# Patient Record
Sex: Male | Born: 1960 | Race: White | Hispanic: No | Marital: Married | Smoking: Former smoker
Health system: Southern US, Community
[De-identification: ages and names within clinical notes are randomized; demographics above are authoritative.]

## PROBLEM LIST (undated history)

## (undated) DIAGNOSIS — K219 Gastro-esophageal reflux disease without esophagitis: Secondary | ICD-10-CM

## (undated) DIAGNOSIS — R112 Nausea with vomiting, unspecified: Secondary | ICD-10-CM

## (undated) DIAGNOSIS — Z9889 Other specified postprocedural states: Secondary | ICD-10-CM

## (undated) DIAGNOSIS — G4730 Sleep apnea, unspecified: Secondary | ICD-10-CM

## (undated) DIAGNOSIS — E079 Disorder of thyroid, unspecified: Secondary | ICD-10-CM

## (undated) DIAGNOSIS — Z86010 Personal history of colonic polyps: Secondary | ICD-10-CM

## (undated) DIAGNOSIS — H04123 Dry eye syndrome of bilateral lacrimal glands: Secondary | ICD-10-CM

## (undated) DIAGNOSIS — I10 Essential (primary) hypertension: Secondary | ICD-10-CM

## (undated) DIAGNOSIS — H409 Unspecified glaucoma: Secondary | ICD-10-CM

## (undated) DIAGNOSIS — T7840XA Allergy, unspecified, initial encounter: Secondary | ICD-10-CM

## (undated) HISTORY — DX: Other specified postprocedural states: Z98.890

## (undated) HISTORY — PX: THYROID SURGERY: SHX805

## (undated) HISTORY — DX: Disorder of thyroid, unspecified: E07.9

## (undated) HISTORY — PX: POLYPECTOMY: SHX149

## (undated) HISTORY — PX: COLONOSCOPY: SHX174

## (undated) HISTORY — PX: SHOULDER SURGERY: SHX246

## (undated) HISTORY — DX: Essential (primary) hypertension: I10

## (undated) HISTORY — PX: EYE SURGERY: SHX253

## (undated) HISTORY — DX: Personal history of colonic polyps: Z86.010

## (undated) HISTORY — DX: Sleep apnea, unspecified: G47.30

## (undated) HISTORY — PX: CHOLECYSTECTOMY: SHX55

## (undated) HISTORY — DX: Nausea with vomiting, unspecified: R11.2

## (undated) HISTORY — PX: INNER EAR SURGERY: SHX679

## (undated) HISTORY — DX: Allergy, unspecified, initial encounter: T78.40XA

---

## 2003-07-26 ENCOUNTER — Encounter: Payer: Self-pay | Admitting: Family Medicine

## 2003-07-26 ENCOUNTER — Ambulatory Visit (HOSPITAL_COMMUNITY): Admission: RE | Admit: 2003-07-26 | Discharge: 2003-07-26 | Payer: Self-pay | Admitting: Family Medicine

## 2003-07-31 ENCOUNTER — Encounter: Payer: Self-pay | Admitting: Endocrinology

## 2003-07-31 ENCOUNTER — Ambulatory Visit (HOSPITAL_COMMUNITY): Admission: RE | Admit: 2003-07-31 | Discharge: 2003-07-31 | Payer: Self-pay | Admitting: Endocrinology

## 2003-09-06 ENCOUNTER — Observation Stay (HOSPITAL_COMMUNITY): Admission: RE | Admit: 2003-09-06 | Discharge: 2003-09-07 | Payer: Self-pay | Admitting: General Surgery

## 2006-07-01 ENCOUNTER — Ambulatory Visit: Payer: Self-pay | Admitting: Internal Medicine

## 2006-07-08 ENCOUNTER — Ambulatory Visit: Payer: Self-pay | Admitting: Internal Medicine

## 2006-07-08 DIAGNOSIS — Z86010 Personal history of colonic polyps: Secondary | ICD-10-CM

## 2006-07-08 DIAGNOSIS — Z860101 Personal history of adenomatous and serrated colon polyps: Secondary | ICD-10-CM | POA: Insufficient documentation

## 2006-07-08 HISTORY — DX: Personal history of adenomatous and serrated colon polyps: Z86.0101

## 2006-07-08 HISTORY — DX: Personal history of colonic polyps: Z86.010

## 2011-06-26 ENCOUNTER — Encounter: Payer: Self-pay | Admitting: Internal Medicine

## 2012-09-05 ENCOUNTER — Encounter: Payer: Self-pay | Admitting: Internal Medicine

## 2014-09-07 ENCOUNTER — Ambulatory Visit (INDEPENDENT_AMBULATORY_CARE_PROVIDER_SITE_OTHER): Payer: BC Managed Care – PPO | Admitting: Surgery

## 2014-10-11 ENCOUNTER — Ambulatory Visit (INDEPENDENT_AMBULATORY_CARE_PROVIDER_SITE_OTHER): Payer: BC Managed Care – PPO | Admitting: Surgery

## 2014-10-11 ENCOUNTER — Other Ambulatory Visit (INDEPENDENT_AMBULATORY_CARE_PROVIDER_SITE_OTHER): Payer: Self-pay | Admitting: Surgery

## 2014-10-24 ENCOUNTER — Encounter: Payer: BC Managed Care – PPO | Attending: Surgery | Admitting: Dietician

## 2014-10-24 VITALS — Wt 291.2 lb

## 2014-10-24 DIAGNOSIS — Z713 Dietary counseling and surveillance: Secondary | ICD-10-CM | POA: Insufficient documentation

## 2014-10-24 DIAGNOSIS — E669 Obesity, unspecified: Secondary | ICD-10-CM | POA: Insufficient documentation

## 2014-10-24 NOTE — Progress Notes (Signed)
  Supervised weight loss:  Appt start time: 210 end time:  225  SWL visit 1:  Primary concerns today:  Hayden Mcbride is here today for his 1st SWL visit out of 6 in preparation for bariatric surgery. The pre op goals and criteria for protein shakes was reviewed during the appointment today. The patient had several questions regarding preparing for surgery. He agrees to the following recommendations:  Plan: -Get into the habit of eating breakfast -Try some protein shakes -Review pre op goals  Weight: 291 lbs   MEDICATIONS: see list  Estimated energy needs: 1800-2000 calories  Progress Towards Goal(s):  In progress.   Nutritional Diagnosis:  -3.3 Overweight/obesity related to past poor dietary habits and physical inactivity as evidenced by patient in SWL for pending bariatric surgery following dietary guidelines for continued weight loss.     Intervention:  Nutrition counseling provided.  Handouts given during visit include:  Pre op goals  Protein shakes  Monitoring/Evaluation:  Dietary intake, exercise, and body weight in 4 week(s).

## 2014-10-24 NOTE — Patient Instructions (Signed)
-  Get into the habit of eating breakfast -Try some protein shakes -Review pre op goals

## 2014-11-17 ENCOUNTER — Encounter: Payer: Self-pay | Admitting: Dietician

## 2014-11-17 ENCOUNTER — Encounter: Payer: BC Managed Care – PPO | Attending: Surgery | Admitting: Dietician

## 2014-11-17 DIAGNOSIS — Z713 Dietary counseling and surveillance: Secondary | ICD-10-CM | POA: Diagnosis not present

## 2014-11-17 DIAGNOSIS — E669 Obesity, unspecified: Secondary | ICD-10-CM | POA: Insufficient documentation

## 2014-11-17 NOTE — Progress Notes (Signed)
  Pre-Op Assessment Visit:  Pre-Operative Sleeve or RYGB Surgery  Medical Nutrition Therapy:  Appt start time: 5643   End time:  1400.  Patient was seen on 11/17/2014 for Pre-Operative Nutrition Assessment. Assessment and letter of approval faxed to [facility] Bariatric Surgery Program coordinator on 11/17/2014.   Preferred Learning Style:   No preference indicated   Learning Readiness:   Contemplating  Handouts given during visit include:  Pre-Op Goals Bariatric Surgery Protein Shakes   During the appointment today the following Pre-Op Goals were reviewed with the patient: Maintain or lose weight as instructed by your surgeon Make healthy food choices Begin to limit portion sizes Limited concentrated sugars and fried foods Keep fat/sugar in the single digits per serving on   food labels Practice CHEWING your food  (aim for 30 chews per bite or until applesauce consistency) Practice not drinking 15 minutes before, during, and 30 minutes after each meal/snack Avoid all carbonated beverages  Avoid/limit caffeinated beverages  Avoid all sugar-sweetened beverages Consume 3 meals per day; eat every 3-5 hours Make a list of non-food related activities Aim for 64-100 ounces of FLUID daily  Aim for at least 60-80 grams of PROTEIN daily Look for a liquid protein source that contain ?15 g protein and ?5 g carbohydrate  (ex: shakes, drinks, shots)  Patient-Centered Goals: Kia would like to buy shirts that he likes in his size.  Zygmunt would like to be able to fit more comfortably in an airplane seat and be able to put the tray table down.   Mohmmad would like to be able to walk without feeling winded.   Branton feels that the Pre-Op goals are of a 3-4 level of importance today. He believes that he will find them more important as he gets closer to surgery. Carlis feels that about a level of 8 in confidence to be able to meet the Pre-Op goals.  Demonstrated degree of  understanding via:  Teach Back  Teaching Method Utilized:  Visual Auditory Hands on  Barriers to learning/adherence to lifestyle change: struggling to feel motivated to make changes to diet/lifestyle  Patient to [facility] the [facility] to enroll in Pre-Op and Post-Op Nutrition Education when surgery date is scheduled.

## 2014-11-17 NOTE — Patient Instructions (Signed)
Follow Pre-Op Goals Try Protein Shakes Attend 5 more months of supervised weight loss appointments.  Things to remember:  Please always be honest with [place]. We want to support you!  If you have any questions or concerns in between appointments, please call or email Ferol Luz, or Margarita Grizzle.  The diet after surgery will be high protein and low in carbohydrate.  Vitamins and calcium need to be taken for the rest of your life.  Feel free to include support people in any classes or appointments.

## 2014-11-21 ENCOUNTER — Encounter: Payer: BC Managed Care – PPO | Admitting: Dietician

## 2014-11-21 VITALS — Ht 72.0 in | Wt 292.0 lb

## 2014-11-21 DIAGNOSIS — E669 Obesity, unspecified: Secondary | ICD-10-CM

## 2014-11-21 NOTE — Patient Instructions (Addendum)
Work on not drinking while eating

## 2014-11-21 NOTE — Progress Notes (Signed)
  Supervised weight loss:  Appt start time: 210 end time:  225  SWL visit 2:  Primary concerns today:  Mr. Kimberling is here today for his 2nd SWL visit out of 6 in preparation for bariatric surgery. He has gained 1 pound since last visit. He reports that he has been limiting beer and being mindful about bringing "trigger" foods into the house. Also working on not eating in the evenings and trying to move more overall although he does not do any organized physical activity. He states that his weight varies drastically. He has been trying to eat "lighter." Still drinking diet soda. Plans to limit dessert at Christmastime.   Plan: -Work on not drinking while eating  Weight: 292 lbs BMI: 39.7   MEDICATIONS: see list  Estimated energy needs: 1800-2000 calories  Progress Towards Goal(s):  In progress.   Nutritional Diagnosis:  [place]-3.3 Overweight/obesity related to past poor dietary habits and physical inactivity as evidenced by patient in SWL for pending bariatric surgery following dietary guidelines for continued weight loss.     Intervention:  Nutrition counseling provided.  Handouts given during visit include: none at this visit  Monitoring/Evaluation:  Dietary intake, exercise, and body weight in 4 week(s).

## 2014-12-20 ENCOUNTER — Ambulatory Visit (HOSPITAL_COMMUNITY)
Admission: RE | Admit: 2014-12-20 | Discharge: 2014-12-20 | Disposition: A | Discharge status: Home / Self Care | Payer: BLUE CROSS/BLUE SHIELD | Source: Ambulatory Visit | Attending: Surgery | Admitting: Surgery

## 2014-12-20 ENCOUNTER — Encounter: Payer: BLUE CROSS/BLUE SHIELD | Attending: Surgery | Admitting: Dietician

## 2014-12-20 VITALS — Ht 72.0 in | Wt 293.2 lb

## 2014-12-20 DIAGNOSIS — Z9049 Acquired absence of other specified parts of digestive tract: Secondary | ICD-10-CM | POA: Diagnosis not present

## 2014-12-20 DIAGNOSIS — Z713 Dietary counseling and surveillance: Secondary | ICD-10-CM | POA: Diagnosis not present

## 2014-12-20 DIAGNOSIS — I4510 Unspecified right bundle-branch block: Secondary | ICD-10-CM | POA: Diagnosis not present

## 2014-12-20 DIAGNOSIS — Z01818 Encounter for other preprocedural examination: Secondary | ICD-10-CM | POA: Insufficient documentation

## 2014-12-20 DIAGNOSIS — K449 Diaphragmatic hernia without obstruction or gangrene: Secondary | ICD-10-CM | POA: Diagnosis not present

## 2014-12-20 DIAGNOSIS — E669 Obesity, unspecified: Secondary | ICD-10-CM

## 2014-12-20 IMAGING — RF DG UGI W/ KUB
13 series · 13 of 13 positions shown · non-contrast
Comparison: None.

CLINICAL DATA: Bariatric screening, prior cholecystectomy

EXAM:
UPPER GI SERIES WITH KUB
TECHNIQUE: After obtaining a scout radiograph a routine upper GI series was
performed using thin barium
FLUOROSCOPY TIME:  1 min 12 seconds

[Series 1: run · 1 of 1 slices shown (1 of 11)]
[im 1/1]
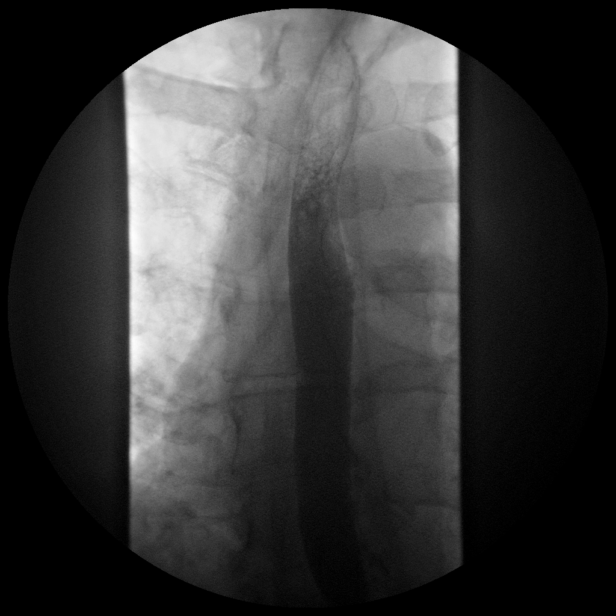

[Series 2: run · 1 of 1 slices shown (2 of 11)]
[im 1/1]
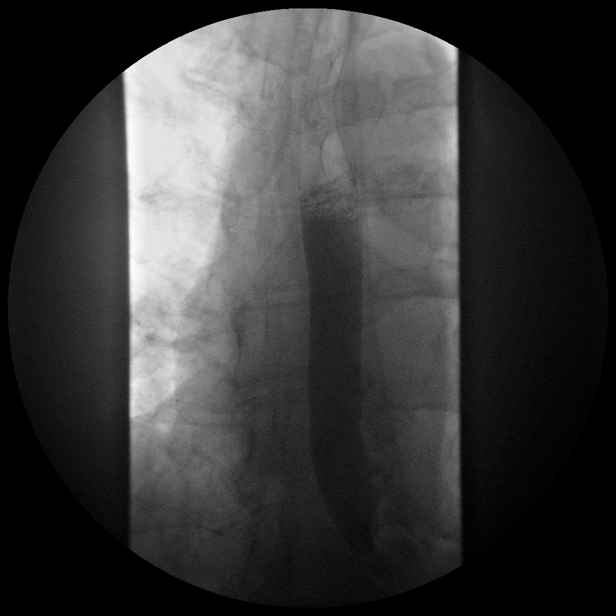

[Series 3: run · 1 of 1 slices shown (3 of 11)]
[im 1/1]
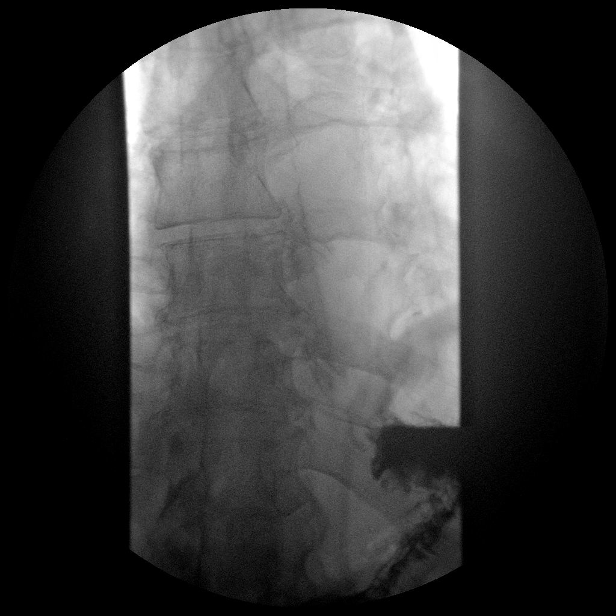

[Series 4: run · 1 of 1 slices shown (4 of 11)]
[im 1/1]
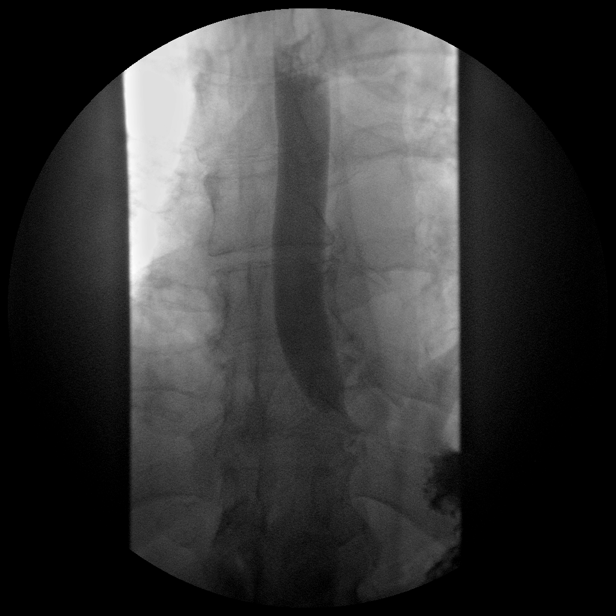

[Series 5: run · 1 of 1 slices shown (5 of 11)]
[im 1/1]
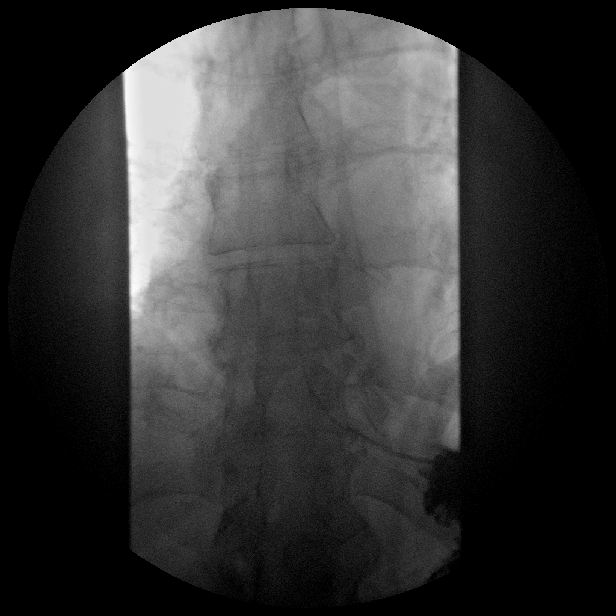

[Series 6: run · 1 of 1 slices shown (6 of 11)]
[im 1/1]
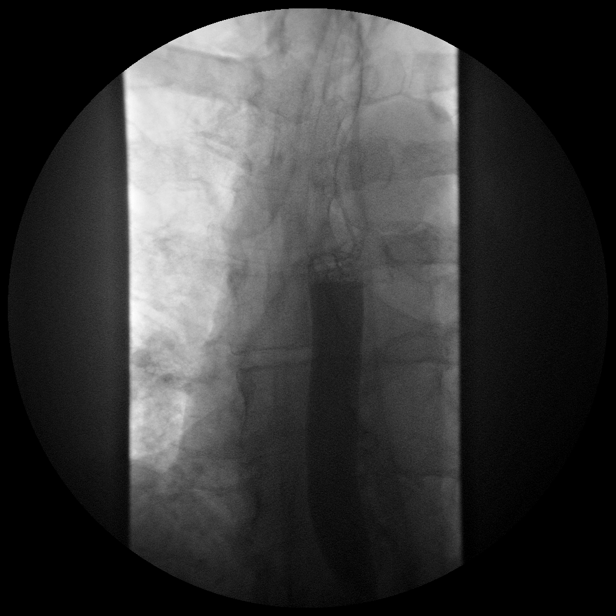

[Series 7: run · 1 of 1 slices shown (7 of 11)]
[im 1/1]
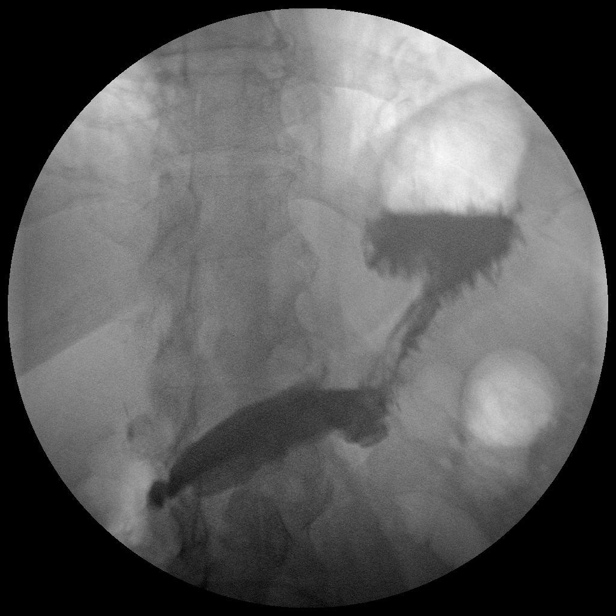

[Series 8: run · 1 of 1 slices shown (8 of 11)]
[im 1/1]
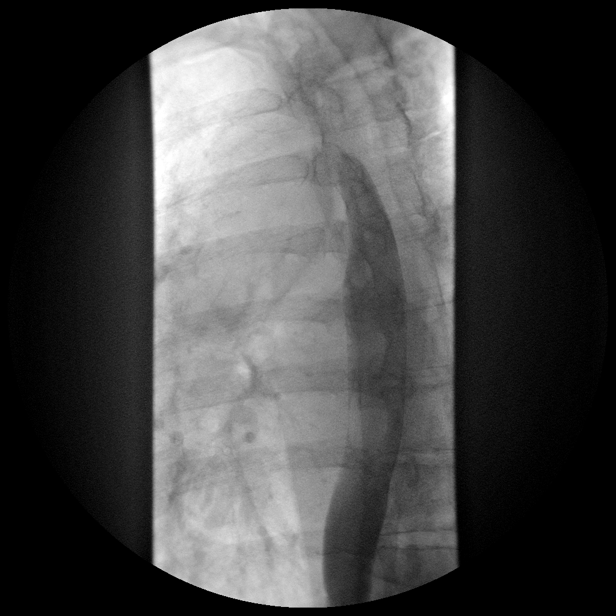

[Series 9: run · 1 of 1 slices shown (9 of 11)]
[im 1/1]
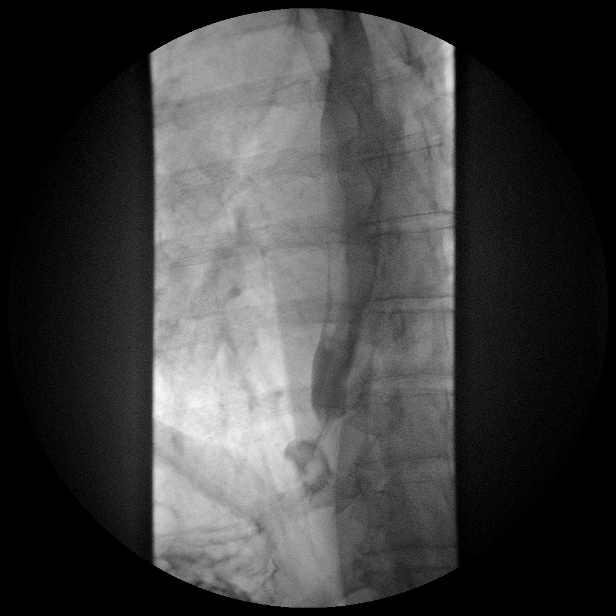

[Series 10: run · 1 of 1 slices shown (10 of 11)]
[im 1/1]
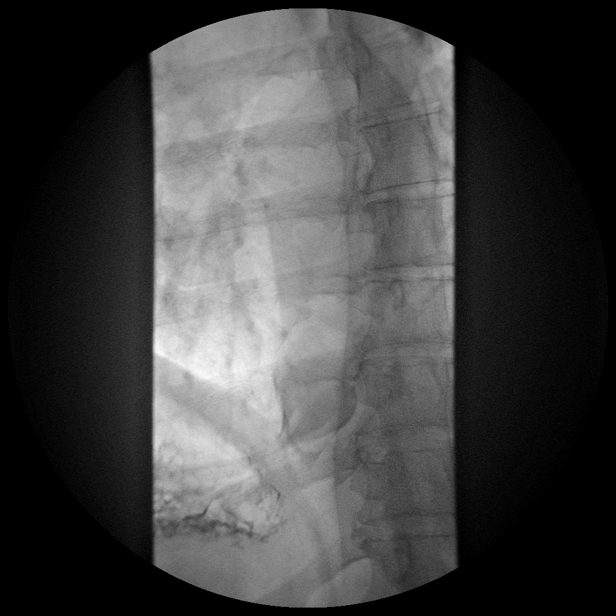

[Series 11: run · 1 of 1 slices shown (11 of 11)]
[im 1/1]
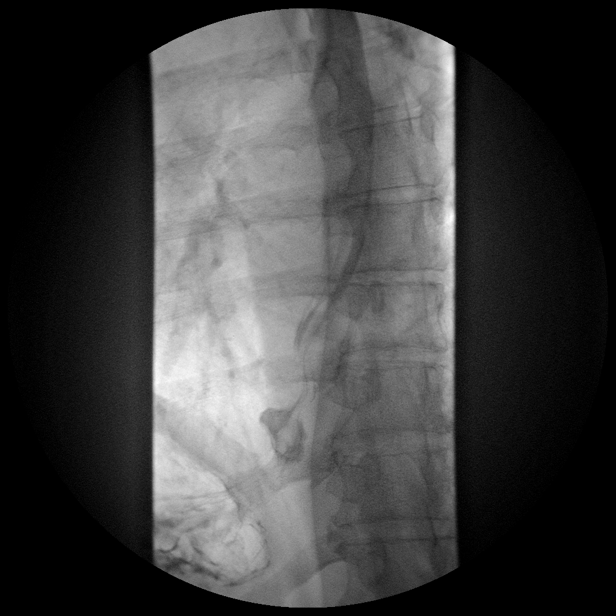

[Series 1001: view not recorded · 0.20mm/px · 1 of 1 slices shown (1 of 2)]
[im 1/1]
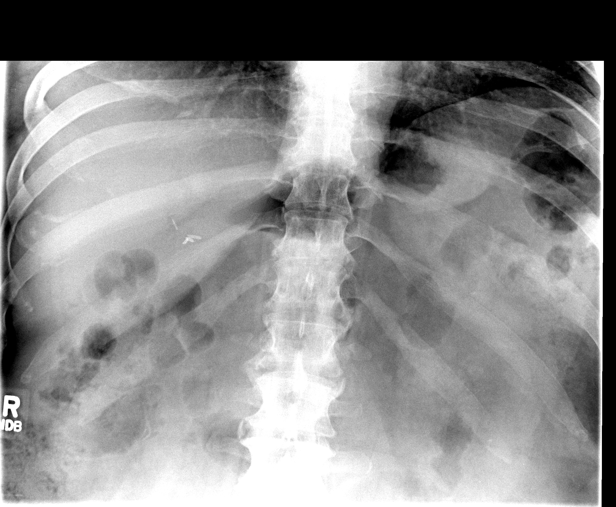

[Series 1002: view not recorded · 0.20mm/px · 1 of 1 slices shown (2 of 2)]
[im 1/1]
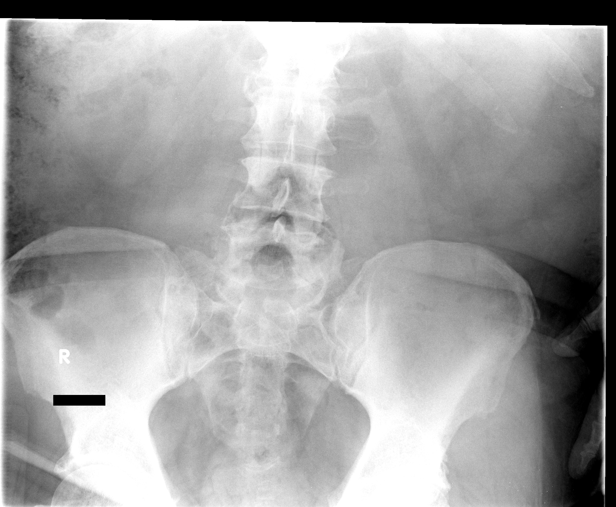

[13 of 13 positions shown; findings below may reference images not displayed]

FINDINGS: Scout radiograph demonstrates cholecystectomy clips.

Normal esophageal peristalsis. No fixed esophageal narrowing or
stricture.

Tiny hiatal hernia in the prone position. No gastroesophageal
reflux.

Normal gastric folds.
IMPRESSION: Tiny hiatal hernia.

Otherwise negative upper GI.

## 2014-12-20 NOTE — Progress Notes (Signed)
  Supervised weight loss:  Appt start time: 1120 end time:  1135  SWL visit 3:  Primary concerns today:  Hayden Mcbride is here today for his 3rd SWL visit out of 6 in preparation for bariatric surgery having gained 1 pound since last visit. He reports that he limited desserts at Christmastime and tried to get the sweets out of the house. He has been trying not to drink while eating and struggling to remember to avoid drinking with a meal. Tried EAS AdvantEdge protein shake and did not like it.   Plan: -Work on not drinking while eating -Try another approved protein shake  Weight: 293.2 lbs BMI: 39.8   MEDICATIONS: see list  Estimated energy needs: 1800-2000 calories  Progress Towards Goal(s):  In progress.   Nutritional Diagnosis:  [place]-3.3 Overweight/obesity related to past poor dietary habits and physical inactivity as evidenced by patient in SWL for pending bariatric surgery following dietary guidelines for continued weight loss.     Intervention:  Nutrition counseling provided.  Handouts given during visit include: none at this visit  Monitoring/Evaluation:  Dietary intake, exercise, and body weight in 4 week(s).

## 2014-12-24 ENCOUNTER — Ambulatory Visit (HOSPITAL_COMMUNITY)
Admission: RE | Admit: 2014-12-24 | Discharge: 2014-12-24 | Disposition: A | Discharge status: Home / Self Care | Payer: BLUE CROSS/BLUE SHIELD | Source: Ambulatory Visit | Attending: Surgery | Admitting: Surgery

## 2014-12-24 ENCOUNTER — Encounter (HOSPITAL_COMMUNITY)
Admission: RE | Disposition: A | Discharge status: Home / Self Care | Payer: BLUE CROSS/BLUE SHIELD | Source: Ambulatory Visit | Attending: Surgery

## 2014-12-24 HISTORY — PX: BREATH TEK H PYLORI: SHX5422

## 2014-12-24 SURGERY — BREATH TEST, FOR HELICOBACTER PYLORI

## 2014-12-24 NOTE — Progress Notes (Signed)
   12/24/14 Hayden Mcbride  Referring MD Dr Lucia Gaskins  Time of Last PO Intake 2200  Baseline Breath At: 0726  Pranactin Given At: 0729  Post-Dose Breath At: 0744  Sample 1 1.5%  Sample 2 2.4%  Test Negative

## 2014-12-25 ENCOUNTER — Encounter (HOSPITAL_COMMUNITY): Payer: Self-pay | Admitting: Surgery

## 2015-01-17 ENCOUNTER — Encounter: Payer: BLUE CROSS/BLUE SHIELD | Attending: Surgery | Admitting: Dietician

## 2015-01-17 VITALS — Ht 72.0 in | Wt 295.4 lb

## 2015-01-17 DIAGNOSIS — Z713 Dietary counseling and surveillance: Secondary | ICD-10-CM | POA: Insufficient documentation

## 2015-01-17 DIAGNOSIS — E669 Obesity, unspecified: Secondary | ICD-10-CM

## 2015-01-17 NOTE — Progress Notes (Signed)
  Supervised weight loss:  Appt start time: 510 end time:  525  SWL visit 4:  Primary concerns today:  Hayden Mcbride is here today for his 4th SWL visit out of 6 in preparation for bariatric surgery having gained a few pounds since last visit. He has not tried another protein shake yet. Has been working on not drinking while eating but finding it difficult. Overall he gets enough fluid in. Doesn't really like yogurt but will sometimes eat yogurt instead of dessert.   Plan: -Work on not drinking while eating -Try another approved protein shake  Weight: 295.4 lbs BMI: 40.1   MEDICATIONS: see list  Estimated energy needs: 1800-2000 calories  Progress Towards Goal(s):  In progress.   Nutritional Diagnosis:  Hayden Mcbride-3.3 Overweight/obesity related to past poor dietary habits and physical inactivity as evidenced by patient in SWL for pending bariatric surgery following dietary guidelines for continued weight loss.     Intervention:  Nutrition counseling provided.  Handouts given during visit include: none at this visit  Monitoring/Evaluation:  Dietary intake, exercise, and body weight in 4 week(s).

## 2015-02-14 ENCOUNTER — Encounter: Payer: BLUE CROSS/BLUE SHIELD | Attending: Surgery | Admitting: Dietician

## 2015-02-14 DIAGNOSIS — E669 Obesity, unspecified: Secondary | ICD-10-CM | POA: Insufficient documentation

## 2015-02-14 DIAGNOSIS — Z713 Dietary counseling and surveillance: Secondary | ICD-10-CM | POA: Insufficient documentation

## 2015-02-14 NOTE — Progress Notes (Signed)
  Supervised weight loss:  Appt start time: 505 end time:  520  SWL visit 5:  Primary concerns today:  Hayden Mcbride is here today for his 5th SWL visit out of 6 in preparation for bariatric surgery having gained 1 pound since last visit. He tried vanilla EAS AdvantEdge protein shake and liked it better than chocolate flavor. Still having a hard time not drinking while eating but he realizes that this is slowing him down.  Plan: -Keep practicing not drinking while eating  Weight: 296.8 lbs BMI: 40.3   MEDICATIONS: see list  Estimated energy needs: 1800-2000 calories  Progress Towards Goal(s):  In progress.   Nutritional Diagnosis:  [place]-3.3 Overweight/obesity related to past poor dietary habits and physical inactivity as evidenced by patient in SWL for pending bariatric surgery following dietary guidelines for continued weight loss.     Intervention:  Nutrition counseling provided.  Handouts given during visit include: none at this visit  Monitoring/Evaluation:  Dietary intake, exercise, and body weight in 4 week(s).

## 2015-02-27 ENCOUNTER — Ambulatory Visit (INDEPENDENT_AMBULATORY_CARE_PROVIDER_SITE_OTHER): Payer: BLUE CROSS/BLUE SHIELD | Admitting: Internal Medicine

## 2015-02-27 ENCOUNTER — Encounter: Payer: Self-pay | Admitting: Internal Medicine

## 2015-02-27 VITALS — BP 142/72 | HR 67 | Ht 71.0 in | Wt 295.1 lb

## 2015-02-27 DIAGNOSIS — I4510 Unspecified right bundle-branch block: Secondary | ICD-10-CM

## 2015-02-27 DIAGNOSIS — R079 Chest pain, unspecified: Secondary | ICD-10-CM

## 2015-02-27 DIAGNOSIS — R0600 Dyspnea, unspecified: Secondary | ICD-10-CM

## 2015-02-27 DIAGNOSIS — Z01810 Encounter for preprocedural cardiovascular examination: Secondary | ICD-10-CM

## 2015-02-27 NOTE — Patient Instructions (Signed)
Your physician has requested that you have an echocardiogram. Echocardiography is a painless test that uses sound waves to create images of your heart. It provides your doctor with information about the size and shape of your heart and how well your heart's chambers and valves are working. This procedure takes approximately one hour. There are no restrictions for this procedure.  Your physician has requested that you have an exercise stress myoview. For further information please visit [email or web address]. Please follow instruction sheet, as given.  Your physician recommends that you schedule a follow-up appointment after your tests.

## 2015-03-01 ENCOUNTER — Encounter: Payer: Self-pay | Admitting: Internal Medicine

## 2015-03-01 DIAGNOSIS — Z4659 Encounter for fitting and adjustment of other gastrointestinal appliance and device: Secondary | ICD-10-CM | POA: Insufficient documentation

## 2015-03-01 DIAGNOSIS — R079 Chest pain, unspecified: Secondary | ICD-10-CM | POA: Insufficient documentation

## 2015-03-01 DIAGNOSIS — I4510 Unspecified right bundle-branch block: Secondary | ICD-10-CM | POA: Insufficient documentation

## 2015-03-01 DIAGNOSIS — R0600 Dyspnea, unspecified: Secondary | ICD-10-CM | POA: Insufficient documentation

## 2015-03-01 DIAGNOSIS — Z01810 Encounter for preprocedural cardiovascular examination: Secondary | ICD-10-CM | POA: Insufficient documentation

## 2015-03-01 NOTE — Progress Notes (Signed)
OFFICE NOTE  Chief Complaint:  Chest pain, abnormal EKG, preoperative cardiac risk assessment  Primary Care Physician: Robyne Peers., MD  HPI:  Hayden Mcbride is a pleasant 54 year old morbidly obese male who presents for evaluation of an abnormal EKG and preoperative clearance. He is contemplating undergoing bariatric surgery. In fact is getting go through the surgery with his wife is also overweight. 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 He occasionally has some left shoulder pain and get some tingling in his chest, but no heaviness or substernal chest pain which we concerning for angina. He also has some lower extremity edema. He has a history of reflux as well as some knee and back pain for which he takes meloxicam. No significant family history of heart disease. He does have obstructive sleep apnea but reports a sleepiness score of 5. He uses CPAP and is compliant. He may have had obstructive sleep apnea for several years before being diagnosed.  PMHx:  Past Medical History  Diagnosis Date  . Hypertension   . Sleep apnea     Past Surgical History  Procedure Laterality Date  . Cholecystectomy    . Shoulder surgery    . Thyroid surgery    . Breath tek h pylori N/A 12/24/2014    Procedure: BREATH TEK H PYLORI;  Surgeon: Alphonsa Overall, MD;  Location: [facility];  Service: General;  Laterality: N/A;    FAMHx:  Family History  Problem Relation Age of Onset  . Cancer Maternal Grandmother     SOCHx:   reports that he quit smoking about 30 years ago. He has never used smokeless tobacco. He reports that he drinks about 3.6 oz of alcohol per week. He reports that he does not use illicit drugs.  ALLERGIES:  No Known Allergies  ROS: A comprehensive review of systems was negative except for: Musculoskeletal: positive for  arthralgias  HOME MEDS: Current Outpatient Prescriptions  Medication Sig Dispense Refill  . fluticasone (FLONASE) 50 MCG/ACT nasal spray as needed.    . meloxicam (MOBIC) 15 MG tablet Take 1 tablet by mouth daily.  0  . omeprazole (PRILOSEC) 20 MG capsule Take 20 mg by mouth daily.     No current facility-administered medications for this visit.    LABS/IMAGING: No results found for this or any previous visit (from the past 48 hour(s)). No results found.  VITALS: BP 142/72 mmHg  Pulse 67  Ht 5\' 11"  (1.803 m)  Wt 295 lb 1.6 oz (133.856 kg)  BMI 41.18 kg/m2  EXAM: General appearance: alert, no distress and morbidly obese Neck: no carotid bruit, no JVD and thyroid not enlarged, symmetric, no tenderness/mass/nodules Lungs: clear to auscultation bilaterally Heart: regular rate and rhythm, S1, S2 normal, no murmur, click, rub or gallop Abdomen: soft, non-tender; bowel sounds normal; no masses,  no organomegaly Extremities: extremities normal, atraumatic, no cyanosis or edema Pulses: 2+ and symmetric Skin: Skin color, texture, turgor normal. No rashes or lesions Neurologic: Grossly normal Psych: pleasant  EKG: Normal sinus rhythm, right bundle branch block, left anterior fascicular block at 67  ASSESSMENT: 1. Indeterminate preoperative risk 2. Abnormal EKG 3. Atypical chest pain 4. Morbid obesity 5. Dyspnea and exertion  PLAN: 1.   Mr. Negron has an abnormal EKG and is describing what sounds like atypical chest pain. He reports some shortness of breath with exertion. This may be related  to his weight, however it would be reasonable to consider ruling out structural abnormalities to the heart. I like for him to undergo exercise stress testing with Myoview perfusion, due to his abnormal right bundle branch block and left anterior fascicular block which would make interpreting an EKG quite difficult for ischemia. It will be helpful to gauge his exercise capacity, though. In  addition, recommend an echocardiogram given his abnormality which may be due to right heart enlargement or sequelae of upper airway resistance and long-standing obstructive sleep apnea. Plan to see him back to discuss results of the studies and I can better assess his risk for bariatric surgery.  Thanks for the kind referral.  Pixie Casino, MD, [facility] Attending Cardiologist [facility]  Mescal Flinchbaugh C 03/01/2015, 8:52 AM

## 2015-03-14 ENCOUNTER — Telehealth (HOSPITAL_COMMUNITY): Payer: Self-pay

## 2015-03-14 NOTE — Telephone Encounter (Signed)
Encounter complete. 

## 2015-03-18 ENCOUNTER — Encounter: Payer: BLUE CROSS/BLUE SHIELD | Attending: Surgery | Admitting: Dietician

## 2015-03-18 DIAGNOSIS — E669 Obesity, unspecified: Secondary | ICD-10-CM | POA: Diagnosis not present

## 2015-03-18 DIAGNOSIS — Z713 Dietary counseling and surveillance: Secondary | ICD-10-CM | POA: Insufficient documentation

## 2015-03-18 NOTE — Progress Notes (Signed)
  Supervised weight loss:  Appt start time: 510 end time:  525  SWL visit 6:  Primary concerns today:  [name] is here today for his 5th SWL visit out of 6 in preparation for bariatric surgery having lost 3 pounds since last visit. Has been working on not drinking while eating. Recently visited a cardiologist for diagnosis of RBBB and has since been focusing more on heart health.   Plan: -Keep practicing not drinking while eating  Weight: 293.2 lbs BMI: 39.8   MEDICATIONS: see list  Estimated energy needs: 1800-2000 calories  Progress Towards Goal(s):  In progress.   Nutritional Diagnosis:  [place]-3.3 Overweight/obesity related to past poor dietary habits and physical inactivity as evidenced by patient in SWL for pending bariatric surgery following dietary guidelines for continued weight loss.     Intervention:  Nutrition counseling provided.  Handouts given during visit include: none at this visit  Monitoring/Evaluation:  Dietary intake, exercise, and body weight in 4 week(s).

## 2015-03-19 ENCOUNTER — Ambulatory Visit (HOSPITAL_COMMUNITY)
Admission: RE | Admit: 2015-03-19 | Discharge: 2015-03-19 | Disposition: A | Discharge status: Home / Self Care | Payer: BLUE CROSS/BLUE SHIELD | Source: Ambulatory Visit | Attending: Cardiology | Admitting: Cardiology

## 2015-03-19 ENCOUNTER — Ambulatory Visit: Payer: BLUE CROSS/BLUE SHIELD | Admitting: Cardiology

## 2015-03-19 ENCOUNTER — Ambulatory Visit (HOSPITAL_BASED_OUTPATIENT_CLINIC_OR_DEPARTMENT_OTHER)
Admission: RE | Admit: 2015-03-19 | Discharge: 2015-03-19 | Disposition: A | Discharge status: Home / Self Care | Payer: BLUE CROSS/BLUE SHIELD | Source: Ambulatory Visit | Attending: Internal Medicine | Admitting: Internal Medicine

## 2015-03-19 DIAGNOSIS — E669 Obesity, unspecified: Secondary | ICD-10-CM | POA: Insufficient documentation

## 2015-03-19 DIAGNOSIS — R0609 Other forms of dyspnea: Secondary | ICD-10-CM | POA: Diagnosis not present

## 2015-03-19 DIAGNOSIS — R079 Chest pain, unspecified: Secondary | ICD-10-CM | POA: Insufficient documentation

## 2015-03-19 DIAGNOSIS — Z6841 Body Mass Index (BMI) 40.0 and over, adult: Secondary | ICD-10-CM | POA: Diagnosis not present

## 2015-03-19 DIAGNOSIS — I10 Essential (primary) hypertension: Secondary | ICD-10-CM | POA: Diagnosis not present

## 2015-03-19 DIAGNOSIS — Z01810 Encounter for preprocedural cardiovascular examination: Secondary | ICD-10-CM

## 2015-03-19 DIAGNOSIS — I4510 Unspecified right bundle-branch block: Secondary | ICD-10-CM | POA: Insufficient documentation

## 2015-03-19 DIAGNOSIS — R0600 Dyspnea, unspecified: Secondary | ICD-10-CM

## 2015-03-19 MED ORDER — TECHNETIUM TC 99M SESTAMIBI GENERIC - CARDIOLITE
30.8000 | Freq: Once | INTRAVENOUS | Status: AC | PRN
  Administered 2015-03-19: 30.8 via INTRAVENOUS

## 2015-03-19 NOTE — Progress Notes (Signed)
2D Echo Performed 03/19/2015    Marygrace Drought, RCS

## 2015-03-19 NOTE — Procedures (Addendum)
[name] [address] [place] [address] [phone]  Cardiology Nuclear Med Study  Hayden Mcbride is a 54 y.o. male     MRN : [id number]     DOB: 18-May-1961  Procedure Date: 03/19/2015  Nuclear Med Background Indication for Stress Test:  Surgical Clearance History:  Abnormal EKG;RBBB and LAFB Cardiac Risk Factors: Hypertension, Obesity and RBBB  Symptoms:  Chest Pain and DOE   Nuclear Pre-Procedure Caffeine/Decaff Intake:  9:00pm NPO After: 5:00am   IV Site: R Antecubital  IV 0.9% NS with Angio Cath:  22g  Chest Size (in):  XXL IV Started by: Otho Perl, CNMT  Height: 5\' 11"  (1.803 m)  Cup Size: n/a  BMI:  Body mass index is 41.16 kg/(m^2). Weight:  295 lb (133.811 kg)   Tech Comments:  n/a    Nuclear Med Study 1 or 2 day study: 2 day  Stress Test Type:  Stress  Order Authorizing Provider:  Lyman Bishop, MD   Resting Radionuclide: Technetium 33m Sestamibi  Resting Radionuclide Dose: 31.8 mCi   Stress Radionuclide:  Technetium 32m Sestamibi  Stress Radionuclide Dose: 30.8 mCi           Stress Protocol Rest HR: 56 Stress HR: 144  Rest BP: 164/95 Stress BP: 194/92  Exercise Time (min): 5:46 METS: 7.00   Predicted Max HR: 166 bpm % Max HR: 86.75 bpm Rate Pressure Product: [phone]  Dose of Adenosine (mg):  n/a Dose of Lexiscan: n/a mg  Dose of Atropine (mg): n/a Dose of Dobutamine: n/a mcg/kg/min (at max HR)  Stress Test Technologist: Mellody Memos, CCT Nuclear Technologist: ElizabethYoung,CNMT   Rest Procedure:  Myocardial perfusion imaging was performed at rest 45 minutes following the intravenous administration of Technetium 2m Sestamibi. Stress Procedure:  The patient performed treadmill exercise using a Bruce  Protocol for 5 minutes 46 seconds. The patient stopped due to extreme shortness of breath with exertion and fatigue. Patient denied any chest pain.  There were significant ST-T wave changes.   Technetium 33m Sestamibi was injected IV at peak exercise and myocardial perfusion imaging was performed after a brief delay.  Transient Ischemic Dilatation (Normal <1.22):  0.96  QGS EDV:  157 ml QGS ESV:  76 ml LV Ejection Fraction: 52%      Rest ECG:  Standing: NSR with T wave inversion inferolaterally; with sitting pt deveolped RBBB  Stress ECG: RBBB with repolarizatin throughout the protocol without ischemic changes  QPS Raw Data Images:  Normal; no motion artifact; normal heart/lung ratio. Stress Images:  Normal homogeneous uptake in all areas of the myocardium. Rest Images:  Normal homogeneous uptake in all areas of the myocardium. Subtraction (SDS):  Normal  Impression Exercise Capacity:  Fair exercise capacity. BP Response:  Normal blood pressure response. Clinical Symptoms:  Mild shortness of breath ECG Impression:  No significant ST segment change suggestive of ischemia. Comparison with Prior Nuclear Study: No images to compare  Overall Impression:  Low risk stress nuclear study with patient developing RBBB at the start of the protocol with normal scitigraphic images.  LV Wall Motion:  NL LV Function, EF 52%; NL Wall Motion   Gurvir Schrom A, MD  03/20/2015 12:47 PM

## 2015-03-20 ENCOUNTER — Ambulatory Visit (HOSPITAL_COMMUNITY)
Admission: RE | Admit: 2015-03-20 | Discharge: 2015-03-20 | Disposition: A | Discharge status: Home / Self Care | Payer: BLUE CROSS/BLUE SHIELD | Source: Ambulatory Visit | Attending: Cardiovascular Disease | Admitting: Cardiovascular Disease

## 2015-03-20 DIAGNOSIS — R079 Chest pain, unspecified: Secondary | ICD-10-CM | POA: Diagnosis not present

## 2015-03-20 DIAGNOSIS — I452 Bifascicular block: Secondary | ICD-10-CM | POA: Diagnosis not present

## 2015-03-20 DIAGNOSIS — Z6841 Body Mass Index (BMI) 40.0 and over, adult: Secondary | ICD-10-CM | POA: Diagnosis not present

## 2015-03-20 DIAGNOSIS — I10 Essential (primary) hypertension: Secondary | ICD-10-CM | POA: Insufficient documentation

## 2015-03-20 DIAGNOSIS — E669 Obesity, unspecified: Secondary | ICD-10-CM | POA: Diagnosis not present

## 2015-03-20 DIAGNOSIS — Z01810 Encounter for preprocedural cardiovascular examination: Secondary | ICD-10-CM | POA: Diagnosis not present

## 2015-03-20 DIAGNOSIS — R0600 Dyspnea, unspecified: Secondary | ICD-10-CM | POA: Insufficient documentation

## 2015-03-20 MED ORDER — TECHNETIUM TC 99M SESTAMIBI GENERIC - CARDIOLITE
31.8000 | Freq: Once | INTRAVENOUS | Status: AC | PRN
  Administered 2015-03-20: 31.8 via INTRAVENOUS

## 2015-04-08 ENCOUNTER — Ambulatory Visit (INDEPENDENT_AMBULATORY_CARE_PROVIDER_SITE_OTHER): Payer: BLUE CROSS/BLUE SHIELD | Admitting: Internal Medicine

## 2015-04-08 ENCOUNTER — Encounter: Payer: Self-pay | Admitting: Internal Medicine

## 2015-04-08 VITALS — BP 151/91 | HR 58 | Ht 71.0 in | Wt 294.0 lb

## 2015-04-08 DIAGNOSIS — I4510 Unspecified right bundle-branch block: Secondary | ICD-10-CM

## 2015-04-08 DIAGNOSIS — R0600 Dyspnea, unspecified: Secondary | ICD-10-CM

## 2015-04-08 DIAGNOSIS — Z01810 Encounter for preprocedural cardiovascular examination: Secondary | ICD-10-CM | POA: Diagnosis not present

## 2015-04-08 NOTE — Progress Notes (Signed)
OFFICE NOTE  Chief Complaint:  Follow-up tests  Primary Care Physician: Robyne Peers., MD  HPI:  Hayden Mcbride is a pleasant 54 year old morbidly obese male who presents for evaluation of an abnormal EKG and preoperative clearance. He is contemplating undergoing bariatric surgery. In fact is getting go through the surgery with his wife is also overweight. 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 He occasionally has some left shoulder pain and get some tingling in his chest, but no heaviness or substernal chest pain which we concerning for angina. He also has some lower extremity edema. He has a history of reflux as well as some knee and back pain for which he takes meloxicam. No significant family history of heart disease. He does have obstructive sleep apnea but reports a sleepiness score of 5. He uses CPAP and is compliant. He may have had obstructive sleep apnea for several years before being diagnosed.  Hayden Mcbride returns today for follow-up. He underwent an echocardiogram which shows normal ejection fraction and no significant valvular disease. There was mild left atrial enlargement. He also underwent a nuclear stress test which is a 2 day study. This is negative for ischemia with a preserved EF of 52%. A right bundle branch block was noted.  PMHx:  Past Medical History  Diagnosis Date  . Hypertension   . Sleep apnea     Past Surgical History  Procedure Laterality Date  . Cholecystectomy    . Shoulder surgery    . Thyroid surgery    . Breath tek h pylori N/A 12/24/2014    Procedure: BREATH TEK H PYLORI;  Surgeon: Alphonsa Overall, MD;  Location: [facility];  Service: General;  Laterality: N/A;    FAMHx:  Family History  Problem Relation Age of Onset  . Cancer Maternal Grandmother     SOCHx:   reports that he quit smoking about 30  years ago. He has never used smokeless tobacco. He reports that he drinks about 3.6 oz of alcohol per week. He reports that he does not use illicit drugs.  ALLERGIES:  No Known Allergies  ROS: A comprehensive review of systems was negative except for: Musculoskeletal: positive for arthralgias  HOME MEDS: Current Outpatient Prescriptions  Medication Sig Dispense Refill  . fluticasone (FLONASE) 50 MCG/ACT nasal spray as needed.    . meloxicam (MOBIC) 15 MG tablet Take 1 tablet by mouth daily.  0  . omeprazole (PRILOSEC) 20 MG capsule Take 20 mg by mouth daily.     No current facility-administered medications for this visit.    LABS/IMAGING: No results found for this or any previous visit (from the past 48 hour(s)). No results found.  VITALS: BP 151/91 mmHg  Pulse 58  Ht 5\' 11"  (1.803 m)  Wt 294 lb (133.358 kg)  BMI 41.02 kg/m2  EXAM: Deferred  EKG: Deferred  ASSESSMENT: 1. Low preoperative risk - negative nuclear stress test for ischemia 2. Abnormal EKG 3. Atypical chest pain 4. Morbid obesity 5. Dyspnea and exertion 6. RBBB  PLAN: 1.   Hayden Mcbride has an abnormal EKG demonstrating right bundle branch block. This was a little more pronounced during exercise. It may be rate related. His stress test was negative for ischemia and EF looks normal on echo. I think he is at low risk for upcoming bariatric surgery. Follow-up with me can be on an as-needed basis.  Thanks for the kind referral.  Pixie Casino, MD, [facility] Attending Cardiologist [facility]  Lan Entsminger C 04/08/2015, 4:45 PM

## 2015-04-08 NOTE — Patient Instructions (Signed)
Your physician recommends that you schedule a follow-up appointment as needed  

## 2015-04-16 ENCOUNTER — Ambulatory Visit (AMBULATORY_SURGERY_CENTER): Payer: Self-pay

## 2015-04-16 ENCOUNTER — Ambulatory Visit: Payer: BLUE CROSS/BLUE SHIELD | Admitting: Dietician

## 2015-04-16 VITALS — Ht 71.0 in | Wt 284.8 lb

## 2015-04-16 DIAGNOSIS — Z86010 Personal history of colonic polyps: Secondary | ICD-10-CM

## 2015-04-16 DIAGNOSIS — Z800 Family history of malignant neoplasm of digestive organs: Secondary | ICD-10-CM

## 2015-04-16 NOTE — Progress Notes (Signed)
Per pt, no allergies to soy or egg products.Pt not taking any weight loss meds or using  O2 at home. 

## 2015-04-17 ENCOUNTER — Ambulatory Visit: Payer: BLUE CROSS/BLUE SHIELD | Admitting: Dietician

## 2015-04-19 ENCOUNTER — Ambulatory Visit (AMBULATORY_SURGERY_CENTER): Payer: BLUE CROSS/BLUE SHIELD | Admitting: Internal Medicine

## 2015-04-19 ENCOUNTER — Encounter: Payer: Self-pay | Admitting: Internal Medicine

## 2015-04-19 ENCOUNTER — Other Ambulatory Visit: Payer: Self-pay | Admitting: Internal Medicine

## 2015-04-19 VITALS — BP 123/85 | HR 54 | Temp 97.9°F | Resp 29 | Ht 71.0 in | Wt 294.0 lb

## 2015-04-19 DIAGNOSIS — Z86010 Personal history of colonic polyps: Secondary | ICD-10-CM

## 2015-04-19 DIAGNOSIS — D123 Benign neoplasm of transverse colon: Secondary | ICD-10-CM | POA: Diagnosis not present

## 2015-04-19 DIAGNOSIS — D128 Benign neoplasm of rectum: Secondary | ICD-10-CM

## 2015-04-19 MED ORDER — SODIUM CHLORIDE 0.9 % IV SOLN: 500.0000 mL | INTRAVENOUS | Status: DC

## 2015-04-19 NOTE — Progress Notes (Signed)
Report to PACU, RN, vss, BBS= Clear.  

## 2015-04-19 NOTE — Op Note (Signed)
[facility]  [employer] [address] [place], [address]   COLONOSCOPY PROCEDURE REPORT  PATIENT: Hayden Mcbride, [place]  MR#: [id number] BIRTHDATE: 1961/11/11 , 89  yrs. old GENDER: male ENDOSCOPIST: Gatha Mayer, MD, [facility] PROCEDURE DATE:  04/19/2015 PROCEDURE:   Colonoscopy, surveillance and Colonoscopy with snare polypectomy First Screening Colonoscopy - Avg.  risk and is 50 yrs.  old or older - No.  Prior Negative Screening - Now for repeat screening. N/A  History of Adenoma - Now for follow-up colonoscopy & has been > or = to 3 yrs.  Yes hx of adenoma.  Has been 3 or more years since last colonoscopy.  Polyps Removed Today ASA CLASS:   Class II INDICATIONS:Surveillance due to prior colonic neoplasia, PH Colon Adenoma, and FH Colon or Rectal Adenocarcinoma. MEDICATIONS: Propofol 250 mg IV, Monitored anesthesia care, and Lidocaine 200 mg IV  DESCRIPTION OF PROCEDURE:   After the risks benefits and alternatives of the procedure were thoroughly explained, informed consent was obtained.  The digital rectal exam revealed no abnormalities of the rectum, revealed the prostate was not enlarged, and revealed no prostatic nodules.   The LB ER-[id number]  endoscope was introduced through the anus and advanced to the cecum, which was identified by both the appendix and ileocecal valve. No adverse events experienced.   The quality of the prep was excellent.  (MiraLax was used)  The instrument was then slowly withdrawn as the colon was fully examined.   COLON FINDINGS: Two sessile polyps ranging from 3 to 41mm in size were found in the rectum and transverse colon.  Polypectomies were performed with a cold snare.  The resection was complete, the polyp tissue was partially retrieved and sent to histology.   There was moderate diverticulosis noted in the left colon.   The examination was otherwise normal.  Retroflexed views revealed no abnormalities. The time to cecum = 3.1 Withdrawal  time = 16.3   The scope was withdrawn and the procedure completed. COMPLICATIONS: There were no immediate complications.  ENDOSCOPIC IMPRESSION: 1.   Two sessile polyps ranging from 3 to 38mm in size were found in the rectum and transverse colon; polypectomies were performed with a cold snare Rectal polyp not recovered. 2.   Moderate diverticulosis was noted in the left colon 3.   The examination was otherwise normal - excellent prep - hx adenoma 2007, FHx Colon cancer  RECOMMENDATIONS: Timing of repeat colonoscopy will be determined by pathology findings.  eSigned:  Gatha Mayer, MD, [facility] 04/19/2015 12:09 PM   cc: Rolan Lipa, MD  The Patient

## 2015-04-19 NOTE — Patient Instructions (Addendum)
I found and removed 2 small polyps that look benign. One was recovered one not.  You still have a condition called diverticulosis - common and not usually a problem. Please read the handout provided.  I will let you know pathology results and when to have another routine colonoscopy by mail. Probably about 5 years though 8-9 years this last time was ok.  HAPPY BIRTHDAY!  I appreciate the opportunity to care for you. Gatha Mayer, MD, FACG   YOU HAD AN ENDOSCOPIC PROCEDURE TODAY AT [facility]:   Refer to the procedure report that was given to you for any specific questions about what was found during the examination.  If the procedure report does not answer your questions, please call your gastroenterologist to clarify.  If you requested that your care partner not be given the details of your procedure findings, then the procedure report has been included in a sealed envelope for you to review at your convenience later.  YOU SHOULD EXPECT: Some feelings of bloating in the abdomen. Passage of more gas than usual.  Walking can help get rid of the air that was put into your GI tract during the procedure and reduce the bloating. If you had a lower endoscopy (such as a colonoscopy or flexible sigmoidoscopy) you may notice spotting of blood in your stool or on the toilet paper. If you underwent a bowel prep for your procedure, you may not have a normal bowel movement for a few days.  Please Note:  You might notice some irritation and congestion in your nose or some drainage.  This is from the oxygen used during your procedure.  There is no need for concern and it should clear up in a day or so.  SYMPTOMS TO REPORT IMMEDIATELY:   Following lower endoscopy (colonoscopy or flexible sigmoidoscopy):  Excessive amounts of blood in the stool  Significant tenderness or worsening of abdominal pains  Swelling of the abdomen that is new, acute  Fever of 100F or higher  For  urgent or emergent issues, a gastroenterologist can be reached at any hour by calling [phone].  DIET: Your first meal following the procedure should be a small meal and then it is ok to progress to your normal diet. Heavy or fried foods are harder to digest and may make you feel nauseous or bloated.  Likewise, meals heavy in dairy and vegetables can increase bloating.  Drink plenty of fluids but you should avoid alcoholic beverages for 24 hours.  ACTIVITY:  You should plan to take it easy for the rest of today and you should NOT DRIVE or use heavy machinery until tomorrow (because of the sedation medicines used during the test).    FOLLOW UP: Our staff will call the number listed on your records the next business day following your procedure to check on you and address any questions or concerns that you may have regarding the information given to you following your procedure. If we do not reach you, we will leave a message.  However, if you are feeling well and you are not experiencing any problems, there is no need to return our call.  We will assume that you have returned to your regular daily activities without incident.  If any biopsies were taken you will be contacted by phone or by letter within the next 1-3 weeks.  Please call us at [phone] if you have not heard about the biopsies in 3 weeks.   SIGNATURES/CONFIDENTIALITY:  You and/or your care partner have signed paperwork which will be entered into your electronic medical record.  These signatures attest to the fact that that the information above on your After Visit Summary has been reviewed and is understood.  Full responsibility of the confidentiality of this discharge information lies with you and/or your care-partner.  Please continue your normal medications  Please read over handouts about polyps and diverticulosis

## 2015-04-19 NOTE — Progress Notes (Signed)
Called to room to assist during endoscopic procedure.  Patient ID and intended procedure confirmed with present staff. Received instructions for my participation in the procedure from the performing physician.  

## 2015-04-22 ENCOUNTER — Telehealth: Payer: Self-pay | Admitting: *Deleted

## 2015-04-22 NOTE — Telephone Encounter (Signed)
  Follow up Call-  Call back number 04/19/2015  Post procedure Call Back phone  # [phone]  Permission to leave phone message Yes     Patient questions:  Message left to call us if necessary.

## 2015-04-24 ENCOUNTER — Encounter: Payer: Self-pay | Admitting: Internal Medicine

## 2015-04-24 DIAGNOSIS — Z800 Family history of malignant neoplasm of digestive organs: Secondary | ICD-10-CM | POA: Insufficient documentation

## 2015-04-24 DIAGNOSIS — Z860101 Personal history of adenomatous and serrated colon polyps: Secondary | ICD-10-CM

## 2015-04-24 DIAGNOSIS — Z86010 Personal history of colonic polyps: Secondary | ICD-10-CM

## 2015-04-24 NOTE — Progress Notes (Signed)
Quick Note:  Adenoma (diminutive) and 1 lost - repeat colonoscopy 5 yrs (also FHx) Repeat colonoscopy 2021 ______

## 2015-05-17 ENCOUNTER — Encounter: Payer: BLUE CROSS/BLUE SHIELD | Admitting: Internal Medicine

## 2019-08-27 ENCOUNTER — Encounter (HOSPITAL_BASED_OUTPATIENT_CLINIC_OR_DEPARTMENT_OTHER): Payer: Self-pay

## 2019-08-27 ENCOUNTER — Other Ambulatory Visit: Payer: Self-pay

## 2019-08-27 ENCOUNTER — Emergency Department (HOSPITAL_BASED_OUTPATIENT_CLINIC_OR_DEPARTMENT_OTHER)
Admission: EM | Admit: 2019-08-27 | Discharge: 2019-08-27 | Disposition: A | Discharge status: Home / Self Care | Payer: 59 | Attending: Emergency Medicine | Admitting: Emergency Medicine

## 2019-08-27 DIAGNOSIS — Z79899 Other long term (current) drug therapy: Secondary | ICD-10-CM | POA: Insufficient documentation

## 2019-08-27 DIAGNOSIS — Z87891 Personal history of nicotine dependence: Secondary | ICD-10-CM | POA: Diagnosis not present

## 2019-08-27 DIAGNOSIS — L03115 Cellulitis of right lower limb: Secondary | ICD-10-CM | POA: Insufficient documentation

## 2019-08-27 DIAGNOSIS — I10 Essential (primary) hypertension: Secondary | ICD-10-CM | POA: Insufficient documentation

## 2019-08-27 HISTORY — DX: Dry eye syndrome of bilateral lacrimal glands: H04.123

## 2019-08-27 HISTORY — DX: Gastro-esophageal reflux disease without esophagitis: K21.9

## 2019-08-27 HISTORY — DX: Unspecified glaucoma: H40.9

## 2019-08-27 MED ORDER — CLINDAMYCIN PHOSPHATE 600 MG/50ML IV SOLN
600.0000 mg | Freq: Once | INTRAVENOUS | Status: AC
  Administered 2019-08-27: 01:00:00 600 mg via INTRAVENOUS
  Filled 2019-08-27: qty 50

## 2019-08-27 MED ORDER — LIDOCAINE HCL (PF) 1 % IJ SOLN
5.0000 mL | Freq: Once | INTRAMUSCULAR | Status: DC
  Filled 2019-08-27: qty 5

## 2019-08-27 NOTE — Discharge Instructions (Addendum)
Continue clindamycin as previously prescribed.  Return to the emergency department for increased redness, high fever, streaks up and down the leg, or other new and concerning symptoms.

## 2019-08-27 NOTE — ED Provider Notes (Signed)
[facility] EMERGENCY DEPARTMENT Provider Note   CSN: [id number] Arrival date & time: 08/27/19  0029     History   Chief Complaint Chief Complaint  Patient presents with  . Cellulitis    HPI TAVYN FLUITT is a 58 y.o. male.     Patient is a 58 year old male with history of hypertension, GERD.  He presents today for evaluation of pain, redness, and swelling to the right lateral lower leg.  This is been present for the past 2 days.  He was initially seen at urgent care on Friday and prescribed clindamycin.  He has had a total of 6 doses of this, but the redness appears to be extending beyond the line that was drawn on his skin to demarcate the erythema versus normal tissue by the providers at urgent care.  He denies any fevers or chills.  He denies any definite insect bites or stings.  The history is provided by the patient.    Past Medical History:  Diagnosis Date  . Dry eyes   . GERD (gastroesophageal reflux disease)   . Glaucoma   . Hx of adenomatous colonic polyps 07/08/2006   06/2006 - diminutive adenoma 04/19/2015 - diminutive adenoma and one polyp lost - repeat colonoscopy 2021  . Hypertension   . Post-operative nausea and vomiting   . Sleep apnea    wears CPAP    Patient Active Problem List   Diagnosis Date Noted  . Family history of colon cancer 04/24/2015  . Dyspnea 03/01/2015  . RBBB 03/01/2015  . Preoperative cardiovascular examination 03/01/2015  . Pain in the chest 03/01/2015  . Morbid obesity ([place]) 03/01/2015  . Hx of adenomatous colonic polyps 07/08/2006    Past Surgical History:  Procedure Laterality Date  . BREATH TEK H PYLORI N/A 12/24/2014   Procedure: BREATH TEK H PYLORI;  Surgeon: Alphonsa Overall, MD;  Location: [facility];  Service: General;  Laterality: N/A;  . CHOLECYSTECTOMY    . 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 SHOULDER SURGERY     left shoulder  . THYROID SURGERY          Home Medications    Prior to  Admission medications   Medication Sig Start Date End Date Taking? Authorizing Provider  losartan (COZAAR) 100 MG tablet Take 50 mg by mouth daily.    Yes [provider]  fluticasone (FLONASE) 50 MCG/ACT nasal spray as needed. 02/26/15   [provider]  meloxicam (MOBIC) 15 MG tablet Take 1 tablet by mouth daily. 02/22/15   [provider]  omeprazole (PRILOSEC) 20 MG capsule Take 20 mg by mouth daily.    [provider]    Family History Family History  Problem Relation Age of Onset  . Diverticulitis Mother   . Colon cancer Father   . Cancer Maternal Grandmother   . Esophageal cancer Neg Hx   . Stomach cancer Neg Hx   . Rectal cancer Neg Hx     Social History Social History   Tobacco Use  . Smoking status: Former Smoker    Years: 10.00    Types: Cigarettes    Quit date: 02/26/1985    Years since quitting: 34.5  . Smokeless tobacco: Never Used  Substance Use Topics  . Alcohol use: Yes    Alcohol/week: 2.0 standard drinks    Types: 2 Cans of beer per week  . Drug use: No     Allergies   Demerol [  meperidine]   Review of Systems Review of Systems  All other systems reviewed and are negative.    Physical Exam Updated Vital Signs BP (!) 166/95 (BP Location: Right Arm)   Pulse (!) 51   Temp 97.7 F (36.5 C) (Oral)   Resp 20   Ht 5\' 11"  (1.803 m)   Wt 108.9 kg   SpO2 100%   BMI 33.47 kg/m   Physical Exam Vitals signs and nursing note reviewed.  Constitutional:      General: He is not in acute distress.    Appearance: He is well-developed. He is not diaphoretic.  HENT:     Head: Normocephalic and atraumatic.  Neck:     Musculoskeletal: Normal range of motion and neck supple.  Cardiovascular:     Rate and Rhythm: Normal rate and regular rhythm.     Heart sounds: No murmur. No friction rub.  Pulmonary:     Effort: Pulmonary effort is normal. No respiratory distress.     Breath sounds: Normal breath sounds. No wheezing  or rales.  Abdominal:     General: Bowel sounds are normal. There is no distension.     Palpations: Abdomen is soft.     Tenderness: There is no abdominal tenderness.  Musculoskeletal: Normal range of motion.  Skin:    General: Skin is warm and dry.     Comments: There is redness and swelling to the right lateral lower leg just distal to the knee.  There is induration of the skin with slight fluctuance.  The area measures approximately 5 cm x 5 cm.  Neurological:     Mental Status: He is alert and oriented to person, place, and time.     Coordination: Coordination normal.      ED Treatments / Results  Labs (all labs ordered are listed, but only abnormal results are displayed) Labs Reviewed - No data to display  EKG None  Radiology No results found.  Procedures Procedures (including critical care time)  Medications Ordered in ED Medications  lidocaine (PF) (XYLOCAINE) 1 % injection 5 mL (has no administration in time range)  clindamycin (CLEOCIN) IVPB 600 mg (600 mg Intravenous New Bag/Given 08/27/19 0124)     Initial Impression / Assessment and Plan / ED Course  I have reviewed the triage vital signs and the nursing notes.  Pertinent labs & imaging results that were available during my care of the patient were reviewed by me and considered in my medical decision making (see chart for details).  Patient presenting here with worsening cellulitis despite oral clindamycin x6 doses.  He was given an IV dose of clindamycin and will discharge to home.  Will have close follow up with pcp/return if worsens.   INCISION AND DRAINAGE Performed by: Veryl Speak Consent: Verbal consent obtained. Risks and benefits: risks, benefits and alternatives were discussed Type: abscess  Body area: Right lower leg  Anesthesia: local infiltration  Incision was made with a scalpel.  Local anesthetic: lidocaine 1 % without epinephrine  Anesthetic total: 2 ml  Complexity: complex  Blunt dissection to break up loculations  Drainage: purulent  Drainage amount: Slight  Packing material: No packing placed  Patient tolerance: Patient tolerated the procedure well with no immediate complications.     Final Clinical Impressions(s) / ED Diagnoses   Final diagnoses:  None    ED Discharge Orders    None       Veryl Speak, MD 08/27/19 [phone]

## 2019-08-27 NOTE — ED Triage Notes (Signed)
Pt was seen at [facility] and dx with cellulitis yesterday evening. Pt was started on clindamycin. Area of erythema continues to spread outside of marked area.

## 2020-05-02 ENCOUNTER — Encounter: Payer: Self-pay | Admitting: Internal Medicine

## 2020-06-07 ENCOUNTER — Other Ambulatory Visit: Payer: Self-pay

## 2020-06-07 ENCOUNTER — Encounter: Payer: Self-pay | Admitting: Internal Medicine

## 2020-06-07 ENCOUNTER — Ambulatory Visit (AMBULATORY_SURGERY_CENTER): Payer: Self-pay | Admitting: *Deleted

## 2020-06-07 VITALS — Ht 71.0 in | Wt 246.0 lb

## 2020-06-07 DIAGNOSIS — Z86010 Personal history of colonic polyps: Secondary | ICD-10-CM

## 2020-06-07 DIAGNOSIS — Z800 Family history of malignant neoplasm of digestive organs: Secondary | ICD-10-CM

## 2020-06-07 NOTE — Progress Notes (Signed)
No egg or soy allergy known to patient  issues with past sedation with any surgeries  or procedures PONV , no intubation problems  No diet pills per patient No home 02 use per patient  No blood thinners per patient  Pt denies issues with constipation  No A fib or A flutter  EMMI video sent to pt's e mail  COVID 19 guidelines implemented in [place] today   03-20-2020 covid vacc x 2   Due to the COVID-19 pandemic we are asking patients to follow these guidelines. Please only bring one care partner. Please be aware that your care partner may wait in the car in the parking lot or if they feel like they will be too hot to wait in the car, they may wait in the lobby on the 4th floor. All care partners are required to wear a mask the entire time (we do not have any that we can provide them), they need to practice social distancing, and we will do a Covid check for all patient's and care partners when you arrive. Also we will check their temperature and your temperature. If the care partner waits in their car they need to stay in the parking lot the entire time and we will call them on their cell phone when the patient is ready for discharge so they can bring the car to the front of the building. Also all patient's will need to wear a mask into building.

## 2020-06-21 ENCOUNTER — Other Ambulatory Visit: Payer: Self-pay

## 2020-06-21 ENCOUNTER — Ambulatory Visit (AMBULATORY_SURGERY_CENTER): Payer: Commercial Managed Care - PPO | Admitting: Internal Medicine

## 2020-06-21 ENCOUNTER — Encounter: Payer: Self-pay | Admitting: Internal Medicine

## 2020-06-21 VITALS — BP 126/72 | HR 53 | Temp 97.7°F | Resp 16 | Ht 71.0 in | Wt 246.0 lb

## 2020-06-21 DIAGNOSIS — Z86010 Personal history of colonic polyps: Secondary | ICD-10-CM

## 2020-06-21 DIAGNOSIS — D124 Benign neoplasm of descending colon: Secondary | ICD-10-CM

## 2020-06-21 DIAGNOSIS — Z800 Family history of malignant neoplasm of digestive organs: Secondary | ICD-10-CM

## 2020-06-21 DIAGNOSIS — D120 Benign neoplasm of cecum: Secondary | ICD-10-CM

## 2020-06-21 MED ORDER — SODIUM CHLORIDE 0.9 % IV SOLN: 500.0000 mL | Freq: Once | INTRAVENOUS | Status: DC

## 2020-06-21 NOTE — Op Note (Signed)
[facility] Patient Name: Hayden Mcbride Procedure Date: 06/21/2020 9:15 AM MRN: [id number] Endoscopist: Gatha Mayer , MD Age: 59 Referring MD:  Date of Birth: 02/08/1961 Gender: Male Account #: [id number] Procedure:                Colonoscopy Indications:              Surveillance: Personal history of adenomatous                            polyps on last colonoscopy 5 years ago Medicines:                Propofol per Anesthesia, Monitored Anesthesia Care Procedure:                Pre-Anesthesia Assessment:                           - Prior to the procedure, a History and Physical                            was performed, and patient medications and                            allergies were reviewed. The patient's tolerance of                            previous anesthesia was also reviewed. The risks                            and benefits of the procedure and the sedation                            options and risks were discussed with the patient.                            All questions were answered, and informed consent                            was obtained. Prior Anticoagulants: The patient has                            taken no previous anticoagulant or antiplatelet                            agents. ASA Grade Assessment: III - A patient with                            severe systemic disease. After reviewing the risks                            and benefits, the patient was deemed in                            satisfactory condition to undergo the procedure.  After obtaining informed consent, the colonoscope                            was passed under direct vision. Throughout the                            procedure, the patient's blood pressure, pulse, and                            oxygen saturations were monitored continuously. The                            Colonoscope was introduced through the anus and                             advanced to the the cecum, identified by                            appendiceal orifice and ileocecal valve. The                            colonoscopy was performed without difficulty. The                            patient tolerated the procedure well. The quality                            of the bowel preparation was good. The bowel                            preparation used was Miralax via split dose                            instruction. The ileocecal valve, appendiceal                            orifice, and rectum were photographed. Scope In: 9:31:08 AM Scope Out: 9:54:23 AM Scope Withdrawal Time: 0 hours 19 minutes 46 seconds  Total Procedure Duration: 0 hours 23 minutes 15 seconds  Findings:                 The perianal and digital rectal examinations were                            normal. Pertinent negatives include normal prostate                            (size, shape, and consistency).                           Three sessile polyps were found in the descending                            colon, transverse colon and cecum. The polyps were  diminutive in size. These polyps were removed with                            a cold snare. Resection was complete, but the polyp                            tissue was only partially retrieved. Verification                            of patient identification for the specimen was                            done. Estimated blood loss was minimal.                           Multiple diverticula were found in the sigmoid                            colon.                           The exam was otherwise without abnormality on                            direct and retroflexion views. Complications:            No immediate complications. Estimated Blood Loss:     Estimated blood loss was minimal. Impression:               - Three diminutive polyps in the descending colon,                            in the transverse  colon and in the cecum, removed                            with a cold snare. Complete resection. Partial                            retrieval.                           - Diverticulosis in the sigmoid colon.                           - The examination was otherwise normal on direct                            and retroflexion views.                           - Personal history of colonic polyps. Adenoma 2007,                            adenoma and 1 polyp not recovered 2016, als fHx  CRCA father in 72's Recommendation:           - Patient has a contact number available for                            emergencies. The signs and symptoms of potential                            delayed complications were discussed with the                            patient. Return to normal activities tomorrow.                            Written discharge instructions were provided to the                            patient.                           - Resume previous diet.                           - Continue present medications.                           - Repeat colonoscopy is recommended for                            surveillance. The colonoscopy date will be                            determined after pathology results from today's                            exam become available for review. Gatha Mayer, MD 06/21/2020 10:03:31 AM This report has been signed electronically.

## 2020-06-21 NOTE — Patient Instructions (Addendum)
I found and removed 3 tiny polyps. 2 of 3 were picked up for analysis.  I will let you know pathology results and when to have another routine colonoscopy by mail and/or My Chart.  Anticipate 5 years for next one.  I appreciate the opportunity to care for you. Gatha Mayer, MD, FACG  YOU HAD AN ENDOSCOPIC PROCEDURE TODAY AT [facility]:   Refer to the procedure report that was given to you for any specific questions about what was found during the examination.  If the procedure report does not answer your questions, please call your gastroenterologist to clarify.  If you requested that your care partner not be given the details of your procedure findings, then the procedure report has been included in a sealed envelope for you to review at your convenience later.  YOU SHOULD EXPECT: Some feelings of bloating in the abdomen. Passage of more gas than usual.  Walking can help get rid of the air that was put into your GI tract during the procedure and reduce the bloating. If you had a lower endoscopy (such as a colonoscopy or flexible sigmoidoscopy) you may notice spotting of blood in your stool or on the toilet paper. If you underwent a bowel prep for your procedure, you may not have a normal bowel movement for a few days.  Please Note:  You might notice some irritation and congestion in your nose or some drainage.  This is from the oxygen used during your procedure.  There is no need for concern and it should clear up in a day or so.  SYMPTOMS TO REPORT IMMEDIATELY:   Following lower endoscopy (colonoscopy or flexible sigmoidoscopy):  Excessive amounts of blood in the stool  Significant tenderness or worsening of abdominal pains  Swelling of the abdomen that is new, acute  Fever of 100F or higher  For urgent or emergent issues, a gastroenterologist can be reached at any hour by calling [phone]. Do not use MyChart messaging for urgent concerns.    DIET:  We do  recommend a small meal at first, but then you may proceed to your regular diet.  Drink plenty of fluids but you should avoid alcoholic beverages for 24 hours.  ACTIVITY:  You should plan to take it easy for the rest of today and you should NOT DRIVE or use heavy machinery until tomorrow (because of the sedation medicines used during the test).    FOLLOW UP: Our staff will call the number listed on your records 48-72 hours following your procedure to check on you and address any questions or concerns that you may have regarding the information given to you following your procedure. If we do not reach you, we will leave a message.  We will attempt to reach you two times.  During this call, we will ask if you have developed any symptoms of COVID 19. If you develop any symptoms (ie: fever, flu-like symptoms, shortness of breath, cough etc.) before then, please call [phone].  If you test positive for Covid 19 in the 2 weeks post procedure, please call and report this information to [place].    If any biopsies were taken you will be contacted by phone or by letter within the next 1-3 weeks.  Please call us at [phone] if you have not heard about the biopsies in 3 weeks.    SIGNATURES/CONFIDENTIALITY: You and/or your care partner have signed paperwork which will be entered into your electronic medical record.  These signatures attest to the fact that that the information above on your After Visit Summary has been reviewed and is understood.  Full responsibility of the confidentiality of this discharge information lies with you and/or your care-partner.

## 2020-06-21 NOTE — Progress Notes (Signed)
pt tolerated well. VSS. awake and to recovery. Report given to RN.  

## 2020-06-21 NOTE — Progress Notes (Signed)
VS-CW  Pt's states no medical or surgical changes since previsit or office visit.  

## 2020-06-21 NOTE — Progress Notes (Signed)
Called to room to assist during endoscopic procedure.  Patient ID and intended procedure confirmed with present staff. Received instructions for my participation in the procedure from the performing physician.  

## 2020-06-25 ENCOUNTER — Telehealth: Payer: Self-pay

## 2020-06-25 ENCOUNTER — Encounter: Payer: Self-pay | Admitting: Internal Medicine

## 2020-06-25 NOTE — Telephone Encounter (Signed)
Left message on follow up call. 

## 2020-06-25 NOTE — Telephone Encounter (Signed)
  Follow up Call-  Call back number 06/21/2020  Post procedure Call Back phone  # [phone]  Permission to leave phone message Yes  Some recent data might be hidden     Patient questions:  Do you have a fever, pain , or abdominal swelling? No. Pain Score  0 *  Have you tolerated food without any problems? Yes.    Have you been able to return to your normal activities? Yes.    Do you have any questions about your discharge instructions: Diet   No. Medications  No. Follow up visit  No.  Do you have questions or concerns about your Care? No.  Actions: * If pain score is 4 or above: No action needed, pain <4.  1. Have you developed a fever since your procedure? no  2.   Have you had an respiratory symptoms (SOB or cough) since your procedure? no  3.   Have you tested positive for COVID 19 since your procedure no  4.   Have you had any family members/close contacts diagnosed with the COVID 19 since your procedure?  no   If yes to any of these questions please route to Joylene John, RN and Erenest Rasher, RN

## 2021-04-29 ENCOUNTER — Emergency Department (HOSPITAL_COMMUNITY): Payer: Commercial Managed Care - PPO

## 2021-04-29 ENCOUNTER — Inpatient Hospital Stay (HOSPITAL_COMMUNITY)
Admission: EM | Admit: 2021-04-29 | Discharge: 2021-05-19 | DRG: 870 | Disposition: A | Discharge status: Rehabilitation Facility | Payer: Commercial Managed Care - PPO | Attending: Internal Medicine | Admitting: Internal Medicine

## 2021-04-29 DIAGNOSIS — H9191 Unspecified hearing loss, right ear: Secondary | ICD-10-CM | POA: Diagnosis present

## 2021-04-29 DIAGNOSIS — E669 Obesity, unspecified: Secondary | ICD-10-CM | POA: Diagnosis present

## 2021-04-29 DIAGNOSIS — Z6835 Body mass index (BMI) 35.0-35.9, adult: Secondary | ICD-10-CM

## 2021-04-29 DIAGNOSIS — I10 Essential (primary) hypertension: Secondary | ICD-10-CM | POA: Diagnosis present

## 2021-04-29 DIAGNOSIS — E8770 Fluid overload, unspecified: Secondary | ICD-10-CM | POA: Diagnosis not present

## 2021-04-29 DIAGNOSIS — Z452 Encounter for adjustment and management of vascular access device: Secondary | ICD-10-CM

## 2021-04-29 DIAGNOSIS — G039 Meningitis, unspecified: Secondary | ICD-10-CM

## 2021-04-29 DIAGNOSIS — T17908A Unspecified foreign body in respiratory tract, part unspecified causing other injury, initial encounter: Secondary | ICD-10-CM

## 2021-04-29 DIAGNOSIS — G001 Pneumococcal meningitis: Secondary | ICD-10-CM

## 2021-04-29 DIAGNOSIS — J15212 Pneumonia due to Methicillin resistant Staphylococcus aureus: Secondary | ICD-10-CM | POA: Diagnosis not present

## 2021-04-29 DIAGNOSIS — J9600 Acute respiratory failure, unspecified whether with hypoxia or hypercapnia: Secondary | ICD-10-CM

## 2021-04-29 DIAGNOSIS — Y848 Other medical procedures as the cause of abnormal reaction of the patient, or of later complication, without mention of misadventure at the time of the procedure: Secondary | ICD-10-CM | POA: Diagnosis not present

## 2021-04-29 DIAGNOSIS — W19XXXA Unspecified fall, initial encounter: Secondary | ICD-10-CM | POA: Diagnosis present

## 2021-04-29 DIAGNOSIS — E279 Disorder of adrenal gland, unspecified: Secondary | ICD-10-CM | POA: Diagnosis present

## 2021-04-29 DIAGNOSIS — D751 Secondary polycythemia: Secondary | ICD-10-CM | POA: Diagnosis not present

## 2021-04-29 DIAGNOSIS — Z95828 Presence of other vascular implants and grafts: Secondary | ICD-10-CM

## 2021-04-29 DIAGNOSIS — Z9049 Acquired absence of other specified parts of digestive tract: Secondary | ICD-10-CM

## 2021-04-29 DIAGNOSIS — I878 Other specified disorders of veins: Secondary | ICD-10-CM | POA: Diagnosis present

## 2021-04-29 DIAGNOSIS — I480 Paroxysmal atrial fibrillation: Secondary | ICD-10-CM | POA: Diagnosis not present

## 2021-04-29 DIAGNOSIS — E872 Acidosis: Secondary | ICD-10-CM | POA: Diagnosis not present

## 2021-04-29 DIAGNOSIS — H409 Unspecified glaucoma: Secondary | ICD-10-CM | POA: Diagnosis present

## 2021-04-29 DIAGNOSIS — R471 Dysarthria and anarthria: Secondary | ICD-10-CM | POA: Diagnosis present

## 2021-04-29 DIAGNOSIS — I4892 Unspecified atrial flutter: Secondary | ICD-10-CM | POA: Diagnosis not present

## 2021-04-29 DIAGNOSIS — R0902 Hypoxemia: Secondary | ICD-10-CM

## 2021-04-29 DIAGNOSIS — Z79899 Other long term (current) drug therapy: Secondary | ICD-10-CM

## 2021-04-29 DIAGNOSIS — D6959 Other secondary thrombocytopenia: Secondary | ICD-10-CM | POA: Diagnosis not present

## 2021-04-29 DIAGNOSIS — R6521 Severe sepsis with septic shock: Secondary | ICD-10-CM | POA: Diagnosis not present

## 2021-04-29 DIAGNOSIS — J9811 Atelectasis: Secondary | ICD-10-CM

## 2021-04-29 DIAGNOSIS — K219 Gastro-esophageal reflux disease without esophagitis: Secondary | ICD-10-CM | POA: Diagnosis present

## 2021-04-29 DIAGNOSIS — G929 Unspecified toxic encephalopathy: Secondary | ICD-10-CM | POA: Diagnosis not present

## 2021-04-29 DIAGNOSIS — G0490 Encephalitis and encephalomyelitis, unspecified: Secondary | ICD-10-CM | POA: Diagnosis present

## 2021-04-29 DIAGNOSIS — J9601 Acute respiratory failure with hypoxia: Secondary | ICD-10-CM

## 2021-04-29 DIAGNOSIS — R7989 Other specified abnormal findings of blood chemistry: Secondary | ICD-10-CM | POA: Diagnosis not present

## 2021-04-29 DIAGNOSIS — R509 Fever, unspecified: Secondary | ICD-10-CM

## 2021-04-29 DIAGNOSIS — B370 Candidal stomatitis: Secondary | ICD-10-CM | POA: Diagnosis present

## 2021-04-29 DIAGNOSIS — N179 Acute kidney failure, unspecified: Secondary | ICD-10-CM | POA: Diagnosis not present

## 2021-04-29 DIAGNOSIS — M7989 Other specified soft tissue disorders: Secondary | ICD-10-CM | POA: Diagnosis not present

## 2021-04-29 DIAGNOSIS — R4182 Altered mental status, unspecified: Secondary | ICD-10-CM | POA: Diagnosis present

## 2021-04-29 DIAGNOSIS — E876 Hypokalemia: Secondary | ICD-10-CM | POA: Diagnosis not present

## 2021-04-29 DIAGNOSIS — Z885 Allergy status to narcotic agent status: Secondary | ICD-10-CM

## 2021-04-29 DIAGNOSIS — Z20822 Contact with and (suspected) exposure to covid-19: Secondary | ICD-10-CM | POA: Diagnosis present

## 2021-04-29 DIAGNOSIS — R739 Hyperglycemia, unspecified: Secondary | ICD-10-CM | POA: Diagnosis not present

## 2021-04-29 DIAGNOSIS — J15211 Pneumonia due to Methicillin susceptible Staphylococcus aureus: Secondary | ICD-10-CM

## 2021-04-29 DIAGNOSIS — I493 Ventricular premature depolarization: Secondary | ICD-10-CM | POA: Diagnosis not present

## 2021-04-29 DIAGNOSIS — R339 Retention of urine, unspecified: Secondary | ICD-10-CM | POA: Diagnosis not present

## 2021-04-29 DIAGNOSIS — Z86010 Personal history of colonic polyps: Secondary | ICD-10-CM

## 2021-04-29 DIAGNOSIS — E870 Hyperosmolality and hypernatremia: Secondary | ICD-10-CM | POA: Diagnosis not present

## 2021-04-29 DIAGNOSIS — Z4659 Encounter for fitting and adjustment of other gastrointestinal appliance and device: Secondary | ICD-10-CM

## 2021-04-29 DIAGNOSIS — A403 Sepsis due to Streptococcus pneumoniae: Principal | ICD-10-CM | POA: Diagnosis present

## 2021-04-29 DIAGNOSIS — G4733 Obstructive sleep apnea (adult) (pediatric): Secondary | ICD-10-CM | POA: Diagnosis present

## 2021-04-29 DIAGNOSIS — J95851 Ventilator associated pneumonia: Secondary | ICD-10-CM | POA: Diagnosis not present

## 2021-04-29 DIAGNOSIS — Z87891 Personal history of nicotine dependence: Secondary | ICD-10-CM

## 2021-04-29 DIAGNOSIS — Z781 Physical restraint status: Secondary | ICD-10-CM

## 2021-04-29 LAB — COMPREHENSIVE METABOLIC PANEL
ALT: 27 U/L (ref 0–44)
AST: 32 U/L (ref 15–41)
Albumin: 4 g/dL (ref 3.5–5.0)
Alkaline Phosphatase: 50 U/L (ref 38–126)
Anion gap: 13 (ref 5–15)
BUN: 13 mg/dL (ref 8–23)
CO2: 21 mmol/L — ABNORMAL LOW (ref 22–32)
Calcium: 9 mg/dL (ref 8.9–10.3)
Chloride: 105 mmol/L (ref 98–111)
Creatinine, Ser: 1.05 mg/dL (ref 0.61–1.24)
GFR, Estimated: >60 mL/min (ref >60)
Glucose, Bld: 129 mg/dL — ABNORMAL HIGH (ref 70–99)
Potassium: 3.2 mmol/L — ABNORMAL LOW (ref 3.5–5.1)
Sodium: 139 mmol/L (ref 135–145)
Total Bilirubin: 2.6 mg/dL — ABNORMAL HIGH (ref 0.3–1.2)
Total Protein: 7 g/dL (ref 6.5–8.1)

## 2021-04-29 LAB — DIFFERENTIAL
Abs Immature Granulocytes: 0.12 10*3/uL — ABNORMAL HIGH (ref 0.00–0.07)
Basophils Absolute: 0 10*3/uL (ref 0.0–0.1)
Basophils Relative: 0 %
Eosinophils Absolute: 0.1 10*3/uL (ref 0.0–0.5)
Eosinophils Relative: 1 %
Immature Granulocytes: 1 %
Lymphocytes Relative: 4 %
Lymphs Abs: 0.7 10*3/uL (ref 0.7–4.0)
Monocytes Absolute: 0.5 10*3/uL (ref 0.1–1.0)
Monocytes Relative: 3 %
Neutro Abs: 16.9 10*3/uL — ABNORMAL HIGH (ref 1.7–7.7)
Neutrophils Relative %: 91 %

## 2021-04-29 LAB — CBC
HCT: 55.4 % — ABNORMAL HIGH (ref 39.0–52.0)
Hemoglobin: 18.7 g/dL — ABNORMAL HIGH (ref 13.0–17.0)
MCH: 31.9 pg (ref 26.0–34.0)
MCHC: 33.8 g/dL (ref 30.0–36.0)
MCV: 94.4 fL (ref 80.0–100.0)
Platelets: 142 10*3/uL — ABNORMAL LOW (ref 150–400)
RBC: 5.87 MIL/uL — ABNORMAL HIGH (ref 4.22–5.81)
RDW: 13 % (ref 11.5–15.5)
WBC: 18.3 10*3/uL — ABNORMAL HIGH (ref 4.0–10.5)
nRBC: 0 % (ref 0.0–0.2)

## 2021-04-29 LAB — I-STAT CHEM 8, ED
BUN: 17 mg/dL (ref 8–23)
Calcium, Ion: 1.08 mmol/L — ABNORMAL LOW (ref 1.15–1.40)
Chloride: 104 mmol/L (ref 98–111)
Creatinine, Ser: 0.8 mg/dL (ref 0.61–1.24)
Glucose, Bld: 125 mg/dL — ABNORMAL HIGH (ref 70–99)
HCT: 55 % — ABNORMAL HIGH (ref 39.0–52.0)
Hemoglobin: 18.7 g/dL — ABNORMAL HIGH (ref 13.0–17.0)
Potassium: 3.2 mmol/L — ABNORMAL LOW (ref 3.5–5.1)
Sodium: 142 mmol/L (ref 135–145)
TCO2: 23 mmol/L (ref 22–32)

## 2021-04-29 LAB — CBG MONITORING, ED: Glucose-Capillary: 152 mg/dL — ABNORMAL HIGH (ref 70–99)

## 2021-04-29 IMAGING — CT CT HEAD CODE STROKE
4 of 5 series · 16 of 47 positions shown, 18 images · non-contrast
Comparison: None.

CLINICAL DATA: Code stroke.  Stroke, follow up

EXAM:
CT HEAD WITHOUT CONTRAST
TECHNIQUE: Contiguous axial images were obtained from the base of the skull
through the vertex without intravenous contrast.

[Series 3: head wo · axial · 0.42mm/px · z∈[+231,+276]mm · 2 of 37 slices shown]
[im 10/37  brain]
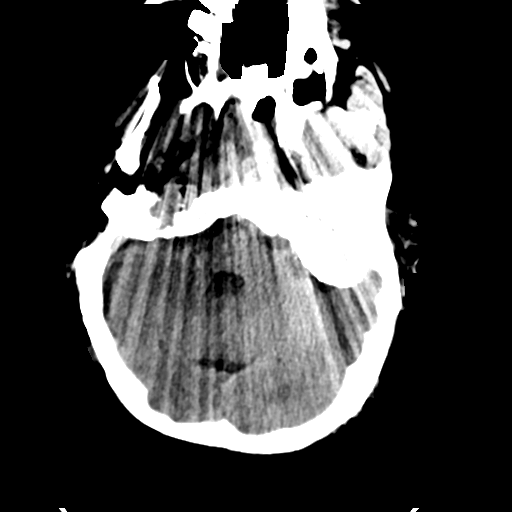
[im 19/37  brain]
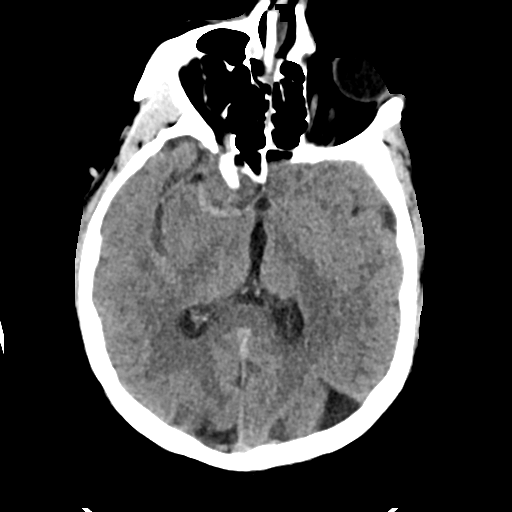

[Series 5: cor soft · coronal · 0.37mm/px · 3 of 75 slices shown]
[im 28/75  brain]
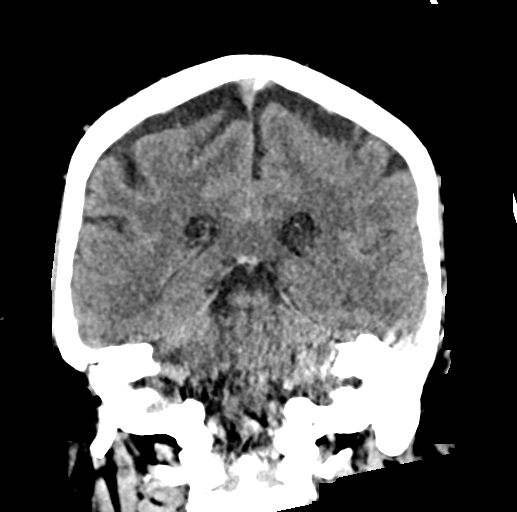
[im 34/75  brain]
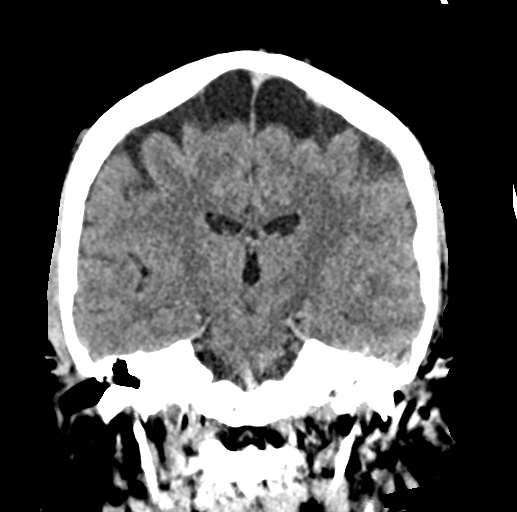
[im 41/75  brain]
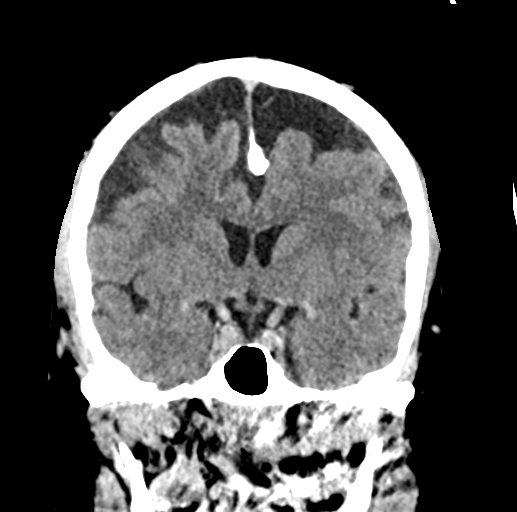

[Series 6: sag soft · sagittal · 0.34mm/px · 3 of 64 slices shown]
[im 26/64  brain]
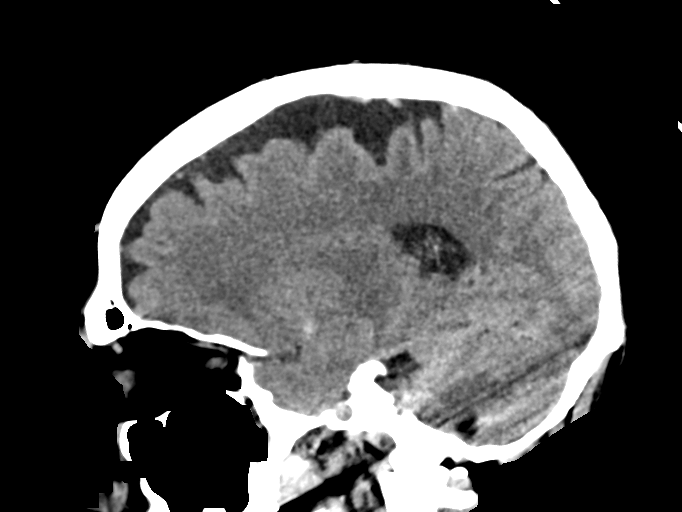
[im 32/64  brain]
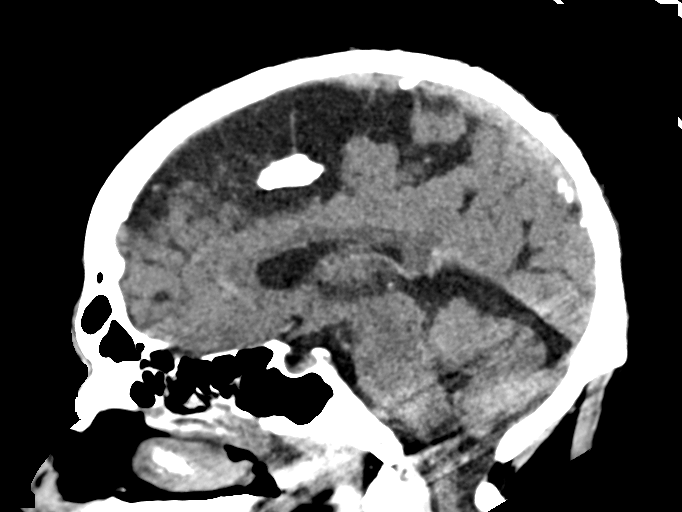
[im 38/64  brain]
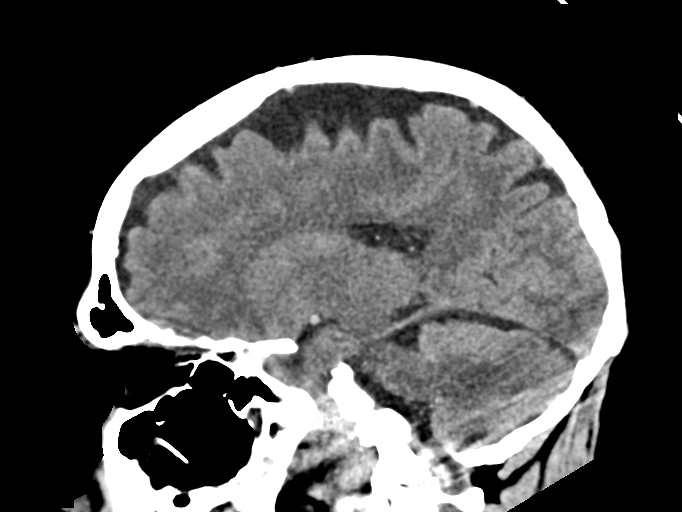

[Series 7: true axial · axial · 0.42mm/px · z∈[+204,+345]mm · 8 of 61 slices shown, 10 images]
[im 7/61  brain]
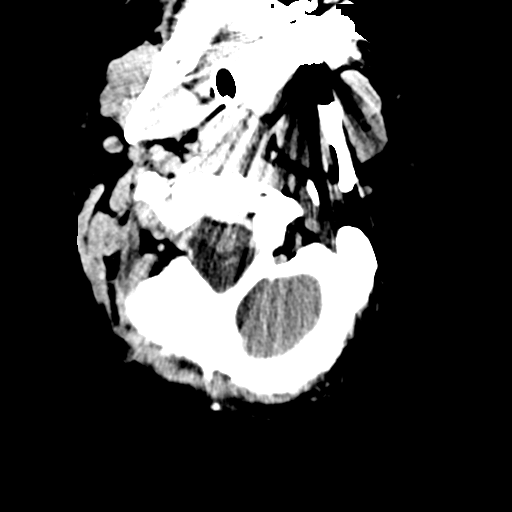
[im 7/61  bone]
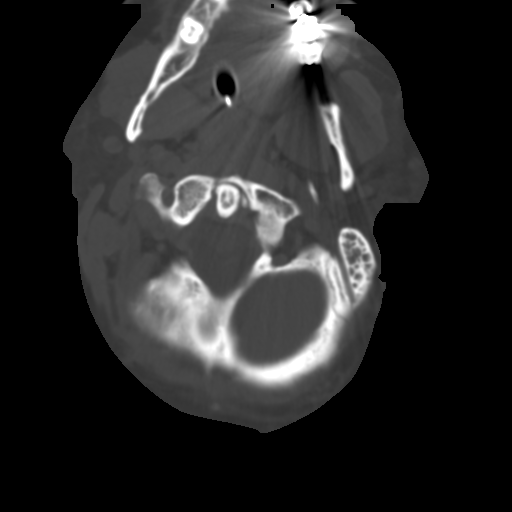
[im 14/61  brain]
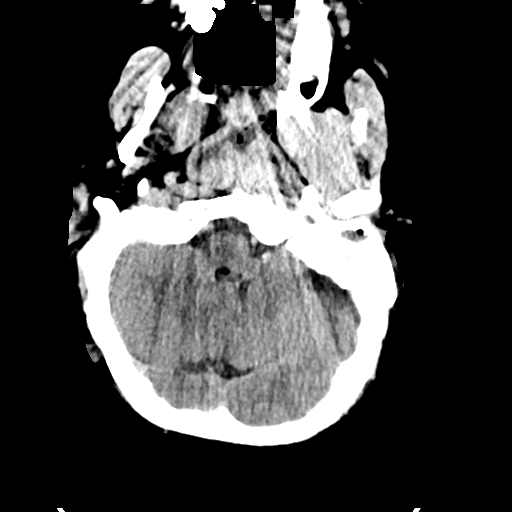
[im 21/61  brain]
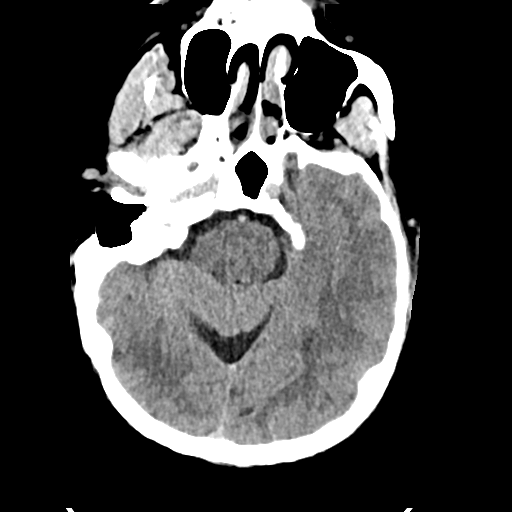
[im 27/61  brain]
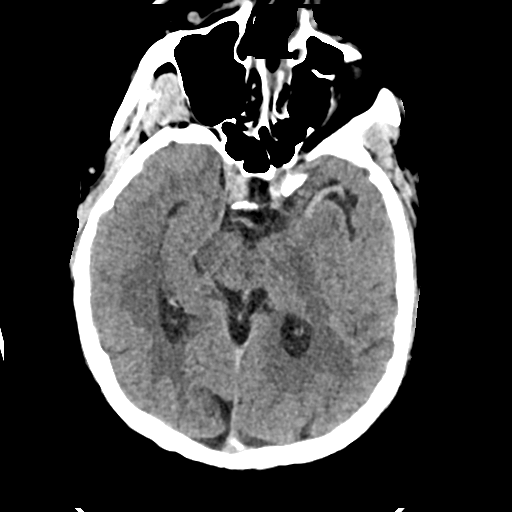
[im 34/61  brain]
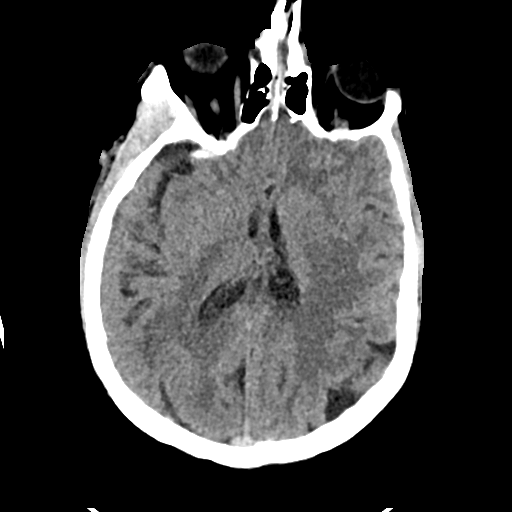
[im 34/61  bone]
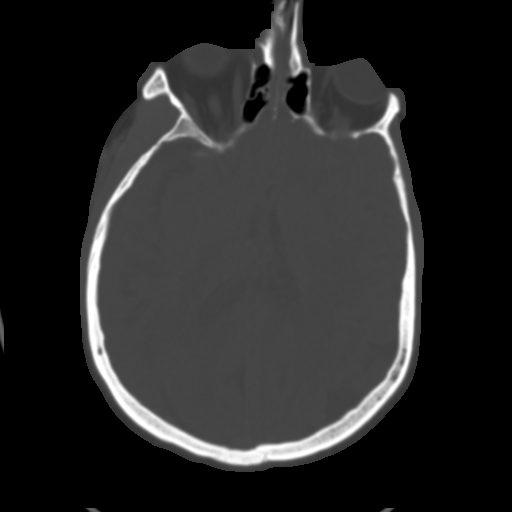
[im 41/61  brain]
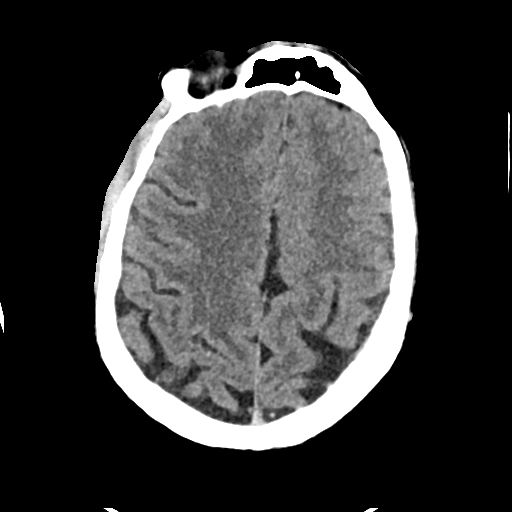
[im 47/61  brain]
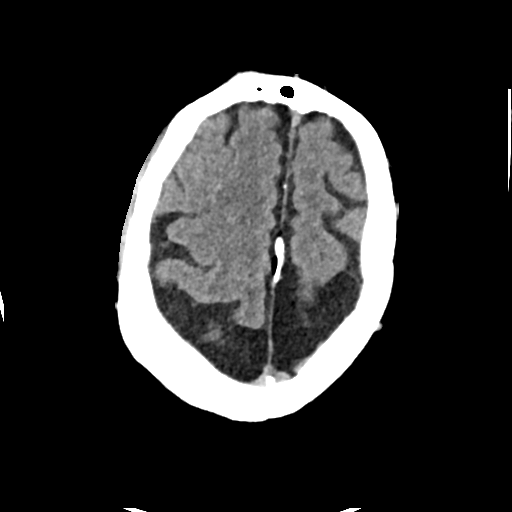
[im 54/61  brain]
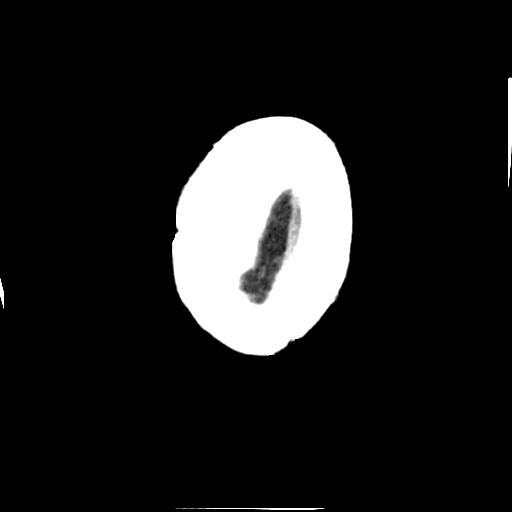

[16 of 47 positions shown; findings below may reference images not displayed]

FINDINGS: Brain: There is no mass, hemorrhage or extra-axial collection. The
size and configuration of the ventricles and extra-axial CSF spaces
are normal. The brain parenchyma is normal, without evidence of
acute or chronic infarction.

Vascular: No abnormal hyperdensity of the major intracranial
arteries or dural venous sinuses. No intracranial atherosclerosis.

Skull: The visualized skull base, calvarium and extracranial soft
tissues are normal.

Sinuses/Orbits: No fluid levels or advanced mucosal thickening of
the visualized paranasal sinuses. Left mastoid effusion. The orbits
are normal.

ASPECTS ([place] Stroke Program Early CT Score)

- Ganglionic level infarction (caudate, lentiform nuclei, internal
capsule, insula, M1-M3 cortex): 7

- Supraganglionic infarction (M4-M6 cortex): 3

Total score (0-10 with 10 being normal): 10
IMPRESSION: 1. No acute intracranial abnormality.
2. ASPECTS is 10.

These results were communicated to Dr. HARN at [DATE] on
[DATE] by text page via the [employer] messaging system.

## 2021-04-29 IMAGING — CT CT ANGIO HEAD-NECK (W OR W/O PERF)
2 of 8 series · 9 of 33 positions shown · IV contrast (APPLIED)
Comparison: None.

CLINICAL DATA: Acute neurologic deficit

EXAM:
CT ANGIOGRAPHY HEAD AND NECK
TECHNIQUE: Multidetector CT imaging of the head and neck was performed using
the standard protocol during bolus administration of intravenous
contrast. Multiplanar CT image reconstructions and MIPs were
obtained to evaluate the vascular anatomy. Carotid stenosis
measurements (when applicable) are obtained utilizing NASCET
criteria, using the distal internal carotid diameter as the
denominator.
CONTRAST:  100mL OMNIPAQUE IOHEXOL 350 MG/ML SOLN

[Series 8: vpct (id) 5.0 hr36 · axial · 0.63mm/px · z∈[+225,+300]mm · 6 of 946 slices shown]
[im 136/946  soft-tissue]
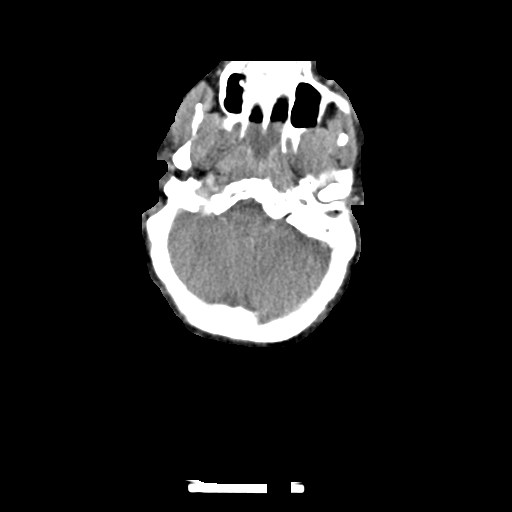
[im 271/946  soft-tissue]
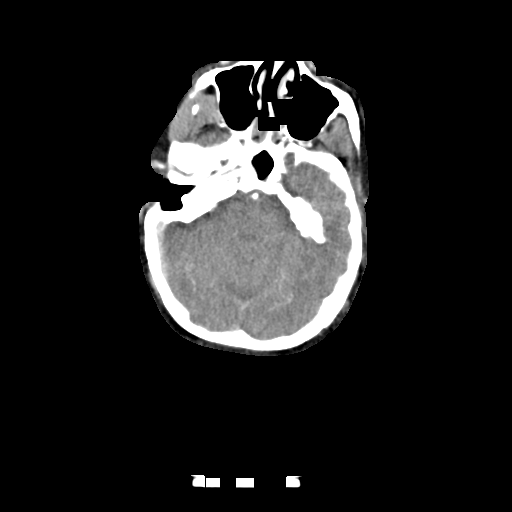
[im 406/946  soft-tissue]
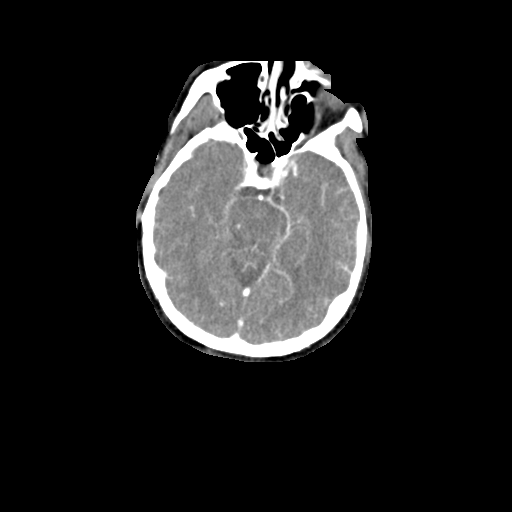
[im 541/946  soft-tissue]
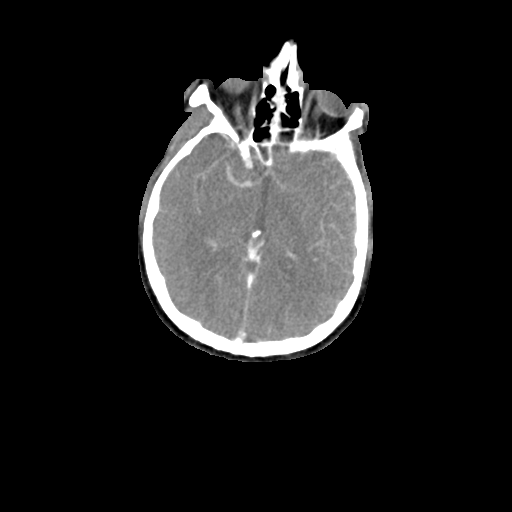
[im 676/946  soft-tissue]
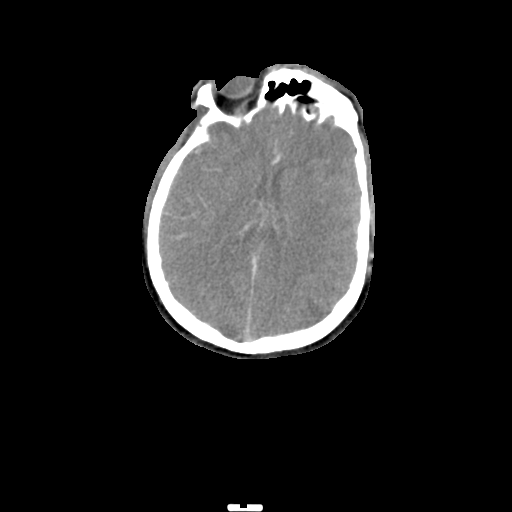
[im 811/946  soft-tissue]
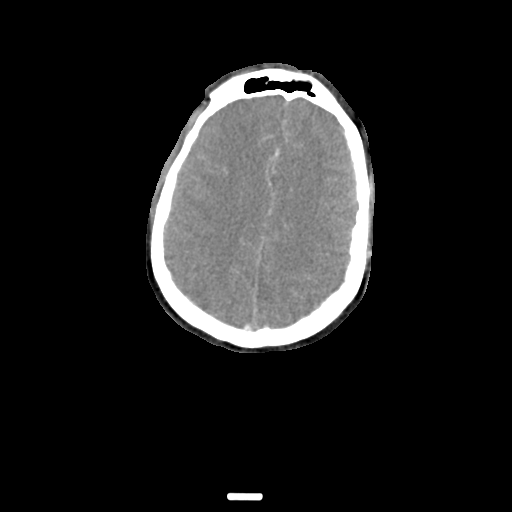

[Series 9: ax thins · axial · 0.46mm/px · z∈[+2,+365]mm · 3 of 365 slices shown]
[im 1/365  soft-tissue]
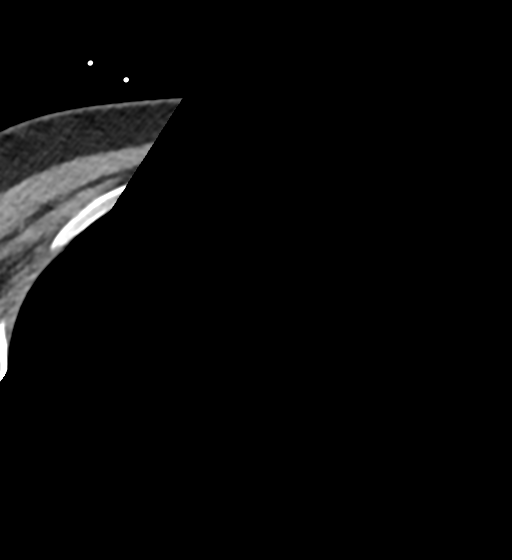
[im 183/365  bone]
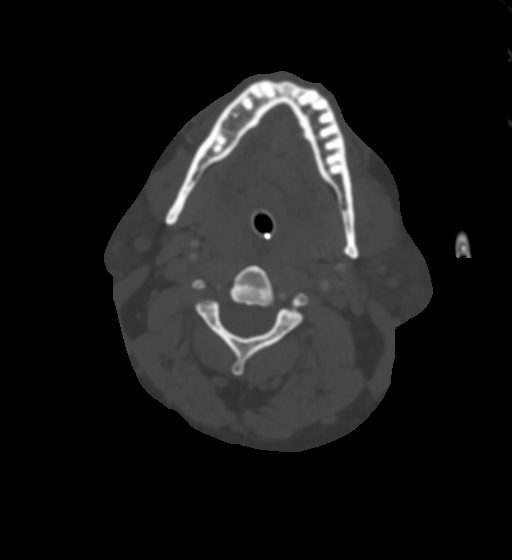
[im 365/365  soft-tissue]
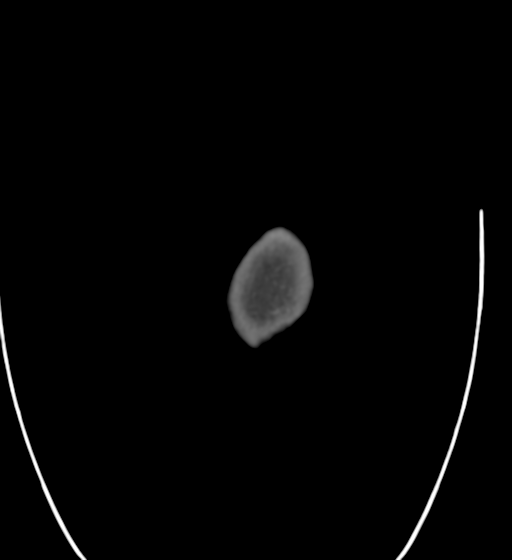

[9 of 33 positions shown; findings below may reference images not displayed]

FINDINGS: CTA NECK FINDINGS

SKELETON: There is no bony spinal canal stenosis. No lytic or
blastic lesion.

OTHER NECK: Normal pharynx, larynx and major salivary glands. No
cervical lymphadenopathy. 4.7 cm right thyroid nodule.

UPPER CHEST: No pneumothorax or pleural effusion. No nodules or
masses.

AORTIC ARCH:

There is no calcific atherosclerosis of the aortic arch. There is no
aneurysm, dissection or hemodynamically significant stenosis of the
visualized portion of the aorta. Conventional 3 vessel aortic
branching pattern. The visualized proximal subclavian arteries are
widely patent.

RIGHT CAROTID SYSTEM: Normal without aneurysm, dissection or
stenosis.

LEFT CAROTID SYSTEM: Normal without aneurysm, dissection or
stenosis.

VERTEBRAL ARTERIES: Left dominant configuration. Both origins are
clearly patent. There is no dissection, occlusion or flow-limiting
stenosis to the skull base (V1-V3 segments).

CTA HEAD FINDINGS

POSTERIOR CIRCULATION:

--Vertebral arteries: Normal V4 segments.

--Inferior cerebellar arteries: Normal.

--Basilar artery: Normal.

--Superior cerebellar arteries: Normal.

--Posterior cerebral arteries (PCA): Normal.

ANTERIOR CIRCULATION:

--Intracranial internal carotid arteries: Normal.

--Anterior cerebral arteries (ACA): Normal. Both A1 segments are
present. Patent anterior communicating artery (a-comm).

--Middle cerebral arteries (MCA): Normal.

VENOUS SINUSES: As permitted by contrast timing, patent.

ANATOMIC VARIANTS: Fetal origin of the left posterior cerebral
artery.

Review of the MIP images confirms the above findings.
IMPRESSION: 1. No emergent large vessel occlusion or high-grade stenosis of the
intracranial arteries.
2. 4.7 cm right thyroid nodule. Recommend thyroid US (ref: [HOSPITAL]. [DATE]): 143-50).

## 2021-04-29 IMAGING — DX DG CHEST 1V PORT
1 series · 1 of 1 positions shown · non-contrast
Comparison: None.

CLINICAL DATA: Status post intubation

EXAM:
PORTABLE CHEST 1 VIEW

[chest ap]
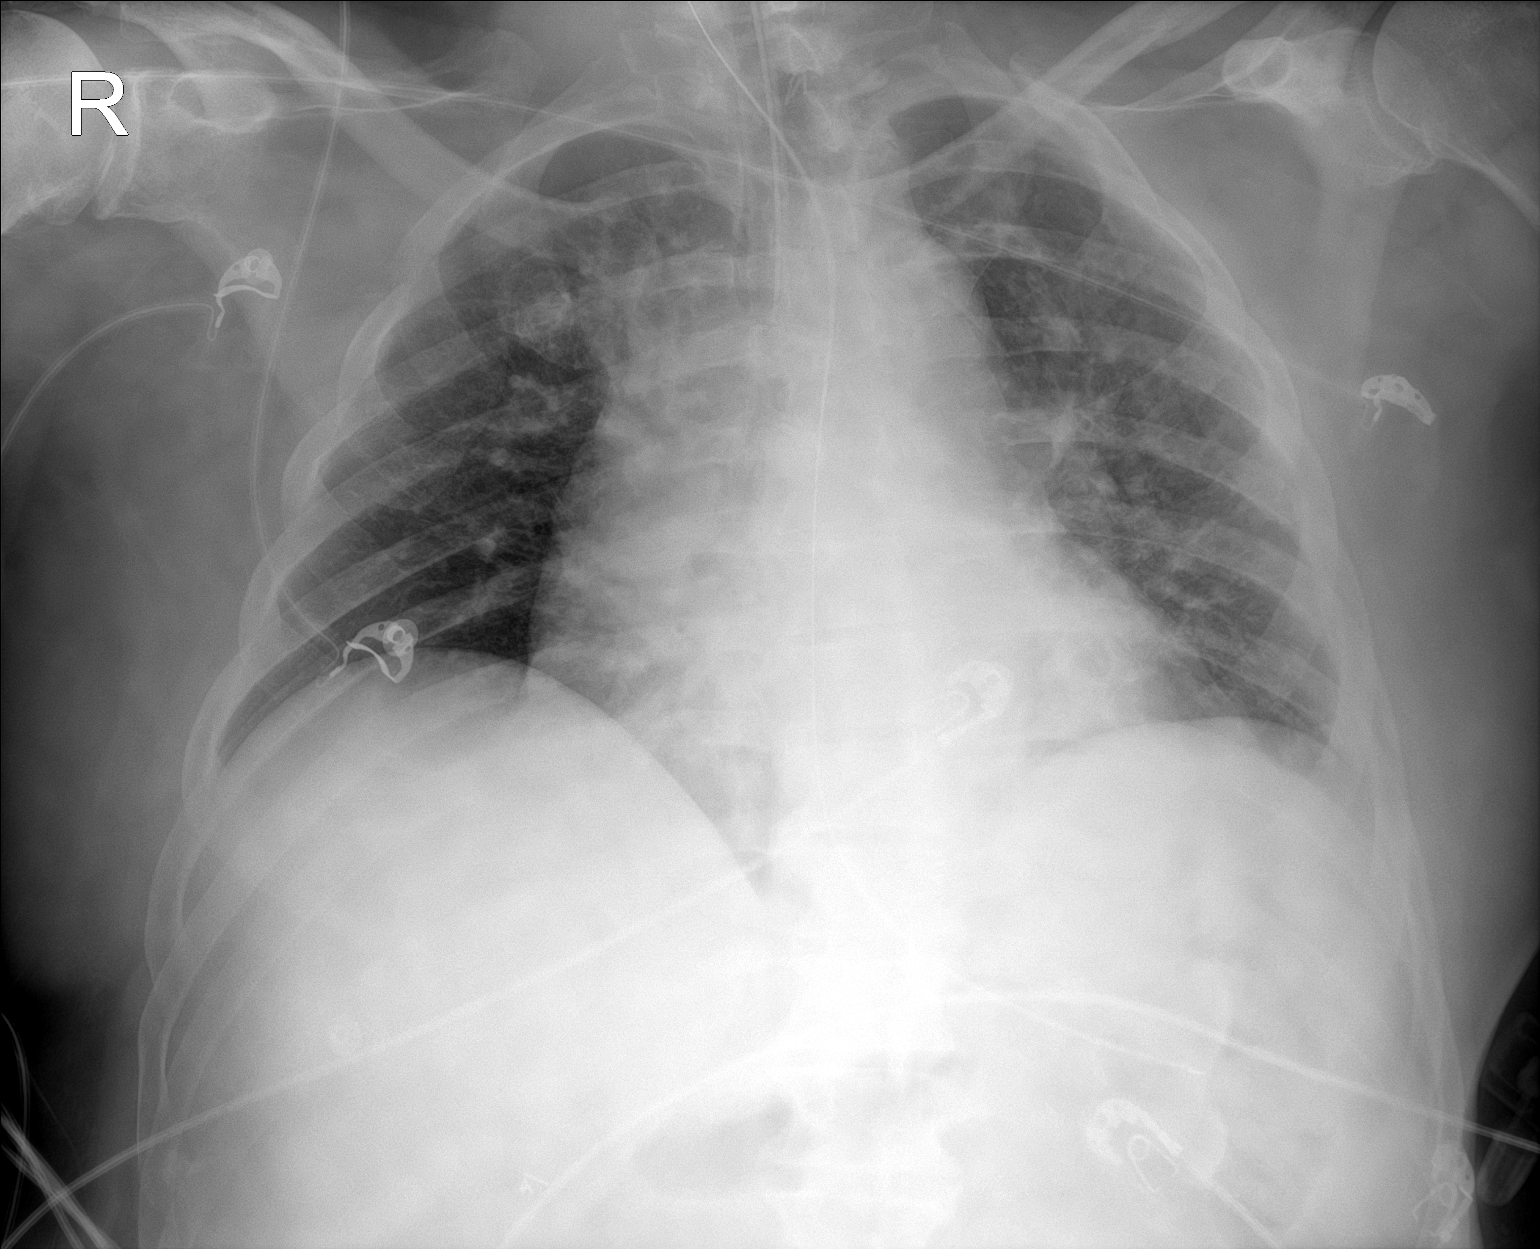

[1 of 1 positions shown; findings below may reference images not displayed]

FINDINGS: Endotracheal tube is noted approximately 3.5 cm above the carina.
Gastric catheter extends into the stomach although the proximal side
port lies at the gastroesophageal junction. This could be advanced
further into the stomach. Cardiac shadow is enlarged accentuated by
the portable technique. Mild vascular congestion is noted.
IMPRESSION: Tubes and lines as described above. Gastric catheter should be
advanced deeper into the stomach.

Mild vascular congestion.

## 2021-04-29 MED ORDER — SODIUM CHLORIDE 0.9% FLUSH
3.0000 mL | Freq: Once | INTRAVENOUS | Status: AC
Start: 2021-04-29 — End: 2021-04-30
  Administered 2021-04-30: 3 mL via INTRAVENOUS

## 2021-04-29 MED ORDER — SODIUM CHLORIDE 0.9 % IV SOLN
2.0000 g | Freq: Two times a day (BID) | INTRAVENOUS | Status: DC
  Administered 2021-04-30 – 2021-05-03 (×8): 2 g via INTRAVENOUS
  Filled 2021-04-29: qty 2
  Filled 2021-04-29 (×5): qty 20
  Filled 2021-04-29 (×2): qty 2
  Filled 2021-04-29: qty 20

## 2021-04-29 MED ORDER — MIDAZOLAM HCL 2 MG/2ML IJ SOLN
INTRAMUSCULAR | Status: AC
  Administered 2021-04-29: 4 mg via INTRAVENOUS
  Filled 2021-04-29: qty 4

## 2021-04-29 MED ORDER — ROCURONIUM BROMIDE 50 MG/5ML IV SOLN
INTRAVENOUS | Status: AC | PRN
  Administered 2021-04-29: 100 mg via INTRAVENOUS

## 2021-04-29 MED ORDER — FENTANYL BOLUS VIA INFUSION
50.0000 ug | INTRAVENOUS | Status: DC | PRN
  Administered 2021-04-29 – 2021-04-30 (×4): 50 ug via INTRAVENOUS
  Administered 2021-05-01 (×2): 25 ug via INTRAVENOUS
  Administered 2021-05-02: 50 ug via INTRAVENOUS
  Administered 2021-05-02: 75 ug via INTRAVENOUS
  Administered 2021-05-02 (×3): 50 ug via INTRAVENOUS
  Administered 2021-05-02: 100 ug via INTRAVENOUS
  Administered 2021-05-02: 75 ug via INTRAVENOUS
  Administered 2021-05-02: 50 ug via INTRAVENOUS
  Administered 2021-05-03 (×7): 75 ug via INTRAVENOUS
  Administered 2021-05-03 – 2021-05-04 (×3): 100 ug via INTRAVENOUS
  Administered 2021-05-04: 75 ug via INTRAVENOUS
  Administered 2021-05-04 – 2021-05-05 (×2): 100 ug via INTRAVENOUS
  Administered 2021-05-05 (×2): 50 ug via INTRAVENOUS
  Administered 2021-05-06 (×2): 100 ug via INTRAVENOUS
  Administered 2021-05-06 (×2): 50 ug via INTRAVENOUS
  Administered 2021-05-06: 100 ug via INTRAVENOUS
  Administered 2021-05-06 – 2021-05-07 (×2): 50 ug via INTRAVENOUS
  Administered 2021-05-08: 100 ug via INTRAVENOUS
  Filled 2021-04-29: qty 100

## 2021-04-29 MED ORDER — IOHEXOL 350 MG/ML SOLN
100.0000 mL | Freq: Once | INTRAVENOUS | Status: AC | PRN
  Administered 2021-04-29: 100 mL via INTRAVENOUS

## 2021-04-29 MED ORDER — MIDAZOLAM HCL 2 MG/2ML IJ SOLN: 4.0000 mg | Freq: Once | INTRAMUSCULAR | Status: AC

## 2021-04-29 MED ORDER — FENTANYL 2500MCG IN NS 250ML (10MCG/ML) PREMIX INFUSION
50.0000 ug/h | INTRAVENOUS | Status: DC
  Administered 2021-04-29 – 2021-04-30 (×2): 50 ug/h via INTRAVENOUS
  Administered 2021-05-01: 100 ug/h via INTRAVENOUS
  Administered 2021-05-02: 50 ug/h via INTRAVENOUS
  Administered 2021-05-03: 100 ug/h via INTRAVENOUS
  Administered 2021-05-03: 75 ug/h via INTRAVENOUS
  Administered 2021-05-04 – 2021-05-06 (×3): 100 ug/h via INTRAVENOUS
  Administered 2021-05-07: 125 ug/h via INTRAVENOUS
  Filled 2021-04-29 (×10): qty 250

## 2021-04-29 MED ORDER — MIDAZOLAM HCL 2 MG/2ML IJ SOLN
INTRAMUSCULAR | Status: AC
  Filled 2021-04-29: qty 2

## 2021-04-29 MED ORDER — VANCOMYCIN HCL 2000 MG/400ML IV SOLN
2000.0000 mg | Freq: Once | INTRAVENOUS | Status: AC
  Administered 2021-04-30: 2000 mg via INTRAVENOUS
  Filled 2021-04-29: qty 400

## 2021-04-29 MED ORDER — ETOMIDATE 2 MG/ML IV SOLN
INTRAVENOUS | Status: AC | PRN
  Administered 2021-04-29: 30 mg via INTRAVENOUS

## 2021-04-29 MED ORDER — FENTANYL CITRATE (PF) 100 MCG/2ML IJ SOLN
50.0000 ug | Freq: Once | INTRAMUSCULAR | Status: DC
  Filled 2021-04-29: qty 2

## 2021-04-29 MED ORDER — SODIUM CHLORIDE 0.9 % IV SOLN
2.0000 g | INTRAVENOUS | Status: DC
  Administered 2021-04-30 – 2021-05-01 (×9): 2 g via INTRAVENOUS
  Filled 2021-04-29: qty 2000
  Filled 2021-04-29 (×2): qty 2
  Filled 2021-04-29: qty 2000
  Filled 2021-04-29 (×2): qty 2
  Filled 2021-04-29: qty 2000
  Filled 2021-04-29 (×2): qty 2
  Filled 2021-04-29 (×4): qty 2000
  Filled 2021-04-29: qty 2

## 2021-04-29 MED ORDER — MIDAZOLAM HCL 2 MG/2ML IJ SOLN
INTRAMUSCULAR | Status: AC | PRN
  Administered 2021-04-29: 2 mg via INTRAVENOUS

## 2021-04-29 MED ORDER — VANCOMYCIN HCL 1000 MG/200ML IV SOLN
1000.0000 mg | Freq: Two times a day (BID) | INTRAVENOUS | Status: DC
  Administered 2021-04-30 – 2021-05-01 (×3): 1000 mg via INTRAVENOUS
  Filled 2021-04-29 (×3): qty 200

## 2021-04-29 NOTE — ED Provider Notes (Signed)
[facility] EMERGENCY DEPARTMENT Provider Note   CSN: [id number] Arrival date & time: 04/29/21  2230     History No chief complaint on file.   Hayden Mcbride is a 60 y.o. male.  HPI   60 year old male presents the emergency department as a code stroke.  Patient evaluated at EMS triage with neurology team at bedside.  Reportedly patient was complaining of a headache earlier in the day and then prior to arrival started acting out of bed and aggressive.  There was a reported unwitnessed fall potential head injury.  No other history available at this time.  Patient is very agitated, repeating certain words and fighting staff.  Past Medical History:  Diagnosis Date  . Allergy   . Dry eyes   . GERD (gastroesophageal reflux disease)   . Glaucoma   . Hx of adenomatous colonic polyps 07/08/2006   06/2006 - diminutive adenoma 04/19/2015 - diminutive adenoma and one polyp lost - repeat colonoscopy 2021  . Hypertension   . Post-operative nausea and vomiting   . Sleep apnea    wears CPAP  . Thyroid disease    long ago- half thyroid removed ~20 yrs ago     Patient Active Problem List   Diagnosis Date Noted  . Family history of colon cancer 04/24/2015  . Dyspnea 03/01/2015  . RBBB 03/01/2015  . Preoperative cardiovascular examination 03/01/2015  . Pain in the chest 03/01/2015  . Morbid obesity ([place]) 03/01/2015  . Hx of adenomatous colonic polyps 07/08/2006    Past Surgical History:  Procedure Laterality Date  . BREATH TEK H PYLORI N/A 12/24/2014   Procedure: BREATH TEK H PYLORI;  Surgeon: Alphonsa Overall, MD;  Location: [facility];  Service: General;  Laterality: N/A;  . CHOLECYSTECTOMY    . COLONOSCOPY    . EYE SURGERY     age 56   . 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 POLYPECTOMY    . SHOULDER SURGERY     left shoulder  . THYROID SURGERY         Family History  Problem Relation Age of Onset  . Diverticulitis Mother   . Colon cancer Father         in his 19's dx'd   . Cancer Maternal Grandmother   . Esophageal cancer Neg Hx   . Stomach cancer Neg Hx   . Rectal cancer Neg Hx   . Colon polyps Neg Hx     Social History   Tobacco Use  . Smoking status: Former Smoker    Years: 10.00    Types: Cigarettes    Quit date: 02/26/1985    Years since quitting: 36.1  . Smokeless tobacco: Never Used  Vaping Use  . Vaping Use: Never used  Substance Use Topics  . Alcohol use: Yes    Alcohol/week: 2.0 standard drinks    Types: 2 Cans of beer per week  . Drug use: No    Home Medications Prior to Admission medications   Medication Sig Start Date End Date Taking? Authorizing Provider  ALPRAZolam Duanne Moron) 0.25 MG tablet SMARTSIG:1 Tablet(s) By Mouth Patient not taking: Reported on 06/07/2020 02/09/20   [provider]  ciprofloxacin (CILOXAN) 0.3 % ophthalmic solution Using in ears 03/31/20   [provider]  cycloSPORINE (RESTASIS) 0.05 % ophthalmic emulsion PLACE 1 GTT BID IN OU 04/26/19   [provider]  Dorzolamide HCl-Timolol Mal PF 2-0.5 % SOLN Apply 1 drop to  eye 2 (two) times daily. 03/31/20   [provider]  losartan (COZAAR) 100 MG tablet Take 50 mg by mouth daily.     [provider]  omeprazole (PRILOSEC) 20 MG capsule Take 20 mg by mouth daily.     [provider]  prednisoLONE acetate (PRED FORTE) 1 % ophthalmic suspension Using in ears 01/06/20   [provider]    Allergies    Demerol [meperidine]  Review of Systems   Review of Systems  Unable to perform ROS: Acuity of condition    Physical Exam Updated Vital Signs BP (!) 145/97   Pulse 74   Resp (!) 41   SpO2 96%   Physical Exam Vitals and nursing note reviewed.  Constitutional:      General: He is in acute distress.  HENT:     Head: Normocephalic.     Ears:     Comments: Left ear drainage Eyes:     Pupils: Pupils are equal, round, and reactive to light.  Cardiovascular:     Rate and  Rhythm: Normal rate.  Pulmonary:     Effort: Pulmonary effort is normal. No respiratory distress.  Abdominal:     Palpations: Abdomen is soft.  Skin:    General: Skin is warm.  Neurological:     Mental Status: He is alert. He is disoriented.     Comments: Disoriented, combative     ED Results / Procedures / Treatments   Labs (all labs ordered are listed, but only abnormal results are displayed) Labs Reviewed  CBC - Abnormal; Notable for the following components:      Result Value   WBC 18.3 (*)    RBC 5.87 (*)    Hemoglobin 18.7 (*)    HCT 55.4 (*)    Platelets 142 (*)    All other components within normal limits  DIFFERENTIAL - Abnormal; Notable for the following components:   Neutro Abs 16.9 (*)    Abs Immature Granulocytes 0.12 (*)    All other components within normal limits  COMPREHENSIVE METABOLIC PANEL - Abnormal; Notable for the following components:   Potassium 3.2 (*)    CO2 21 (*)    Glucose, Bld 129 (*)    Total Bilirubin 2.6 (*)    All other components within normal limits  I-STAT CHEM 8, ED - Abnormal; Notable for the following components:   Potassium 3.2 (*)    Glucose, Bld 125 (*)    Calcium, Ion 1.08 (*)    Hemoglobin 18.7 (*)    HCT 55.0 (*)    All other components within normal limits  CBG MONITORING, ED - Abnormal; Notable for the following components:   Glucose-Capillary 152 (*)    All other components within normal limits  SARS CORONAVIRUS 2 (TAT 6-24 HRS)  PROTIME-INR  APTT  URINALYSIS, ROUTINE W REFLEX MICROSCOPIC  RAPID URINE DRUG SCREEN, HOSP PERFORMED    EKG None  Radiology CT HEAD CODE STROKE WO CONTRAST  Result Date: 04/29/2021 CLINICAL DATA:  Code stroke.  Stroke, follow up EXAM: CT HEAD WITHOUT CONTRAST TECHNIQUE: Contiguous axial images were obtained from the base of the skull through the vertex without intravenous contrast. COMPARISON:  None. FINDINGS: Brain: There is no mass, hemorrhage or extra-axial collection. The size and  configuration of the ventricles and extra-axial CSF spaces are normal. The brain parenchyma is normal, without evidence of acute or chronic infarction. Vascular: No abnormal hyperdensity of the major intracranial arteries or dural venous sinuses. No  intracranial atherosclerosis. Skull: The visualized skull base, calvarium and extracranial soft tissues are normal. Sinuses/Orbits: No fluid levels or advanced mucosal thickening of the visualized paranasal sinuses. Left mastoid effusion. The orbits are normal. ASPECTS [facility] Stroke Program Early CT Score) - Ganglionic level infarction (caudate, lentiform nuclei, internal capsule, insula, M1-M3 cortex): 7 - Supraganglionic infarction (M4-M6 cortex): 3 Total score (0-10 with 10 being normal): 10 IMPRESSION: 1. No acute intracranial abnormality. 2. ASPECTS is 10. These results were communicated to Dr. Lesleigh Noe at 11:09 pm on 04/29/2021 by text page via the [facility] messaging system. Electronically Signed   By: Ulyses Jarred M.D.   On: 04/29/2021 23:09    Procedures Procedure Name: Intubation Date/Time: 04/29/2021 11:14 PM Performed by: Lorelle Gibbs, DO Pre-anesthesia Checklist: Patient identified, Patient being monitored, Emergency Drugs available, Timeout performed and Suction available Oxygen Delivery Method: Non-rebreather mask Preoxygenation: Pre-oxygenation with 100% oxygen Induction Type: Rapid sequence Ventilation: Mask ventilation without difficulty Laryngoscope Size: Glidescope and 4 Tube size: 7.5 mm Number of attempts: 1 Placement Confirmation: ETT inserted through vocal cords under direct vision,  CO2 detector and Breath sounds checked- equal and bilateral Tube secured with: ETT holder    .Critical Care Performed by: Lorelle Gibbs, DO Authorized by: Lorelle Gibbs, DO   Critical care provider statement:    Critical care time (minutes):  45   Critical care was necessary to treat or prevent imminent or life-threatening  deterioration of the following conditions:  CNS failure or compromise   Critical care was time spent personally by me on the following activities:  Discussions with consultants, evaluation of patient's response to treatment, examination of patient, ordering and performing treatments and interventions, ordering and review of laboratory studies, ordering and review of radiographic studies, pulse oximetry, re-evaluation of patient's condition, obtaining history from patient or surrogate and review of old charts   I assumed direction of critical care for this patient from another provider in my specialty: no       Medications Ordered in ED Medications  sodium chloride flush (NS) 0.9 % injection 3 mL (has no administration in time range)  fentaNYL (SUBLIMAZE) injection 50 mcg (has no administration in time range)  fentaNYL 2527mcg in NS 289mL (88mcg/ml) infusion-PREMIX (50 mcg/hr Intravenous New Bag/Given 04/29/21 2256)  fentaNYL (SUBLIMAZE) bolus via infusion 50-100 mcg (has no administration in time range)  midazolam (VERSED) injection (  Canceled Entry 04/29/21 2300)  etomidate (AMIDATE) injection (30 mg Intravenous Given 04/29/21 2248)  rocuronium (ZEMURON) injection (100 mg Intravenous Given 04/29/21 2248)    ED Course  I have reviewed the triage vital signs and the nursing notes.  Pertinent labs & imaging results that were available during my care of the patient were reviewed by me and considered in my medical decision making (see chart for details).    MDM Rules/Calculators/A&P                          60 year old male presents the emergency department with altered mental status.  Reported headache previously.  Patient on arrival is very altered, combative and fighting staff.  Concern for acute intracranial pathology.  Decision was made to intubate the patient for safety and work-up.  Neurology is at bedside, patient intubated with glide scope on first pass with success.  Plan for CT  imaging and metabolic work-up.  Final Clinical Impression(s) / ED Diagnoses Final diagnoses:  None    Rx / DC Orders ED Discharge  Orders    None       Lorelle Gibbs, DO 04/29/21 2315

## 2021-04-29 NOTE — Consult Note (Addendum)
Neurology Consultation Reason for Consult: Code stroke for AMS, c/f inability to look right Requesting Physician: Lavenia Atlas  CC: Headache   History is obtained from: Wife and EMS  HPI: Hayden Mcbride is a 60 y.o. male with a past medical history significant for hypertension, sleep apnea on CPAP, obesity (35.91 BMI) thyroid disease (resolved), glaucoma.  Per wife he was in his usual state of health this morning and then at 1 or 2 PM began to complain of a severe headache as well as right ear pain.  He laid down which is atypical for him.  She reports they both work from home but she was not checking on him often as he was just laying in bed.  At one point she noted his CPAP was not on his face although it was on and running. She also found that he had fallen into their jaccuzzi tub at one point and then gotten back into bed still fully undressed. When she went to check on him later his speech was nonsensical (mostly grunting) and he was very lethargic so EMS was called.  He additionally had at least 1 episode of emesis at home.  On initial EMS arrival he was described as "catatonic" however he became progressively more agitated with movement, and by the time of arrival to the ED he required several people to hold him down due to his combativeness.  There was some concern for possible left gaze preference for which code stroke was activated.  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  ROS: Unable to obtain due to altered mental status.   Past Medical History:  Diagnosis Date  . Allergy   . Dry eyes   . GERD (gastroesophageal reflux disease)    . Glaucoma   . Hx of adenomatous colonic polyps 07/08/2006   06/2006 - diminutive adenoma 04/19/2015 - diminutive adenoma and one polyp lost - repeat colonoscopy 2021  . Hypertension   . Post-operative nausea and vomiting   . Sleep apnea    wears CPAP  . Thyroid disease    long ago- half thyroid removed ~20 yrs ago    Past Surgical History:  Procedure Laterality Date  . BREATH TEK H PYLORI N/A 12/24/2014   Procedure: BREATH TEK H PYLORI;  Surgeon: Alphonsa Overall, MD;  Location: [facility];  Service: General;  Laterality: N/A;  . CHOLECYSTECTOMY    . COLONOSCOPY    . EYE SURGERY     age 56   . 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 POLYPECTOMY    . SHOULDER SURGERY     left shoulder  . THYROID SURGERY     Current Outpatient Medications  Medication Instructions  . amLODipine (NORVASC) 5 mg, Oral, Daily  . ibuprofen (ADVIL) 200-400 mg, Oral, Every 6 hours PRN  . losartan (COZAAR) 100 mg, Oral, Daily  . Multiple Vitamins-Minerals (ONE-A-DAY MENS 50+) TABS 1 tablet, Oral, Daily  . omeprazole (PRILOSEC) 20 mg, Oral, Daily    Family History  Problem Relation Age of Onset  . Diverticulitis Mother   .  Colon cancer Father        in his 44's dx'd   . Cancer Maternal Grandmother   . Esophageal cancer Neg Hx   . Stomach cancer Neg Hx   . Rectal cancer Neg Hx   . Colon polyps Neg Hx    Social History:  reports that he quit smoking about 36 years ago. His smoking use included cigarettes. He quit after 10.00 years of use. He has never used smokeless tobacco. He reports current alcohol use of about 2.0 standard drinks of alcohol per week. He reports that he does not use drugs. Could not be confirmed personally with patient due to his altered mental status.  However wife reports no concern for substance use, and that he drinks a beer in the evening a few times a week.  She also reports that he still occasionally smokes cigars but she does not think he smokes cigarettes  anymore  Exam: Current vital signs: BP (!) 145/97   Pulse 74   Resp (!) 41   SpO2 96%  Vital signs in last 24 hours: Pulse Rate:  [74-86] 74 (05/17 2248) Resp:  [24-41] 41 (05/17 2248) BP: (145)/(97) 145/97 (05/17 2246) SpO2:  [96 %-100 %] 96 % (05/17 2252)   Physical Exam  Constitutional: Appears well-developed and well-nourished.  Psych: Wild and combative, sometimes calms down to voice Eyes: No scleral injection HENT: No oropharyngeal obstruction.  MSK: no joint deformities.  Cardiovascular: Tachycardic but regular Respiratory: Tachypneic but no grossly audible wheezing GI: Soft.  No distension.  Skin: Warm dry and intact visible skin  Neuro: Mental Status: Patient is awake, alert, with incoherent speech, somewhat word salad in nature but also with some automatic speech such as "come on now".  He does not follow most commands but at one point did stick out his tongue to command. Cranial Nerves: II: Visual Fields are difficult to assess given mental status and frequent blinking.  Pupils are equal, round, and reactive to light 3 to 2 mm III,IV, VI: EOMI without ptosis or diploplia.  No clear gaze preference, appears orient towards wherever there are more people/activity V: Facial sensation is symmetric to eyelash brush VII: Facial movement is symmetric.  VIII: hearing is intact to voice X/XI Unable to assess secondary to patient's mental status  XII: tongue is midline without atrophy or fasciculations.  Motor: Tone is normal. Bulk is normal.  He will not participate in formal strength testing but does not appear to have any focal weakness based on combative agitation. Sensory: Equally reactive to touch throughout all 4 extremities Deep Tendon Reflexes: 3+ and symmetric in the biceps and patellae.  Plantars: Toes are downgoing bilaterally.  Cerebellar: Unable to assess secondary to patient's mental status   NIHSS total 10 Score breakdown: 2 points for not answering  questions, one-point for following 1 of 2 commands, one-point for drift in all limbs (mostly due to agitation, unlikely to be weakness), 2 points for severe aphasia, one-point for dysarthria  I have reviewed labs in epic and the results pertinent to this consultation are: CMP notable for mild hypokalemia at 3.2, elevated total bilirubin at 2.6, mild hyperglycemia CBC notable for brisk leukocytosis to 18.3, elevated hemoglobin at 18.7, mildly low platelets at 142, UA notable for small hemoglobin pigment, negative for infection U tox positive for benzodiazepines (Versed given to patient prior to collection) Lactate elevated at 3.1  No results found for: TSH   I have reviewed the images obtained: Head CT personally reviewed,  no acute intracranial process CTA personally reviewed, no LVO CT perfusion personally reviewed, no tissue at risk  Significant incidental finding of 4.7 centimeter right thyroid nodule  Impression: 60 year old male with past records factors of hypertension and obstructive sleep apnea, obesity, prior smoking, presenting with acute mental status change today.  Given his language deficits and possible difficulty looking to the right, advanced imaging was pursued (CTA/CT perfusion) to confirm no branch left MCA occlusion or left PCA LVO.  Given overall history as well as leukocytosis, meningitis should be considered, versus toxic/metabolic process although labs including UDS are overall unrevealing other than elevated T bili.  Seizure is another possibility although low concern for ongoing seizure activity based on his examination at the time of arrival  Recommendations: -Appreciate CCM management of ventilator and other comorbidities, patient intubated to facilitate advanced imaging -Pharmacy consult for empiric meningitis coverage, acyclovir, vancomycin, ceftriaxone and ampicillin to be narrowed pending CSF results -Lumbar puncture for protein, glucose, bacterial culture, cell  counts and tubes 1 and 4, HSV 1/2; awaiting INR given evidence of liver dysfunction on CMP with elevated T bili -TSH pending -Routine EEG in the morning, ideally after extubation if patient seems appropriate for extubation   Lesleigh Noe MD-PhD [facility] [phone] Available 7 PM to 7 AM, outside of these hours please call Neurologist on call as listed on Amion.  Total critical care time: 105 minutes   Critical care time was exclusive of separately billable procedures and treating other patients.   Critical care was necessary to treat or prevent imminent or life-threatening deterioration.   Critical care was time spent personally by me on the following activities: development of treatment plan with patient and/or surrogate as well as nursing, discussions with consultants/primary team, evaluation of patient's response to treatment, examination of patient, obtaining history from patient or surrogate, ordering and performing treatments and interventions, ordering and review of laboratory studies, ordering and review of radiographic studies, and re-evaluation of patient's condition as needed, as documented above.

## 2021-04-29 NOTE — ED Notes (Signed)
Patient transported to CT 

## 2021-04-29 NOTE — ED Notes (Signed)
Delay in CT tx due to pt combative. In room on stretcher for emergent itubation

## 2021-04-29 NOTE — Progress Notes (Signed)
Pharmacy Antibiotic Note  BENINO KORINEK is a 60 y.o. male admitted on 04/29/2021 presenting as code stroke, concern for meningitis.  Pharmacy has been consulted for vancomycin, ampicillin dosing.    Plan: Vancomycin 2000 mg IV x 1, then 1000 mg IV q 12h (target vancomycin trough 15-20) Ampicillin 2g IV every 4 hours Monitor renal function, CSF results and meningitis workup Vancomycin levels as needed  Height: 5\' 11"  (180.3 cm) Weight: 116.8 kg (257 lb 8 oz) IBW/kg (Calculated) : 75.3  Temp (24hrs), Avg:98.2 F (36.8 C), Min:98.2 F (36.8 C), Max:98.2 F (36.8 C)  Recent Labs  Lab 04/29/21 2234 04/29/21 2240  WBC 18.3*  --   CREATININE 1.05 0.80    Estimated Creatinine Clearance: 126 mL/min (by C-G formula based on SCr of 0.8 mg/dL).    Allergies  Allergen Reactions  . Demerol [Meperidine]     Large doses cause nausea and vomiting    Bertis Ruddy, PharmD Clinical Pharmacist ED Pharmacist Phone # [phone] 04/29/2021 11:33 PM

## 2021-04-29 NOTE — ED Notes (Signed)
CBG 152. New pt override was done to complete the cbg.

## 2021-04-29 NOTE — Code Documentation (Signed)
Paged for Code Stroke  Upon patient arrival patient altered and combative with word salad and slurred speech.  LKW 0900 and patient did have a fall complaining of ear pain. Patient has extensive hypertension history with LVH. Unable to obtain NIH and patient unable to sit still for scan so MD opted to intubate patient for safety.  Assisted with [facility]SI med admin and accompanied RT, RN and tech to CT with patient.  Patient head CT negative.  Patient was getting a CTA/CT perfusion when this RN signed off.

## 2021-04-30 ENCOUNTER — Inpatient Hospital Stay (HOSPITAL_COMMUNITY): Payer: Commercial Managed Care - PPO

## 2021-04-30 ENCOUNTER — Other Ambulatory Visit: Payer: Self-pay

## 2021-04-30 ENCOUNTER — Encounter (HOSPITAL_COMMUNITY): Payer: Self-pay | Admitting: Pulmonary Disease

## 2021-04-30 DIAGNOSIS — I4892 Unspecified atrial flutter: Secondary | ICD-10-CM | POA: Diagnosis not present

## 2021-04-30 DIAGNOSIS — D72829 Elevated white blood cell count, unspecified: Secondary | ICD-10-CM | POA: Insufficient documentation

## 2021-04-30 DIAGNOSIS — D6959 Other secondary thrombocytopenia: Secondary | ICD-10-CM | POA: Diagnosis not present

## 2021-04-30 DIAGNOSIS — H409 Unspecified glaucoma: Secondary | ICD-10-CM | POA: Diagnosis present

## 2021-04-30 DIAGNOSIS — G001 Pneumococcal meningitis: Secondary | ICD-10-CM | POA: Diagnosis present

## 2021-04-30 DIAGNOSIS — J9601 Acute respiratory failure with hypoxia: Secondary | ICD-10-CM | POA: Diagnosis present

## 2021-04-30 DIAGNOSIS — W19XXXA Unspecified fall, initial encounter: Secondary | ICD-10-CM | POA: Diagnosis present

## 2021-04-30 DIAGNOSIS — G4733 Obstructive sleep apnea (adult) (pediatric): Secondary | ICD-10-CM | POA: Diagnosis present

## 2021-04-30 DIAGNOSIS — E870 Hyperosmolality and hypernatremia: Secondary | ICD-10-CM | POA: Diagnosis not present

## 2021-04-30 DIAGNOSIS — Z6835 Body mass index (BMI) 35.0-35.9, adult: Secondary | ICD-10-CM | POA: Diagnosis not present

## 2021-04-30 DIAGNOSIS — I10 Essential (primary) hypertension: Secondary | ICD-10-CM | POA: Diagnosis present

## 2021-04-30 DIAGNOSIS — H9191 Unspecified hearing loss, right ear: Secondary | ICD-10-CM | POA: Diagnosis present

## 2021-04-30 DIAGNOSIS — G0490 Encephalitis and encephalomyelitis, unspecified: Secondary | ICD-10-CM | POA: Diagnosis present

## 2021-04-30 DIAGNOSIS — Z79899 Other long term (current) drug therapy: Secondary | ICD-10-CM | POA: Diagnosis not present

## 2021-04-30 DIAGNOSIS — R509 Fever, unspecified: Secondary | ICD-10-CM | POA: Diagnosis not present

## 2021-04-30 DIAGNOSIS — Z4659 Encounter for fitting and adjustment of other gastrointestinal appliance and device: Secondary | ICD-10-CM | POA: Diagnosis not present

## 2021-04-30 DIAGNOSIS — Z20822 Contact with and (suspected) exposure to covid-19: Secondary | ICD-10-CM | POA: Diagnosis present

## 2021-04-30 DIAGNOSIS — J9811 Atelectasis: Secondary | ICD-10-CM | POA: Diagnosis not present

## 2021-04-30 DIAGNOSIS — R5381 Other malaise: Secondary | ICD-10-CM | POA: Diagnosis not present

## 2021-04-30 DIAGNOSIS — R4182 Altered mental status, unspecified: Secondary | ICD-10-CM | POA: Diagnosis present

## 2021-04-30 DIAGNOSIS — G5622 Lesion of ulnar nerve, left upper limb: Secondary | ICD-10-CM | POA: Diagnosis not present

## 2021-04-30 DIAGNOSIS — K219 Gastro-esophageal reflux disease without esophagitis: Secondary | ICD-10-CM | POA: Diagnosis present

## 2021-04-30 DIAGNOSIS — J15211 Pneumonia due to Methicillin susceptible Staphylococcus aureus: Secondary | ICD-10-CM | POA: Diagnosis not present

## 2021-04-30 DIAGNOSIS — G009 Bacterial meningitis, unspecified: Secondary | ICD-10-CM | POA: Diagnosis not present

## 2021-04-30 DIAGNOSIS — J9600 Acute respiratory failure, unspecified whether with hypoxia or hypercapnia: Secondary | ICD-10-CM | POA: Diagnosis not present

## 2021-04-30 DIAGNOSIS — Z9049 Acquired absence of other specified parts of digestive tract: Secondary | ICD-10-CM | POA: Diagnosis not present

## 2021-04-30 DIAGNOSIS — E669 Obesity, unspecified: Secondary | ICD-10-CM | POA: Diagnosis present

## 2021-04-30 DIAGNOSIS — A403 Sepsis due to Streptococcus pneumoniae: Secondary | ICD-10-CM | POA: Diagnosis present

## 2021-04-30 DIAGNOSIS — B370 Candidal stomatitis: Secondary | ICD-10-CM | POA: Diagnosis present

## 2021-04-30 DIAGNOSIS — R1312 Dysphagia, oropharyngeal phase: Secondary | ICD-10-CM | POA: Diagnosis not present

## 2021-04-30 DIAGNOSIS — G039 Meningitis, unspecified: Secondary | ICD-10-CM | POA: Diagnosis not present

## 2021-04-30 DIAGNOSIS — E872 Acidosis: Secondary | ICD-10-CM | POA: Diagnosis not present

## 2021-04-30 DIAGNOSIS — I4891 Unspecified atrial fibrillation: Secondary | ICD-10-CM | POA: Diagnosis not present

## 2021-04-30 DIAGNOSIS — Z86010 Personal history of colonic polyps: Secondary | ICD-10-CM | POA: Diagnosis not present

## 2021-04-30 DIAGNOSIS — T17908D Unspecified foreign body in respiratory tract, part unspecified causing other injury, subsequent encounter: Secondary | ICD-10-CM | POA: Diagnosis not present

## 2021-04-30 DIAGNOSIS — J15212 Pneumonia due to Methicillin resistant Staphylococcus aureus: Secondary | ICD-10-CM | POA: Diagnosis not present

## 2021-04-30 DIAGNOSIS — G929 Unspecified toxic encephalopathy: Secondary | ICD-10-CM | POA: Diagnosis not present

## 2021-04-30 DIAGNOSIS — R410 Disorientation, unspecified: Secondary | ICD-10-CM | POA: Diagnosis not present

## 2021-04-30 DIAGNOSIS — J95851 Ventilator associated pneumonia: Secondary | ICD-10-CM | POA: Diagnosis not present

## 2021-04-30 DIAGNOSIS — Z95828 Presence of other vascular implants and grafts: Secondary | ICD-10-CM | POA: Diagnosis not present

## 2021-04-30 DIAGNOSIS — N179 Acute kidney failure, unspecified: Secondary | ICD-10-CM | POA: Diagnosis not present

## 2021-04-30 DIAGNOSIS — Y848 Other medical procedures as the cause of abnormal reaction of the patient, or of later complication, without mention of misadventure at the time of the procedure: Secondary | ICD-10-CM | POA: Diagnosis not present

## 2021-04-30 LAB — CSF CELL COUNT WITH DIFFERENTIAL
Eosinophils, CSF: 0 % (ref 0–1)
Eosinophils, CSF: 1 % (ref 0–1)
Lymphs, CSF: 0 % — ABNORMAL LOW (ref 40–80)
Lymphs, CSF: 1 % — ABNORMAL LOW (ref 40–80)
Monocyte-Macrophage-Spinal Fluid: 0 % — ABNORMAL LOW (ref 15–45)
Monocyte-Macrophage-Spinal Fluid: 0 % — ABNORMAL LOW (ref 15–45)
RBC Count, CSF: 110 /mm3 — ABNORMAL HIGH
RBC Count, CSF: 91 /mm3 — ABNORMAL HIGH
Segmented Neutrophils-CSF: 99 % — ABNORMAL HIGH (ref 0–6)
Segmented Neutrophils-CSF: 99 % — ABNORMAL HIGH (ref 0–6)
Tube #: 1
Tube #: 3
WBC, CSF: 510 /mm3 (ref 0–5)
WBC, CSF: 585 /mm3 (ref 0–5)

## 2021-04-30 LAB — LACTIC ACID, PLASMA
Lactic Acid, Venous: 2.8 mmol/L (ref 0.5–1.9)
Lactic Acid, Venous: 3.1 mmol/L (ref 0.5–1.9)

## 2021-04-30 LAB — GLUCOSE, CAPILLARY
Glucose-Capillary: 133 mg/dL — ABNORMAL HIGH (ref 70–99)
Glucose-Capillary: 134 mg/dL — ABNORMAL HIGH (ref 70–99)
Glucose-Capillary: 137 mg/dL — ABNORMAL HIGH (ref 70–99)
Glucose-Capillary: 144 mg/dL — ABNORMAL HIGH (ref 70–99)
Glucose-Capillary: 153 mg/dL — ABNORMAL HIGH (ref 70–99)
Glucose-Capillary: 176 mg/dL — ABNORMAL HIGH (ref 70–99)

## 2021-04-30 LAB — I-STAT ARTERIAL BLOOD GAS, ED
Acid-base deficit: 2 mmol/L (ref 0.0–2.0)
Bicarbonate: 23.8 mmol/L (ref 20.0–28.0)
Calcium, Ion: 1.22 mmol/L (ref 1.15–1.40)
HCT: 54 % — ABNORMAL HIGH (ref 39.0–52.0)
Hemoglobin: 18.4 g/dL — ABNORMAL HIGH (ref 13.0–17.0)
O2 Saturation: 100 %
Patient temperature: 98.2
Potassium: 2.8 mmol/L — ABNORMAL LOW (ref 3.5–5.1)
Sodium: 141 mmol/L (ref 135–145)
TCO2: 25 mmol/L (ref 22–32)
pCO2 arterial: 43.4 mmHg (ref 32.0–48.0)
pH, Arterial: 7.345 — ABNORMAL LOW (ref 7.350–7.450)
pO2, Arterial: 342 mmHg — ABNORMAL HIGH (ref 83.0–108.0)

## 2021-04-30 LAB — RAPID URINE DRUG SCREEN, HOSP PERFORMED
Amphetamines: NOT DETECTED
Barbiturates: NOT DETECTED
Benzodiazepines: POSITIVE — AB
Cocaine: NOT DETECTED
Opiates: NOT DETECTED
Tetrahydrocannabinol: NOT DETECTED

## 2021-04-30 LAB — CBC
HCT: 51.5 % (ref 39.0–52.0)
Hemoglobin: 17.4 g/dL — ABNORMAL HIGH (ref 13.0–17.0)
MCH: 31.6 pg (ref 26.0–34.0)
MCHC: 33.8 g/dL (ref 30.0–36.0)
MCV: 93.5 fL (ref 80.0–100.0)
Platelets: 145 10*3/uL — ABNORMAL LOW (ref 150–400)
RBC: 5.51 MIL/uL (ref 4.22–5.81)
RDW: 13 % (ref 11.5–15.5)
WBC: 18.9 10*3/uL — ABNORMAL HIGH (ref 4.0–10.5)
nRBC: 0 % (ref 0.0–0.2)

## 2021-04-30 LAB — URINALYSIS, ROUTINE W REFLEX MICROSCOPIC
Bacteria, UA: NONE SEEN
Bilirubin Urine: NEGATIVE
Glucose, UA: 50 mg/dL — AB
Ketones, ur: 5 mg/dL — AB
Leukocytes,Ua: NEGATIVE
Nitrite: NEGATIVE
Protein, ur: NEGATIVE mg/dL
Specific Gravity, Urine: 1.033 — ABNORMAL HIGH (ref 1.005–1.030)
pH: 6 (ref 5.0–8.0)

## 2021-04-30 LAB — BASIC METABOLIC PANEL
Anion gap: 9 (ref 5–15)
BUN: 18 mg/dL (ref 8–23)
CO2: 23 mmol/L (ref 22–32)
Calcium: 8.5 mg/dL — ABNORMAL LOW (ref 8.9–10.3)
Chloride: 106 mmol/L (ref 98–111)
Creatinine, Ser: 1.13 mg/dL (ref 0.61–1.24)
GFR, Estimated: >60 mL/min (ref >60)
Glucose, Bld: 127 mg/dL — ABNORMAL HIGH (ref 70–99)
Potassium: 3.9 mmol/L (ref 3.5–5.1)
Sodium: 138 mmol/L (ref 135–145)

## 2021-04-30 LAB — MAGNESIUM: Magnesium: 1.6 mg/dL — ABNORMAL LOW (ref 1.7–2.4)

## 2021-04-30 LAB — MRSA PCR SCREENING: MRSA by PCR: POSITIVE — AB

## 2021-04-30 LAB — PROTIME-INR
INR: 1.4 — ABNORMAL HIGH (ref 0.8–1.2)
Prothrombin Time: 17.2 seconds — ABNORMAL HIGH (ref 11.4–15.2)

## 2021-04-30 LAB — SARS CORONAVIRUS 2 (TAT 6-24 HRS): SARS Coronavirus 2: NEGATIVE

## 2021-04-30 LAB — APTT: aPTT: 29 seconds (ref 24–36)

## 2021-04-30 LAB — PROTEIN AND GLUCOSE, CSF
Glucose, CSF: <20 mg/dL — CL (ref 40–70)
Total  Protein, CSF: >600 mg/dL — ABNORMAL HIGH (ref 15–45)

## 2021-04-30 LAB — PATHOLOGIST SMEAR REVIEW

## 2021-04-30 LAB — TSH: TSH: 4.843 u[IU]/mL — ABNORMAL HIGH (ref 0.350–4.500)

## 2021-04-30 LAB — HEMOGLOBIN A1C
Hgb A1c MFr Bld: 5.4 % (ref 4.8–5.6)
Mean Plasma Glucose: 108.28 mg/dL

## 2021-04-30 LAB — HIV ANTIBODY (ROUTINE TESTING W REFLEX): HIV Screen 4th Generation wRfx: NONREACTIVE

## 2021-04-30 LAB — CRYPTOCOCCAL ANTIGEN, CSF: Crypto Ag: NEGATIVE

## 2021-04-30 LAB — PHOSPHORUS: Phosphorus: 3.3 mg/dL (ref 2.5–4.6)

## 2021-04-30 IMAGING — DX DG ABD PORTABLE 1V
1 series · 1 of 1 positions shown · non-contrast
Comparison: None

CLINICAL DATA: Check gastric catheter placement

EXAM:
PORTABLE ABDOMEN - 1 VIEW

[abdomen]
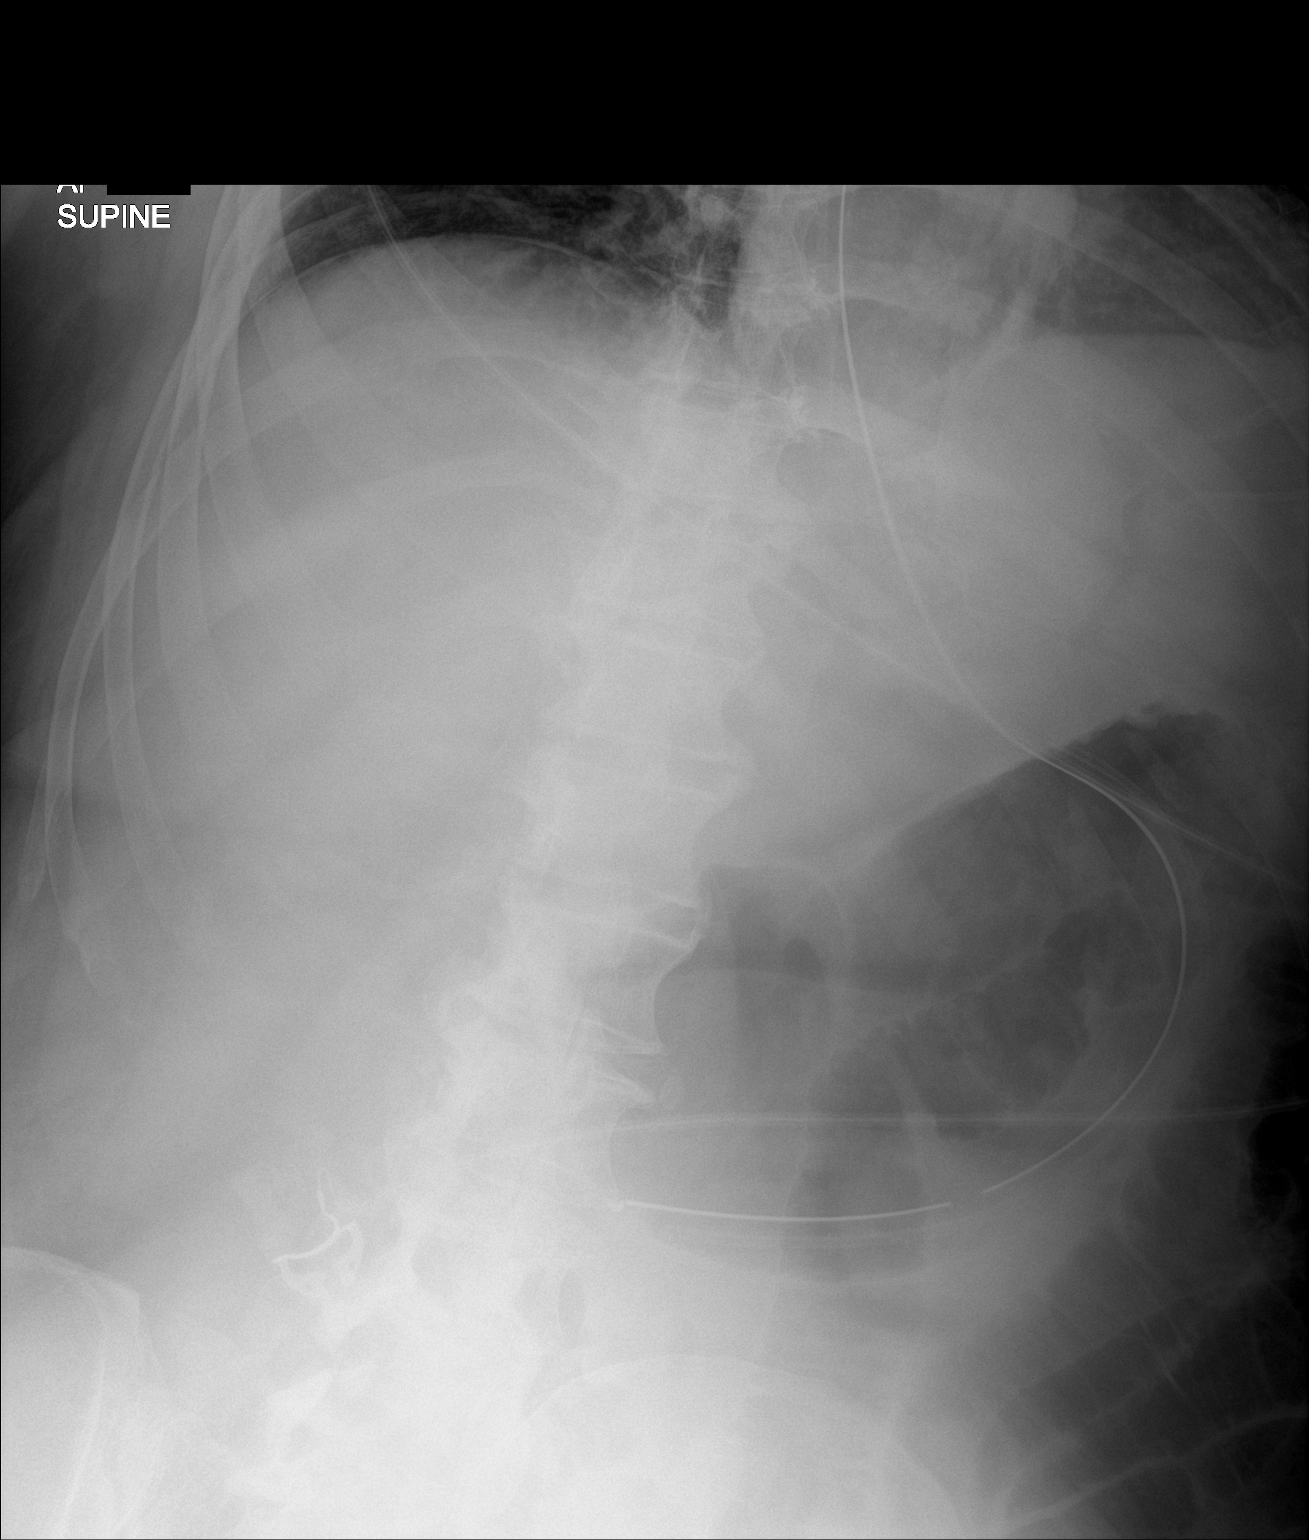

[1 of 1 positions shown; findings below may reference images not displayed]

FINDINGS: Gastric catheter is noted in the distal stomach in satisfactory
position. Scattered mildly dilated loops of small bowel are noted.
IMPRESSION: Gastric catheter as described.

Findings consistent with mild small bowel dilatation.

## 2021-04-30 IMAGING — MR MR HEAD WO/W CM
16 of 18 series · 42 of 48 positions shown · IV contrast (gadavist)
Comparison: CT head and CTA head and neck yesterday.

CLINICAL DATA: 60-year-old male code stroke presentation. Altered
mental status and headache, agitation. Encephalitis suspected
clinically, with elevated CSF opening pressure and xanthochromia on
lumbar puncture.

EXAM:
MRI HEAD WITHOUT AND WITH CONTRAST
TECHNIQUE: Multiplanar, multiecho pulse sequences of the brain and surrounding
structures were obtained without and with intravenous contrast.
CONTRAST:  10mL GADAVIST GADOBUTROL 1 MMOL/ML IV SOLN

[Series 5: DWI · axial · 3.0mm · 0.88mm/px · z∈[-51,+92]mm · 5 of 104 slices shown (1 of 6)]
[im 1/104]
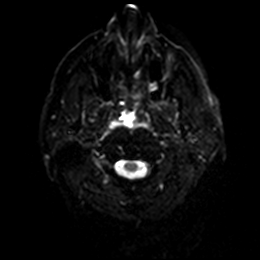
[im 26/104]
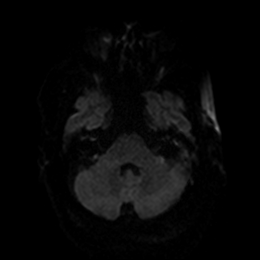
[im 52/104]
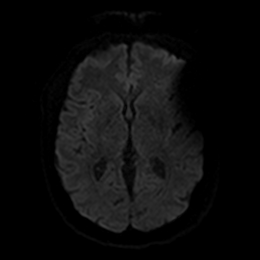
[im 78/104]
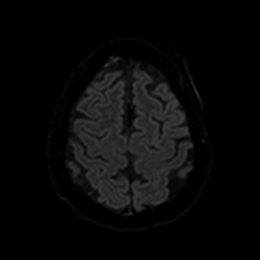
[im 104/104]
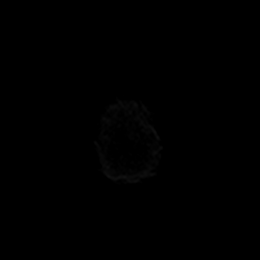

[Series 6: DWI · axial · 3.0mm · 0.88mm/px · z∈[-51,+92]mm · 3 of 51 slices shown (2 of 6)]
[im 1/51]
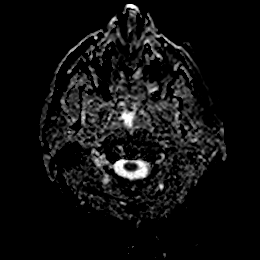
[im 26/51]
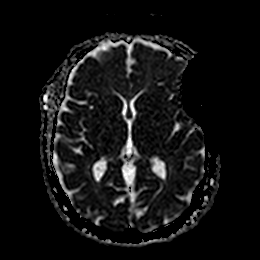
[im 51/51]
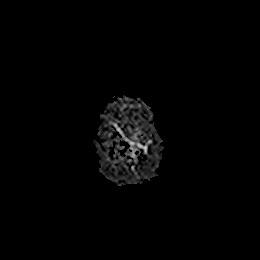

[Series 7: DWI · axial · 3.0mm · 0.88mm/px · z∈[-51,+92]mm · 6 of 102 slices shown (3 of 6)]
[im 1/102]
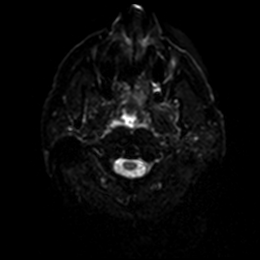
[im 21/102]
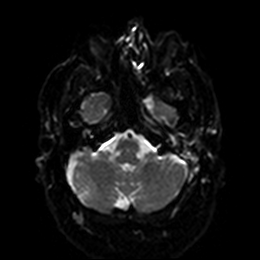
[im 41/102]
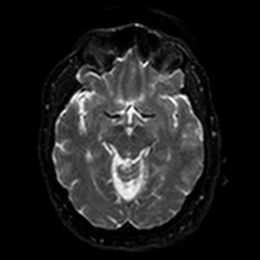
[im 61/102]
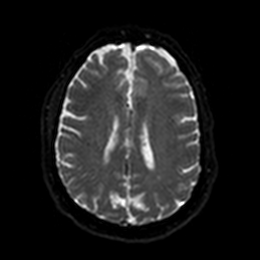
[im 81/102]
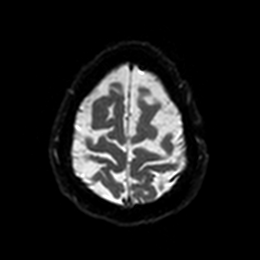
[im 102/102]
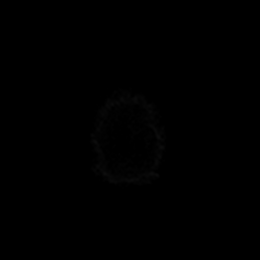

[Series 8: DWI · axial · 3.0mm · 0.88mm/px · z∈[-51,+92]mm · 3 of 52 slices shown (4 of 6)]
[im 1/52]
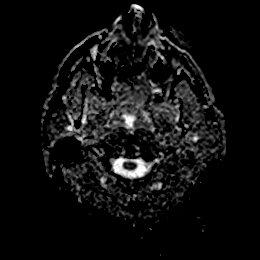
[im 26/52]
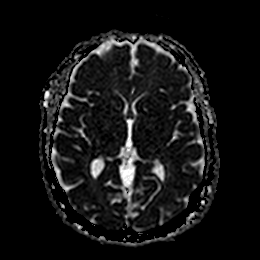
[im 52/52]
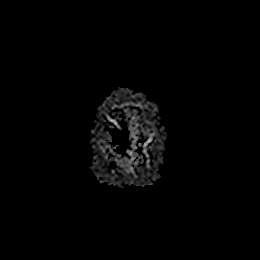

[Series 9: DWI · coronal · 4.0mm · 0.88mm/px · 4 of 72 slices shown (5 of 6)]
[im 1/72]
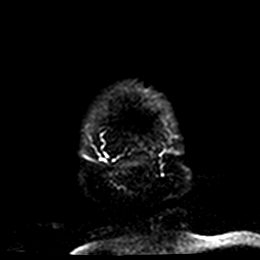
[im 24/72]
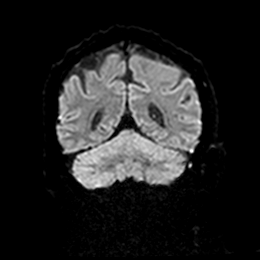
[im 48/72]
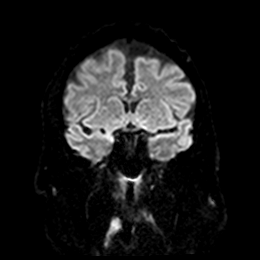
[im 72/72]
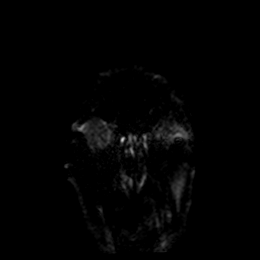

[Series 10: DWI · coronal · 4.0mm · 0.88mm/px · 2 of 36 slices shown (6 of 6)]
[im 1/36]
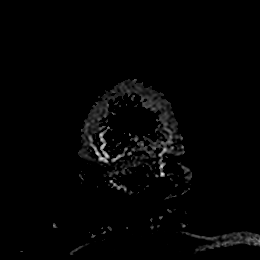
[im 36/36]
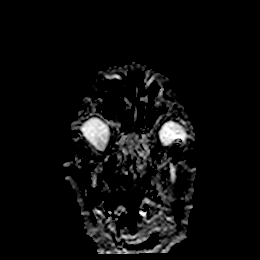

[Series 11: T1 · sagittal · 5.0mm · 0.75mm/px · 1 of 25 slices shown]
[im 1/25]
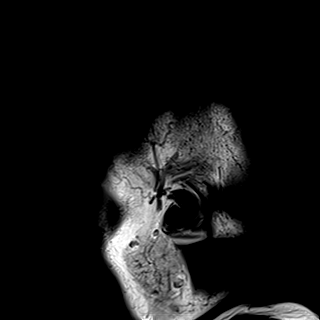

[Series 12: T2 · axial · 5.0mm · 0.72mm/px · 1 of 27 slices shown]
[im 1/27]
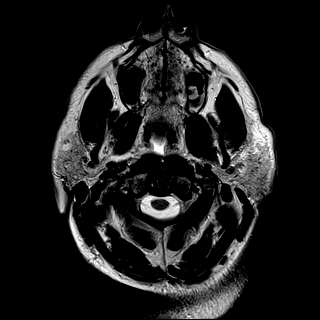

[Series 13: FLAIR · axial · 5.0mm · 0.45mm/px · 1 of 27 slices shown]
[im 1/27]
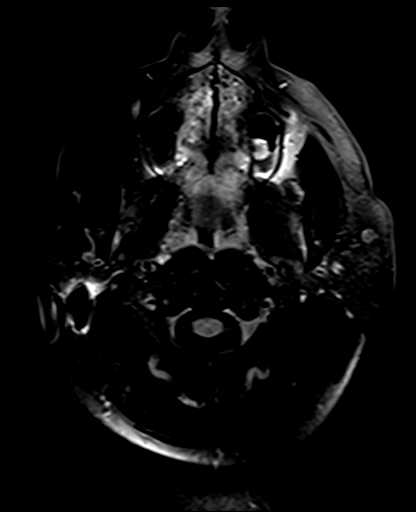

[Series 14: mag_images · axial · 3.0mm · 0.90mm/px · z∈[-53,+90]mm · 3 of 52 slices shown]
[im 1/52]
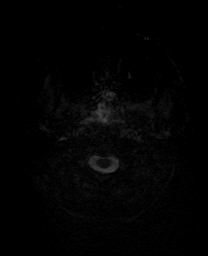
[im 26/52]
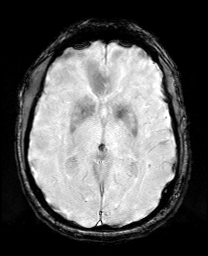
[im 52/52]
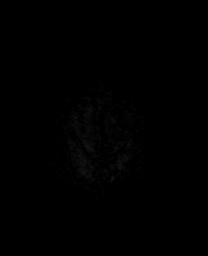

[Series 15: pha_images · axial · 3.0mm · 0.90mm/px · z∈[-53,+90]mm · 3 of 52 slices shown]
[im 1/52]
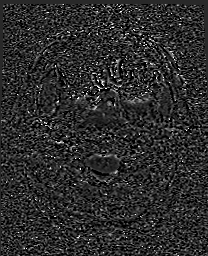
[im 26/52]
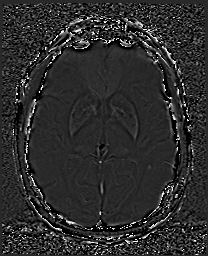
[im 52/52]
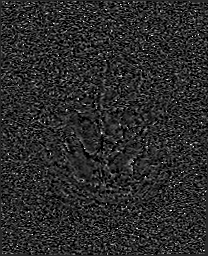

[Series 16: swi_images · axial · 3.0mm · 0.90mm/px · z∈[-53,+90]mm · 3 of 52 slices shown]
[im 1/52]
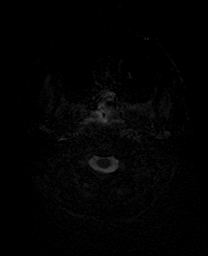
[im 26/52]
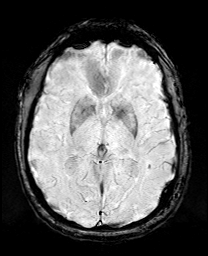
[im 52/52]
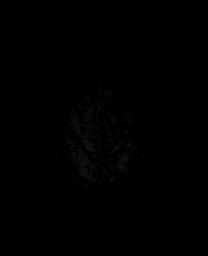

[Series 17: mip_images(sw) · axial · 24.0mm · 0.90mm/px · z∈[-44,+80]mm · 2 of 45 slices shown]
[im 1/45]
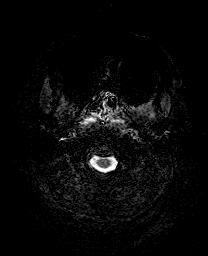
[im 45/45]
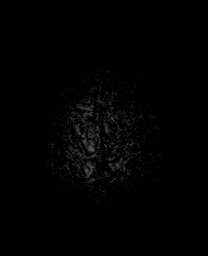

[Series 19: T2 post-contrast · coronal · 5.0mm · 0.72mm/px · 2 of 30 slices shown]
[im 1/30]
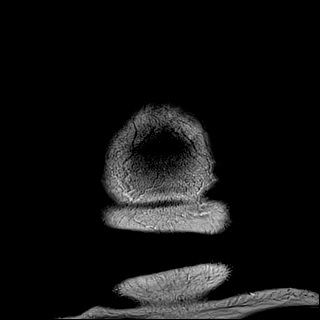
[im 30/30]
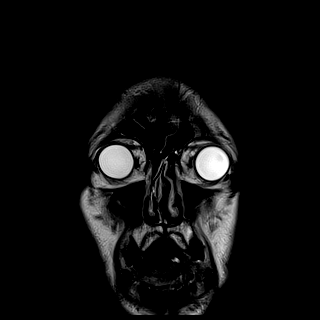

[Series 21: T1 post-contrast · coronal · 5.0mm · 0.34mm/px · 2 of 30 slices shown (1 of 2)]
[im 1/30]
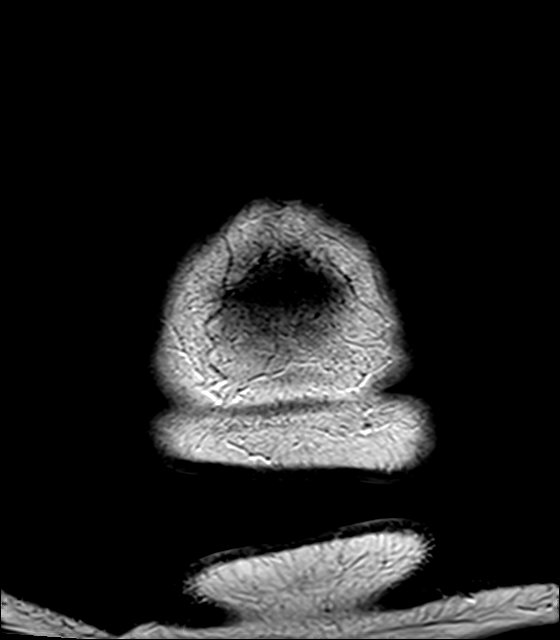
[im 30/30]
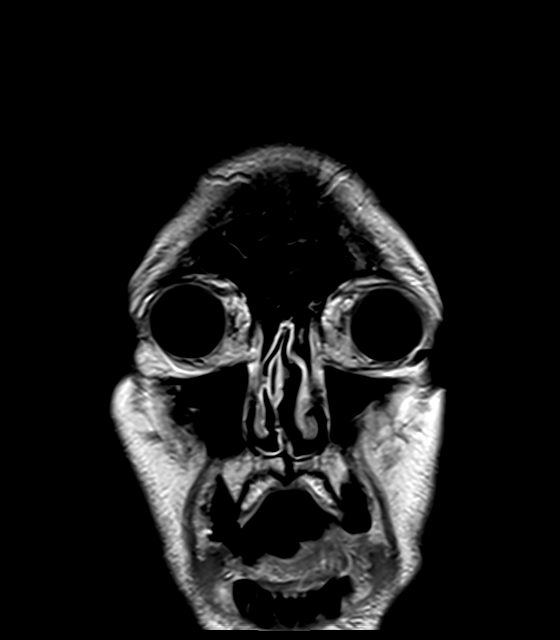

[Series 22: T1 post-contrast · sagittal · 5.0mm · 0.72mm/px · 1 of 25 slices shown (2 of 2)]
[im 1/25]
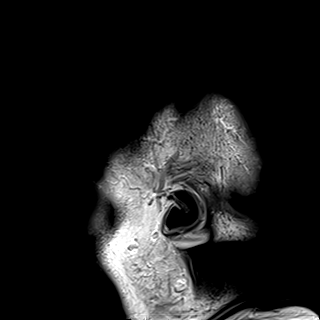

[42 of 48 positions shown; findings below may reference images not displayed]

FINDINGS: Brain: Susceptibility artifact along the left lateral face appears
to only affect axial DWI and is of unclear etiology.

There is diffusely abnormal FLAIR hyperintensity throughout the
cerebral sulci (series 13, image 20) associated with diffuse smooth
leptomeningeal enhancement following contrast (series 21, image 11).
No pachymeningeal thickening identified. No discrete cerebral edema.
No ventriculomegaly or intraventricular debris identified. No
ependymal enhancement.

There is a cluster of punctate abnormal subarachnoid space diffusion
at the posterior midline near the parieto-occipital sulcus on series
5, image 83. No discrete abnormality here on the other sequences.

No superimposed restricted diffusion suggestive of acute infarction.
No midline shift, mass effect, evidence of mass lesion,
ventriculomegaly, extra-axial collection or acute intracranial
hemorrhage. Cervicomedullary junction and pituitary are within
normal limits.

Vascular: Major intracranial vascular flow voids are preserved. The
major dural venous sinuses are enhancing and appear to be patent.

Skull and upper cervical spine: Negative visible cervical spine and
spinal cord. Visualized bone marrow signal is within normal limits.

Sinuses/Orbits: Disconjugate gaze. Otherwise negative orbits. Trace
paranasal sinus mucosal thickening.

Other: Intubated with fluid in the pharynx and nasal cavity. Mild
left mastoid effusion.
IMPRESSION: Diffuse leptomeningeal enhancement and abnormal FLAIR signal
throughout the subarachnoid spaces compatible with an Acute
Meningitis.
Punctate DWI foci along the midline parieto-occipital sulcus might
indicate trace purulence. But there is no drainable empyema and no
focal parenchymal encephalitis.
Also no evidence of acute infarct.

## 2021-04-30 MED ORDER — MIDAZOLAM HCL 2 MG/2ML IJ SOLN
4.0000 mg | Freq: Once | INTRAMUSCULAR | Status: AC
  Administered 2021-04-30: 4 mg via INTRAVENOUS
  Filled 2021-04-30: qty 4

## 2021-04-30 MED ORDER — CHLORHEXIDINE GLUCONATE 0.12% ORAL RINSE (MEDLINE KIT)
15.0000 mL | Freq: Two times a day (BID) | OROMUCOSAL | Status: DC
  Administered 2021-04-30 – 2021-05-10 (×21): 15 mL via OROMUCOSAL

## 2021-04-30 MED ORDER — POTASSIUM CHLORIDE 10 MEQ/100ML IV SOLN: 10.0000 meq | INTRAVENOUS | Status: DC

## 2021-04-30 MED ORDER — LACTATED RINGERS IV SOLN: INTRAVENOUS | Status: DC

## 2021-04-30 MED ORDER — CHLORHEXIDINE GLUCONATE CLOTH 2 % EX PADS
6.0000 | MEDICATED_PAD | Freq: Every day | CUTANEOUS | Status: DC
  Administered 2021-04-30 – 2021-05-10 (×11): 6 via TOPICAL

## 2021-04-30 MED ORDER — GADOBUTROL 1 MMOL/ML IV SOLN
10.0000 mL | Freq: Once | INTRAVENOUS | Status: AC | PRN
  Administered 2021-04-30: 10 mL via INTRAVENOUS

## 2021-04-30 MED ORDER — SODIUM CHLORIDE 0.9 % IV SOLN
INTRAVENOUS | Status: DC | PRN
  Administered 2021-04-30 – 2021-05-04 (×2): 250 mL via INTRAVENOUS

## 2021-04-30 MED ORDER — ORAL CARE MOUTH RINSE
15.0000 mL | OROMUCOSAL | Status: DC
  Administered 2021-04-30 – 2021-05-10 (×104): 15 mL via OROMUCOSAL

## 2021-04-30 MED ORDER — POLYETHYLENE GLYCOL 3350 17 G PO PACK: 17.0000 g | PACK | Freq: Every day | ORAL | Status: DC | PRN

## 2021-04-30 MED ORDER — DOCUSATE SODIUM 100 MG PO CAPS: 100.0000 mg | ORAL_CAPSULE | Freq: Two times a day (BID) | ORAL | Status: DC | PRN

## 2021-04-30 MED ORDER — INSULIN ASPART 100 UNIT/ML IJ SOLN
0.0000 [IU] | INTRAMUSCULAR | Status: DC
  Administered 2021-04-30 (×3): 2 [IU] via SUBCUTANEOUS
  Administered 2021-04-30 – 2021-05-01 (×2): 3 [IU] via SUBCUTANEOUS
  Administered 2021-05-01: 2 [IU] via SUBCUTANEOUS
  Administered 2021-05-01 (×2): 3 [IU] via SUBCUTANEOUS
  Administered 2021-05-01 (×2): 2 [IU] via SUBCUTANEOUS
  Administered 2021-05-01: 3 [IU] via SUBCUTANEOUS
  Administered 2021-05-02: 2 [IU] via SUBCUTANEOUS
  Administered 2021-05-02: 3 [IU] via SUBCUTANEOUS
  Administered 2021-05-02 (×4): 2 [IU] via SUBCUTANEOUS
  Administered 2021-05-03 – 2021-05-04 (×8): 3 [IU] via SUBCUTANEOUS
  Administered 2021-05-04 (×2): 5 [IU] via SUBCUTANEOUS
  Administered 2021-05-04 (×2): 3 [IU] via SUBCUTANEOUS
  Administered 2021-05-05 (×2): 5 [IU] via SUBCUTANEOUS
  Administered 2021-05-05 – 2021-05-06 (×9): 3 [IU] via SUBCUTANEOUS
  Administered 2021-05-06: 2 [IU] via SUBCUTANEOUS
  Administered 2021-05-07: 3 [IU] via SUBCUTANEOUS
  Administered 2021-05-07 (×2): 5 [IU] via SUBCUTANEOUS
  Administered 2021-05-07 – 2021-05-08 (×4): 3 [IU] via SUBCUTANEOUS
  Administered 2021-05-08: 5 [IU] via SUBCUTANEOUS

## 2021-04-30 MED ORDER — MUPIROCIN 2 % EX OINT
1.0000 "application " | TOPICAL_OINTMENT | Freq: Two times a day (BID) | CUTANEOUS | Status: AC
  Administered 2021-04-30 – 2021-05-04 (×10): 1 via NASAL
  Filled 2021-04-30 (×2): qty 22

## 2021-04-30 MED ORDER — VITAL AF 1.2 CAL PO LIQD
1000.0000 mL | ORAL | Status: DC
  Administered 2021-04-30 – 2021-05-09 (×9): 1000 mL
  Filled 2021-04-30 (×7): qty 1000

## 2021-04-30 MED ORDER — POLYETHYLENE GLYCOL 3350 17 G PO PACK
17.0000 g | PACK | Freq: Every day | ORAL | Status: DC | PRN
  Administered 2021-05-05 – 2021-05-06 (×2): 17 g
  Filled 2021-04-30 (×2): qty 1

## 2021-04-30 MED ORDER — PANTOPRAZOLE SODIUM 40 MG IV SOLR
40.0000 mg | Freq: Every day | INTRAVENOUS | Status: DC
  Administered 2021-04-30: 40 mg via INTRAVENOUS
  Filled 2021-04-30: qty 40

## 2021-04-30 MED ORDER — NOREPINEPHRINE 4 MG/250ML-% IV SOLN
2.0000 ug/min | INTRAVENOUS | Status: DC
  Administered 2021-04-30: 2 ug/min via INTRAVENOUS

## 2021-04-30 MED ORDER — DEXAMETHASONE SODIUM PHOSPHATE 10 MG/ML IJ SOLN
6.0000 mg | INTRAMUSCULAR | Status: AC
  Administered 2021-04-30: 6 mg via INTRAVENOUS
  Filled 2021-04-30: qty 1

## 2021-04-30 MED ORDER — VECURONIUM BROMIDE 10 MG IV SOLR
5.0000 mg | Freq: Once | INTRAVENOUS | Status: AC
  Administered 2021-04-30: 5 mg via INTRAVENOUS
  Filled 2021-04-30: qty 10

## 2021-04-30 MED ORDER — LORAZEPAM 2 MG/ML IJ SOLN
2.0000 mg | Freq: Four times a day (QID) | INTRAMUSCULAR | Status: DC | PRN
  Administered 2021-04-30 – 2021-05-09 (×8): 2 mg via INTRAVENOUS
  Filled 2021-04-30 (×9): qty 1

## 2021-04-30 MED ORDER — PANTOPRAZOLE SODIUM 40 MG PO PACK
40.0000 mg | PACK | Freq: Every day | ORAL | Status: DC
  Administered 2021-04-30 – 2021-05-09 (×10): 40 mg
  Filled 2021-04-30 (×10): qty 20

## 2021-04-30 MED ORDER — POTASSIUM CHLORIDE 10 MEQ/100ML IV SOLN
10.0000 meq | INTRAVENOUS | Status: AC
  Administered 2021-04-30 (×3): 10 meq via INTRAVENOUS
  Filled 2021-04-30 (×3): qty 100

## 2021-04-30 MED ORDER — SODIUM CHLORIDE 0.9 % IV SOLN: INTRAVENOUS | Status: DC

## 2021-04-30 MED ORDER — MAGNESIUM SULFATE 4 GM/100ML IV SOLN
4.0000 g | Freq: Once | INTRAVENOUS | Status: AC
  Administered 2021-04-30: 4 g via INTRAVENOUS
  Filled 2021-04-30: qty 100

## 2021-04-30 MED ORDER — DEXAMETHASONE SODIUM PHOSPHATE 10 MG/ML IJ SOLN
10.0000 mg | Freq: Four times a day (QID) | INTRAMUSCULAR | Status: AC
  Administered 2021-04-30 – 2021-05-04 (×16): 10 mg via INTRAVENOUS
  Filled 2021-04-30 (×17): qty 1

## 2021-04-30 MED ORDER — PROSOURCE TF PO LIQD
45.0000 mL | Freq: Three times a day (TID) | ORAL | Status: DC
  Administered 2021-04-30 – 2021-05-12 (×33): 45 mL
  Filled 2021-04-30 (×32): qty 45

## 2021-04-30 MED ORDER — LACTATED RINGERS IV BOLUS
1000.0000 mL | Freq: Once | INTRAVENOUS | Status: AC
  Administered 2021-04-30: 1000 mL via INTRAVENOUS

## 2021-04-30 MED ORDER — DEXAMETHASONE SODIUM PHOSPHATE 4 MG/ML IJ SOLN
4.0000 mg | Freq: Four times a day (QID) | INTRAMUSCULAR | Status: DC
  Administered 2021-04-30: 4 mg via INTRAVENOUS
  Filled 2021-04-30: qty 1

## 2021-04-30 MED ORDER — DEXTROSE 5 % IV SOLN
1000.0000 mg | Freq: Three times a day (TID) | INTRAVENOUS | Status: DC
  Administered 2021-04-30: 1000 mg via INTRAVENOUS
  Filled 2021-04-30 (×7): qty 20

## 2021-04-30 MED ORDER — DEXMEDETOMIDINE HCL IN NACL 400 MCG/100ML IV SOLN
0.4000 ug/kg/h | INTRAVENOUS | Status: DC
  Administered 2021-04-30: 0.4 ug/kg/h via INTRAVENOUS
  Filled 2021-04-30: qty 100

## 2021-04-30 MED ORDER — NOREPINEPHRINE 4 MG/250ML-% IV SOLN
INTRAVENOUS | Status: AC
  Filled 2021-04-30: qty 250

## 2021-04-30 MED ORDER — DOCUSATE SODIUM 50 MG/5ML PO LIQD
100.0000 mg | Freq: Two times a day (BID) | ORAL | Status: DC | PRN
  Administered 2021-05-02 – 2021-05-06 (×4): 100 mg
  Filled 2021-04-30 (×4): qty 10

## 2021-04-30 NOTE — Progress Notes (Signed)
Spoke to Dr. Tacy Learn regarding SBPs in 70s, 1L LR bolus ordered.

## 2021-04-30 NOTE — Procedures (Signed)
Patient Name: Hayden Mcbride  MRN: [id number]  Epilepsy Attending: Lora Havens  Referring Physician/Provider: Dr Amie [place] Date: 04/30/2021 Duration: 24.13 mins  Patient history: 60 year old with sudden onset of altered mental status with a preceding headache. EEG to evaluate for seizure.  Level of alertness: comatose  AEDs during EEG study: None  Technical aspects: This EEG study was done with scalp electrodes positioned according to the 10-20 International system of electrode placement. Electrical activity was acquired at a sampling rate of 500Hz  and reviewed with a high frequency filter of 70Hz  and a low frequency filter of 1Hz . EEG data were recorded continuously and digitally stored.   Description: EEG showed continuous generalized and lateralized right hemisphere 3 to 6 Hz theta-delta slowing. Hyperventilation and photic stimulation were not performed.     ABNORMALITY - Continuous slow, generalized and lateralized right hemisphere  IMPRESSION: This study is suggestive of cortical dysfunction arising from right hemisphere region likely secondary to underlying structural abnormalitywithin normal limits. Additionally, there is moderate diffuse encephalopathy, non specific etiology. No seizures or epileptiform discharges were seen throughout the recording.  Hayden Mcbride [name]

## 2021-04-30 NOTE — Progress Notes (Signed)
Patient transported from ED15 to MRI.

## 2021-04-30 NOTE — Progress Notes (Signed)
Pharmacy Electrolyte Replacement  Recent Labs:  Recent Labs    04/30/21 0735  K 3.9  MG 1.6*  PHOS 3.3  CREATININE 1.13    Low Critical Values (K </= 2.5, Phos </= 1, Mg </= 1) Present: None  MD Contacted: n/a - no critical values noted  Plan: Mag sulfate 4g IV x 1 Recheck with AM labs per protocol   Arturo Morton, PharmD, BCPS Please check AMION for all [place] contact numbers Clinical Pharmacist 04/30/2021 9:58 AM

## 2021-04-30 NOTE — Progress Notes (Signed)
NAME:  Hayden Mcbride, MRN:  [id number], DOB:  1961-03-22, LOS: 0 ADMISSION DATE:  04/29/2021, CONSULTATION DATE:  04/30/21 REFERRING MD:  EDP, CHIEF COMPLAINT:  AMS   History of Present Illness:  Hayden Mcbride is a 60 year old male with past medical history of hypertension, GERD, colonic polyps, thyroid disease and OSA who presented to the emergency department speaking.  Last known well was around 900 on 5/17.  He was noted to have altered mental status and was combative with staff with slurred speech on arrival.  He was intubated for airway protection and to obtain CT head.  History taken from notes as patient is intubated, per report he had had a headache for 1 day prior to arrival and had an unwitnessed fall.  Head CT and CTA was negative for acute findings.  Labs notable for white blood cell count of 18 K, potassium 3.2, glucose 129, bilirubin 2.6, UDS negative except for benzos but was given Versed, lactic acid 3.1.  Blood cultures were drawn and he was started on broad-spectrum antibiotics.  LP pending for evaluation of possible meningitis.  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  He is outside here quite a bit, but no noted tick bites or mosquito bites. No recent rashes or fevers.  Pt was in his usual state of health other than has had some R ear pain (cholesteotoma removed some time ago).  On 5/17 he c/o headache and seemed fatigued then became altered.  Pertinent  Medical History   has a past medical history of Allergy, Dry eyes, GERD (gastroesophageal reflux disease), Glaucoma, adenomatous colonic polyps (07/08/2006), Hypertension, Post-operative nausea and vomiting, Sleep apnea, and Thyroid disease.   Significant Hospital Events: Including procedures, antibiotic start and stop dates in addition to other pertinent events   . 5/17 presented to ED, intubated  for agitation and airway protection . 5/18 PCCM consulted for admission, lumbar puncture completed   Tubes/lines ETT 5/17-  Antibiotics: Ampicillin 5/17- Ceftriaxone 5/17- Vancomycin 5/17-   Micro: 5/18 CSF culture and gram stain>> 5/18 CSF fungal cx>>     Interim History / Subjective:  Pt hemodynamically stable, intubated and sedated  Objective   Blood pressure 94/69, pulse 60, temperature 97.7 F (36.5 C), temperature source Axillary, resp. rate 16, height 5\' 11"  (1.803 m), weight 116.8 kg, SpO2 98 %.    Vent Mode: PRVC FiO2 (%):  [40 %-100 %] 40 % Set Rate:  [16 bmp] 16 bmp Vt Set:  [600 mL] 600 mL PEEP:  [5 cmH20] 5 cmH20 Plateau Pressure:  [16 cmH20] 16 cmH20   Intake/Output Summary (Last 24 hours) at 04/30/2021 0951 Last data filed at 04/30/2021 0800 Gross per 24 hour  Intake 1601.51 ml  Output 1050 ml  Net 551.51 ml   Filed Weights   04/29/21 2317  Weight: 116.8 kg    General:  Overweight M, intubated and sedated HEENT: MM pink/moist, sclera slightly injected with pupils equal and reactive Neuro: intubated and on Fentanyl gtt, PERRLA,  CV: s1s2 rrr, no m/r/g PULM:  Mechanical breath sounds bilaterally without wheezing or rhonchi, on full vent support GI: soft, bsx4 active  Extremities: warm/dry, no edema  Skin: no rashes or lesions   Labs/imaging that I havepersonally reviewed  (right click and "Reselect all SmartList Selections" daily)  WBC 18.9 ABG 7.34/43.4/342/23.8 Lactic acid 3.1 CXR- ET tube in the trachea  MRI rules out stroke Spinal fluid shows low glucose, high total protein, high RBC, WBC, cloudy, yellow and xanthochromic fluid.   Resolved Hospital Problem list     Assessment & Plan:  AMS, history consistent with possible encephalitis -on ampicillin, ceftriaxone, vancomycin and acyclovir -WBC 18.9 -infectious disease consult  Bradycardia - stop precedex  Acute hypoxic respiratory failure -lung-protective ventilation -VAP  bundle -SAT/SBT as tolerated   Lactic acidosis - lactate 3.1 - on normal saline - trend   Electrolyte imbalance -hypocalcemia (corrected calcium 8.5) -hypomagnesemia (Mg 1.6) -trend CMP  Erythrocytosis, thrombocytopenia - Hb 17.4 - Platelet still low but improved 142> 145 - trend CBC  History of hypertension -Hold antihypertensive  Best practice (right click and "Reselect all SmartList Selections" daily)  Diet:  NPO Pain/Anxiety/Delirium protocol (if indicated): Yes (RASS goal +1) VAP protocol (if indicated): Yes DVT prophylaxis: Contraindicated LP GI prophylaxis: PPI Glucose control:  SSI Yes Central venous access:  N/A Arterial line:  N/A Foley:  Yes, and it is still needed Mobility:  bed rest  PT consulted: N/A Last date of multidisciplinary goals of care discussion [pending] Code Status:  full code Disposition: ICU  Labs   CBC: Recent Labs  Lab 04/29/21 2234 04/29/21 2240 04/29/21 2358 04/30/21 0735  WBC 18.3*  --   --  18.9*  NEUTROABS 16.9*  --   --   --   HGB 18.7* 18.7* 18.4* 17.4*  HCT 55.4* 55.0* 54.0* 51.5  MCV 94.4  --   --  93.5  PLT 142*  --   --  145*    Basic Metabolic Panel: Recent Labs  Lab 04/29/21 2234 04/29/21 2240 04/29/21 2358 04/30/21 0735  NA 139 142 141 138  K 3.2* 3.2* 2.8* 3.9  CL 105 104  --  106  CO2 21*  --   --  23  GLUCOSE 129* 125*  --  127*  BUN 13 17  --  18  CREATININE 1.05 0.80  --  1.13  CALCIUM 9.0  --   --  8.5*  MG  --   --   --  1.6*  PHOS  --   --   --  3.3   GFR: Estimated Creatinine Clearance: 89.2 mL/min (by C-G formula based on SCr of 1.13 mg/dL). Recent Labs  Lab 04/29/21 2234 04/29/21 2354 04/30/21 0735  WBC 18.3*  --  18.9*  LATICACIDVEN  --  3.1*  --     Liver Function Tests: Recent Labs  Lab 04/29/21 2234  AST 32  ALT 27  ALKPHOS 50  BILITOT 2.6*  PROT 7.0  ALBUMIN 4.0   No results for input(s): LIPASE, AMYLASE in the last 168 hours. No results for input(s): AMMONIA in  the last 168 hours.  ABG    Component Value Date/Time   PHART 7.345 (L) 04/29/2021 2358   PCO2ART 43.4 04/29/2021 2358   PO2ART 342 (H) 04/29/2021 2358   HCO3 23.8 04/29/2021 2358   TCO2 25 04/29/2021 2358   ACIDBASEDEF 2.0 04/29/2021 2358   O2SAT 100.0 04/29/2021 2358     Coagulation Profile: Recent Labs  Lab 04/30/21 0027  INR 1.4*    Cardiac Enzymes: No results for input(s): CKTOTAL, CKMB, CKMBINDEX, TROPONINI in the last 168 hours.  HbA1C: Hgb A1c MFr Bld  Date/Time Value Ref Range Status  04/30/2021 07:39 AM 5.4 4.8 - 5.6 % Final    Comment:    (NOTE) Pre diabetes:          5.7%-6.4%  Diabetes:              >6.4%  Glycemic control for   <7.0% adults with diabetes     CBG: Recent Labs  Lab 04/29/21 2233 04/30/21 0501 04/30/21 0749  GLUCAP 152* 153* 137*    Review of Systems:   Unable to obtain secondary to mental status  Past Medical History:  He,  has a past medical history of Allergy, Dry eyes, GERD (gastroesophageal reflux disease), Glaucoma, adenomatous colonic polyps (07/08/2006), Hypertension, Post-operative nausea and vomiting, Sleep apnea, and Thyroid disease.   Surgical History:   Past Surgical History:  Procedure Laterality Date  . BREATH TEK H PYLORI N/A 12/24/2014   Procedure: BREATH TEK H PYLORI;  Surgeon: Alphonsa Overall, MD;  Location: [facility];  Service: General;  Laterality: N/A;  . CHOLECYSTECTOMY    . COLONOSCOPY    . EYE SURGERY     age 5   . 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 POLYPECTOMY    . SHOULDER SURGERY     left shoulder  . THYROID SURGERY       Social History:   reports that he quit smoking about 36 years ago. His smoking use included cigarettes. He quit after 10.00 years of use. He has never used smokeless tobacco. He reports current alcohol use of about 2.0 standard drinks of alcohol per week. He reports that he does not use drugs.   Family History:  His family history includes Cancer in his  maternal grandmother; Colon cancer in his father; Diverticulitis in his mother. There is no history of Esophageal cancer, Stomach cancer, Rectal cancer, or Colon polyps.   Allergies Allergies  Allergen Reactions  . Demerol [Meperidine]     Large doses cause nausea and vomiting     Home Medications  Prior to Admission medications   Medication Sig Start Date End Date Taking? Authorizing Provider  amLODipine (NORVASC) 5 MG tablet Take 5 mg by mouth daily. 04/18/21  Yes [provider]  ibuprofen (ADVIL) 200 MG tablet Take 200-400 mg by mouth every 6 (six) hours as needed for headache or moderate pain.   Yes [provider]  losartan (COZAAR) 100 MG tablet Take 100 mg by mouth daily.   Yes [provider]  Multiple Vitamins-Minerals (ONE-A-DAY MENS 50+) TABS Take 1 tablet by mouth daily.   Yes [provider]  omeprazole (PRILOSEC) 20 MG capsule Take 20 mg by mouth daily.    Yes [provider]

## 2021-04-30 NOTE — Progress Notes (Signed)
[place] Progress Note Patient Name: Hayden Mcbride DOB: 04-Nov-1961 MRN: [id number]   Date of Service  04/30/2021  HPI/Events of Note  Per bedside RN lab called to report that patient has gram + cocci in the blood, he is broadly covered, including Vancomycin.  eICU Interventions  Maintain current antibiotic coverage pending final results.        Kerry Kass Libbi Towner 04/30/2021, 4:17 AM

## 2021-04-30 NOTE — Progress Notes (Signed)
Patient transported from MRI to 4N22 without incidence.

## 2021-04-30 NOTE — Progress Notes (Signed)
Date and time results received: 04/30/2021 @ 0342  Test: CSF   Critical Value: Gram positive cocci   Name of Provider Notified: Lynnae Prude RN   Orders Received? Or Actions Taken?: No current new orders.

## 2021-04-30 NOTE — Progress Notes (Signed)
Initial Nutrition Assessment  DOCUMENTATION CODES:   Not applicable  INTERVENTION:   Once OG advanced and KUB confirms placement   Start tube feeding:  -Vital AF 1.2 @ 65 ml/hr via OG (1560 ml) -ProSource TF 45 ml TID  Provides: 1992 kcals, 150 grams protein, 1265 ml free water.   NUTRITION DIAGNOSIS:   Increased nutrient needs related to acute illness as evidenced by estimated needs.  GOAL:   Patient will meet greater than or equal to 90% of their needs  MONITOR:   Vent status,Skin,TF tolerance,Weight trends,Labs,I & O's  REASON FOR ASSESSMENT:   Ventilator    ASSESSMENT:   Patient with PMH significant for HTN, GERD, colonic polyps, thyroid disease, and OSA. Presents this admission with bacterial meningitis.   Mental status precludes extubation. Xray shows OG side port located at GE junction. Discussed with RN, plan to advance and obtain KUB. Once confirmed in correct position, okay to start TF per CCM.   Records indicate patient has maintained his weight over the last 10 months. Utilize 116.8 kg as EDW for now.   Patient is currently intubated on ventilator support MV: 9.7 L/min Temp (24hrs), Avg:97.7 F (36.5 C), Min:97.2 F (36.2 C), Max:98.2 F (36.8 C)  UOP: 800 ml since admit   Drips: magnesium sulfate, levophed  Medications: decadron, SS novolog Labs: Mg 1.6 (L)  NUTRITION - FOCUSED PHYSICAL EXAM:  Flowsheet Row Most Recent Value  Orbital Region No depletion  Upper Arm Region No depletion  Thoracic and Lumbar Region Unable to assess  Buccal Region No depletion  Temple Region No depletion  Clavicle Bone Region Mild depletion  Clavicle and Acromion Bone Region Mild depletion  Scapular Bone Region Unable to assess  Dorsal Hand Unable to assess  [mits]  Patellar Region Mild depletion  Anterior Thigh Region Mild depletion  Posterior Calf Region Mild depletion  Edema (RD Assessment) Mild  Hair Reviewed  Eyes Unable to assess  Mouth Unable to  assess  Skin Reviewed  Nails Unable to assess     Diet Order:   Diet Order            Diet NPO time specified  Diet effective now                 EDUCATION NEEDS:   Not appropriate for education at this time  Skin:  Skin Assessment: Reviewed RN Assessment  Last BM:  PTA  Height:   Ht Readings from Last 1 Encounters:  04/29/21 5\' 11"  (1.803 m)    Weight:   Wt Readings from Last 1 Encounters:  04/29/21 116.8 kg    BMI:  Body mass index is 35.91 kg/m.  Estimated Nutritional Needs:   Kcal:  1900-2100 kcal  Protein:  150-185 grams  Fluid:  >/= 1.8 L/day  Mariana Single RD, LDN Clinical Nutrition Pager listed in [place]

## 2021-04-30 NOTE — Progress Notes (Signed)
Neurology Progress Note   S:// Seen and examined CSF results consistent with bacterial meningitis, CSF culture growing gram-positive cocci with predominantly mononuclear WBCs. MRI brain also reviewed personally-consistent with meningitis.  O:// Current vital signs: BP 94/69   Pulse 60   Temp 97.7 F (36.5 C) (Axillary)   Resp 16   Ht 5' 11" (1.803 m)   Wt 116.8 kg   SpO2 98%   BMI 35.91 kg/m  Vital signs in last 24 hours: Temp:  [97.2 F (36.2 C)-98.2 F (36.8 C)] 97.7 F (36.5 C) (05/18 0800) Pulse Rate:  [56-98] 60 (05/18 0800) Resp:  [13-41] 16 (05/18 0800) BP: (92-218)/(56-158) 94/69 (05/18 0800) SpO2:  [95 %-100 %] 98 % (05/18 0800) FiO2 (%):  [40 %-100 %] 40 % (05/18 0736) Weight:  [116.8 kg] 116.8 kg (05/17 2317) General: Sedated intubated HEENT: Normocephalic atraumatic, moderate degree of neck stiffness while trying to flex his neck, much more supple on cytocide movement. CVS: Regular rate rhythm Abdomen nondistended nontender Respiratory: Vented Neurological exam Opens eyes to voice, attempts to track the examiner albeit very weakly. Question that he followed commands in the lower extremities by beginning toes but did not follow commands in upper extremities. Nonverbal Cranial nerves: Disconjugate gaze, equal pupils bilaterally, unable to properly assess extraocular movements, facial symmetry also difficult to ascertain due to ET tube. Motor examination: Mild grimacing on noxious stimulation to both upper extremities.  Able to wiggle toes to command questionably. Sensory exam: As above Coordination cannot be assessed   Medications  Current Facility-Administered Medications:  .  0.9 %  sodium chloride infusion, , Intravenous, PRN, Kipp Brood, MD, Last Rate: 10 mL/hr at 04/30/21 0800, Infusion Verify at 04/30/21 0800 .  acyclovir (ZOVIRAX) 1,000 mg in dextrose 5 % 150 mL IVPB, 1,000 mg, Intravenous, Q8H, Laren Everts, RPH, Stopped at 04/30/21 0604 .   ampicillin (OMNIPEN) 2 g in sodium chloride 0.9 % 100 mL IVPB, 2 g, Intravenous, Q4H, Gleason, Otilio Carpen, PA-C, Stopped at 04/30/21 0755 .  cefTRIAXone (ROCEPHIN) 2 g in sodium chloride 0.9 % 100 mL IVPB, 2 g, Intravenous, Q12H, Gleason, Otilio Carpen, PA-C, Stopped at 04/30/21 0033 .  chlorhexidine gluconate (MEDLINE KIT) (PERIDEX) 0.12 % solution 15 mL, 15 mL, Mouth Rinse, BID, Agarwala, Ravi, MD, 15 mL at 04/30/21 0505 .  Chlorhexidine Gluconate Cloth 2 % PADS 6 each, 6 each, Topical, Daily, Kipp Brood, MD, 6 each at 04/30/21 0446 .  dexamethasone (DECADRON) injection 4 mg, 4 mg, Intravenous, Q6H, Amie Portland, MD .  dexmedetomidine (PRECEDEX) 400 MCG/100ML (4 mcg/mL) infusion, 0.4-1.2 mcg/kg/hr, Intravenous, Titrated, Ogan, Okoronkwo U, MD, Last Rate: 11.68 mL/hr at 04/30/21 0800, 0.4 mcg/kg/hr at 04/30/21 0800 .  docusate sodium (COLACE) capsule 100 mg, 100 mg, Oral, BID PRN, Gleason, Otilio Carpen, PA-C .  fentaNYL (SUBLIMAZE) bolus via infusion 50-100 mcg, 50-100 mcg, Intravenous, Q15 min PRN, Gleason, Otilio Carpen, PA-C, 50 mcg at 04/30/21 0039 .  fentaNYL (SUBLIMAZE) injection 50 mcg, 50 mcg, Intravenous, Once, Gleason, Otilio Carpen, PA-C .  fentaNYL 2561mg in NS 2550m(1066mml) infusion-PREMIX, 50-200 mcg/hr, Intravenous, Continuous, Gleason, LauOtilio CarpenA-C, Last Rate: 17.5 mL/hr at 04/30/21 0800, 175 mcg/hr at 04/30/21 0800 .  insulin aspart (novoLOG) injection 0-15 Units, 0-15 Units, Subcutaneous, Q4H, Gleason, LauOtilio CarpenA-C, 2 Units at 04/30/21 [id number] lactated ringers infusion, , Intravenous, Continuous, Chand, Sudham, MD .  MEDLINE mouth rinse, 15 mL, Mouth Rinse, 10 times per day, AgaKipp BroodD, 15 mL at 04/30/21 0741 .  mupirocin ointment (BACTROBAN) 2 % 1 application, 1 application, Nasal, BID, Agarwala, Ravi, MD .  pantoprazole (PROTONIX) injection 40 mg, 40 mg, Intravenous, QHS, Gleason, Otilio Carpen, PA-C, 40 mg at 04/30/21 0253 .  polyethylene glycol (MIRALAX / GLYCOLAX) packet 17 g, 17 g, Oral,  Daily PRN, Gleason, Otilio Carpen, PA-C .  vancomycin (VANCOREADY) IVPB 1000 mg/200 mL, 1,000 mg, Intravenous, Q12H, Gleason, Sissy Hoff Labs CBC    Component Value Date/Time   WBC 18.9 (H) 04/30/2021 0735   RBC 5.51 04/30/2021 0735   HGB 17.4 (H) 04/30/2021 0735   HCT 51.5 04/30/2021 0735   PLT 145 (L) 04/30/2021 0735   MCV 93.5 04/30/2021 0735   MCH 31.6 04/30/2021 0735   MCHC 33.8 04/30/2021 0735   RDW 13.0 04/30/2021 0735   LYMPHSABS 0.7 04/29/2021 2234   MONOABS 0.5 04/29/2021 2234   EOSABS 0.1 04/29/2021 2234   BASOSABS 0.0 04/29/2021 2234    CMP     Component Value Date/Time   NA 138 04/30/2021 0735   K 3.9 04/30/2021 0735   CL 106 04/30/2021 0735   CO2 23 04/30/2021 0735   GLUCOSE 127 (H) 04/30/2021 0735   BUN 18 04/30/2021 0735   CREATININE 1.13 04/30/2021 0735   CALCIUM 8.5 (L) 04/30/2021 0735   PROT 7.0 04/29/2021 2234   ALBUMIN 4.0 04/29/2021 2234   AST 32 04/29/2021 2234   ALT 27 04/29/2021 2234   ALKPHOS 50 04/29/2021 2234   BILITOT 2.6 (H) 04/29/2021 2234   GFRNONAA >60 04/30/2021 0735   CSF analysis CSF tube 1 RBC 91, WBC 585 with 99% segmented neutrophils, yellow CSF, xanthochromic.  Tube 3 RBC count 110, WBC 510, 99% segmented neutrophils. CSF protein greater than 600 CSF glucose less than 20 General CSF appearance: Cloudy  Other infectious disease results: Crypto antigen negative COVID-19 negative  Imaging I have reviewed images in epic and the results pertinent to this consultation are: MRI of the brain with leptomeningeal enhancement.  Assessment:  60 year old with sudden onset of altered mental status with a preceding headache, with CSF and MRI findings suggestive of bacterial meningitis currently on empiric antibiotic coverage.  Impression: Bacterial meningitis  Recommendations: Broad-spectrum antibiotic and steroid coverage for meningitis Ventilatory support per PCCM ID consultation for follow-up Check HIV status Routine  EEG  Discussed with Dr. Tacy Learn and H. Von Dohlen, Rph on the unit.  -- Amie Portland, MD Neurologist [facility] Pager: [phone]  CRITICAL CARE ATTESTATION Performed by: Amie Portland, MD Total critical care time: 31 minutes Critical care time was exclusive of separately billable procedures and treating other patients and/or supervising APPs/Residents/Students Critical care was necessary to treat or prevent imminent or life-threatening deterioration due to bacterial meningitis. This patient is critically ill and at significant risk for neurological worsening and/or death and care requires constant monitoring. Critical care was time spent personally by me on the following activities: development of treatment plan with patient and/or surrogate as well as nursing, discussions with consultants, evaluation of patient's response to treatment, examination of patient, obtaining history from patient or surrogate, ordering and performing treatments and interventions, ordering and review of laboratory studies, ordering and review of radiographic studies, pulse oximetry, re-evaluation of patient's condition, participation in multidisciplinary rounds and medical decision making of high complexity in the care of this patient.

## 2021-04-30 NOTE — Procedures (Signed)
Lumbar Puncture Procedure Note  Hayden Mcbride  [id number]  1961/08/01  Date:04/30/21  Time:2:58 AM   Provider Performing:Julianna Vanwagner   Procedure: Lumbar Puncture (55208)  Indication(s) Rule out meningitis  Consent Risks of the procedure as well as the alternatives and risks of each were explained to the patient and/or caregiver.  Consent for the procedure was obtained and is signed in the bedside chart  Anesthesia Patient sedated and given neuromuscular blockade while on mechanical ventilation.  Time Out Verified patient identification, verified procedure, site/side was marked, verified correct patient position, special equipment/implants available, medications/allergies/relevant history reviewed, required imaging and test results available.  Sterile Technique Maximal sterile technique including sterile barrier drape, hand hygiene, sterile gown, sterile gloves, mask, hair covering.  Procedure Description Using palpation, approximate location of L3-L4 space identified.   Lidocaine used to anesthetize skin and subcutaneous tissue overlying this area.  A 20g spinal needle was then used to access the subarachnoid space. Opening pressure:40cm H2O. Closing pressure:21cm H2O.  16 mL of xanthochromic CSF obtained.  Complications/Tolerance None; patient tolerated the procedure well.   EBL Minimal   Specimen(s) CSF    Kipp Brood, MD [facility] ICU Physician [place]  Pager: [phone] Or Epic Secure Chat After hours: [phone].  04/30/2021, 3:02 AM

## 2021-04-30 NOTE — Progress Notes (Signed)
Pharmacy Antimicrobial Note  Hayden Mcbride is a 60 y.o. male admitted on 04/29/2021 with encephalitis.  Pharmacy has been consulted for acyclovir dosing.  Pt already started on vanc, amp, and CTX.  Plan: Acyclovir 1000mg  IV Q8H. Maintenance fluid at 125 ml/hr for renal protection.  Height: 5\' 11"  (180.3 cm) Weight: 116.8 kg (257 lb 8 oz) IBW/kg (Calculated) : 75.3  Temp (24hrs), Avg:98.2 F (36.8 C), Min:98.2 F (36.8 C), Max:98.2 F (36.8 C)  Recent Labs  Lab 04/29/21 2234 04/29/21 2240 04/29/21 2354  WBC 18.3*  --   --   CREATININE 1.05 0.80  --   LATICACIDVEN  --   --  3.1*    Estimated Creatinine Clearance: 126 mL/min (by C-G formula based on SCr of 0.8 mg/dL).    Allergies  Allergen Reactions  . Demerol [Meperidine]     Large doses cause nausea and vomiting     Thank you for allowing pharmacy to be a part of this patient's care.  Wynona Neat, PharmD, BCPS  04/30/2021 2:39 AM

## 2021-04-30 NOTE — Progress Notes (Signed)
EEG complete - results pending 

## 2021-04-30 NOTE — Progress Notes (Signed)
SBP continues to be low after LR bolus.  Last pressure 68/57 (62) at 1145.  Pt also bradycardic in mid 40s to low 50s.  Notified Dr. Tacy Learn and Levophed infusion ordered.

## 2021-04-30 NOTE — Progress Notes (Signed)
[place] Progress Note Patient Name: Hayden Mcbride DOB: [birth year] MRN: [id number]   Date of Service  04/30/2021  HPI/Events of Note  Patient with severe agitated delirium secondary to encephalopathy associated with meningitis / encephalitis.  eICU Interventions  Precedex infusion ordered for better sedation, bilateral wrist and ankle restraints ordered for patient safety.        Kerry Kass Jagdeep Ancheta 04/30/2021, 7:04 AM

## 2021-04-30 NOTE — Consult Note (Addendum)
[facility]  Total days of antibiotics 2/vanco-ctx-amp               Reason for Consult: meningitis    Referring Physician: chand  Active Problems:   AMS (altered mental status)    HPI: Hayden Mcbride is a 59 y.o. male  With hx of HTN, GERD, who had acute onset of altered mental status with agitation and headache. He was taken to the ED emergently for evaluation. He was intubated for airway protection. He had returned 1  Day prior from a 2 week trip to [place], per chart review. He underwent NCHCT which was unremarkable. His labs showed: 18K, with 91%N, LA 3.1, no transaminitis, t.bili 2.6. he underwent LP with opening pressure of 40, xanthochromic in color.  CSF labs significant for neutrophilic predominant pleocytosis -WBC of 585/ 99% neutrophils/Protein >600/Glu <20, gram stain showed GPC. He was empirically started on steroids, vanco/ceftriaxone/amp/plus acyclovir. Brain MRI showed diffuse leptomeningeal enhancement but also punctate DWI foci c/w trace purulence in the midline parieto-occipital sulcus. No infarction.  He remains intubated. Had episode of hypotension -thus precedex stopped to see if improved. He is opens eyes to verbal stimuli but not following commands  Past Medical History:  Diagnosis Date  . Allergy   . Dry eyes   . GERD (gastroesophageal reflux disease)   . Glaucoma   . Hx of adenomatous colonic polyps 07/08/2006   06/2006 - diminutive adenoma 04/19/2015 - diminutive adenoma and one polyp lost - repeat colonoscopy 2021  . Hypertension   . Post-operative nausea and vomiting   . Sleep apnea    wears CPAP  . Thyroid disease    long ago- half thyroid removed ~20 yrs ago     Allergies:  Allergies  Allergen Reactions  . Demerol [Meperidine]     Large doses cause nausea and vomiting     MEDICATIONS: . chlorhexidine gluconate (MEDLINE KIT)  15 mL Mouth Rinse BID  . Chlorhexidine Gluconate Cloth  6 each Topical Daily  . dexamethasone  (DECADRON) injection  10 mg Intravenous Q6H  . fentaNYL (SUBLIMAZE) injection  50 mcg Intravenous Once  . insulin aspart  0-15 Units Subcutaneous Q4H  . mouth rinse  15 mL Mouth Rinse 10 times per day  . mupirocin ointment  1 application Nasal BID  . pantoprazole sodium  40 mg Per Tube Daily    Social History   Tobacco Use  . Smoking status: Former Smoker    Years: 10.00    Types: Cigarettes    Quit date: 02/26/1985    Years since quitting: 36.1  . Smokeless tobacco: Never Used  Vaping Use  . Vaping Use: Never used  Substance Use Topics  . Alcohol use: Yes    Alcohol/week: 2.0 standard drinks    Types: 2 Cans of beer per week  . Drug use: No    Family History  Problem Relation Age of Onset  . Diverticulitis Mother   . Colon cancer Father        in his 69's dx'd   . Cancer Maternal Grandmother   . Esophageal cancer Neg Hx   . Stomach cancer Neg Hx   . Rectal cancer Neg Hx   . Colon polyps Neg Hx     Review of Systems - unable to obtain since patient intubated and sedated   OBJECTIVE: Temp:  [97.2 F (36.2 C)-98.2 F (36.8 C)] 97.7 F (36.5 C) (05/18 0800) Pulse Rate:  [48-98] 49 (  05/18 1200) Resp:  [13-41] 17 (05/18 1200) BP: (68-218)/(56-158) 96/74 (05/18 1200) SpO2:  [95 %-100 %] 100 % (05/18 1200) FiO2 (%):  [40 %-100 %] 40 % (05/18 1117) Weight:  [116.8 kg] 116.8 kg (05/17 2317) Physical Exam  Constitutional: He is intubated/sedated. He appears well-developed and well-nourished. No distress.  HENT: opens eyes equally to verbal stimuli. PERRLA Mouth/Throat: OETT in place Cardiovascular: Normal rate, regular rhythm and normal heart sounds. Exam reveals no gallop and no friction rub.  No murmur heard.  Pulmonary/Chest: Effort normal and breath sounds normal. No respiratory distress. He has no wheezes.  Abdominal: Soft. Bowel sounds are normal. He exhibits no distension. There is no tenderness.  Neurological: deferred since sedated. Opens eyes equally. Not  following commands Skin: Skin is warm and dry. No rash noted. No erythema.    LABS: Results for orders placed or performed during the hospital encounter of 04/29/21 (from the past 48 hour(s))  CBG monitoring, ED     Status: Abnormal   Collection Time: 04/29/21 10:33 PM  Result Value Ref Range   Glucose-Capillary 152 (H) 70 - 99 mg/dL    Comment: Glucose reference range applies only to samples taken after fasting for at least 8 hours.  CBC     Status: Abnormal   Collection Time: 04/29/21 10:34 PM  Result Value Ref Range   WBC 18.3 (H) 4.0 - 10.5 K/uL   RBC 5.87 (H) 4.22 - 5.81 MIL/uL   Hemoglobin 18.7 (H) 13.0 - 17.0 g/dL   HCT 55.4 (H) 39.0 - 52.0 %   MCV 94.4 80.0 - 100.0 fL   MCH 31.9 26.0 - 34.0 pg   MCHC 33.8 30.0 - 36.0 g/dL   RDW 13.0 11.5 - 15.5 %   Platelets 142 (L) 150 - 400 K/uL   nRBC 0.0 0.0 - 0.2 %    Comment: Performed at [facility] [address], [place], [place] [address]  Differential     Status: Abnormal   Collection Time: 04/29/21 10:34 PM  Result Value Ref Range   Neutrophils Relative % 91 %   Neutro Abs 16.9 (H) 1.7 - 7.7 K/uL   Lymphocytes Relative 4 %   Lymphs Abs 0.7 0.7 - 4.0 K/uL   Monocytes Relative 3 %   Monocytes Absolute 0.5 0.1 - 1.0 K/uL   Eosinophils Relative 1 %   Eosinophils Absolute 0.1 0.0 - 0.5 K/uL   Basophils Relative 0 %   Basophils Absolute 0.0 0.0 - 0.1 K/uL   Immature Granulocytes 1 %   Abs Immature Granulocytes 0.12 (H) 0.00 - 0.07 K/uL    Comment: Performed at [facility] [address], [place], [place] [address]  Comprehensive metabolic panel     Status: Abnormal   Collection Time: 04/29/21 10:34 PM  Result Value Ref Range   Sodium 139 135 - 145 mmol/L   Potassium 3.2 (L) 3.5 - 5.1 mmol/L   Chloride 105 98 - 111 mmol/L   CO2 21 (L) 22 - 32 mmol/L   Glucose, Bld 129 (H) 70 - 99 mg/dL    Comment: Glucose reference range applies only to samples taken after fasting for at least 8 hours.   BUN 13 8 - 23  mg/dL   Creatinine, Ser 1.05 0.61 - 1.24 mg/dL   Calcium 9.0 8.9 - 10.3 mg/dL   Total Protein 7.0 6.5 - 8.1 g/dL   Albumin 4.0 3.5 - 5.0 g/dL   AST 32 15 - 41  U/L   ALT 27 0 - 44 U/L   Alkaline Phosphatase 50 38 - 126 U/L   Total Bilirubin 2.6 (H) 0.3 - 1.2 mg/dL   GFR, Estimated >60 >60 mL/min    Comment: (NOTE) Calculated using the CKD-EPI Creatinine Equation (2021)    Anion gap 13 5 - 15    Comment: Performed at [facility] [address], [place], [place] [address]  I-stat chem 8, ED     Status: Abnormal   Collection Time: 04/29/21 10:40 PM  Result Value Ref Range   Sodium 142 135 - 145 mmol/L   Potassium 3.2 (L) 3.5 - 5.1 mmol/L   Chloride 104 98 - 111 mmol/L   BUN 17 8 - 23 mg/dL   Creatinine, Ser 0.80 0.61 - 1.24 mg/dL   Glucose, Bld 125 (H) 70 - 99 mg/dL    Comment: Glucose reference range applies only to samples taken after fasting for at least 8 hours.   Calcium, Ion 1.08 (L) 1.15 - 1.40 mmol/L   TCO2 23 22 - 32 mmol/L   Hemoglobin 18.7 (H) 13.0 - 17.0 g/dL   HCT 55.0 (H) 39.0 - 52.0 %  SARS CORONAVIRUS 2 (TAT 6-24 HRS) Nasopharyngeal Nasopharyngeal Swab     Status: None   Collection Time: 04/29/21 10:57 PM   Specimen: Nasopharyngeal Swab  Result Value Ref Range   SARS Coronavirus 2 NEGATIVE NEGATIVE    Comment: (NOTE) SARS-CoV-2 target nucleic acids are NOT DETECTED.  The SARS-CoV-2 RNA is generally detectable in upper and lower respiratory specimens during the acute phase of infection. Negative results do not preclude SARS-CoV-2 infection, do not rule out co-infections with other pathogens, and should not be used as the sole basis for treatment or other patient management decisions. Negative results must be combined with clinical observations, patient history, and epidemiological information. The expected result is Negative.  Fact Sheet for Patients: SugarRoll.be  Fact Sheet for Healthcare  Providers: https://www.woods-mathews.com/  This test is not yet approved or cleared by the [place] FDA and  has been authorized for detection and/or diagnosis of SARS-CoV-2 by FDA under an Emergency Use Authorization (EUA). This EUA will remain  in effect (meaning this test can be used) for the duration of the COVID-19 declaration under Se ction 564(b)(1) of the Act, 21 U.S.C. section 360bbb-3(b)(1), unless the authorization is terminated or revoked sooner.  Performed at [facility], [place] [address], [place], [place] [address]   CSF culture     Status: None (Preliminary result)   Collection Time: 04/29/21 11:29 PM   Specimen: CSF; Cerebrospinal Fluid  Result Value Ref Range   Specimen Description CSF    Special Requests Normal    Gram Stain      GRAM POSITIVE COCCI WBC PRESENT, PREDOMINANTLY MONONUCLEAR CRITICAL RESULT CALLED TO, READ BACK BY AND VERIFIED WITH: RN MEGAN PLUMMER BY MESSAN H. AT 0345 ON 5 18 2022 CYTOSPIN SMEAR Performed at [facility], [place] [address], [place], [place] [address]    Culture PENDING    Report Status PENDING   Urinalysis, Routine w reflex microscopic Urine, Catheterized     Status: Abnormal   Collection Time: 04/29/21 11:42 PM  Result Value Ref Range   Color, Urine YELLOW YELLOW   APPearance CLEAR CLEAR   Specific Gravity, Urine 1.033 (H) 1.005 - 1.030   pH 6.0 5.0 - 8.0   Glucose, UA 50 (A) NEGATIVE mg/dL   Hgb urine dipstick SMALL (A) NEGATIVE  Bilirubin Urine NEGATIVE NEGATIVE   Ketones, ur 5 (A) NEGATIVE mg/dL   Protein, ur NEGATIVE NEGATIVE mg/dL   Nitrite NEGATIVE NEGATIVE   Leukocytes,Ua NEGATIVE NEGATIVE   RBC / HPF 0-5 0 - 5 RBC/hpf   WBC, UA 6-10 0 - 5 WBC/hpf   Bacteria, UA NONE SEEN NONE SEEN   Squamous Epithelial / LPF 0-5 0 - 5   Mucus PRESENT     Comment: Performed at [facility], [place] [address], [place], [place] [address]  Urine rapid drug screen (hosp performed)     Status: Abnormal    Collection Time: 04/29/21 11:42 PM  Result Value Ref Range   Opiates NONE DETECTED NONE DETECTED   Cocaine NONE DETECTED NONE DETECTED   Benzodiazepines POSITIVE (A) NONE DETECTED   Amphetamines NONE DETECTED NONE DETECTED   Tetrahydrocannabinol NONE DETECTED NONE DETECTED   Barbiturates NONE DETECTED NONE DETECTED    Comment: (NOTE) DRUG SCREEN FOR MEDICAL PURPOSES ONLY.  IF CONFIRMATION IS NEEDED FOR ANY PURPOSE, NOTIFY LAB WITHIN 5 DAYS.  LOWEST DETECTABLE LIMITS FOR URINE DRUG SCREEN Drug Class                     Cutoff (ng/mL) Amphetamine and metabolites    1000 Barbiturate and metabolites    200 Benzodiazepine                 280 Tricyclics and metabolites     300 Opiates and metabolites        300 Cocaine and metabolites        300 THC                            50 Performed at [facility], [place] [address], [place], [place] [address]   Lactic acid, plasma     Status: Abnormal   Collection Time: 04/29/21 11:54 PM  Result Value Ref Range   Lactic Acid, Venous 3.1 (HH) 0.5 - 1.9 mmol/L    Comment: CRITICAL RESULT CALLED TO, READ BACK BY AND VERIFIED WITH: A. Dow Adolph, [place] 04/29/21 A. MCDOWELL Performed at [facility], [place] [address], [place], [place] [address]   I-Stat arterial blood gas, ED     Status: Abnormal   Collection Time: 04/29/21 11:58 PM  Result Value Ref Range   pH, Arterial 7.345 (L) 7.350 - 7.450   pCO2 arterial 43.4 32.0 - 48.0 mmHg   pO2, Arterial 342 (H) 83.0 - 108.0 mmHg   Bicarbonate 23.8 20.0 - 28.0 mmol/L   TCO2 25 22 - 32 mmol/L   O2 Saturation 100.0 %   Acid-base deficit 2.0 0.0 - 2.0 mmol/L   Sodium 141 135 - 145 mmol/L   Potassium 2.8 (L) 3.5 - 5.1 mmol/L   Calcium, Ion 1.22 1.15 - 1.40 mmol/L   HCT 54.0 (H) 39.0 - 52.0 %   Hemoglobin 18.4 (H) 13.0 - 17.0 g/dL   Patient temperature 98.2 F    Collection site Radial    Drawn by RT    Sample type ARTERIAL   Protime-INR     Status: Abnormal   Collection Time: 04/30/21  12:27 AM  Result Value Ref Range   Prothrombin Time 17.2 (H) 11.4 - 15.2 seconds    Comment: SPECIMEN COLLECTED IN ANTICOAGULANT ADJUSTED TUBE DUE TO ELEVATED HEMATOCRIT   INR 1.4 (H) 0.8 - 1.2    Comment: (NOTE) INR goal varies based on  device and disease states. Performed at [facility], [place] [address], [place], [place] [address]   APTT     Status: None   Collection Time: 04/30/21 12:27 AM  Result Value Ref Range   aPTT 29 24 - 36 seconds    Comment: Performed at [facility] [address] [place], [place] [address]  CSF cell count with differential collection tube #: 1     Status: Abnormal   Collection Time: 04/30/21  2:45 AM  Result Value Ref Range   Tube # 1    Color, CSF YELLOW (A) COLORLESS   Appearance, CSF CLOUDY (A) CLEAR   Supernatant XANTHOCHROMIC    RBC Count, CSF 91 (H) 0 /cu mm   WBC, CSF 585 (HH) 0 - 5 /cu mm    Comment: COUNT MAY BE INACCURATE DUE TO FIBRIN CLUMPS. CRITICAL RESULT CALLED TO, READ BACK BY AND VERIFIED WITH:  M. PLUMER RN $RemoveB'[id number]$  04/30/21 K. SANDERS    Segmented Neutrophils-CSF 99 (H) 0 - 6 %   Lymphs, CSF 0 (L) 40 - 80 %   Monocyte-Macrophage-Spinal Fluid 0 (L) 15 - 45 %   Eosinophils, CSF 1 0 - 1 %   Other Cells, CSF       INTRACELLULAR AND EXTRACELLULAR ORGANISMS PRESENT CORRELATE WITH MICRO    Comment: Performed at [facility], [place] [address], [place], [place] [address]  CSF cell count with differential     Status: Abnormal   Collection Time: 04/30/21  2:45 AM  Result Value Ref Range   Tube # 3    Color, CSF YELLOW (A) COLORLESS   Appearance, CSF CLOUDY (A) CLEAR   Supernatant XANTHOCHROMIC    RBC Count, CSF 110 (H) 0 /cu mm   WBC, CSF 510 (HH) 0 - 5 /cu mm    Comment: COUNT MAY BE INACCURATE DUE TO FIBRIN CLUMPS. CRITICAL RESULT CALLED TO, READ BACK BY AND VERIFIED WITH:  M. PLUMER RN $RemoveB'[email or web address]$  04/30/21 K. SANDERS    Segmented Neutrophils-CSF 99 (H) 0 - 6 %   Lymphs, CSF 1 (L) 40 - 80 %   Monocyte-Macrophage-Spinal  Fluid 0 (L) 15 - 45 %   Eosinophils, CSF 0 0 - 1 %   Other Cells, CSF       INTRACELLULAR AND EXTRACELLULAR ORGANISMS PRESENT CORRELATE WITH MICRO    Comment: Performed at [facility], [place] [address] [place],  [address]  Protein and glucose, CSF     Status: Abnormal   Collection Time: 04/30/21  2:47 AM  Result Value Ref Range   Glucose, CSF <20 (LL) 40 - 70 mg/dL    Comment: REPEATED TO VERIFY CRITICAL RESULT CALLED TO, READ BACK BY AND VERIFIED WITH: [place] Bergeron, RN. [phone] 04/30/21 A. MCDOWELL    Total  Protein, CSF >600 (H) 15 - 45 mg/dL    Comment: RESULTS CONFIRMED BY MANUAL DILUTION Performed at [facility] [address], [place], [place] [address]   Glucose, capillary     Status: Abnormal   Collection Time: 04/30/21  5:01 AM  Result Value Ref Range   Glucose-Capillary 153 (H) 70 - 99 mg/dL    Comment: Glucose reference range applies only to samples taken after fasting for at least 8 hours.  MRSA PCR Screening     Status: Abnormal   Collection Time: 04/30/21  5:17 AM   Specimen: Nasal Mucosa; Nasopharyngeal  Result Value Ref Range   MRSA by PCR  POSITIVE (A) NEGATIVE    Comment:        The GeneXpert MRSA Assay (FDA approved for NASAL specimens only), is one component of a comprehensive MRSA colonization surveillance program. It is not intended to diagnose MRSA infection nor to guide or monitor treatment for MRSA infections. RESULT CALLED TO, READ BACK BY AND VERIFIED WITH: PLUMBER,M RN 04/30/2021 AT 3254 SKEEN,P Performed at [facility], [place] [address], [place], [place] [address]   Cryptococcal antigen, CSF     Status: None   Collection Time: 04/30/21  5:27 AM  Result Value Ref Range   Crypto Ag NEGATIVE NEGATIVE   Cryptococcal Ag Titer NOT INDICATED NOT INDICATED    Comment: Performed at [facility], [address] [place], [place] [address]  CBC     Status: Abnormal   Collection Time: 04/30/21  7:35 AM  Result Value Ref Range    WBC 18.9 (H) 4.0 - 10.5 K/uL   RBC 5.51 4.22 - 5.81 MIL/uL   Hemoglobin 17.4 (H) 13.0 - 17.0 g/dL   HCT 51.5 39.0 - 52.0 %   MCV 93.5 80.0 - 100.0 fL   MCH 31.6 26.0 - 34.0 pg   MCHC 33.8 30.0 - 36.0 g/dL   RDW 13.0 11.5 - 15.5 %   Platelets 145 (L) 150 - 400 K/uL   nRBC 0.0 0.0 - 0.2 %    Comment: Performed at [facility], [place] [address] [place], [place] [address]  Basic metabolic panel     Status: Abnormal   Collection Time: 04/30/21  7:35 AM  Result Value Ref Range   Sodium 138 135 - 145 mmol/L   Potassium 3.9 3.5 - 5.1 mmol/L   Chloride 106 98 - 111 mmol/L   CO2 23 22 - 32 mmol/L   Glucose, Bld 127 (H) 70 - 99 mg/dL    Comment: Glucose reference range applies only to samples taken after fasting for at least 8 hours.   BUN 18 8 - 23 mg/dL   Creatinine, Ser 1.13 0.61 - 1.24 mg/dL   Calcium 8.5 (L) 8.9 - 10.3 mg/dL   GFR, Estimated >60 >60 mL/min    Comment: (NOTE) Calculated using the CKD-EPI Creatinine Equation (2021)    Anion gap 9 5 - 15    Comment: Performed at [facility] [address] [place], [place] [address]  Magnesium     Status: Abnormal   Collection Time: 04/30/21  7:35 AM  Result Value Ref Range   Magnesium 1.6 (L) 1.7 - 2.4 mg/dL    Comment: Performed at [facility] [address], [place], [place] [address]  Phosphorus     Status: None   Collection Time: 04/30/21  7:35 AM  Result Value Ref Range   Phosphorus 3.3 2.5 - 4.6 mg/dL    Comment: Performed at [facility] [address], [place], [place] [address]  Hemoglobin A1c     Status: None   Collection Time: 04/30/21  7:39 AM  Result Value Ref Range   Hgb A1c MFr Bld 5.4 4.8 - 5.6 %    Comment: (NOTE) Pre diabetes:          5.7%-6.4%  Diabetes:              >6.4%  Glycemic control for   <7.0% adults with diabetes    Mean Plasma Glucose 108.28 mg/dL    Comment: Performed at [facility], [address] [place], [place] [address]  Glucose, capillary      Status: Abnormal   Collection Time: 04/30/21  7:49 AM  Result Value Ref Range   Glucose-Capillary 137 (H) 70 - 99 mg/dL    Comment: Glucose reference range applies only to samples taken after fasting for at least 8 hours.  TSH     Status: Abnormal   Collection Time: 04/30/21  9:18 AM  Result Value Ref Range   TSH 4.843 (H) 0.350 - 4.500 uIU/mL    Comment: Performed by a 3rd Generation assay with a functional sensitivity of <=0.01 uIU/mL. Performed at [facility], [place] [address] [place], [place] [address]     MICRO: Csf= gpc growing (too young to identify) IMAGING: MR BRAIN W WO CONTRAST  Result Date: 04/30/2021 CLINICAL DATA:  60 year old male code stroke presentation. Altered mental status and headache, agitation. Encephalitis suspected clinically, with elevated CSF opening pressure and xanthochromia on lumbar puncture. EXAM: MRI HEAD WITHOUT AND WITH CONTRAST TECHNIQUE: Multiplanar, multiecho pulse sequences of the brain and surrounding structures were obtained without and with intravenous contrast. CONTRAST:  39mL GADAVIST GADOBUTROL 1 MMOL/ML IV SOLN COMPARISON:  CT head and CTA head and neck yesterday. FINDINGS: Brain: Susceptibility artifact along the left lateral face appears to only affect axial DWI and is of unclear etiology. There is diffusely abnormal FLAIR hyperintensity throughout the cerebral sulci (series 13, image 20) associated with diffuse smooth leptomeningeal enhancement following contrast (series 21, image 11). No pachymeningeal thickening identified. No discrete cerebral edema. No ventriculomegaly or intraventricular debris identified. No ependymal enhancement. There is a cluster of punctate abnormal subarachnoid space diffusion at the posterior midline near the parieto-occipital sulcus on series 5, image 83. No discrete abnormality here on the other sequences. No superimposed restricted diffusion suggestive of acute infarction. No midline shift, mass effect, evidence  of mass lesion, ventriculomegaly, extra-axial collection or acute intracranial hemorrhage. Cervicomedullary junction and pituitary are within normal limits. Vascular: Major intracranial vascular flow voids are preserved. The major dural venous sinuses are enhancing and appear to be patent. Skull and upper cervical spine: Negative visible cervical spine and spinal cord. Visualized bone marrow signal is within normal limits. Sinuses/Orbits: Disconjugate gaze. Otherwise negative orbits. Trace paranasal sinus mucosal thickening. Other: Intubated with fluid in the pharynx and nasal cavity. Mild left mastoid effusion. IMPRESSION: Diffuse leptomeningeal enhancement and abnormal FLAIR signal throughout the subarachnoid spaces compatible with an Acute Meningitis. Punctate DWI foci along the midline parieto-occipital sulcus might indicate trace purulence. But there is no drainable empyema and no focal parenchymal encephalitis. Also no evidence of acute infarct. Electronically Signed   By: Genevie Ann M.D.   On: 04/30/2021 05:12   DG Chest Portable 1 View  Result Date: 04/29/2021 CLINICAL DATA:  Status post intubation EXAM: PORTABLE CHEST 1 VIEW COMPARISON:  None. FINDINGS: Endotracheal tube is noted approximately 3.5 cm above the carina. Gastric catheter extends into the stomach although the proximal side port lies at the gastroesophageal junction. This could be advanced further into the stomach. Cardiac shadow is enlarged accentuated by the portable technique. Mild vascular congestion is noted. IMPRESSION: Tubes and lines as described above. Gastric catheter should be advanced deeper into the stomach. Mild vascular congestion. Electronically Signed   By: Inez Catalina M.D.   On: 04/29/2021 23:45   EEG adult  Result Date: 04/30/2021 Lora Havens, MD     04/30/2021 11:12 AM Patient Name: Hayden Mcbride MRN: [id number] Epilepsy Attending: Lora Havens  Referring Physician/Provider: Dr Amie [place] Date: 04/30/2021  Duration: 24.13 mins Patient history: 60 year old with sudden onset of altered mental status with a preceding headache. EEG to evaluate for seizure. Level of alertness: comatose AEDs during EEG study: None Technical aspects: This EEG study was done with scalp electrodes positioned according to the 10-20 International system of electrode placement. Electrical activity was acquired at a sampling rate of $Remov'500Hz'ebUOsS$  and reviewed with a high frequency filter of $RemoveB'70Hz'UETqGevU$  and a low frequency filter of $RemoveB'1Hz'lRutzBlk$ . EEG data were recorded continuously and digitally stored. Description: EEG showed continuous generalized and lateralized right hemisphere 3 to 6 Hz theta-delta slowing. Hyperventilation and photic stimulation were not performed.   ABNORMALITY - Continuous slow, generalized and lateralized right hemisphere IMPRESSION: This study is suggestive of cortical dysfunction arising from right hemisphere region likely secondary to underlying structural abnormalitywithin normal limits. Additionally, there is moderate diffuse encephalopathy, non specific etiology. No seizures or epileptiform discharges were seen throughout the recording. Lora Havens   CT HEAD CODE STROKE WO CONTRAST  Result Date: 04/29/2021 CLINICAL DATA:  Code stroke.  Stroke, follow up EXAM: CT HEAD WITHOUT CONTRAST TECHNIQUE: Contiguous axial images were obtained from the base of the skull through the vertex without intravenous contrast. COMPARISON:  None. FINDINGS: Brain: There is no mass, hemorrhage or extra-axial collection. The size and configuration of the ventricles and extra-axial CSF spaces are normal. The brain parenchyma is normal, without evidence of acute or chronic infarction. Vascular: No abnormal hyperdensity of the major intracranial arteries or dural venous sinuses. No intracranial atherosclerosis. Skull: The visualized skull base, calvarium and extracranial soft tissues are normal. Sinuses/Orbits: No fluid levels or advanced mucosal thickening  of the visualized paranasal sinuses. Left mastoid effusion. The orbits are normal. ASPECTS [facility] Stroke Program Early CT Score) - Ganglionic level infarction (caudate, lentiform nuclei, internal capsule, insula, M1-M3 cortex): 7 - Supraganglionic infarction (M4-M6 cortex): 3 Total score (0-10 with 10 being normal): 10 IMPRESSION: 1. No acute intracranial abnormality. 2. ASPECTS is 10. These results were communicated to Dr. Lesleigh Noe at 11:09 pm on 04/29/2021 by text page via the [facility] messaging system. Electronically Signed   By: Ulyses Jarred M.D.   On: 04/29/2021 23:09   CT ANGIO HEAD NECK W WO CM W PERF (CODE STROKE)  Result Date: 04/29/2021 CLINICAL DATA:  Acute neurologic deficit EXAM: CT ANGIOGRAPHY HEAD AND NECK TECHNIQUE: Multidetector CT imaging of the head and neck was performed using the standard protocol during bolus administration of intravenous contrast. Multiplanar CT image reconstructions and MIPs were obtained to evaluate the vascular anatomy. Carotid stenosis measurements (when applicable) are obtained utilizing NASCET criteria, using the distal internal carotid diameter as the denominator. CONTRAST:  19mL OMNIPAQUE IOHEXOL 350 MG/ML SOLN COMPARISON:  None. FINDINGS: CTA NECK FINDINGS SKELETON: There is no bony spinal canal stenosis. No lytic or blastic lesion. OTHER NECK: Normal pharynx, larynx and major salivary glands. No cervical lymphadenopathy. 4.7 cm right thyroid nodule. UPPER CHEST: No pneumothorax or pleural effusion. No nodules or masses. AORTIC ARCH: There is no calcific atherosclerosis of the aortic arch. There is no aneurysm, dissection or hemodynamically significant stenosis of the visualized portion of the aorta. Conventional 3 vessel aortic branching pattern. The visualized proximal subclavian arteries are widely patent. RIGHT CAROTID SYSTEM: Normal without aneurysm, dissection or stenosis. LEFT CAROTID SYSTEM: Normal without aneurysm, dissection or stenosis. VERTEBRAL  ARTERIES: Left dominant configuration. Both origins are clearly patent. There is no dissection, occlusion or flow-limiting stenosis to the skull base (V1-V3  segments). CTA HEAD FINDINGS POSTERIOR CIRCULATION: --Vertebral arteries: Normal V4 segments. --Inferior cerebellar arteries: Normal. --Basilar artery: Normal. --Superior cerebellar arteries: Normal. --Posterior cerebral arteries (PCA): Normal. ANTERIOR CIRCULATION: --Intracranial internal carotid arteries: Normal. --Anterior cerebral arteries (ACA): Normal. Both A1 segments are present. Patent anterior communicating artery (a-comm). --Middle cerebral arteries (MCA): Normal. VENOUS SINUSES: As permitted by contrast timing, patent. ANATOMIC VARIANTS: Fetal origin of the left posterior cerebral artery. Review of the MIP images confirms the above findings. IMPRESSION: 1. No emergent large vessel occlusion or high-grade stenosis of the intracranial arteries. 2. 4.7 cm right thyroid nodule. Recommend thyroid US (ref: J Am Coll Radiol. 2015 Feb;12(2): 143-50). Electronically Signed   By: Ulyses Jarred M.D.   On: 04/29/2021 23:21    Assessment/Plan:  60yo M with bacterial meningitis - recently returned from [place]  - recommend to continue with vancomycin/ceftriaxone/ampicillin - until further identification from micro tomorrow morning. 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 Generally its is a gpr on gram stain. Would continue on ampicillin until bacterial identification. 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

## 2021-04-30 NOTE — ED Notes (Signed)
LP completed by MD Agarwala. CSF will be walked to lab.

## 2021-04-30 NOTE — H&P (Signed)
NAME:  Hayden Mcbride, MRN:  [id number], DOB:  August 06, 1961, LOS: 0 ADMISSION DATE:  04/29/2021, CONSULTATION DATE:  04/30/21 REFERRING MD:  EDP, CHIEF COMPLAINT:  AMS   History of Present Illness:  Hayden Mcbride is a 60 year old male with past medical history of hypertension, GERD, colonic polyps, thyroid disease and OSA who presented to the emergency department speaking.  Last known well was around 900 on 5/17.  He was noted to have altered mental status and was combative with staff with slurred speech on arrival.  He was intubated for airway protection and to obtain CT head.  History taken from notes as patient is intubated, per report he had had a headache for 1 day prior to arrival and had an unwitnessed fall.  Head CT and CTA was negative for acute findings.  Labs notable for white blood cell count of 18 K, potassium 3.2, glucose 129, bilirubin 2.6, UDS negative except for benzos but was given Versed, lactic acid 3.1.  Blood cultures were drawn and he was started on broad-spectrum antibiotics.  LP pending for evaluation of possible meningitis.  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  He is outside here quite a bit, but no noted tick bites or mosquito bites. No recent rashes or fevers.  Pt was in his usual state of health other than has had some R ear pain (cholesteotoma removed some time ago).  On 5/17 he c/o headache and seemed fatigued then became altered.  Pertinent  Medical History   has a past medical history of Allergy, Dry eyes, GERD (gastroesophageal reflux disease), Glaucoma, adenomatous colonic polyps (07/08/2006), Hypertension, Post-operative nausea and vomiting, Sleep apnea, and Thyroid disease.   Significant Hospital Events: Including procedures, antibiotic start and stop dates in addition to other pertinent events   . 5/17 presented to ED, intubated  for agitation and airway protection . 5/18 PCCM consulted for admission, lumbar puncture completed   Tubes/lines ETT 5/17-  Antibiotics:  Ampicillin 5/17- Ceftriaxone 5/17- Vancomycin 5/17-   Micro: 5/18 CSF culture and gram stain>> 5/18 CSF fungal cx>>     Interim History / Subjective:  Pt hemodynamically stable, intubated and sedated  Objective   Blood pressure 123/70, pulse 69, temperature 98.2 F (36.8 C), temperature source Rectal, resp. rate 17, height 5\' 11"  (1.803 m), weight 116.8 kg, SpO2 96 %.    Vent Mode: PRVC FiO2 (%):  [40 %-100 %] 40 % Set Rate:  [16 bmp] 16 bmp Vt Set:  [600 mL] 600 mL PEEP:  [5 cmH20] 5 cmH20 Plateau Pressure:  [16 cmH20] 16 cmH20   Intake/Output Summary (Last 24 hours) at 04/30/2021 0121 Last data filed at 04/30/2021 0033 Gross per 24 hour  Intake 200 ml  Output 50 ml  Net 150 ml   Filed Weights   04/29/21 2317  Weight: 116.8 kg    General:  Overweight M, intubated and sedated HEENT: MM pink/moist, sclera slightly injected with pupils equal and reactive Neuro: intubated and on Fentanyl gtt, PERRLA,  CV: s1s2 rrr, no m/r/g PULM:  Mechanical breath sounds bilaterally without wheezing or rhonchi, on full vent support GI: soft, bsx4 active  Extremities: warm/dry, no edema  Skin: no rashes or lesions   Labs/imaging that I havepersonally reviewed  (right click and "Reselect all SmartList Selections" daily)  CBC-WBC 18k BMP Lactic acid  Resolved Hospital Problem list  Assessment & Plan:    AMS, history consistent with possible encephalitis Sudden onset headache and dysarthria with elevated LP opening pressure  And CT/CTA were negative UDS unremarkable P: -continue ampicillin, ceftriaxone,, vancomycin - CSF cultures, glucose, cell count, protein pending, add acyclovir -Stat MRI brain -TSH --Maintain full vent support with SAT/SBT as tolerated -titrate Vent setting to maintain SpO2 greater than or equal to  90%. -HOB elevated 30 degrees. -Plateau pressures less than 30 cm H20.  -Follow chest x-ray, ABG prn.   -Bronchial hygiene and RT/bronchodilator protocol. -Neurology following, consider EEG in the a.m.   History of hypertension On Norvasc, Cozaar at home P: -Hold home meds in the setting of soft blood pressure -Monitor to maintain MAP greater than 65   Best practice (right click and "Reselect all SmartList Selections" daily)  Diet:  NPO Pain/Anxiety/Delirium protocol (if indicated): Yes (RASS goal +1) VAP protocol (if indicated): Yes DVT prophylaxis: Contraindicated LP GI prophylaxis: PPI Glucose control:  SSI Yes Central venous access:  N/A Arterial line:  N/A Foley:  Yes, and it is still needed Mobility:  bed rest  PT consulted: N/A Last date of multidisciplinary goals of care discussion [pending] Code Status:  full code Disposition: ICU  Labs   CBC: Recent Labs  Lab 04/29/21 2234 04/29/21 2240 04/29/21 2358  WBC 18.3*  --   --   NEUTROABS 16.9*  --   --   HGB 18.7* 18.7* 18.4*  HCT 55.4* 55.0* 54.0*  MCV 94.4  --   --   PLT 142*  --   --     Basic Metabolic Panel: Recent Labs  Lab 04/29/21 2234 04/29/21 2240 04/29/21 2358  NA 139 142 141  K 3.2* 3.2* 2.8*  CL 105 104  --   CO2 21*  --   --   GLUCOSE 129* 125*  --   BUN 13 17  --   CREATININE 1.05 0.80  --   CALCIUM 9.0  --   --    GFR: Estimated Creatinine Clearance: 126 mL/min (by C-G formula based on SCr of 0.8 mg/dL). Recent Labs  Lab 04/29/21 2234 04/29/21 2354  WBC 18.3*  --   LATICACIDVEN  --  3.1*    Liver Function Tests: Recent Labs  Lab 04/29/21 2234  AST 32  ALT 27  ALKPHOS 50  BILITOT 2.6*  PROT 7.0  ALBUMIN 4.0   No results for input(s): LIPASE, AMYLASE in the last 168 hours. No results for input(s): AMMONIA in the last 168 hours.  ABG    Component Value Date/Time   PHART 7.345 (L) 04/29/2021 2358   PCO2ART 43.4 04/29/2021 2358   PO2ART 342 (H) 04/29/2021 2358    HCO3 23.8 04/29/2021 2358   TCO2 25 04/29/2021 2358   ACIDBASEDEF 2.0 04/29/2021 2358   O2SAT 100.0 04/29/2021 2358     Coagulation Profile: Recent Labs  Lab 04/30/21 0027  INR PENDING    Cardiac Enzymes: No results for input(s): CKTOTAL, CKMB, CKMBINDEX, TROPONINI in the last 168 hours.  HbA1C: No results found for: HGBA1C  CBG: Recent Labs  Lab 04/29/21 2233  GLUCAP 152*    Review of Systems:   Unable to obtain secondary to mental status  Past Medical History:  He,  has a past medical history of Allergy, Dry eyes, GERD (gastroesophageal reflux disease), Glaucoma, adenomatous colonic polyps (07/08/2006), Hypertension, Post-operative nausea and vomiting, Sleep apnea, and Thyroid disease.   Surgical History:   Past Surgical History:  Procedure Laterality Date  .  BREATH TEK H PYLORI N/A 12/24/2014   Procedure: BREATH TEK H PYLORI;  Surgeon: Alphonsa Overall, MD;  Location: [facility];  Service: General;  Laterality: N/A;  . CHOLECYSTECTOMY    . COLONOSCOPY    . EYE SURGERY     age 73   . 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 POLYPECTOMY    . SHOULDER SURGERY     left shoulder  . THYROID SURGERY       Social History:   reports that he quit smoking about 36 years ago. His smoking use included cigarettes. He quit after 10.00 years of use. He has never used smokeless tobacco. He reports current alcohol use of about 2.0 standard drinks of alcohol per week. He reports that he does not use drugs.   Family History:  His family history includes Cancer in his maternal grandmother; Colon cancer in his father; Diverticulitis in his mother. There is no history of Esophageal cancer, Stomach cancer, Rectal cancer, or Colon polyps.   Allergies Allergies  Allergen Reactions  . Demerol [Meperidine]     Large doses cause nausea and vomiting     Home Medications  Prior to Admission medications   Medication Sig Start Date End Date Taking? Authorizing Provider   amLODipine (NORVASC) 5 MG tablet Take 5 mg by mouth daily. 04/18/21  Yes [provider]  ibuprofen (ADVIL) 200 MG tablet Take 200-400 mg by mouth every 6 (six) hours as needed for headache or moderate pain.   Yes [provider]  losartan (COZAAR) 100 MG tablet Take 100 mg by mouth daily.   Yes [provider]  Multiple Vitamins-Minerals (ONE-A-DAY MENS 50+) TABS Take 1 tablet by mouth daily.   Yes [provider]  omeprazole (PRILOSEC) 20 MG capsule Take 20 mg by mouth daily.    Yes [provider]     Critical care time: 50 minutes      CRITICAL CARE Performed by: Otilio Carpen Karol Liendo   Total critical care time: 50 minutes  Critical care time was exclusive of separately billable procedures and treating other patients.  Critical care was necessary to treat or prevent imminent or life-threatening deterioration.  Critical care was time spent personally by me on the following activities: development of treatment plan with patient and/or surrogate as well as nursing, discussions with consultants, evaluation of patient's response to treatment, examination of patient, obtaining history from patient or surrogate, ordering and performing treatments and interventions, ordering and review of laboratory studies, ordering and review of radiographic studies, pulse oximetry and re-evaluation of patient's condition.   Otilio Carpen Treylon Henard, PA-C [facility] See Amion for pager If no response to pager , please call [phone] until 7pm After 7:00 pm call Elink  [phone]?[place]

## 2021-04-30 NOTE — Progress Notes (Signed)
Patient transported to CT and back without complications.  

## 2021-04-30 NOTE — Progress Notes (Signed)
[facility] Progress Note Patient Name: TYKE OUTMAN DOB: [birth year] MRN: [id number]   Date of Service  04/30/2021  HPI/Events of Note  Patient admitted with altered mental status and intubated for acute respiratory failure secondary to inability to protect his airway, LP is suspicious for evolving encephalitis, patient is on empiric broad spectrum anti-microbial coverage, including Acyclovir for possible encephalitis, work up is in process.  eICU Interventions  New Patient Evaluation.        Kerry Kass Vesper Trant 04/30/2021, 3:43 AM

## 2021-05-01 DIAGNOSIS — R4182 Altered mental status, unspecified: Secondary | ICD-10-CM | POA: Diagnosis not present

## 2021-05-01 DIAGNOSIS — G009 Bacterial meningitis, unspecified: Secondary | ICD-10-CM

## 2021-05-01 DIAGNOSIS — J9601 Acute respiratory failure with hypoxia: Secondary | ICD-10-CM | POA: Diagnosis not present

## 2021-05-01 DIAGNOSIS — G001 Pneumococcal meningitis: Secondary | ICD-10-CM | POA: Diagnosis not present

## 2021-05-01 LAB — POCT I-STAT 7, (LYTES, BLD GAS, ICA,H+H)
Acid-Base Excess: 0 mmol/L (ref 0.0–2.0)
Bicarbonate: 25.1 mmol/L (ref 20.0–28.0)
Calcium, Ion: 1.16 mmol/L (ref 1.15–1.40)
HCT: 43 % (ref 39.0–52.0)
Hemoglobin: 14.6 g/dL (ref 13.0–17.0)
O2 Saturation: 98 %
Potassium: 3.8 mmol/L (ref 3.5–5.1)
Sodium: 143 mmol/L (ref 135–145)
TCO2: 26 mmol/L (ref 22–32)
pCO2 arterial: 43 mmHg (ref 32.0–48.0)
pH, Arterial: 7.374 (ref 7.350–7.450)
pO2, Arterial: 105 mmHg (ref 83.0–108.0)

## 2021-05-01 LAB — COMPREHENSIVE METABOLIC PANEL
ALT: 24 U/L (ref 0–44)
AST: 19 U/L (ref 15–41)
Albumin: 2.8 g/dL — ABNORMAL LOW (ref 3.5–5.0)
Alkaline Phosphatase: 37 U/L — ABNORMAL LOW (ref 38–126)
Anion gap: 7 (ref 5–15)
BUN: 24 mg/dL — ABNORMAL HIGH (ref 8–23)
CO2: 23 mmol/L (ref 22–32)
Calcium: 8.3 mg/dL — ABNORMAL LOW (ref 8.9–10.3)
Chloride: 111 mmol/L (ref 98–111)
Creatinine, Ser: 0.98 mg/dL (ref 0.61–1.24)
GFR, Estimated: >60 mL/min (ref >60)
Glucose, Bld: 137 mg/dL — ABNORMAL HIGH (ref 70–99)
Potassium: 3.9 mmol/L (ref 3.5–5.1)
Sodium: 141 mmol/L (ref 135–145)
Total Bilirubin: 0.4 mg/dL (ref 0.3–1.2)
Total Protein: 6.1 g/dL — ABNORMAL LOW (ref 6.5–8.1)

## 2021-05-01 LAB — CBC
HCT: 47.3 % (ref 39.0–52.0)
Hemoglobin: 16.1 g/dL (ref 13.0–17.0)
MCH: 32 pg (ref 26.0–34.0)
MCHC: 34 g/dL (ref 30.0–36.0)
MCV: 94 fL (ref 80.0–100.0)
Platelets: UNDETERMINED 10*3/uL (ref 150–400)
RBC: 5.03 MIL/uL (ref 4.22–5.81)
RDW: 13.5 % (ref 11.5–15.5)
WBC: 19.2 10*3/uL — ABNORMAL HIGH (ref 4.0–10.5)
nRBC: 0 % (ref 0.0–0.2)

## 2021-05-01 LAB — GLUCOSE, CAPILLARY
Glucose-Capillary: 149 mg/dL — ABNORMAL HIGH (ref 70–99)
Glucose-Capillary: 154 mg/dL — ABNORMAL HIGH (ref 70–99)
Glucose-Capillary: 152 mg/dL — ABNORMAL HIGH (ref 70–99)
Glucose-Capillary: 140 mg/dL — ABNORMAL HIGH (ref 70–99)
Glucose-Capillary: 151 mg/dL — ABNORMAL HIGH (ref 70–99)
Glucose-Capillary: 150 mg/dL — ABNORMAL HIGH (ref 70–99)

## 2021-05-01 LAB — MAGNESIUM: Magnesium: 2.5 mg/dL — ABNORMAL HIGH (ref 1.7–2.4)

## 2021-05-01 LAB — VANCOMYCIN, TROUGH: Vancomycin Tr: 8 ug/mL — ABNORMAL LOW (ref 15–20)

## 2021-05-01 LAB — PHOSPHORUS: Phosphorus: 2.8 mg/dL (ref 2.5–4.6)

## 2021-05-01 MED ORDER — VANCOMYCIN HCL 1000 MG/200ML IV SOLN
1000.0000 mg | INTRAVENOUS | Status: AC
  Administered 2021-05-01: 1000 mg via INTRAVENOUS
  Filled 2021-05-01: qty 200

## 2021-05-01 MED ORDER — QUETIAPINE FUMARATE 50 MG PO TABS
50.0000 mg | ORAL_TABLET | Freq: Two times a day (BID) | ORAL | Status: DC
  Administered 2021-05-01 – 2021-05-07 (×13): 50 mg
  Filled 2021-05-01 (×12): qty 1
  Filled 2021-05-01: qty 2

## 2021-05-01 MED ORDER — VANCOMYCIN HCL 1000 MG/200ML IV SOLN
1000.0000 mg | Freq: Three times a day (TID) | INTRAVENOUS | Status: DC
  Administered 2021-05-01 – 2021-05-02 (×2): 1000 mg via INTRAVENOUS
  Filled 2021-05-01 (×3): qty 200

## 2021-05-01 MED ORDER — POTASSIUM CHLORIDE 20 MEQ PO PACK
40.0000 meq | PACK | Freq: Once | ORAL | Status: AC
  Administered 2021-05-01: 40 meq

## 2021-05-01 MED ORDER — POTASSIUM CHLORIDE 20 MEQ PO PACK: 40.0000 meq | PACK | Freq: Once | ORAL | Status: DC

## 2021-05-01 NOTE — Progress Notes (Signed)
Patient transported from 4N to 1Y59 without complications. RN at bedside.

## 2021-05-01 NOTE — Progress Notes (Signed)
RT NOTE: patient placed on  CPAP/PSV of 8/5 at 0745.  Currently tolerating well.  Will continue to monitor.

## 2021-05-01 NOTE — Progress Notes (Signed)
[facility] for Infectious Disease    Date of Admission:  04/29/2021   Total days of antibiotics 3           ID: MAJESTY OEHLERT is a 60 y.o. male with strep pneumo bacterial meningitis Active Problems:   AMS (altered mental status)    Subjective: Afebrile. Following commands somewhat. On vent trial. Still agitated when fentanyl weaned. Not on pressors.  Right dorsum of hand PIV-- localized swelling concern for infitrated PIV --subsequently pulled. Patient only has 1 PIV  Wife at his bedside - reports he is generally in good health. Sudden onset of AMS.  Medications:  . chlorhexidine gluconate (MEDLINE KIT)  15 mL Mouth Rinse BID  . Chlorhexidine Gluconate Cloth  6 each Topical Daily  . dexamethasone (DECADRON) injection  10 mg Intravenous Q6H  . feeding supplement (PROSource TF)  45 mL Per Tube TID  . fentaNYL (SUBLIMAZE) injection  50 mcg Intravenous Once  . insulin aspart  0-15 Units Subcutaneous Q4H  . mouth rinse  15 mL Mouth Rinse 10 times per day  . mupirocin ointment  1 application Nasal BID  . pantoprazole sodium  40 mg Per Tube Daily  . QUEtiapine  50 mg Per Tube BID    Objective: Vital signs in last 24 hours: Temp:  [98.4 F (36.9 C)-99.3 F (37.4 C)] 98.6 F (37 C) (05/19 1200) Pulse Rate:  [55-86] 62 (05/19 1200) Resp:  [14-25] 14 (05/19 1200) BP: (108-148)/(63-91) 126/77 (05/19 1200) SpO2:  [94 %-98 %] 96 % (05/19 1200) FiO2 (%):  [40 %] 40 % (05/19 0748) Physical Exam  Constitutional: opens eyes to touch/or verbal stimuli. He appears well-developed and well-nourished. No distress.  HENT:  Mouth/Throat: Oropharynx is clear and moist. No oropharyngeal exudate.  Cardiovascular: Normal rate, regular rhythm and normal heart sounds. Exam reveals no gallop and no friction rub.  No murmur heard.  Pulmonary/Chest: Effort normal and breath sounds --mild exp. Rhonchi- bilaterally. No respiratory distress.  Abdominal: Soft. Bowel sounds are normal. He  exhibits no distension. There is no tenderness.  Lymphadenopathy:  He has no cervical adenopathy.  Neurological: opens eyes spontaneously, no longer having disconjugate gaze. Skin: Skin is warm and dry. No rash noted. No erythema.    Lab Results Recent Labs    04/30/21 0735 05/01/21 0207 05/01/21 0436  WBC 18.9* 19.2*  --   HGB 17.4* 16.1 14.6  HCT 51.5 47.3 43.0  NA 138 141 143  K 3.9 3.9 3.8  CL 106 111  --   CO2 23 23  --   BUN 18 24*  --   CREATININE 1.13 0.98  --    Liver Panel Recent Labs    04/29/21 2234 05/01/21 0207  PROT 7.0 6.1*  ALBUMIN 4.0 2.8*  AST 32 19  ALT 27 24  ALKPHOS 50 37*  BILITOT 2.6* 0.4    Microbiology: 5/18 blood cx -NGTD 5/18 CSF cx - strep pneumonaie -- sensi pending Studies/Results: MR BRAIN W WO CONTRAST  Result Date: 04/30/2021 CLINICAL DATA:  60 year old male code stroke presentation. Altered mental status and headache, agitation. Encephalitis suspected clinically, with elevated CSF opening pressure and xanthochromia on lumbar puncture. EXAM: MRI HEAD WITHOUT AND WITH CONTRAST TECHNIQUE: Multiplanar, multiecho pulse sequences of the brain and surrounding structures were obtained without and with intravenous contrast. CONTRAST:  60mL GADAVIST GADOBUTROL 1 MMOL/ML IV SOLN COMPARISON:  CT head and CTA head and neck yesterday. FINDINGS: Brain: Susceptibility artifact along the left lateral  face appears to only affect axial DWI and is of unclear etiology. There is diffusely abnormal FLAIR hyperintensity throughout the cerebral sulci (series 13, image 20) associated with diffuse smooth leptomeningeal enhancement following contrast (series 21, image 11). No pachymeningeal thickening identified. No discrete cerebral edema. No ventriculomegaly or intraventricular debris identified. No ependymal enhancement. There is a cluster of punctate abnormal subarachnoid space diffusion at the posterior midline near the parieto-occipital sulcus on series 5, image  83. No discrete abnormality here on the other sequences. No superimposed restricted diffusion suggestive of acute infarction. No midline shift, mass effect, evidence of mass lesion, ventriculomegaly, extra-axial collection or acute intracranial hemorrhage. Cervicomedullary junction and pituitary are within normal limits. Vascular: Major intracranial vascular flow voids are preserved. The major dural venous sinuses are enhancing and appear to be patent. Skull and upper cervical spine: Negative visible cervical spine and spinal cord. Visualized bone marrow signal is within normal limits. Sinuses/Orbits: Disconjugate gaze. Otherwise negative orbits. Trace paranasal sinus mucosal thickening. Other: Intubated with fluid in the pharynx and nasal cavity. Mild left mastoid effusion. IMPRESSION: Diffuse leptomeningeal enhancement and abnormal FLAIR signal throughout the subarachnoid spaces compatible with an Acute Meningitis. Punctate DWI foci along the midline parieto-occipital sulcus might indicate trace purulence. But there is no drainable empyema and no focal parenchymal encephalitis. Also no evidence of acute infarct. Electronically Signed   By: Genevie Ann M.D.   On: 04/30/2021 05:12   DG Chest Portable 1 View  Result Date: 04/29/2021 CLINICAL DATA:  Status post intubation EXAM: PORTABLE CHEST 1 VIEW COMPARISON:  None. FINDINGS: Endotracheal tube is noted approximately 3.5 cm above the carina. Gastric catheter extends into the stomach although the proximal side port lies at the gastroesophageal junction. This could be advanced further into the stomach. Cardiac shadow is enlarged accentuated by the portable technique. Mild vascular congestion is noted. IMPRESSION: Tubes and lines as described above. Gastric catheter should be advanced deeper into the stomach. Mild vascular congestion. Electronically Signed   By: Inez Catalina M.D.   On: 04/29/2021 23:45   DG Abd Portable 1V  Result Date: 04/30/2021 CLINICAL DATA:   Check gastric catheter placement EXAM: PORTABLE ABDOMEN - 1 VIEW COMPARISON:  None FINDINGS: Gastric catheter is noted in the distal stomach in satisfactory position. Scattered mildly dilated loops of small bowel are noted. IMPRESSION: Gastric catheter as described. Findings consistent with mild small bowel dilatation. Electronically Signed   By: Inez Catalina M.D.   On: 04/30/2021 18:13   EEG adult  Result Date: 04/30/2021 Lora Havens, MD     04/30/2021 11:12 AM Patient Name: JORRELL KUSTER MRN: [id number] Epilepsy Attending: Lora Havens Referring Physician/Provider: Dr Amie Portland Date: 04/30/2021 Duration: 24.13 mins Patient history: 60 year old with sudden onset of altered mental status with a preceding headache. EEG to evaluate for seizure. Level of alertness: comatose AEDs during EEG study: None Technical aspects: This EEG study was done with scalp electrodes positioned according to the 10-20 International system of electrode placement. Electrical activity was acquired at a sampling rate of $Remov'500Hz'ezBzji$  and reviewed with a high frequency filter of $RemoveB'70Hz'FNPYdSUi$  and a low frequency filter of $RemoveB'1Hz'GqKBAure$ . EEG data were recorded continuously and digitally stored. Description: EEG showed continuous generalized and lateralized right hemisphere 3 to 6 Hz theta-delta slowing. Hyperventilation and photic stimulation were not performed.   ABNORMALITY - Continuous slow, generalized and lateralized right hemisphere IMPRESSION: This study is suggestive of cortical dysfunction arising from right hemisphere region likely secondary to underlying structural abnormalitywithin  normal limits. Additionally, there is moderate diffuse encephalopathy, non specific etiology. No seizures or epileptiform discharges were seen throughout the recording. Lora Havens   CT HEAD CODE STROKE WO CONTRAST  Result Date: 04/29/2021 CLINICAL DATA:  Code stroke.  Stroke, follow up EXAM: CT HEAD WITHOUT CONTRAST TECHNIQUE: Contiguous axial images were  obtained from the base of the skull through the vertex without intravenous contrast. COMPARISON:  None. FINDINGS: Brain: There is no mass, hemorrhage or extra-axial collection. The size and configuration of the ventricles and extra-axial CSF spaces are normal. The brain parenchyma is normal, without evidence of acute or chronic infarction. Vascular: No abnormal hyperdensity of the major intracranial arteries or dural venous sinuses. No intracranial atherosclerosis. Skull: The visualized skull base, calvarium and extracranial soft tissues are normal. Sinuses/Orbits: No fluid levels or advanced mucosal thickening of the visualized paranasal sinuses. Left mastoid effusion. The orbits are normal. ASPECTS [facility] Stroke Program Early CT Score) - Ganglionic level infarction (caudate, lentiform nuclei, internal capsule, insula, M1-M3 cortex): 7 - Supraganglionic infarction (M4-M6 cortex): 3 Total score (0-10 with 10 being normal): 10 IMPRESSION: 1. No acute intracranial abnormality. 2. ASPECTS is 10. These results were communicated to Dr. Lesleigh Noe at 11:09 pm on 04/29/2021 by text page via the [facility] messaging system. Electronically Signed   By: Ulyses Jarred M.D.   On: 04/29/2021 23:09   CT ANGIO HEAD NECK W WO CM W PERF (CODE STROKE)  Result Date: 04/29/2021 CLINICAL DATA:  Acute neurologic deficit EXAM: CT ANGIOGRAPHY HEAD AND NECK TECHNIQUE: Multidetector CT imaging of the head and neck was performed using the standard protocol during bolus administration of intravenous contrast. Multiplanar CT image reconstructions and MIPs were obtained to evaluate the vascular anatomy. Carotid stenosis measurements (when applicable) are obtained utilizing NASCET criteria, using the distal internal carotid diameter as the denominator. CONTRAST:  1109mL OMNIPAQUE IOHEXOL 350 MG/ML SOLN COMPARISON:  None. FINDINGS: CTA NECK FINDINGS SKELETON: There is no bony spinal canal stenosis. No lytic or blastic lesion. OTHER NECK: Normal  pharynx, larynx and major salivary glands. No cervical lymphadenopathy. 4.7 cm right thyroid nodule. UPPER CHEST: No pneumothorax or pleural effusion. No nodules or masses. AORTIC ARCH: There is no calcific atherosclerosis of the aortic arch. There is no aneurysm, dissection or hemodynamically significant stenosis of the visualized portion of the aorta. Conventional 3 vessel aortic branching pattern. The visualized proximal subclavian arteries are widely patent. RIGHT CAROTID SYSTEM: Normal without aneurysm, dissection or stenosis. LEFT CAROTID SYSTEM: Normal without aneurysm, dissection or stenosis. VERTEBRAL ARTERIES: Left dominant configuration. Both origins are clearly patent. There is no dissection, occlusion or flow-limiting stenosis to the skull base (V1-V3 segments). CTA HEAD FINDINGS POSTERIOR CIRCULATION: --Vertebral arteries: Normal V4 segments. --Inferior cerebellar arteries: Normal. --Basilar artery: Normal. --Superior cerebellar arteries: Normal. --Posterior cerebral arteries (PCA): Normal. ANTERIOR CIRCULATION: --Intracranial internal carotid arteries: Normal. --Anterior cerebral arteries (ACA): Normal. Both A1 segments are present. Patent anterior communicating artery (a-comm). --Middle cerebral arteries (MCA): Normal. VENOUS SINUSES: As permitted by contrast timing, patent. ANATOMIC VARIANTS: Fetal origin of the left posterior cerebral artery. Review of the MIP images confirms the above findings. IMPRESSION: 1. No emergent large vessel occlusion or high-grade stenosis of the intracranial arteries. 2. 4.7 cm right thyroid nodule. Recommend thyroid US (ref: J Am Coll Radiol. 2015 Feb;12(2): 143-50). Electronically Signed   By: Ulyses Jarred M.D.   On: 04/29/2021 23:21     Assessment/Plan: Strep pneumoniae meningitis = narrow abtx to ceftriaxone plus vancomycin for the time being.  We will follow up on sensitivities to see if can narrow further. Once he is extubated, can get a better understanding  on neuro insult for meningitis.   Peripheral access = may need new access.   Health maintenance = he last had his covid booster 5-6 months ago.   [facility] for Infectious Diseases Cell: [phone] Pager: [phone]  05/01/2021, 1:28 PM

## 2021-05-01 NOTE — Progress Notes (Signed)
Pharmacy Antibiotic Note  Hayden Mcbride is a 60 y.o. male admitted on 04/29/2021 presenting as code stroke, pneumococcal meningitis (susceptibilities pending). Pharmacy has been consulted for vancomycin dosing. Also on ceftriaxone. SCr back down to 0.98.  Vancomycin trough resulted low today at 8, drawn appropriately.  Plan: Give vancomycin 1g IV x 1; then increase vancomycin to 1g IV q8h (target vancomycin trough 15-20) Ceftriaxone 2g IV q12h Monitor renal function, c/s, clinical progress repeat vancomycin level at new steady state F/u ID plans   Height: 5\' 11"  (180.3 cm) Weight: 116.8 kg (257 lb 8 oz) IBW/kg (Calculated) : 75.3  Temp (24hrs), Avg:98.9 F (37.2 C), Min:98.4 F (36.9 C), Max:99.3 F (37.4 C)  Recent Labs  Lab 04/29/21 2234 04/29/21 2240 04/29/21 2354 04/30/21 0735 04/30/21 1213 05/01/21 0207 05/01/21 1135  WBC 18.3*  --   --  18.9*  --  19.2*  --   CREATININE 1.05 0.80  --  1.13  --  0.98  --   LATICACIDVEN  --   --  3.1*  --  2.8*  --   --   VANCOTROUGH  --   --   --   --   --   --  8*    Estimated Creatinine Clearance: 102.9 mL/min (by C-G formula based on SCr of 0.98 mg/dL).    Allergies  Allergen Reactions  . Demerol [Meperidine]     Large doses cause nausea and vomiting     Arturo Morton, PharmD, BCPS Please check AMION for all [facility] contact numbers Clinical Pharmacist 05/01/2021 1:06 PM

## 2021-05-01 NOTE — Progress Notes (Signed)
Neurology Progress Note   S:// Seen and examined CSF results consistent with bacterial meningitis, CSF culture growing gram-positive cocci with predominantly mononuclear WBCs. MRI brain also reviewed personally-consistent with meningitis. Appreciate ID consultation- recommendations for continuing vancomycin, ceftriaxone and ampicillin and discontinuation of acyclovir.  O:// Current vital signs: BP (!) 124/91 (BP Location: Left Arm)   Pulse 70   Temp 99.2 F (37.3 C) (Axillary)   Resp 17   Ht $R'5\' 11"'wR$  (1.803 m)   Wt 116.8 kg   SpO2 97%   BMI 35.91 kg/m  Vital signs in last 24 hours: Temp:  [98.4 F (36.9 C)-99.3 F (37.4 C)] 99.2 F (37.3 C) (05/19 0800) Pulse Rate:  [48-86] 70 (05/19 0800) Resp:  [16-23] 17 (05/19 0800) BP: (68-139)/(57-91) 124/91 (05/19 0800) SpO2:  [95 %-100 %] 97 % (05/19 0800) FiO2 (%):  [40 %] 40 % (05/19 0748) General: Sedated intubated HEENT: Normocephalic atraumatic, moderate degree of neck stiffness while trying to flex his neck, much more supple on cytocide movement. CVS: Regular rate rhythm Abdomen nondistended nontender Respiratory: Vented Neurological exam Opens eyes to voice, attempts to track the examiner albeit very weakly. Gets extremely agitated and starts moving all 4 extremities Wiggle toes to command. Nonverbal Cranial nerves: Disconjugate gaze-improved from yesterday, equal pupils bilaterally, unable to properly assess extraocular movements, facial symmetry also difficult to ascertain due to ET tube. Motor examination: Mild grimacing on noxious stimulation to both upper extremities.  Able to wiggle toes to command. Sensory exam: As above Coordination cannot be assessed  Overall somewhat more agitated today compared to yesterday  Medications  Current Facility-Administered Medications:  .  0.9 %  sodium chloride infusion, , Intravenous, PRN, Kipp Brood, MD, Last Rate: 10 mL/hr at 05/01/21 0800, Infusion Verify at 05/01/21  0800 .  ampicillin (OMNIPEN) 2 g in sodium chloride 0.9 % 100 mL IVPB, 2 g, Intravenous, Q4H, Gleason, Otilio Carpen, PA-C, Stopped at 05/01/21 0736 .  cefTRIAXone (ROCEPHIN) 2 g in sodium chloride 0.9 % 100 mL IVPB, 2 g, Intravenous, Q12H, Gleason, Otilio Carpen, PA-C, Stopped at 04/30/21 2205 .  chlorhexidine gluconate (MEDLINE KIT) (PERIDEX) 0.12 % solution 15 mL, 15 mL, Mouth Rinse, BID, Agarwala, Ravi, MD, 15 mL at 05/01/21 0716 .  Chlorhexidine Gluconate Cloth 2 % PADS 6 each, 6 each, Topical, Daily, Kipp Brood, MD, 6 each at 05/01/21 0400 .  dexamethasone (DECADRON) injection 10 mg, 10 mg, Intravenous, Q6H, Amie Portland, MD, 10 mg at 05/01/21 0345 .  dexmedetomidine (PRECEDEX) 400 MCG/100ML (4 mcg/mL) infusion, 0.4-1.2 mcg/kg/hr, Intravenous, Titrated, Ogan, Kerry Kass, MD, Stopped at 04/30/21 1019 .  docusate (COLACE) 50 MG/5ML liquid 100 mg, 100 mg, Per Tube, BID PRN, Jacky Kindle, MD .  feeding supplement (PROSource TF) liquid 45 mL, 45 mL, Per Tube, TID, Jacky Kindle, MD, 45 mL at 04/30/21 2135 .  feeding supplement (VITAL AF 1.2 CAL) liquid 1,000 mL, 1,000 mL, Per Tube, Continuous, Chand, Sudham, MD, Last Rate: 40 mL/hr at 05/01/21 0000, Rate Change at 05/01/21 0000 .  fentaNYL (SUBLIMAZE) bolus via infusion 50-100 mcg, 50-100 mcg, Intravenous, Q15 min PRN, Gleason, Otilio Carpen, PA-C, 50 mcg at 04/30/21 0039 .  fentaNYL (SUBLIMAZE) injection 50 mcg, 50 mcg, Intravenous, Once, Gleason, Otilio Carpen, PA-C .  fentaNYL 2541mcg in NS 229mL (59mcg/ml) infusion-PREMIX, 50-200 mcg/hr, Intravenous, Continuous, Gleason, Otilio Carpen, PA-C, Last Rate: 10 mL/hr at 05/01/21 0800, 100 mcg/hr at 05/01/21 0800 .  insulin aspart (novoLOG) injection 0-15 Units, 0-15 Units, Subcutaneous, Q4H, Gleason, Otilio Carpen, PA-C,  3 Units at 05/01/21 0808 .  lactated ringers infusion, , Intravenous, Continuous, Chand, Sudham, MD, Last Rate: 125 mL/hr at 05/01/21 0800, Infusion Verify at 05/01/21 0800 .  LORazepam (ATIVAN) injection 2 mg, 2  mg, Intravenous, Q6H PRN, Jacky Kindle, MD, 2 mg at 05/01/21 0237 .  MEDLINE mouth rinse, 15 mL, Mouth Rinse, 10 times per day, Agarwala, Einar Grad, MD, 15 mL at 05/01/21 0601 .  mupirocin ointment (BACTROBAN) 2 % 1 application, 1 application, Nasal, BID, Kipp Brood, MD, 1 application at 67/67/20 2135 .  norepinephrine (LEVOPHED) 4mg  in 232mL premix infusion, 2-10 mcg/min, Intravenous, Titrated, Jacky Kindle, MD, Stopped at 04/30/21 1408 .  pantoprazole sodium (PROTONIX) 40 mg/20 mL oral suspension 40 mg, 40 mg, Per Tube, Daily, Chand, Sudham, MD, 40 mg at 04/30/21 1055 .  polyethylene glycol (MIRALAX / GLYCOLAX) packet 17 g, 17 g, Per Tube, Daily PRN, Jacky Kindle, MD .  vancomycin (VANCOREADY) IVPB 1000 mg/200 mL, 1,000 mg, Intravenous, Q12H, Gleason, Otilio Carpen, PA-C, Stopped at 05/01/21 0106 Labs CBC    Component Value Date/Time   WBC 19.2 (H) 05/01/2021 0207   RBC 5.03 05/01/2021 0207   HGB 14.6 05/01/2021 0436   HCT 43.0 05/01/2021 0436   PLT PLATELET CLUMPS NOTED ON SMEAR, UNABLE TO ESTIMATE 05/01/2021 0207   MCV 94.0 05/01/2021 0207   MCH 32.0 05/01/2021 0207   MCHC 34.0 05/01/2021 0207   RDW 13.5 05/01/2021 0207   LYMPHSABS 0.7 04/29/2021 2234   MONOABS 0.5 04/29/2021 2234   EOSABS 0.1 04/29/2021 2234   BASOSABS 0.0 04/29/2021 2234    CMP     Component Value Date/Time   NA 143 05/01/2021 0436   K 3.8 05/01/2021 0436   CL 111 05/01/2021 0207   CO2 23 05/01/2021 0207   GLUCOSE 137 (H) 05/01/2021 0207   BUN 24 (H) 05/01/2021 0207   CREATININE 0.98 05/01/2021 0207   CALCIUM 8.3 (L) 05/01/2021 0207   PROT 6.1 (L) 05/01/2021 0207   ALBUMIN 2.8 (L) 05/01/2021 0207   AST 19 05/01/2021 0207   ALT 24 05/01/2021 0207   ALKPHOS 37 (L) 05/01/2021 0207   BILITOT 0.4 05/01/2021 0207   GFRNONAA >60 05/01/2021 0207   CSF analysis CSF tube 1 RBC 91, WBC 585 with 99% segmented neutrophils, yellow CSF, xanthochromic.  Tube 3 RBC count 110, WBC 510, 99% segmented neutrophils. CSF  protein greater than 600 CSF glucose less than 20 General CSF appearance: Cloudy  Other infectious disease results: Crypto antigen negative COVID-19 negative HIV negative  Imaging I have reviewed images in epic and the results pertinent to this consultation are: MRI of the brain with leptomeningeal enhancement.  Assessment:  60 year old with sudden onset of altered mental status with a preceding headache, with CSF and MRI findings suggestive of bacterial meningitis currently on empiric antibiotic coverage.  Impression: Bacterial meningitis  Recommendations: Broad-spectrum antibiotic and steroid coverage for meningitis- vancomycin, ceftriaxone, ampicillin Ventilatory support per PCCM ID consultation appreciated  Routine EEG with right hemispheric cortical dysfunction.  No evidence of seizures.  Discussed with Kennieth Rad, NP, PCCM and H. Von Dohlen, Rph on the unit.  Updated his wife at bedside  -- Amie Portland, MD Neurologist [facility] Pager: [phone]  CRITICAL CARE ATTESTATION Performed by: Amie Portland, MD Total critical care time: 33 minutes Critical care time was exclusive of separately billable procedures and treating other patients and/or supervising APPs/Residents/Students Critical care was necessary to treat or prevent imminent or life-threatening deterioration due to bacterial meningitis. This patient  is critically ill and at significant risk for neurological worsening and/or death and care requires constant monitoring. Critical care was time spent personally by me on the following activities: development of treatment plan with patient and/or surrogate as well as nursing, discussions with consultants, evaluation of patient's response to treatment, examination of patient, obtaining history from patient or surrogate, ordering and performing treatments and interventions, ordering and review of laboratory studies, ordering and review of radiographic  studies, pulse oximetry, re-evaluation of patient's condition, participation in multidisciplinary rounds and medical decision making of high complexity in the care of this patient.

## 2021-05-01 NOTE — Progress Notes (Signed)
[facility] Progress Note Patient Name: GARNIE BORCHARDT DOB: 02-14-1961 MRN: [id number]   Date of Service  05/01/2021  HPI/Events of Note  Agitation - Nursing request to renew 4 point restraints.   eICU Interventions  Will renew order for 4 point soft restraints X 10 hours.      Intervention Category Major Interventions: Delirium, psychosis, severe agitation - evaluation and management  Jailee Jaquez Eugene 05/01/2021, 11:39 PM

## 2021-05-01 NOTE — Progress Notes (Signed)
NAME:  Hayden Mcbride, MRN:  [id number], DOB:  10-24-1961, LOS: 1 ADMISSION DATE:  04/29/2021, CONSULTATION DATE:  05/01/21 REFERRING MD:  EDP, CHIEF COMPLAINT:  AMS   Brief summary:  60 year old male with sudden onset of headache and hearing loss  f/b AMS.  Recent return from 2 week trip to [place] early May.  Early CSF and MRI findings suggestive for bacterial meningitis.  ID consulted.    Pertinent  Medical History   has a past medical history of Allergy, Dry eyes, GERD (gastroesophageal reflux disease), Glaucoma, adenomatous colonic polyps (07/08/2006), Hypertension, Post-operative nausea and vomiting, Sleep apnea, and Thyroid disease.   Significant Hospital Events: Including procedures, antibiotic start and stop dates in addition to other pertinent events   . 5/17 presented to ED, intubated for agitation and airway protection . 5/18 PCCM consulted for admission, lumbar puncture completed, ID consulted.  EEG w/right hemispheric cortical dysfunction, no evidence of seizures.  Started on decadron; precedex off, remain on fentanyl gtt   Tubes/lines ETT 5/17 >> Foley 5/17 >> 5/19  Antibiotics: Ampicillin 5/17>> 5/19 Ceftriaxone 5/17 (2gm q 12hr) >> Vancomycin 5/17- Acyclovir 5/18  Micro: 5/18 CSF culture and gram stain>> S. Pneumoniae >> BICD pending 5/18 CSF fungal cx>>         CSF HSV >> 5/18 BCx >>   Interim History / Subjective:  tmax 99.3 CSF c/w with pneumococcal meningitis, BICD pending  Doing well on PSV trial 8/5 since 0745 Remains on fentanyl gtt, weaning, but limited given frequent episodes of agitation precedex stopped due to heart rate 5/18 UOP 1.4L /24hr +4.1L/ net +4.6 L  Objective   Blood pressure 123/74, pulse 66, temperature 99.2 F (37.3 C), temperature source Axillary, resp. rate 15, height 5\' 11"  (1.803 m), weight 116.8 kg, SpO2 94 %.    Vent Mode: PSV;CPAP FiO2 (%):  [40 %] 40 % Set Rate:  [16 bmp] 16 bmp Vt Set:  [600 mL] 600 mL PEEP:  [5 cmH20]  5 cmH20 Pressure Support:  [8 cmH20] 8 cmH20 Plateau Pressure:  [18 cmH20] 18 cmH20   Intake/Output Summary (Last 24 hours) at 05/01/2021 0959 Last data filed at 05/01/2021 0900 Gross per 24 hour  Intake 5597.38 ml  Output 1225 ml  Net 4372.38 ml   Filed Weights   04/29/21 2317  Weight: 116.8 kg   General: Adult male in NAD on MV, intermittently agitated with stimulation HEENT: MM pink/moist, ETT/ OGT, pupils 3/reactive, anicteric  Neuro:  Initially opened eyes to verbal and questionably wiggled toes but then became severely agitated, trying to sit up and not following any commands, still requiring soft restraints for safety CV: rr, NSR PULM:  Non labored, coarse, doing well on PSV 8/5 GI: obese, +bs, NT, foley Extremities: warm/dry, no LE edema  Skin: no rashes   Labs/imaging that I havepersonally reviewed  (right click and "Reselect all SmartList Selections" daily)   Micro data CBC, BMET Glucose trend  Resolved Hospital Problem list     Assessment & Plan:   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  - per Neurology and ID - continue ceftriaxone and vanc, awaiting BICD - acyclovir d/c'd 5/18 on ID recs, very unlikely, HSV pending - ok with ID pharm to d/c ampicillin, started for possible listeria coverage given recent trip out of the country - continue decadron - frequent neuro checks  - follow BC and CSF cx  Bradycardia - resolved after stopping precedex 5/18, continue to monitor  Acute hypoxic respiratory failure -  Continue MV support, 4-8cc/kg IBW with goal Pplat <30 and DP<15  -VAP prevention protocol/ PPI -PAD protocol for sedation> fentanyl gtt, wean as tolerated, did not tolerate precedex 2/2 bradycardia  -wean FiO2 as able for SpO2 >92%  -daily SAT & SBT, doing well on PSV today, however mental status is a barrier to extubation   Lactic acidosis - lactate 3.1 >> 2.8 - stop MIVF, on TF   Electrolyte imbalance -hypocalcemia (corrected calcium  8.5) -hypomagnesemia (Mg 1.6)- resolved, 2.5 today  -trend CMP  Erythrocytosis- resolved Thrombocytopenia Leukocytosis  - trend CBC  History of hypertension - continue to hold antihypertensive  Best practice (right click and "Reselect all SmartList Selections" daily)  Diet:  NPO; TF  Pain/Anxiety/Delirium protocol (if indicated): Yes (RASS goal 0) VAP protocol (if indicated): Yes DVT prophylaxis: Contraindicated LP; will add back 5/19 GI prophylaxis: PPI Glucose control:  SSI Yes Central venous access:  N/A Arterial line:  N/A Foley:  Yes, and it is no longer needed; d/c today, bladder scan prn Mobility:  bed rest  PT consulted: N/A Last date of multidisciplinary goals of care discussion [pending] Code Status:  full code Disposition: ICU  Wife updated at bedside.    Labs   CBC: Recent Labs  Lab 04/29/21 2234 04/29/21 2240 04/29/21 2358 04/30/21 0735 05/01/21 0207 05/01/21 0436  WBC 18.3*  --   --  18.9* 19.2*  --   NEUTROABS 16.9*  --   --   --   --   --   HGB 18.7* 18.7* 18.4* 17.4* 16.1 14.6  HCT 55.4* 55.0* 54.0* 51.5 47.3 43.0  MCV 94.4  --   --  93.5 94.0  --   PLT 142*  --   --  145* PLATELET CLUMPS NOTED ON SMEAR, UNABLE TO ESTIMATE  --     Basic Metabolic Panel: Recent Labs  Lab 04/29/21 2234 04/29/21 2240 04/29/21 2358 04/30/21 0735 05/01/21 0207 05/01/21 0436  NA 139 142 141 138 141 143  K 3.2* 3.2* 2.8* 3.9 3.9 3.8  CL 105 104  --  106 111  --   CO2 21*  --   --  23 23  --   GLUCOSE 129* 125*  --  127* 137*  --   BUN 13 17  --  18 24*  --   CREATININE 1.05 0.80  --  1.13 0.98  --   CALCIUM 9.0  --   --  8.5* 8.3*  --   MG  --   --   --  1.6* 2.5*  --   PHOS  --   --   --  3.3 2.8  --    GFR: Estimated Creatinine Clearance: 102.9 mL/min (by C-G formula based on SCr of 0.98 mg/dL). Recent Labs  Lab 04/29/21 2234 04/29/21 2354 04/30/21 0735 04/30/21 1213 05/01/21 0207  WBC 18.3*  --  18.9*  --  19.2*  LATICACIDVEN  --  3.1*  --   2.8*  --     Liver Function Tests: Recent Labs  Lab 04/29/21 2234 05/01/21 0207  AST 32 19  ALT 27 24  ALKPHOS 50 37*  BILITOT 2.6* 0.4  PROT 7.0 6.1*  ALBUMIN 4.0 2.8*   No results for input(s): LIPASE, AMYLASE in the last 168 hours. No results for input(s): AMMONIA in the last 168 hours.  ABG    Component Value Date/Time   PHART 7.374 05/01/2021 0436   PCO2ART 43.0 05/01/2021 0436   PO2ART 105 05/01/2021 0436  HCO3 25.1 05/01/2021 0436   TCO2 26 05/01/2021 0436   ACIDBASEDEF 2.0 04/29/2021 2358   O2SAT 98.0 05/01/2021 0436     Coagulation Profile: Recent Labs  Lab 04/30/21 0027  INR 1.4*    Cardiac Enzymes: No results for input(s): CKTOTAL, CKMB, CKMBINDEX, TROPONINI in the last 168 hours.  HbA1C: Hgb A1c MFr Bld  Date/Time Value Ref Range Status  04/30/2021 07:39 AM 5.4 4.8 - 5.6 % Final    Comment:    (NOTE) Pre diabetes:          5.7%-6.4%  Diabetes:              >6.4%  Glycemic control for   <7.0% adults with diabetes     CBG: Recent Labs  Lab 04/30/21 1547 04/30/21 2035 04/30/21 2333 05/01/21 0324 05/01/21 0801  GLUCAP 134* 133* 144* 149* 154*     CCT: 30 mins    Kennieth Rad, ACNP [facility] 05/01/2021, 9:59 AM

## 2021-05-01 NOTE — Progress Notes (Signed)
Pt received from 4N, patient stable on ventilator.  4 point restraints reinforced due to patients agitation

## 2021-05-02 ENCOUNTER — Inpatient Hospital Stay (HOSPITAL_COMMUNITY): Payer: Commercial Managed Care - PPO

## 2021-05-02 ENCOUNTER — Inpatient Hospital Stay: Payer: Self-pay

## 2021-05-02 DIAGNOSIS — G001 Pneumococcal meningitis: Secondary | ICD-10-CM

## 2021-05-02 DIAGNOSIS — J9600 Acute respiratory failure, unspecified whether with hypoxia or hypercapnia: Secondary | ICD-10-CM

## 2021-05-02 DIAGNOSIS — R410 Disorientation, unspecified: Secondary | ICD-10-CM | POA: Diagnosis not present

## 2021-05-02 DIAGNOSIS — G009 Bacterial meningitis, unspecified: Secondary | ICD-10-CM | POA: Diagnosis not present

## 2021-05-02 LAB — CSF CULTURE W GRAM STAIN: Special Requests: NORMAL

## 2021-05-02 LAB — BASIC METABOLIC PANEL
Anion gap: 6 (ref 5–15)
BUN: 30 mg/dL — ABNORMAL HIGH (ref 8–23)
CO2: 25 mmol/L (ref 22–32)
Calcium: 8 mg/dL — ABNORMAL LOW (ref 8.9–10.3)
Chloride: 110 mmol/L (ref 98–111)
Creatinine, Ser: 0.92 mg/dL (ref 0.61–1.24)
GFR, Estimated: >60 mL/min (ref >60)
Glucose, Bld: 162 mg/dL — ABNORMAL HIGH (ref 70–99)
Potassium: 4.7 mmol/L (ref 3.5–5.1)
Sodium: 141 mmol/L (ref 135–145)

## 2021-05-02 LAB — CBC
HCT: 46.6 % (ref 39.0–52.0)
Hemoglobin: 15.2 g/dL (ref 13.0–17.0)
MCH: 31.6 pg (ref 26.0–34.0)
MCHC: 32.6 g/dL (ref 30.0–36.0)
MCV: 96.9 fL (ref 80.0–100.0)
Platelets: 107 10*3/uL — ABNORMAL LOW (ref 150–400)
RBC: 4.81 MIL/uL (ref 4.22–5.81)
RDW: 13.6 % (ref 11.5–15.5)
WBC: 17.1 10*3/uL — ABNORMAL HIGH (ref 4.0–10.5)
nRBC: 0 % (ref 0.0–0.2)

## 2021-05-02 LAB — GLUCOSE, CAPILLARY
Glucose-Capillary: 142 mg/dL — ABNORMAL HIGH (ref 70–99)
Glucose-Capillary: 150 mg/dL — ABNORMAL HIGH (ref 70–99)
Glucose-Capillary: 150 mg/dL — ABNORMAL HIGH (ref 70–99)
Glucose-Capillary: 150 mg/dL — ABNORMAL HIGH (ref 70–99)
Glucose-Capillary: 150 mg/dL — ABNORMAL HIGH (ref 70–99)
Glucose-Capillary: 180 mg/dL — ABNORMAL HIGH (ref 70–99)

## 2021-05-02 IMAGING — DX DG CHEST 1V PORT
1 series · 1 of 1 positions shown · non-contrast
Comparison: [DATE].

CLINICAL DATA: Central line placement.

EXAM:
PORTABLE CHEST 1 VIEW

[chest]
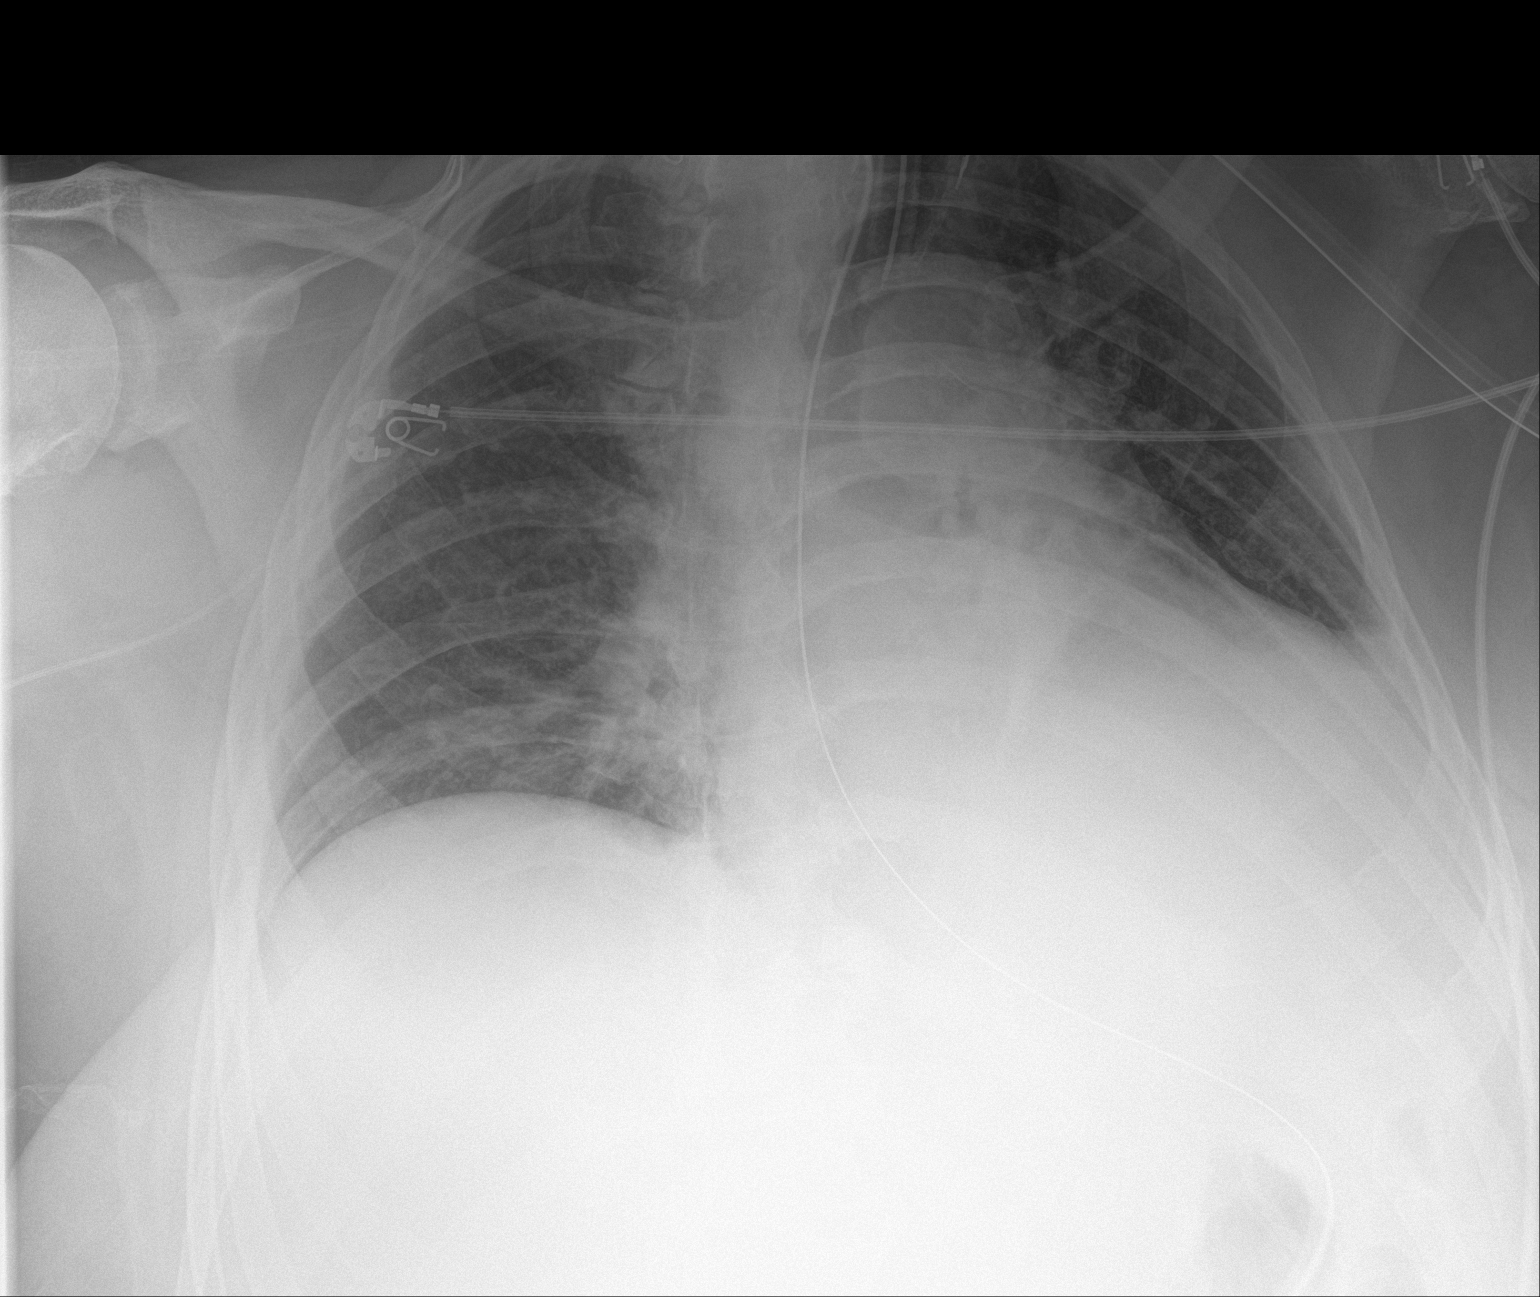

[1 of 1 positions shown; findings below may reference images not displayed]

FINDINGS: Stable cardiomediastinal silhouette. Endotracheal and nasogastric
tubes are unchanged in position. No pneumothorax is noted.
Right-sided PICC line is noted, although its distal tip cannot be
visualized due to overlying cardiac shadow. Bibasilar atelectasis is
noted. Small left pleural effusion may be present. Bony thorax is
unremarkable.
IMPRESSION: Interval placement of right-sided PICC line, although distal tip
cannot be visualized due to overlying cardiac shadow. Stable support
apparatus. Bibasilar subsegmental atelectasis.

## 2021-05-02 IMAGING — DX DG CHEST 1V PORT
1 series · 2 of 2 positions shown · non-contrast
Comparison: Same day.

CLINICAL DATA: PICC placement.

EXAM:
PORTABLE CHEST 1 VIEW

[Series 1: chest · 0.14mm/px · 2 of 2 slices shown]
[im 1/2]
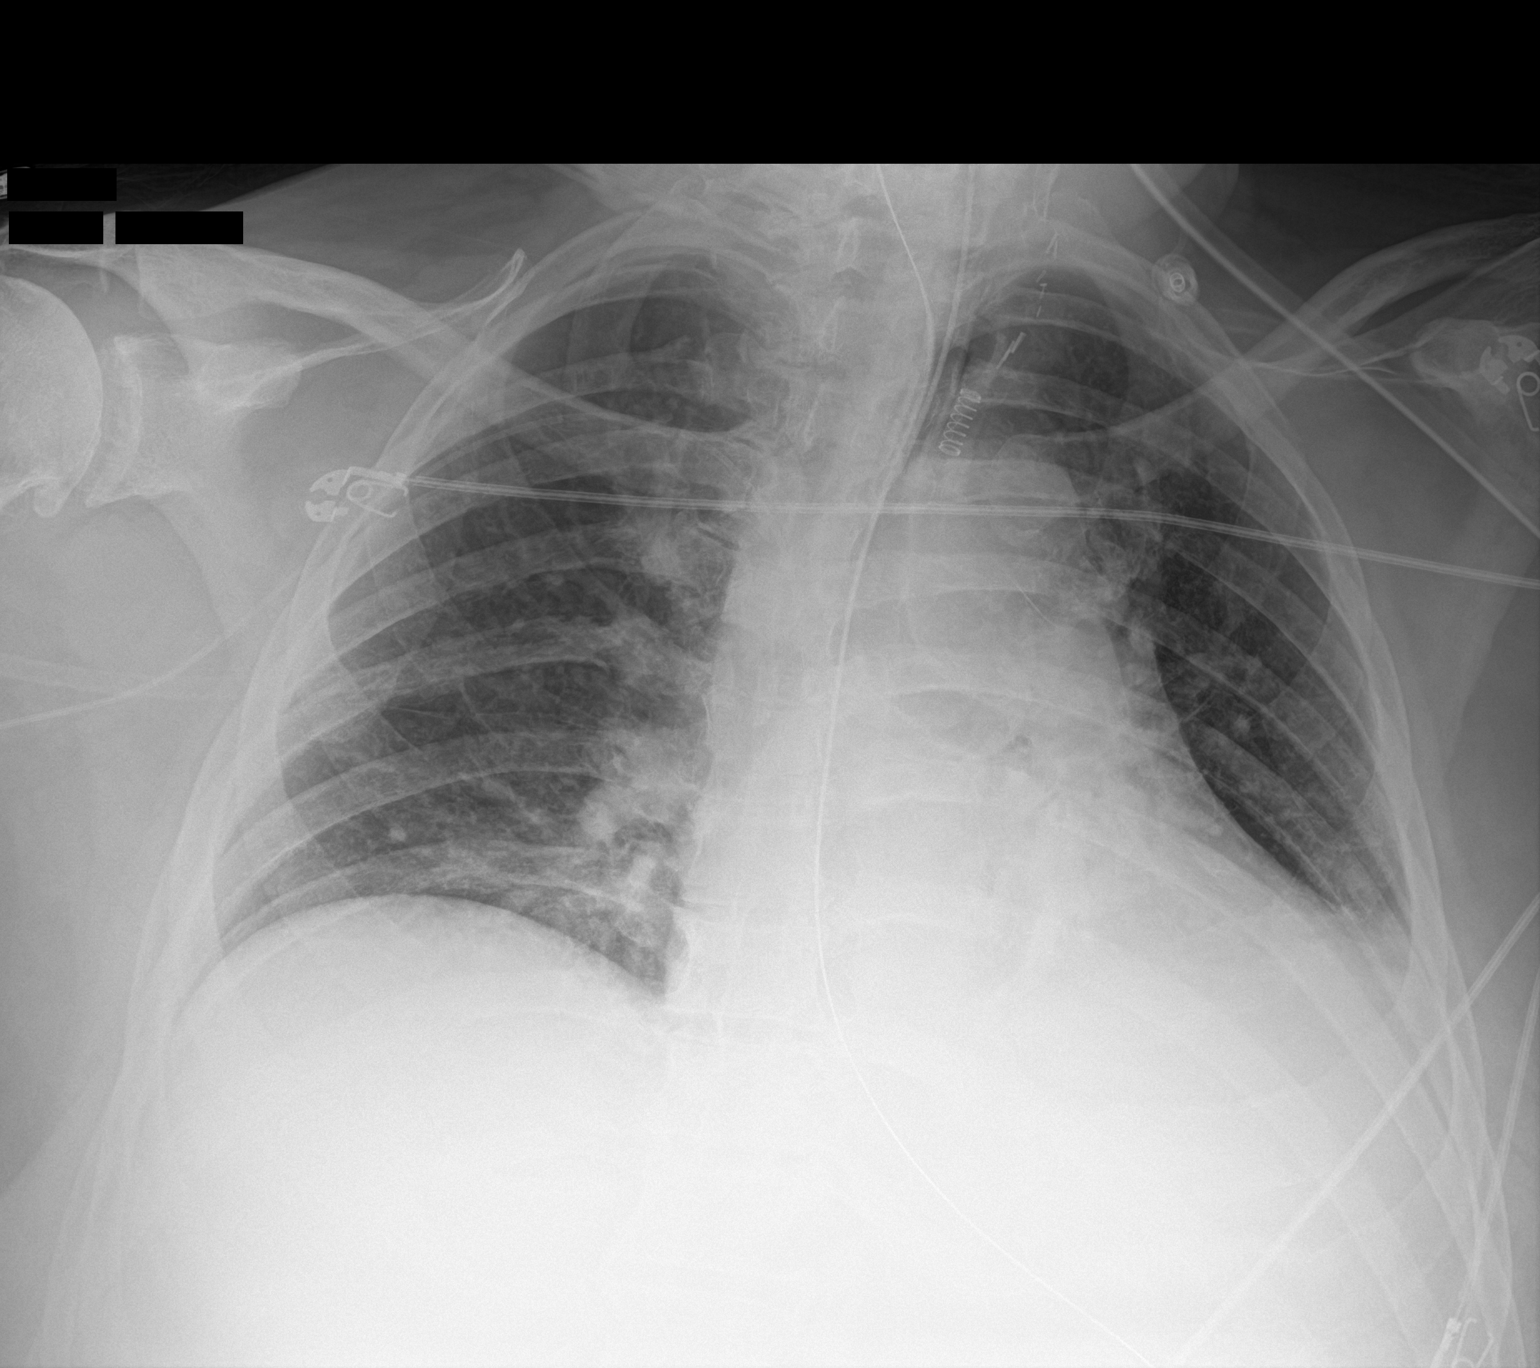
[im 2/2]
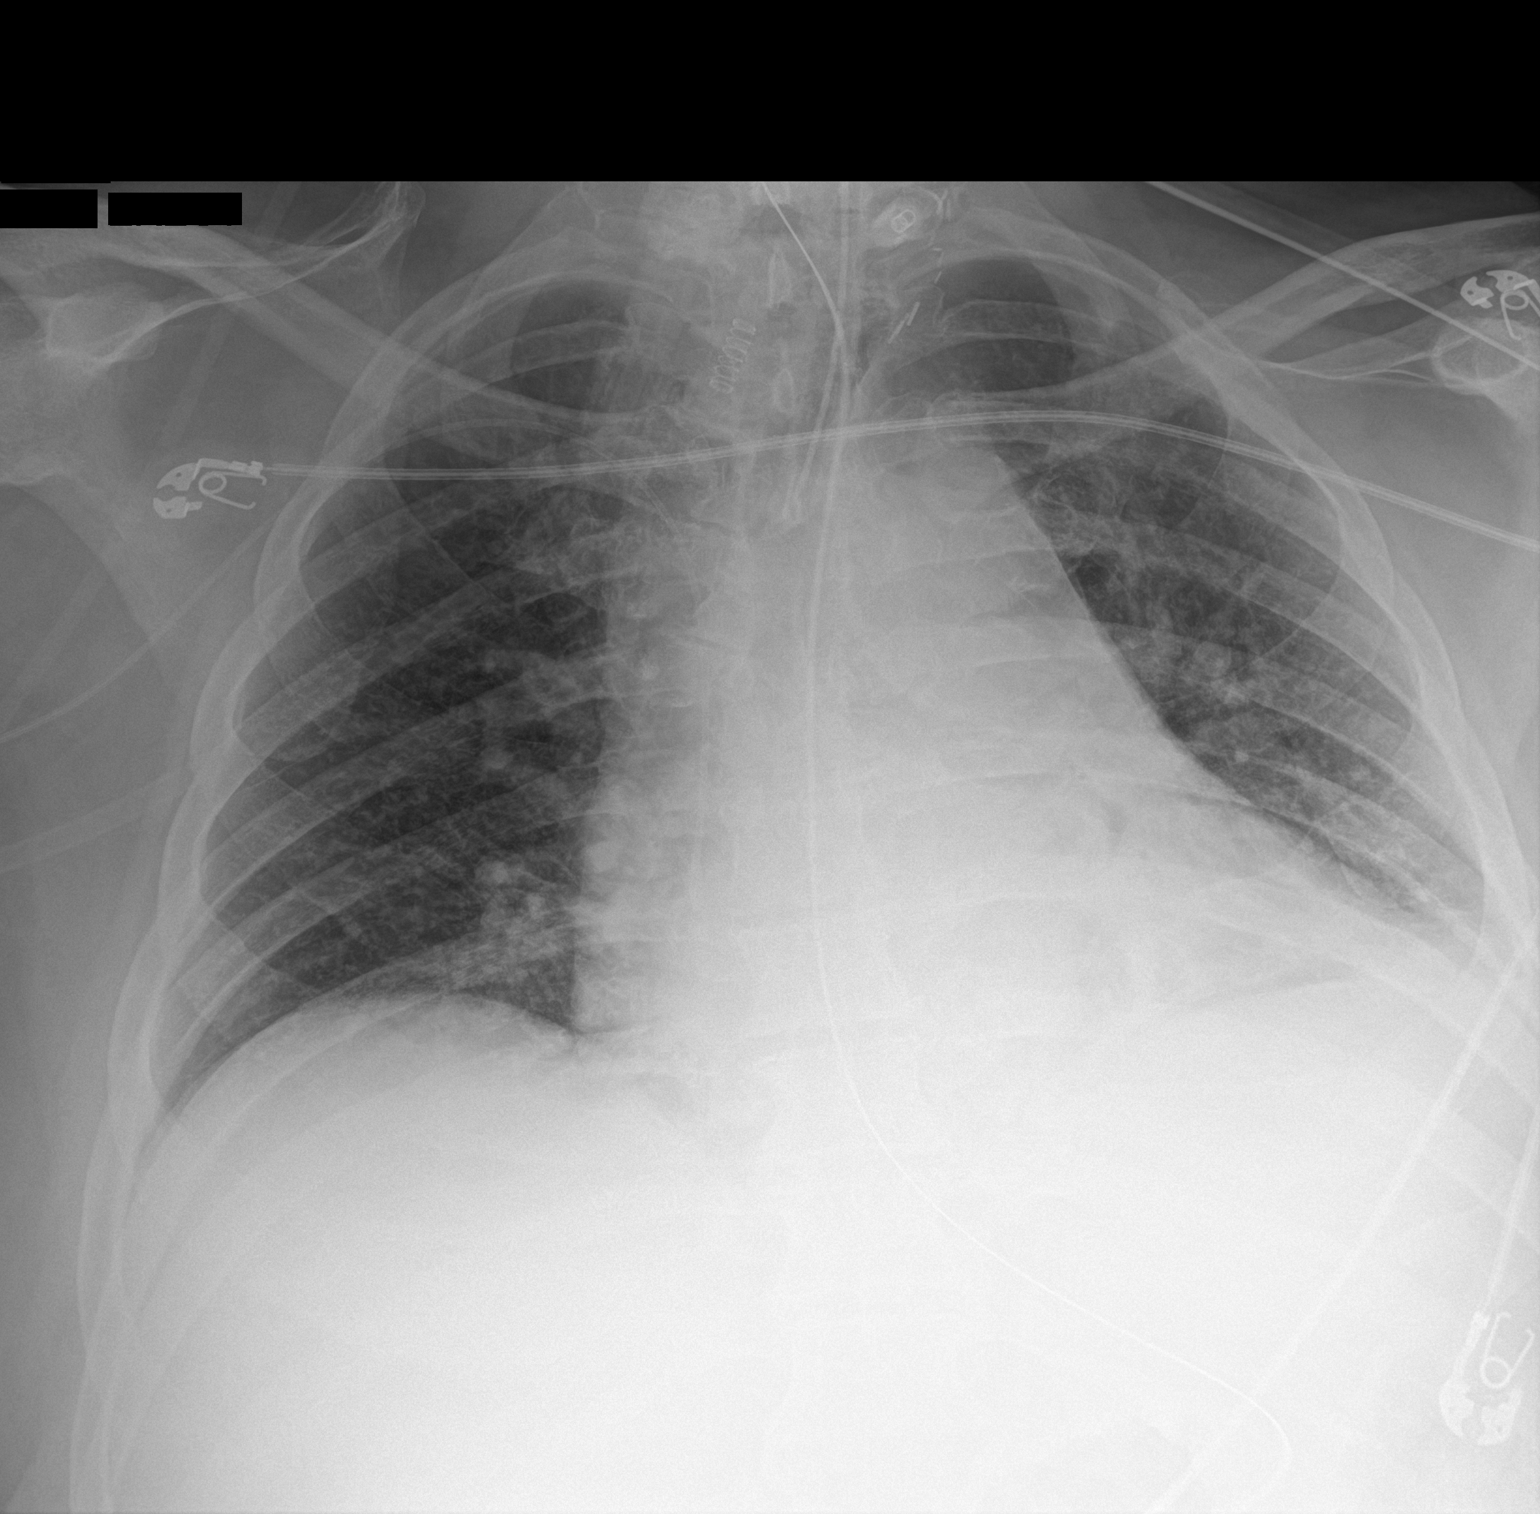

[2 of 2 positions shown; findings below may reference images not displayed]

FINDINGS: Stable cardiomediastinal silhouette. Endotracheal nasogastric tubes
are unchanged in position. Bibasilar atelectasis is noted with
probable small left pleural effusion. No pneumothorax is noted.
Right-sided PICC line is noted with tip in expected position of the
SVC. Bony thorax is unremarkable.
IMPRESSION: Right-sided PICC line with distal tip in expected position of the
SVC. Stable support apparatus. Stable bibasilar atelectasis is noted
with probable small left pleural effusion.

## 2021-05-02 IMAGING — DX DG ABDOMEN 1V
1 series · 1 of 1 positions shown · non-contrast
Comparison: None.

CLINICAL DATA: Gastric catheter placement

EXAM:
ABDOMEN - 1 VIEW

[abdomen kub]
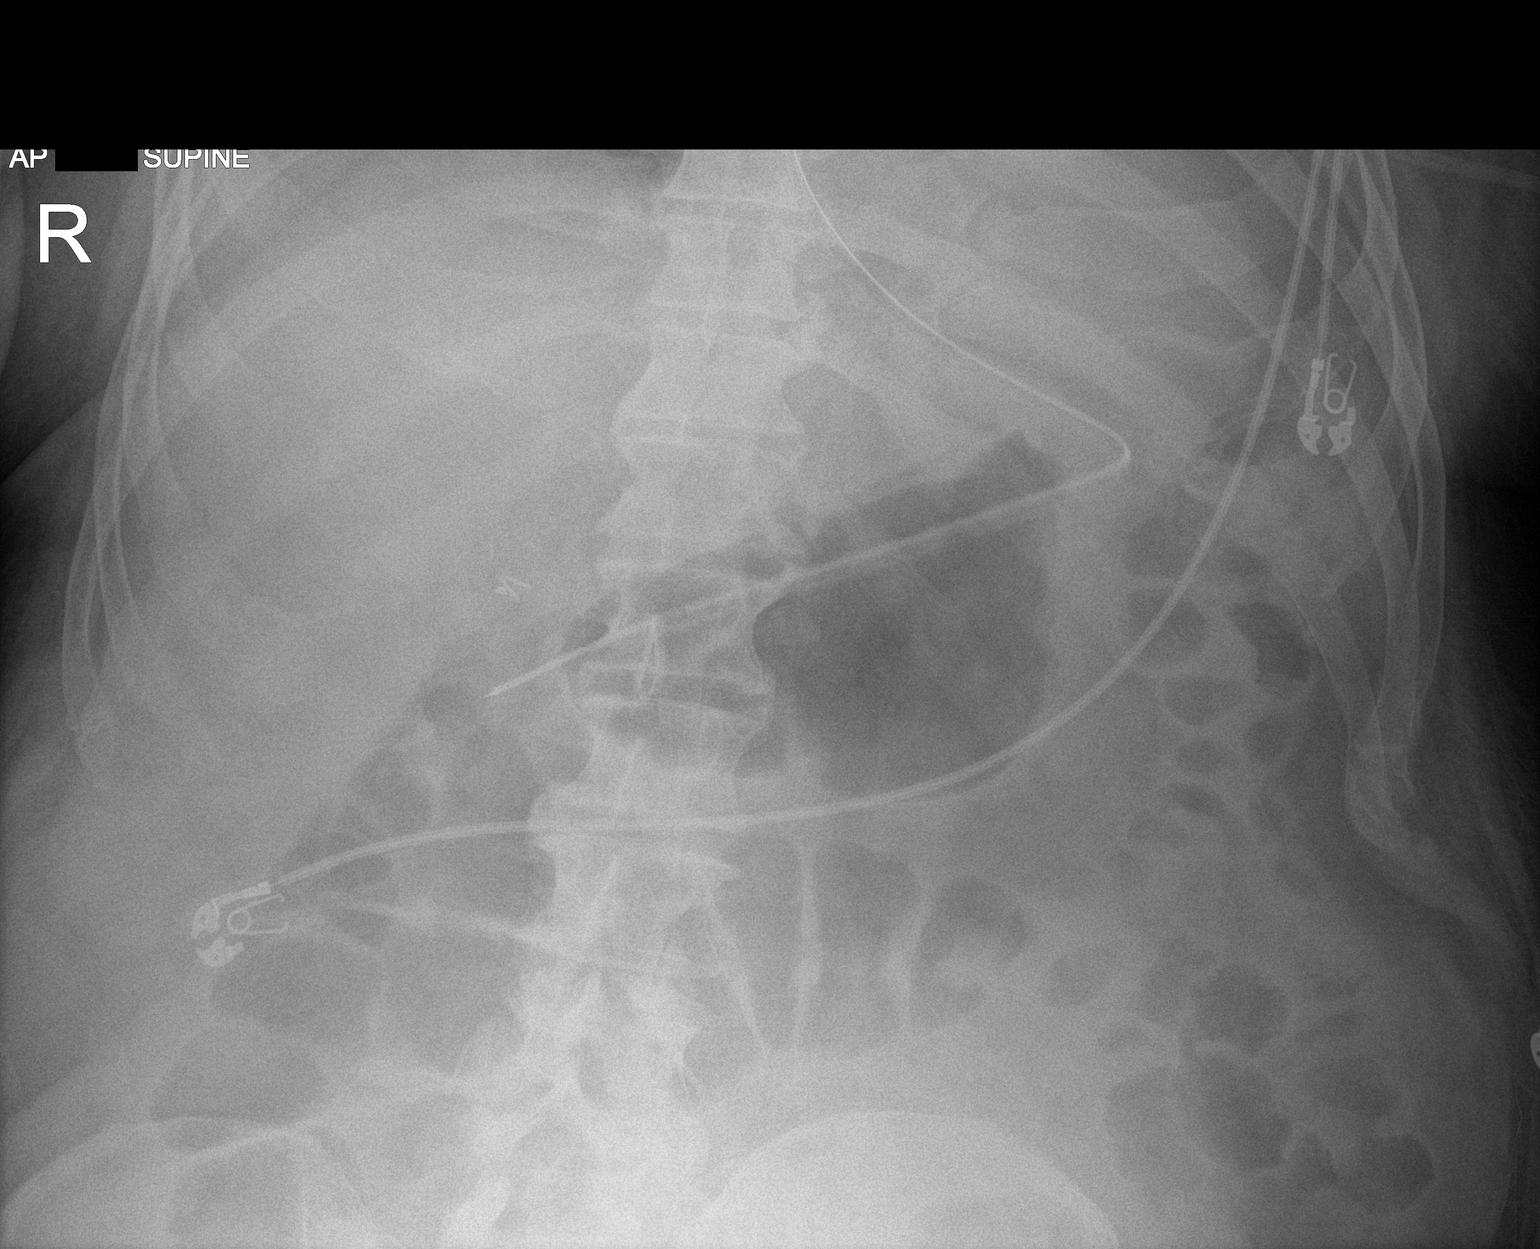

[1 of 1 positions shown; findings below may reference images not displayed]

FINDINGS: Gastric catheter is noted within the stomach. Scattered large and
small bowel gas is noted. No free air is noted. Degenerative change
of the lumbar spine is seen.
IMPRESSION: Gastric catheter within the stomach. Patient's known PICC line is
not visualized on this image due to the image field of view.

## 2021-05-02 MED ORDER — ACETAMINOPHEN 160 MG/5ML PO SOLN
650.0000 mg | Freq: Four times a day (QID) | ORAL | Status: DC | PRN
  Administered 2021-05-02 – 2021-05-09 (×8): 650 mg
  Filled 2021-05-02 (×8): qty 20.3

## 2021-05-02 NOTE — Progress Notes (Addendum)
Neurology Progress Note   S:// Seen and examined CSF results consistent with bacterial meningitis, CSF culture growing gram-positive cocci with predominantly mononuclear WBCs. MRI brain also reviewed personally-consistent with meningitis. Appreciate ID consultation- .  O:// Current vital signs: BP (!) 153/85   Pulse (!) 106   Temp 99 F (37.2 C) (Axillary)   Resp 14   Ht 5' 10.98" (1.803 m)   Wt 114.5 kg   SpO2 96%   BMI 35.22 kg/m  Vital signs in last 24 hours: Temp:  [98.6 F (37 C)-100 F (37.8 C)] 99 F (37.2 C) (05/20 0722) Pulse Rate:  [56-106] 106 (05/20 0800) Resp:  [14-25] 14 (05/20 0800) BP: (124-171)/(70-91) 153/85 (05/20 0800) SpO2:  [91 %-99 %] 96 % (05/20 0800) FiO2 (%):  [40 %] 40 % (05/20 0740) Weight:  [114.5 kg] 114.5 kg (05/20 0400) General: Sedated intubated HEENT: Normocephalic atraumatic, moderate degree of neck stiffness while trying to flex his neck, much more supple on cytocide movement. CVS: Regular rate rhythm Abdomen nondistended nontender Respiratory: Vented Neurological exam Opens eyes to voice, attempts to track the examiner albeit very weakly. Gets extremely agitated and starts moving all 4 extremities Did not wiggle toes to command today Nonverbal Cranial nerves: Disconjugate gaze, equal pupils bilaterally, unable to properly assess extraocular movements, facial symmetry also difficult to ascertain due to ET tube. Motor examination: Mild grimacing on noxious stimulation to both upper extremities.  Sensory exam: As above Coordination cannot be assessed  Somewhat more sedated exam compared to yesterday  Medications  Current Facility-Administered Medications:  .  0.9 %  sodium chloride infusion, , Intravenous, PRN, Kipp Brood, MD, Paused at 05/02/21 0509 .  acetaminophen (TYLENOL) 160 MG/5ML solution 650 mg, 650 mg, Per Tube, Q6H PRN, Anders Simmonds, MD, 650 mg at 05/02/21 0137 .  cefTRIAXone (ROCEPHIN) 2 g in sodium chloride  0.9 % 100 mL IVPB, 2 g, Intravenous, Q12H, Gleason, Otilio Carpen, PA-C, Last Rate: 200 mL/hr at 05/02/21 0958, 2 g at 05/02/21 0958 .  chlorhexidine gluconate (MEDLINE KIT) (PERIDEX) 0.12 % solution 15 mL, 15 mL, Mouth Rinse, BID, Agarwala, Ravi, MD, 15 mL at 05/02/21 0739 .  Chlorhexidine Gluconate Cloth 2 % PADS 6 each, 6 each, Topical, Daily, Kipp Brood, MD, 6 each at 05/02/21 0200 .  dexamethasone (DECADRON) injection 10 mg, 10 mg, Intravenous, Q6H, Amie Portland, MD, 10 mg at 05/02/21 0959 .  docusate (COLACE) 50 MG/5ML liquid 100 mg, 100 mg, Per Tube, BID PRN, Jacky Kindle, MD .  feeding supplement (PROSource TF) liquid 45 mL, 45 mL, Per Tube, TID, Jacky Kindle, MD, 45 mL at 05/02/21 0949 .  feeding supplement (VITAL AF 1.2 CAL) liquid 1,000 mL, 1,000 mL, Per Tube, Continuous, Chand, Currie Paris, MD, Last Rate: 65 mL/hr at 05/01/21 2000, Rate Change at 05/01/21 2000 .  fentaNYL (SUBLIMAZE) bolus via infusion 50-100 mcg, 50-100 mcg, Intravenous, Q15 min PRN, Gleason, Otilio Carpen, PA-C, 25 mcg at 05/01/21 2100 .  fentaNYL (SUBLIMAZE) injection 50 mcg, 50 mcg, Intravenous, Once, Gleason, Otilio Carpen, PA-C .  fentaNYL 2525mg in NS 2558m(1038mml) infusion-PREMIX, 50-200 mcg/hr, Intravenous, Continuous, Gleason, LauOtilio CarpenA-C, Last Rate: 5 mL/hr at 05/02/21 0600, 50 mcg/hr at 05/02/21 0600 .  insulin aspart (novoLOG) injection 0-15 Units, 0-15 Units, Subcutaneous, Q4H, Gleason, LauOtilio CarpenA-C, 2 Units at 05/02/21 0737062 LORazepam (ATIVAN) injection 2 mg, 2 mg, Intravenous, Q6H PRN, ChaJacky KindleD, 2 mg at 05/02/21 0837 .  MEDLINE mouth rinse, 15 mL, Mouth Rinse,  10 times per day, Agarwala, Ravi, MD, 15 mL at 05/02/21 0600 .  mupirocin ointment (BACTROBAN) 2 % 1 application, 1 application, Nasal, BID, Kipp Brood, MD, 1 application at 32/95/18 2044 .  pantoprazole sodium (PROTONIX) 40 mg/20 mL oral suspension 40 mg, 40 mg, Per Tube, Daily, Jacky Kindle, MD, 40 mg at 05/02/21 0951 .  polyethylene glycol  (MIRALAX / GLYCOLAX) packet 17 g, 17 g, Per Tube, Daily PRN, Jennelle Human B, NP .  QUEtiapine (SEROQUEL) tablet 50 mg, 50 mg, Per Tube, BID, Jennelle Human B, NP, 50 mg at 05/02/21 0950 Labs CBC    Component Value Date/Time   WBC 17.1 (H) 05/02/2021 0616   RBC 4.81 05/02/2021 0616   HGB 15.2 05/02/2021 0616   HCT 46.6 05/02/2021 0616   PLT 107 (L) 05/02/2021 0616   MCV 96.9 05/02/2021 0616   MCH 31.6 05/02/2021 0616   MCHC 32.6 05/02/2021 0616   RDW 13.6 05/02/2021 0616   LYMPHSABS 0.7 04/29/2021 2234   MONOABS 0.5 04/29/2021 2234   EOSABS 0.1 04/29/2021 2234   BASOSABS 0.0 04/29/2021 2234    CMP     Component Value Date/Time   NA 141 05/02/2021 0616   K 4.7 05/02/2021 0616   CL 110 05/02/2021 0616   CO2 25 05/02/2021 0616   GLUCOSE 162 (H) 05/02/2021 0616   BUN 30 (H) 05/02/2021 0616   CREATININE 0.92 05/02/2021 0616   CALCIUM 8.0 (L) 05/02/2021 0616   PROT 6.1 (L) 05/01/2021 0207   ALBUMIN 2.8 (L) 05/01/2021 0207   AST 19 05/01/2021 0207   ALT 24 05/01/2021 0207   ALKPHOS 37 (L) 05/01/2021 0207   BILITOT 0.4 05/01/2021 0207   GFRNONAA >60 05/02/2021 0616   CSF analysis CSF tube 1 RBC 91, WBC 585 with 99% segmented neutrophils, yellow CSF, xanthochromic.  Tube 3 RBC count 110, WBC 510, 99% segmented neutrophils. CSF protein greater than 600 CSF glucose less than 20 General CSF appearance: Cloudy  Other infectious disease results: Crypto antigen negative COVID-19 negative HIV negative  Imaging I have reviewed images in epic and the results pertinent to this consultation are: MRI of the brain with leptomeningeal enhancement.  Assessment:  60 year old with sudden onset of altered mental status with a preceding headache, with CSF and MRI findings suggestive of bacterial meningitis currently on empiric antibiotic coverage.  Impression: Bacterial meningitis  Recommendations: Abx per ID C/W Steroids for four days total Ventilatory support per PCCM ID  consultation appreciated  Routine EEG with right hemispheric cortical dysfunction.  No evidence of seizures.  D/W Dr Lamonte Sakai  Updated his wife at bedside  -- Amie Portland, MD Neurologist [facility] Pager: [phone]

## 2021-05-02 NOTE — Progress Notes (Signed)
[place] Progress Note Patient Name: Hayden Mcbride DOB: 1961-05-05 MRN: [id number]   Date of Service  05/02/2021  HPI/Events of Note  Fever to 99.7 F - Nursing request for Tylenol. AST and ALT both normal.   eICU Interventions  Plan: 1. Tylenol liquid 650 mg per tube Q 5 hours PRN Temp > 100.5 F.     Intervention Category Major Interventions: Other:  Lysle Dingwall 05/02/2021, 12:57 AM

## 2021-05-02 NOTE — Progress Notes (Addendum)
[facility]    Date of Admission:  04/29/2021   Total days of antibiotics 4           ID: ABHI MOCCIA is a 60 y.o. male with strep pneumo meningitis Active Problems:   AMS (altered mental status)    Subjective: Afebrile, still has AMS, combative due to still being intubated  Medications:  . chlorhexidine gluconate (MEDLINE KIT)  15 mL Mouth Rinse BID  . Chlorhexidine Gluconate Cloth  6 each Topical Daily  . dexamethasone (DECADRON) injection  10 mg Intravenous Q6H  . feeding supplement (PROSource TF)  45 mL Per Tube TID  . fentaNYL (SUBLIMAZE) injection  50 mcg Intravenous Once  . insulin aspart  0-15 Units Subcutaneous Q4H  . mouth rinse  15 mL Mouth Rinse 10 times per day  . mupirocin ointment  1 application Nasal BID  . pantoprazole sodium  40 mg Per Tube Daily  . QUEtiapine  50 mg Per Tube BID    Objective: Vital signs in last 24 hours: Temp:  [99 F (37.2 C)-100 F (37.8 C)] 99.1 F (37.3 C) (05/20 1135) Pulse Rate:  [56-106] 98 (05/20 1144) Resp:  [14-21] 20 (05/20 1144) BP: (124-171)/(70-91) 153/82 (05/20 1144) SpO2:  [91 %-99 %] 96 % (05/20 1144) FiO2 (%):  [40 %] 40 % (05/20 1144) Weight:  [114.5 kg] 114.5 kg (05/20 0400) Physical Exam  Constitutional: remains intubated. He appears well-developed and well-nourished. No distress.  HENT:  Mouth/Throat: Oropharynx is clear and moist. No oropharyngeal exudate.  Cardiovascular: Normal rate, regular rhythm and normal heart sounds. Exam reveals no gallop and no friction rub.  No murmur heard.  Pulmonary/Chest: Effort normal and breath sounds normal. No respiratory distress. He has no wheezes.  Abdominal: Soft. Bowel sounds are normal. He exhibits no distension. There is no tenderness.  Lymphadenopathy:  He has no cervical adenopathy.  Ext: swelling to dorsum of hand bilaterally and left forearm Skin: Skin is warm and dry. No rash noted. No erythema.  Psychiatric: He has a normal  mood and affect. His behavior is normal.    Lab Results Recent Labs    05/01/21 0207 05/01/21 0436 05/02/21 0616  WBC 19.2*  --  17.1*  HGB 16.1 14.6 15.2  HCT 47.3 43.0 46.6  NA 141 143 141  K 3.9 3.8 4.7  CL 111  --  110  CO2 23  --  25  BUN 24*  --  30*  CREATININE 0.98  --  0.92   Liver Panel Recent Labs    04/29/21 2234 05/01/21 0207  PROT 7.0 6.1*  ALBUMIN 4.0 2.8*  AST 32 19  ALT 27 24  ALKPHOS 50 37*  BILITOT 2.6* 0.4    Microbiology: Streptococcus pneumoniae      MIC    CEFTRIAXONE (meningitis) <=0.12 SENS... Sensitive    CEFTRIAXONE (non-meningitis) <=0.12 SENS... Sensitive    ERYTHROMYCIN <=0.12 SENS... Sensitive    LEVOFLOXACIN 0.5 SENSITIVE  Sensitive    PENICILLIN (meningitis) <=0.06 SENS... Sensitive    PENICILLIN (non-meningitis) <=0.06 SENS... Sensitive    PENICILLIN (oral) <=0.06 SENS... Sensitive    PENO - penicillin <=0.06      VANCOMYCIN 0.5 SENSITIVE  Sensitive     Studies/Results: DG Abd Portable 1V  Result Date: 04/30/2021 CLINICAL DATA:  Check gastric catheter placement EXAM: PORTABLE ABDOMEN - 1 VIEW COMPARISON:  None FINDINGS: Gastric catheter is noted in the distal stomach in satisfactory position. Scattered mildly dilated loops  of small bowel are noted. IMPRESSION: Gastric catheter as described. Findings consistent with mild small bowel dilatation. Electronically Signed   By: Inez Catalina M.D.   On: 04/30/2021 18:13   [place] EKG SITE RITE  Result Date: 05/02/2021 If Site Rite image not attached, placement could not be confirmed due to current cardiac rhythm.    Assessment/Plan: 60yo M with strep pneumoniae meningitis - remains to have encephalopathy, on vent for airway protection. - will continue on ceftriaxone 2gm IV Q 12 hr monotherapy and d/c vancomycin & once he can get IV access, will switch to continuous infusion penicillin - finish steroids after day 4.  - if blood cx remain NGTD at 48hrs, can proceed with picc line if  patient has difficult line access  Encephalopathy = once patient is extubated, can reassess at that time to decide if further tests/imaging is needed  Left forearm swelling = try to keep arm elevated, unclear if swelling from infiltrated PIV vs wrist restraints  Dr Linus Salmons to see over the weekend  [facility] Cell: [phone] Pager: [phone]  05/02/2021, 12:51 PM

## 2021-05-02 NOTE — Progress Notes (Signed)
Spoke to primary RN, PICC will be done 5/21.

## 2021-05-02 NOTE — Progress Notes (Signed)
[place] Progress Note Patient Name: NAFTOLI PENNY DOB: March 20, 1961 MRN: [id number]   Date of Service  05/02/2021  HPI/Events of Note  Request to review CXR for R PICC line placement. Right-sided PICC line with distal tip in expected position of the SVC.   eICU Interventions  OK to use R PICC line.      Intervention Category Major Interventions: Other:  Lysle Dingwall 05/02/2021, 10:43 PM

## 2021-05-02 NOTE — Progress Notes (Signed)
Peripherally Inserted Central Catheter Placement  The IV Nurse has discussed with the patient and/or persons authorized to consent for the patient, the purpose of this procedure and the potential benefits and risks involved with this procedure.  The benefits include less needle sticks, lab draws from the catheter, and the patient may be discharged home with the catheter. Risks include, but not limited to, infection, bleeding, blood clot (thrombus formation), and puncture of an artery; nerve damage and irregular heartbeat and possibility to perform a PICC exchange if needed/ordered by physician.  Alternatives to this procedure were also discussed.  Bard Power PICC patient education guide, fact sheet on infection prevention and patient information card has been provided to patient /or left at bedside.    PICC Placement Documentation  PICC Double Lumen 38/25/05 PICC Right Basilic 45 cm 0 cm (Active)  Indication for Insertion or Continuance of Line Prolonged intravenous therapies 05/02/21 1600  Exposed Catheter (cm) 0 cm 05/02/21 1600  Site Assessment Clean;Dry;Intact 05/02/21 1600  Lumen #1 Status Flushed;Blood return noted;Saline locked 05/02/21 1600  Lumen #2 Status Flushed;Blood return noted;Saline locked 05/02/21 1600  Dressing Type Transparent 05/02/21 1600  Dressing Status Clean;Dry;Intact 05/02/21 1600  Antimicrobial disc in place? Yes 05/02/21 1600  Dressing Change Due 05/09/21 05/02/21 1600       Scotty Court 05/02/2021, 4:08 PM

## 2021-05-02 NOTE — Progress Notes (Signed)
NAME:  Hayden Mcbride, MRN:  [id number], DOB:  1961-11-06, LOS: 2 ADMISSION DATE:  04/29/2021, CONSULTATION DATE:  05/02/21 REFERRING MD:  EDP, CHIEF COMPLAINT:  AMS   Brief summary:  60 year old male with sudden onset of headache and hearing loss  f/b AMS.  Recent return from 2 week trip to [place] early May.  Early CSF and MRI findings suggestive for bacterial meningitis.  ID consulted.    Pertinent  Medical History   has a past medical history of Allergy, Dry eyes, GERD (gastroesophageal reflux disease), Glaucoma, adenomatous colonic polyps (07/08/2006), Hypertension, Post-operative nausea and vomiting, Sleep apnea, and Thyroid disease.   Significant Hospital Events: Including procedures, antibiotic start and stop dates in addition to other pertinent events   . 5/17 presented to ED, intubated for agitation and airway protection . 5/18 PCCM consulted for admission, lumbar puncture completed, ID consulted.  EEG w/right hemispheric cortical dysfunction, no evidence of seizures.  Started on decadron; precedex off, remain on fentanyl gtt   Tubes/lines ETT 5/17 >> Foley 5/17 >> 5/19  Antibiotics: Ampicillin 5/17>> 5/19 Ceftriaxone 5/17 (2gm q 12hr) >> Vancomycin 5/17 >>  Acyclovir 5/18  Micro: 5/18 CSF culture and gram stain>> S. Pneumoniae >> pan sensitive 5/18 CSF fungal cx>>         CSF HSV >> 5/18 BCx >>   Interim History / Subjective:   I/O+ 6 L total WBC 17 Fentanyl 50 Remains on ceftriaxone, vancomycin, dexamethasone Currently on PSV 8 and tolerating well  Objective   Blood pressure 138/76, pulse 100, temperature 99 F (37.2 C), temperature source Axillary, resp. rate 18, height 5' 10.98" (1.803 m), weight 114.5 kg, SpO2 96 %.    Vent Mode: PSV;CPAP FiO2 (%):  [40 %] 40 % Set Rate:  [16 bmp] 16 bmp Vt Set:  [600 mL] 600 mL PEEP:  [5 cmH20] 5 cmH20 Pressure Support:  [8 cmH20] 8 cmH20 Plateau Pressure:  [15 cmH20] 15 cmH20   Intake/Output Summary (Last 24 hours)  at 05/02/2021 8850 Last data filed at 05/02/2021 0600 Gross per 24 hour  Intake 2514.98 ml  Output 1470 ml  Net 1044.98 ml   Filed Weights   04/29/21 2317 05/02/21 0400  Weight: 116.8 kg 114.5 kg   General: Obese man, ventilated, comfortable, fentanyl 25 HEENT: ET tube in place, oropharynx otherwise clear, pupils reactive Neuro: Opens eyes to voice, turned head but did not really appear to track.  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 -Pneumococcus appears to be pansensitive, question whether we will transition off vancomycin, continue ceftriaxone monotherapy -Continue current dexamethasone, plan for days total -Frequent neurochecks -Seizure precautions  Bradycardia -resolved, following telemetry  Acute hypoxic respiratory failure -continue current MV support, lung protective strategy PRVC 6 cc/kg -VAP prevention orders in place -PAD protocol for sedation, wean as able -Push for SBT.  Goal extubation when neurological status, mental status will allow.  Electrolyte imbalance -follow BMP -Correct, replace as indicated  Thrombocytopenia, likely due to sepsis Leukocytosis  -following CBC  History of hypertension -Home antihypertensive regimen is on hold  Best practice (right click and "Reselect all SmartList Selections" daily)  Diet:  Tube Feed ; TF  Pain/Anxiety/Delirium protocol (if indicated): Yes (RASS goal 0) VAP protocol (if indicated): Yes DVT prophylaxis: SCD   GI prophylaxis: PPI Glucose control:  SSI Yes Central venous access:  N/A Arterial line:  N/A Foley:  N/A;  Mobility:  bed rest  PT consulted: N/A Last date of multidisciplinary goals of care discussion [pending] Code Status:  full code Disposition: ICU  Wife updated at bedside on 5/20.   Labs   CBC: Recent Labs  Lab 04/29/21 2234 04/29/21 2240 04/29/21 2358 04/30/21 0735 05/01/21 0207 05/01/21 0436 05/02/21 0616  WBC 18.3*  --   --  18.9* 19.2*  --  17.1*  NEUTROABS 16.9*  --   --   --   --   --   --   HGB 18.7*   < > 18.4* 17.4* 16.1 14.6 15.2  HCT 55.4*   < > 54.0* 51.5 47.3 43.0 46.6  MCV 94.4  --   --  93.5 94.0  --  96.9  PLT 142*  --   --  145* PLATELET CLUMPS NOTED ON SMEAR, UNABLE TO ESTIMATE  --  107*   < > = values in this interval not displayed.    Basic Metabolic Panel: Recent Labs  Lab 04/29/21 2234 04/29/21 2240 04/29/21 2358 04/30/21 0735 05/01/21 0207 05/01/21 0436 05/02/21 0616  NA 139 142 141 138 141 143 141  K 3.2* 3.2* 2.8* 3.9 3.9 3.8 4.7  CL 105 104  --  106 111  --  110  CO2 21*  --   --  23 23  --  25  GLUCOSE 129* 125*  --  127* 137*  --  162*  BUN 13 17  --  18 24*  --  30*  CREATININE 1.05 0.80  --  1.13 0.98  --  0.92  CALCIUM 9.0  --   --  8.5* 8.3*  --  8.0*  MG  --   --   --  1.6* 2.5*  --   --   PHOS  --   --   --  3.3 2.8  --   --    GFR: Estimated Creatinine Clearance: 108.5 mL/min (by C-G formula based on SCr of 0.92 mg/dL). Recent Labs  Lab 04/29/21 2234 04/29/21 2354 04/30/21 0735 04/30/21 1213 05/01/21 0207 05/02/21 0616  WBC 18.3*  --  18.9*  --  19.2* 17.1*  LATICACIDVEN  --  3.1*  --  2.8*  --   --     Liver Function Tests: Recent Labs  Lab 04/29/21 2234 05/01/21 0207  AST 32 19  ALT 27 24  ALKPHOS 50 37*  BILITOT 2.6* 0.4  PROT 7.0 6.1*  ALBUMIN 4.0 2.8*   No results for input(s): LIPASE, AMYLASE in the last 168 hours. No results for input(s): AMMONIA in the last 168 hours.  ABG     Component Value Date/Time   PHART 7.374 05/01/2021 0436   PCO2ART 43.0 05/01/2021 0436   PO2ART 105 05/01/2021 0436   HCO3 25.1 05/01/2021 0436   TCO2 26 05/01/2021 0436   ACIDBASEDEF 2.0 04/29/2021 2358   O2SAT 98.0 05/01/2021 0436     Coagulation Profile: Recent Labs  Lab 04/30/21 0027  INR 1.4*    Cardiac Enzymes: No results for input(s): CKTOTAL, CKMB, CKMBINDEX, TROPONINI in the last 168 hours.  HbA1C: Hgb A1c MFr Bld  Date/Time Value Ref Range Status  04/30/2021 07:39 AM 5.4 4.8 - 5.6 % Final    Comment:    (NOTE) Pre diabetes:          5.7%-6.4%  Diabetes:              >6.4%  Glycemic control for   <7.0% adults with diabetes     CBG: Recent Labs  Lab 05/01/21 1544 05/01/21 1928 05/01/21 2313 05/02/21 0344 05/02/21 0722  GLUCAP 140* 151* 150* 150* 150*    Independent CC time 32 minutes  Baltazar Apo, MD, PhD 05/02/2021, 8:50 AM [facility] [phone] or if no answer before 7:00PM call [phone] For any issues after 7:00PM please call eLink [phone]

## 2021-05-02 NOTE — TOC Initial Note (Signed)
60 year old employedTransition of Care [facility]) - Initial/[facility]essment [facility]    Patient Details  Name: Hayden Mcbride MRN: [id number] Date of Birth: 1961/10/22  Transition of Care [facility]) CM/SW Contact:    Verdell Carmine, RN Phone Number: 05/02/2021, 3:46 PM  Clinical Narrative:                 60 year old employed lives at home with spouse. In with bacterial meningitis ALOC, intubated on IV antibiotics ID consultation.  Has SO as support. PT and OT will have to assess for potential HH needs. CM will follow  Expected Discharge Plan: Home/Self Care Barriers to Discharge: Continued Medical Work up   Patient Goals and CMS Choice        Expected Discharge Plan and Services Expected Discharge Plan: Home/Self Care       Living arrangements for the past 2 months: Single Family Home                                      Prior Living Arrangements/Services Living arrangements for the past 2 months: Single Family Home Lives with:: Spouse Patient language and need for interpreter reviewed:: Yes        Need for Family Participation in Patient Care: Yes (Comment) Care giver support system in place?: Yes (comment)   Criminal Activity/Legal Involvement Pertinent to Current Situation/Hospitalization: No - Comment as needed  Activities of Daily Living Home Assistive Devices/Equipment: None ADL Screening (condition at time of admission) Patient's cognitive ability adequate to safely complete daily activities?: No Is the patient deaf or have difficulty hearing?: Yes Does the patient have difficulty seeing, even when wearing glasses/contacts?: No Does the patient have difficulty concentrating, remembering, or making decisions?: Yes Patient able to express need for assistance with ADLs?: No Does the patient have difficulty dressing or bathing?: Yes Independently performs ADLs?: No Communication: Dependent Is this a change from baseline?: Change from baseline, expected to last <3  days Dressing (OT): Dependent Is this a change from baseline?: Change from baseline, expected to last <3days Grooming: Dependent Is this a change from baseline?: Change from baseline, expected to last <3 days Feeding: Dependent Is this a change from baseline?: Change from baseline, expected to last <3 days Bathing: Dependent Is this a change from baseline?: Change from baseline, expected to last <3 days Toileting: Dependent Is this a change from baseline?: Change from baseline, expected to last <3 days In/Out Bed: Dependent Is this a change from baseline?: Change from baseline, expected to last <3 days Walks in Home: Dependent Is this a change from baseline?: Change from baseline, expected to last <3 days Does the patient have difficulty walking or climbing stairs?: Yes Weakness of Legs: None Weakness of Arms/Hands: None  Permission Sought/Granted                  Emotional Assessment   Attitude/Demeanor/Rapport: Unable to Assess Affect (typically observed): Unable to Assess   Alcohol / Substance Use: Not Applicable Psych Involvement: No (comment)  Admission diagnosis:  Altered mental status, unspecified altered mental status type [R41.82] AMS (altered mental status) [R41.82] Patient Active Problem List   Diagnosis Date Noted  . AMS (altered mental status) 04/30/2021  . Leukocytosis   . Family history of colon cancer 04/24/2015  . Dyspnea 03/01/2015  . RBBB 03/01/2015  . Preoperative cardiovascular examination 03/01/2015  . Pain in the chest 03/01/2015  .  Morbid obesity ([place]) 03/01/2015  . Hx of adenomatous colonic polyps 07/08/2006   PCP:  Robyne Peers, MD Pharmacy:   [facility] [phone] - [place], [place] AT [address] [place] [address] Phone: [phone] Fax: [phone]     Social Determinants of Health (SDOH) Interventions    Readmission Risk Interventions No flowsheet data  found.

## 2021-05-02 NOTE — Plan of Care (Signed)
  Problem: Clinical Measurements: Goal: Ability to maintain clinical measurements within normal limits will improve Outcome: Progressing Goal: Will remain free from infection Outcome: Progressing Goal: Diagnostic test results will improve Outcome: Progressing Goal: Respiratory complications will improve Outcome: Progressing Goal: Cardiovascular complication will be avoided Outcome: Progressing   Problem: Activity: Goal: Risk for activity intolerance will decrease Outcome: Progressing   Problem: Nutrition: Goal: Adequate nutrition will be maintained Outcome: Progressing   Problem: Safety: Goal: Ability to remain free from injury will improve Outcome: Progressing   Problem: Skin Integrity: Goal: Risk for impaired skin integrity will decrease Outcome: Progressing   Problem: Safety: Goal: Non-violent Restraint(s) Outcome: Progressing   Problem: Education: Goal: Knowledge of General Education information will improve Description: Including pain rating scale, medication(s)/side effects and non-pharmacologic comfort measures Outcome: Not Progressing   Problem: Health Behavior/Discharge Planning: Goal: Ability to manage health-related needs will improve Outcome: Not Progressing   Problem: Coping: Goal: Level of anxiety will decrease Outcome: Not Progressing   Problem: Elimination: Goal: Will not experience complications related to bowel motility Outcome: Not Progressing Goal: Will not experience complications related to urinary retention Outcome: Not Progressing   Problem: Pain Managment: Goal: General experience of comfort will improve Outcome: Not Progressing

## 2021-05-03 DIAGNOSIS — J9601 Acute respiratory failure with hypoxia: Secondary | ICD-10-CM

## 2021-05-03 DIAGNOSIS — R4182 Altered mental status, unspecified: Secondary | ICD-10-CM | POA: Diagnosis not present

## 2021-05-03 DIAGNOSIS — J9600 Acute respiratory failure, unspecified whether with hypoxia or hypercapnia: Secondary | ICD-10-CM

## 2021-05-03 DIAGNOSIS — G039 Meningitis, unspecified: Secondary | ICD-10-CM | POA: Diagnosis not present

## 2021-05-03 DIAGNOSIS — R410 Disorientation, unspecified: Secondary | ICD-10-CM | POA: Diagnosis not present

## 2021-05-03 DIAGNOSIS — G001 Pneumococcal meningitis: Secondary | ICD-10-CM | POA: Diagnosis not present

## 2021-05-03 LAB — MAGNESIUM: Magnesium: 2.5 mg/dL — ABNORMAL HIGH (ref 1.7–2.4)

## 2021-05-03 LAB — BASIC METABOLIC PANEL
Anion gap: 6 (ref 5–15)
Anion gap: 6 (ref 5–15)
BUN: 28 mg/dL — ABNORMAL HIGH (ref 8–23)
BUN: 28 mg/dL — ABNORMAL HIGH (ref 8–23)
CO2: 28 mmol/L (ref 22–32)
CO2: 27 mmol/L (ref 22–32)
Calcium: 8 mg/dL — ABNORMAL LOW (ref 8.9–10.3)
Calcium: 7.9 mg/dL — ABNORMAL LOW (ref 8.9–10.3)
Chloride: 110 mmol/L (ref 98–111)
Chloride: 112 mmol/L — ABNORMAL HIGH (ref 98–111)
Creatinine, Ser: 0.83 mg/dL (ref 0.61–1.24)
Creatinine, Ser: 0.66 mg/dL (ref 0.61–1.24)
GFR, Estimated: >60 mL/min (ref >60)
GFR, Estimated: >60 mL/min (ref >60)
Glucose, Bld: 179 mg/dL — ABNORMAL HIGH (ref 70–99)
Glucose, Bld: 194 mg/dL — ABNORMAL HIGH (ref 70–99)
Potassium: 4.2 mmol/L (ref 3.5–5.1)
Potassium: 4.6 mmol/L (ref 3.5–5.1)
Sodium: 144 mmol/L (ref 135–145)
Sodium: 145 mmol/L (ref 135–145)

## 2021-05-03 LAB — CBC
HCT: 48.9 % (ref 39.0–52.0)
Hemoglobin: 16 g/dL (ref 13.0–17.0)
MCH: 31.7 pg (ref 26.0–34.0)
MCHC: 32.7 g/dL (ref 30.0–36.0)
MCV: 96.8 fL (ref 80.0–100.0)
Platelets: 137 10*3/uL — ABNORMAL LOW (ref 150–400)
RBC: 5.05 MIL/uL (ref 4.22–5.81)
RDW: 13.5 % (ref 11.5–15.5)
WBC: 17.2 10*3/uL — ABNORMAL HIGH (ref 4.0–10.5)
nRBC: 0 % (ref 0.0–0.2)

## 2021-05-03 LAB — GLUCOSE, CAPILLARY
Glucose-Capillary: 175 mg/dL — ABNORMAL HIGH (ref 70–99)
Glucose-Capillary: 179 mg/dL — ABNORMAL HIGH (ref 70–99)
Glucose-Capillary: 180 mg/dL — ABNORMAL HIGH (ref 70–99)
Glucose-Capillary: 187 mg/dL — ABNORMAL HIGH (ref 70–99)
Glucose-Capillary: 195 mg/dL — ABNORMAL HIGH (ref 70–99)
Glucose-Capillary: 196 mg/dL — ABNORMAL HIGH (ref 70–99)

## 2021-05-03 MED ORDER — PENICILLIN G POTASSIUM 20000000 UNITS IJ SOLR
12.0000 10*6.[IU] | Freq: Two times a day (BID) | INTRAVENOUS | Status: AC
  Administered 2021-05-03 – 2021-05-13 (×22): 12 10*6.[IU] via INTRAVENOUS
  Filled 2021-05-03 (×3): qty 12
  Filled 2021-05-03: qty 5
  Filled 2021-05-03 (×3): qty 12
  Filled 2021-05-03: qty 5
  Filled 2021-05-03 (×6): qty 12
  Filled 2021-05-03: qty 5
  Filled 2021-05-03 (×10): qty 12

## 2021-05-03 MED ORDER — SODIUM CHLORIDE 0.9% FLUSH: 10.0000 mL | INTRAVENOUS | Status: DC | PRN

## 2021-05-03 MED ORDER — METOPROLOL TARTRATE 25 MG/10 ML ORAL SUSPENSION
25.0000 mg | Freq: Two times a day (BID) | ORAL | Status: DC
  Administered 2021-05-03: 25 mg
  Filled 2021-05-03 (×2): qty 10

## 2021-05-03 MED ORDER — SODIUM CHLORIDE 0.9% FLUSH
10.0000 mL | Freq: Two times a day (BID) | INTRAVENOUS | Status: DC
  Administered 2021-05-03 (×2): 10 mL
  Administered 2021-05-05: 20 mL
  Administered 2021-05-05: 10 mL
  Administered 2021-05-06: 20 mL
  Administered 2021-05-06 – 2021-05-10 (×9): 10 mL
  Administered 2021-05-11 – 2021-05-12 (×2): 20 mL
  Administered 2021-05-12 – 2021-05-19 (×13): 10 mL

## 2021-05-03 NOTE — Progress Notes (Signed)
[facility] for Infectious Disease   Reason for visit: Follow up on meningitis  Interval History: sensitivities to Strep pneumonia noted and pansensitive.  Remains intubated.   Penicillin day 1 Total antibiotics day 5   Physical Exam: Constitutional:  Vitals:   05/03/21 1100 05/03/21 1157  BP:  (!) 159/93  Pulse:  93  Resp:  15  Temp: 100.1 F (37.8 C)   SpO2:  94%   patient appears in NAD Eyes: anicteric HENT: +ET Respiratory: respiratory effort on vent; CTA B Cardiovascular: RRR Skin: no rashes  Review of Systems: Unable to be assessed due to patient factors  Lab Results  Component Value Date   WBC 17.2 (H) 05/03/2021   HGB 16.0 05/03/2021   HCT 48.9 05/03/2021   MCV 96.8 05/03/2021   PLT 137 (L) 05/03/2021    Lab Results  Component Value Date   CREATININE 0.83 05/03/2021   BUN 28 (H) 05/03/2021   NA 144 05/03/2021   K 4.2 05/03/2021   CL 110 05/03/2021   CO2 28 05/03/2021    Lab Results  Component Value Date   ALT 24 05/01/2021   AST 19 05/01/2021   ALKPHOS 37 (L) 05/01/2021     Microbiology: Recent Results (from the past 240 hour(s))  SARS CORONAVIRUS 2 (TAT 6-24 HRS) Nasopharyngeal Nasopharyngeal Swab     Status: None   Collection Time: 04/29/21 10:57 PM   Specimen: Nasopharyngeal Swab  Result Value Ref Range Status   SARS Coronavirus 2 NEGATIVE NEGATIVE Final    Comment: (NOTE) SARS-CoV-2 target nucleic acids are NOT DETECTED.  The SARS-CoV-2 RNA is generally detectable in upper and lower respiratory specimens during the acute phase of infection. Negative results do not preclude SARS-CoV-2 infection, do not rule out co-infections with other pathogens, and should not be used as the sole basis for treatment or other patient management decisions. Negative results must be combined with clinical observations, patient history, and epidemiological information. The expected result is Negative.  Fact Sheet for  Patients: SugarRoll.be  Fact Sheet for Healthcare Providers: https://www.woods-mathews.com/  This test is not yet approved or cleared by the [place] FDA and  has been authorized for detection and/or diagnosis of SARS-CoV-2 by FDA under an Emergency Use Authorization (EUA). This EUA will remain  in effect (meaning this test can be used) for the duration of the COVID-19 declaration under Se ction 564(b)(1) of the Act, 21 U.S.C. section 360bbb-3(b)(1), unless the authorization is terminated or revoked sooner.  Performed at [facility], [place] [address], [place], [place] [address]   CSF culture     Status: None   Collection Time: 04/30/21  2:57 AM   Specimen: CSF; Cerebrospinal Fluid  Result Value Ref Range Status   Specimen Description CSF  Final   Special Requests Normal  Final   Gram Stain   Final    GRAM POSITIVE COCCI WBC PRESENT, PREDOMINANTLY MONONUCLEAR CRITICAL RESULT CALLED TO, READ BACK BY AND VERIFIED WITH: RN MEGAN PLUMMER BY MESSAN H. AT 0345 ON 5 18 2022 CYTOSPIN SMEAR Performed at [facility], [place] [address], [place], [place] [address]    Culture FEW STREPTOCOCCUS PNEUMONIAE  Final   Report Status 05/02/2021 FINAL  Final   Organism ID, Bacteria STREPTOCOCCUS PNEUMONIAE  Final      Susceptibility   Streptococcus pneumoniae - MIC*    ERYTHROMYCIN <=0.12 SENSITIVE Sensitive     LEVOFLOXACIN 0.5 SENSITIVE Sensitive     VANCOMYCIN 0.5 SENSITIVE  Sensitive     PENICILLIN (meningitis) <=0.06 SENSITIVE Sensitive     PENO - penicillin <=0.06      PENICILLIN (non-meningitis) <=0.06 SENSITIVE Sensitive     PENICILLIN (oral) <=0.06 SENSITIVE Sensitive     CEFTRIAXONE (non-meningitis) <=0.12 SENSITIVE Sensitive     CEFTRIAXONE (meningitis) <=0.12 SENSITIVE Sensitive     * FEW STREPTOCOCCUS PNEUMONIAE  Culture, fungus without smear     Status: None (Preliminary result)   Collection Time: 04/30/21  2:57 AM    Specimen: CSF; Cerebrospinal Fluid  Result Value Ref Range Status   Specimen Description CSF  Final   Special Requests NONE  Final   Culture   Final    NO FUNGUS ISOLATED AFTER 2 DAYS Performed at [facility] [address] [place], [place] [address]    Report Status PENDING  Incomplete  MRSA PCR Screening     Status: Abnormal   Collection Time: 04/30/21  5:17 AM   Specimen: Nasal Mucosa; Nasopharyngeal  Result Value Ref Range Status   MRSA by PCR POSITIVE (A) NEGATIVE Final    Comment:        The GeneXpert MRSA Assay (FDA approved for NASAL specimens only), is one component of a comprehensive MRSA colonization surveillance program. It is not intended to diagnose MRSA infection nor to guide or monitor treatment for MRSA infections. RESULT CALLED TO, READ BACK BY AND VERIFIED WITH: PLUMBER,M RN 04/30/2021 AT 9622 SKEEN,P Performed at [facility], [place] [address] [place], [place] [address]   Culture, blood (Routine X 2) w Reflex to ID Panel     Status: None (Preliminary result)   Collection Time: 04/30/21 11:30 AM   Specimen: BLOOD  Result Value Ref Range Status   Specimen Description BLOOD LEFT ANTECUBITAL  Final   Special Requests   Final    BOTTLES DRAWN AEROBIC AND ANAEROBIC Blood Culture adequate volume   Culture   Final    NO GROWTH 2 DAYS Performed at [facility], [place] [address], [place], [place] [address]    Report Status PENDING  Incomplete  Culture, blood (Routine X 2) w Reflex to ID Panel     Status: None (Preliminary result)   Collection Time: 04/30/21 11:47 AM   Specimen: BLOOD LEFT FOREARM  Result Value Ref Range Status   Specimen Description BLOOD LEFT FOREARM  Final   Special Requests   Final    BOTTLES DRAWN AEROBIC ONLY Blood Culture results may not be optimal due to an inadequate volume of blood received in culture bottles   Culture   Final    NO GROWTH 2 DAYS Performed at [facility], [place] [address], [place], [place]  [address]    Report Status PENDING  Incomplete    Impression/Plan:  1. Strep pneumonia meningitis - penicillin sensitive and now on infusion of penicillin. Culture in CSF positive, blood cultures remain negative.   On dexamethasone for 4 days.  2.  Altered mental status - continues to be encephalopathic.  Reducing sedating medications.    3. Acute hypoxic respiratory failure - remains vented and difficult to attempt weaning due to mental status.

## 2021-05-03 NOTE — Progress Notes (Signed)
NAME:  Hayden Mcbride, MRN:  [id number], DOB:  1961/11/26, LOS: 3 ADMISSION DATE:  04/29/2021, CONSULTATION DATE:  05/03/21 REFERRING MD:  EDP, CHIEF COMPLAINT:  AMS   Brief summary:  60 year old male with sudden onset of headache and hearing loss  f/b AMS.  Recent return from 2 week trip to [place] early May.  Early CSF and MRI findings suggestive for bacterial meningitis.  ID consulted.    Pertinent  Medical History   has a past medical history of Allergy, Dry eyes, GERD (gastroesophageal reflux disease), Glaucoma, adenomatous colonic polyps (07/08/2006), Hypertension, Post-operative nausea and vomiting, Sleep apnea, and Thyroid disease.   Significant Hospital Events: Including procedures, antibiotic start and stop dates in addition to other pertinent events   . 5/17 presented to ED, intubated for agitation and airway protection . 5/18 PCCM consulted for admission, lumbar puncture completed, ID consulted.  EEG w/right hemispheric cortical dysfunction, no evidence of seizures.  Started on decadron; precedex off, remain on fentanyl gtt   Tubes/lines ETT 5/17 >> Foley 5/17 >> 5/19  Antibiotics: Ampicillin 5/17>> 5/19 Ceftriaxone 5/17 (2gm q 12hr) >> 5/21 Vancomycin 5/17 >> 5/20 Acyclovir 5/18 Penicillin cont Infusion 5/21 >>   Micro: 5/18 CSF culture and gram stain>> S. Pneumoniae >> pan sensitive 5/18 CSF fungal cx>>         CSF HSV >> 5/18 BCx >>   Interim History / Subjective:   Vancomycin stopped on 5/20 WBC 17 I/O+ 6.3 L total Some intermittent agitation when fentanyl was weaned  Objective   Blood pressure (!) 168/100, pulse (!) 48, temperature 100 F (37.8 C), temperature source Axillary, resp. rate 16, height 5' 10.98" (1.803 m), weight 114.3 kg, SpO2 95 %.    Vent Mode: PRVC FiO2 (%):  [40 %] 40 % Set Rate:  [16 bmp] 16 bmp Vt Set:  [600 mL] 600 mL PEEP:  [5 cmH20] 5 cmH20 Pressure Support:  [8 cmH20] 8 cmH20 Plateau Pressure:  [13 cmH20-15 cmH20] 15 cmH20    Intake/Output Summary (Last 24 hours) at 05/03/2021 1111 Last data filed at 05/03/2021 0600 Gross per 24 hour  Intake 1793.31 ml  Output 1425 ml  Net 368.31 ml   Filed Weights   04/29/21 2317 05/02/21 0400 05/03/21 0441  Weight: 116.8 kg 114.5 kg 114.3 kg   General: Obese, ventilated, some random movement HEENT: ET tube in good position, oropharynx otherwise clear, pupils reactive, Neuro: Opens eyes, turns head, does not track, does not follow commands, some random movements upper and lower extremities that does not appear to be purposeful CV: Regular, distant, frequent PVCs by monitor PULM: Decreased at both bases, no crackles or wheezes  GI: Obese, nondistended with positive bowel sounds Extremities: No edema Skin: No rash  Labs/imaging that I havepersonally reviewed  (right click and "Reselect all SmartList Selections" daily)   All labs and imaging reviewed  Resolved Hospital Problem list   Lactic acidosis Erythrocytosis Bradycardia  Assessment & Plan:   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  Duration as per ID recommendations -Complete 4 days dexamethasone as planned -Frequent neurochecks -Seizure precautions -Attempt to wean sedating medications to assess neurological improvement  Acute hypoxic respiratory failure -continue current MV support, 8 cc/kg -VAP prevention order set in place -PAD protocol for sedation, currently fentanyl, wean as able -Okay to continue to push for SBT, PSV but he does not have  the mental status or extubation at this time  Electrolyte imbalance -follow BMP -Replete electrolytes as indicated  Thrombocytopenia, likely due to sepsis Leukocytosis  -following CBC  History of hypertension -Home antihypertensive regimen currently held.  May need to consider restart as we lighten his  sedation.  Was on amlodipine, losartan.  Best practice (right click and "Reselect all SmartList Selections" daily)  Diet:  Tube Feed ; TF  Pain/Anxiety/Delirium protocol (if indicated): Yes (RASS goal 0) VAP protocol (if indicated): Yes DVT prophylaxis: SCD  GI prophylaxis: PPI Glucose control:  SSI Yes Central venous access:  N/A Arterial line:  N/A Foley:  N/A;  Mobility:  bed rest  PT consulted: N/A Last date of multidisciplinary goals of care discussion [pending] Code Status:  full code Disposition: ICU  Wife updated at bedside on 5/21  Labs   CBC: Recent Labs  Lab 04/29/21 2234 04/29/21 2240 04/30/21 0735 05/01/21 0207 05/01/21 0436 05/02/21 0616 05/03/21 0456  WBC 18.3*  --  18.9* 19.2*  --  17.1* 17.2*  NEUTROABS 16.9*  --   --   --   --   --   --   HGB 18.7*   < > 17.4* 16.1 14.6 15.2 16.0  HCT 55.4*   < > 51.5 47.3 43.0 46.6 48.9  MCV 94.4  --  93.5 94.0  --  96.9 96.8  PLT 142*  --  145* PLATELET CLUMPS NOTED ON SMEAR, UNABLE TO ESTIMATE  --  107* 137*   < > = values in this interval not displayed.    Basic Metabolic Panel: Recent Labs  Lab 04/29/21 2234 04/29/21 2240 04/29/21 2358 04/30/21 0735 05/01/21 0207 05/01/21 0436 05/02/21 0616 05/03/21 0456  NA 139 142   < > 138 141 143 141 144  K 3.2* 3.2*   < > 3.9 3.9 3.8 4.7 4.2  CL 105 104  --  106 111  --  110 110  CO2 21*  --   --  23 23  --  25 28  GLUCOSE 129* 125*  --  127* 137*  --  162* 179*  BUN 13 17  --  18 24*  --  30* 28*  CREATININE 1.05 0.80  --  1.13 0.98  --  0.92 0.83  CALCIUM 9.0  --   --  8.5* 8.3*  --  8.0* 8.0*  MG  --   --   --  1.6* 2.5*  --   --   --   PHOS  --   --   --  3.3 2.8  --   --   --    < > = values in this interval not displayed.   GFR: Estimated Creatinine Clearance: 120.2 mL/min (by C-G formula based on SCr of 0.83 mg/dL). Recent Labs  Lab 04/29/21 2354 04/30/21 0735 04/30/21 1213 05/01/21 0207 05/02/21 0616 05/03/21 0456  WBC  --  18.9*  --  19.2*  17.1* 17.2*  LATICACIDVEN 3.1*  --  2.8*  --   --   --     Liver Function Tests: Recent Labs  Lab 04/29/21 2234 05/01/21 0207  AST 32 19  ALT 27 24  ALKPHOS 50 37*  BILITOT 2.6* 0.4  PROT 7.0 6.1*  ALBUMIN 4.0 2.8*   No results for input(s): LIPASE, AMYLASE in the last 168 hours. No results for input(s): AMMONIA in the last 168 hours.  ABG    Component Value Date/Time   PHART 7.374 05/01/2021 0436  PCO2ART 43.0 05/01/2021 0436   PO2ART 105 05/01/2021 0436   HCO3 25.1 05/01/2021 0436   TCO2 26 05/01/2021 0436   ACIDBASEDEF 2.0 04/29/2021 2358   O2SAT 98.0 05/01/2021 0436     Coagulation Profile: Recent Labs  Lab 04/30/21 0027  INR 1.4*    Cardiac Enzymes: No results for input(s): CKTOTAL, CKMB, CKMBINDEX, TROPONINI in the last 168 hours.  HbA1C: Hgb A1c MFr Bld  Date/Time Value Ref Range Status  04/30/2021 07:39 AM 5.4 4.8 - 5.6 % Final    Comment:    (NOTE) Pre diabetes:          5.7%-6.4%  Diabetes:              >6.4%  Glycemic control for   <7.0% adults with diabetes     CBG: Recent Labs  Lab 05/02/21 1600 05/02/21 1926 05/02/21 2339 05/03/21 0307 05/03/21 0746  GLUCAP 150* 150* 180* 180* 175*    Independent CC time 33 minutes  Baltazar Apo, MD, PhD 05/03/2021, 11:11 AM [facility] [phone] or if no answer before 7:00PM call [phone] For any issues after 7:00PM please call eLink [phone]

## 2021-05-03 NOTE — Progress Notes (Signed)
Fentanyl gtt 85 mls wasted with Deberah Castle, RN. Line change for new PICC line.

## 2021-05-03 NOTE — Progress Notes (Signed)
Neurology Progress Note   S:// Seen and examined CSF results consistent with bacterial meningitis, CSF culture growing gram-positive cocci with predominantly mononuclear WBCs. MRI brain also reviewed personally-consistent with meningitis. Appreciate ID consultation-and abx managament .  O:// Current vital signs: BP (!) 168/100   Pulse (!) 48   Temp 100 F (37.8 C) (Axillary)   Resp 16   Ht 5' 10.98" (1.803 m)   Wt 114.3 kg   SpO2 95%   BMI 35.16 kg/m  Vital signs in last 24 hours: Temp:  [98.2 F (36.8 C)-100 F (37.8 C)] 100 F (37.8 C) (05/21 0730) Pulse Rate:  [48-140] 48 (05/21 0733) Resp:  [13-33] 16 (05/21 0733) BP: (135-168)/(74-145) 168/100 (05/21 0700) SpO2:  [90 %-97 %] 95 % (05/21 0733) FiO2 (%):  [40 %] 40 % (05/21 0721) Weight:  [114.3 kg] 114.3 kg (05/21 0441) General: Sedated on fentanyl- intubated HEENT: Normocephalic atraumatic, moderate degree of neck stiffness while trying to flex his neck, much more supple on side to side movement. CVS: Regular rate rhythm Abdomen nondistended nontender Respiratory: Vented Neurological exam Eyes open, seems attemtped tracking ubut not able to Gets extremely agitated and starts moving all 4 extremities Did not wiggle toes to command today Nonverbal Cranial nerves: Disconjugate gaze, equal pupils bilaterally, unable to properly assess extraocular movements reliably, facial symmetry also difficult to ascertain due to ET tube. Motor examination: Strong withdrawal to nox stim in all 4s Sensory exam: As above Coordination cannot be assessed   Medications  Current Facility-Administered Medications:  .  0.9 %  sodium chloride infusion, , Intravenous, PRN, Kipp Brood, MD, Stopped at 05/02/21 1046 .  acetaminophen (TYLENOL) 160 MG/5ML solution 650 mg, 650 mg, Per Tube, Q6H PRN, Anders Simmonds, MD, 650 mg at 05/02/21 0137 .  cefTRIAXone (ROCEPHIN) 2 g in sodium chloride 0.9 % 100 mL IVPB, 2 g, Intravenous, Q12H,  Gleason, Otilio Carpen, PA-C, Stopped at 05/02/21 2358 .  chlorhexidine gluconate (MEDLINE KIT) (PERIDEX) 0.12 % solution 15 mL, 15 mL, Mouth Rinse, BID, Agarwala, Ravi, MD, 15 mL at 05/02/21 2002 .  Chlorhexidine Gluconate Cloth 2 % PADS 6 each, 6 each, Topical, Daily, Kipp Brood, MD, 6 each at 05/02/21 2108 .  dexamethasone (DECADRON) injection 10 mg, 10 mg, Intravenous, Q6H, Amie Portland, MD, 10 mg at 05/03/21 0311 .  docusate (COLACE) 50 MG/5ML liquid 100 mg, 100 mg, Per Tube, BID PRN, Jacky Kindle, MD, 100 mg at 05/02/21 1824 .  feeding supplement (PROSource TF) liquid 45 mL, 45 mL, Per Tube, TID, Jacky Kindle, MD, 45 mL at 05/02/21 2107 .  feeding supplement (VITAL AF 1.2 CAL) liquid 1,000 mL, 1,000 mL, Per Tube, Continuous, Chand, Sudham, MD, Last Rate: 65 mL/hr at 05/03/21 0827, 1,000 mL at 05/03/21 0827 .  fentaNYL (SUBLIMAZE) bolus via infusion 50-100 mcg, 50-100 mcg, Intravenous, Q15 min PRN, Gleason, Otilio Carpen, PA-C, 100 mcg at 05/03/21 0535 .  fentaNYL (SUBLIMAZE) injection 50 mcg, 50 mcg, Intravenous, Once, Gleason, Otilio Carpen, PA-C .  fentaNYL 2539mg in NS 2520m(1037mml) infusion-PREMIX, 50-200 mcg/hr, Intravenous, Continuous, Gleason, LauOtilio CarpenA-C, Last Rate: 10 mL/hr at 05/03/21 0600, 100 mcg/hr at 05/03/21 0600 .  insulin aspart (novoLOG) injection 0-15 Units, 0-15 Units, Subcutaneous, Q4H, Gleason, LauOtilio CarpenA-C, 3 Units at 05/03/21 [id number] LORazepam (ATIVAN) injection 2 mg, 2 mg, Intravenous, Q6H PRN, ChaJacky KindleD, 2 mg at 05/02/21 0837 .  MEDLINE mouth rinse, 15 mL, Mouth Rinse, 10 times per day, AgaKipp BroodD, 15  mL at 05/03/21 0521 .  mupirocin ointment (BACTROBAN) 2 % 1 application, 1 application, Nasal, BID, Kipp Brood, MD, 1 application at 38/25/05 2108 .  pantoprazole sodium (PROTONIX) 40 mg/20 mL oral suspension 40 mg, 40 mg, Per Tube, Daily, Jacky Kindle, MD, 40 mg at 05/02/21 0951 .  polyethylene glycol (MIRALAX / GLYCOLAX) packet 17 g, 17 g, Per Tube,  Daily PRN, Jennelle Human B, NP .  QUEtiapine (SEROQUEL) tablet 50 mg, 50 mg, Per Tube, BID, Jennelle Human B, NP, 50 mg at 05/02/21 2107 .  sodium chloride flush (NS) 0.9 % injection 10-40 mL, 10-40 mL, Intracatheter, Q12H, Collene Gobble, MD, 10 mL at 05/03/21 0326 .  sodium chloride flush (NS) 0.9 % injection 10-40 mL, 10-40 mL, Intracatheter, PRN, Collene Gobble, MD Labs CBC    Component Value Date/Time   WBC 17.2 (H) 05/03/2021 0456   RBC 5.05 05/03/2021 0456   HGB 16.0 05/03/2021 0456   HCT 48.9 05/03/2021 0456   PLT 137 (L) 05/03/2021 0456   MCV 96.8 05/03/2021 0456   MCH 31.7 05/03/2021 0456   MCHC 32.7 05/03/2021 0456   RDW 13.5 05/03/2021 0456   LYMPHSABS 0.7 04/29/2021 2234   MONOABS 0.5 04/29/2021 2234   EOSABS 0.1 04/29/2021 2234   BASOSABS 0.0 04/29/2021 2234    CMP     Component Value Date/Time   NA 144 05/03/2021 0456   K 4.2 05/03/2021 0456   CL 110 05/03/2021 0456   CO2 28 05/03/2021 0456   GLUCOSE 179 (H) 05/03/2021 0456   BUN 28 (H) 05/03/2021 0456   CREATININE 0.83 05/03/2021 0456   CALCIUM 8.0 (L) 05/03/2021 0456   PROT 6.1 (L) 05/01/2021 0207   ALBUMIN 2.8 (L) 05/01/2021 0207   AST 19 05/01/2021 0207   ALT 24 05/01/2021 0207   ALKPHOS 37 (L) 05/01/2021 0207   BILITOT 0.4 05/01/2021 0207   GFRNONAA >60 05/03/2021 0456   CSF analysis CSF tube 1 RBC 91, WBC 585 with 99% segmented neutrophils, yellow CSF, xanthochromic.  Tube 3 RBC count 110, WBC 510, 99% segmented neutrophils. CSF protein greater than 600 CSF glucose less than 20 General CSF appearance: Cloudy  Other infectious disease results: Crypto antigen negative COVID-19 negative HIV negative  Imaging I have reviewed images in epic and the results pertinent to this consultation are: MRI of the brain with leptomeningeal enhancement.  Assessment:  60 year old with sudden onset of altered mental status with a preceding headache, with CSF and MRI findings suggestive of bacterial  meningitis currently on empiric antibiotic coverage.  Impression: Bacterial meningitis  Recommendations: Abx per ID C/W Steroids for four days total Ventilatory support per PCCM ID consultation appreciated Minimize sedation as tolerated If exam does not improve some in 24h, will consider repeat CTH and may be EEG/cEEG  Routine EEG with right hemispheric cortical dysfunction.  No evidence of seizures.  D/W Dr Lamonte Sakai  Updated his wife at bedside  -- Amie Portland, MD Neurologist [facility] Pager: [phone]

## 2021-05-04 ENCOUNTER — Inpatient Hospital Stay (HOSPITAL_COMMUNITY): Payer: Commercial Managed Care - PPO

## 2021-05-04 DIAGNOSIS — J9601 Acute respiratory failure with hypoxia: Secondary | ICD-10-CM | POA: Diagnosis not present

## 2021-05-04 DIAGNOSIS — G009 Bacterial meningitis, unspecified: Secondary | ICD-10-CM | POA: Diagnosis not present

## 2021-05-04 DIAGNOSIS — G039 Meningitis, unspecified: Secondary | ICD-10-CM | POA: Diagnosis not present

## 2021-05-04 LAB — BASIC METABOLIC PANEL
Anion gap: 6 (ref 5–15)
BUN: 26 mg/dL — ABNORMAL HIGH (ref 8–23)
CO2: 28 mmol/L (ref 22–32)
Calcium: 7.9 mg/dL — ABNORMAL LOW (ref 8.9–10.3)
Chloride: 109 mmol/L (ref 98–111)
Creatinine, Ser: 0.78 mg/dL (ref 0.61–1.24)
GFR, Estimated: >60 mL/min (ref >60)
Glucose, Bld: 206 mg/dL — ABNORMAL HIGH (ref 70–99)
Potassium: 4.5 mmol/L (ref 3.5–5.1)
Sodium: 143 mmol/L (ref 135–145)

## 2021-05-04 LAB — GLUCOSE, CAPILLARY
Glucose-Capillary: 177 mg/dL — ABNORMAL HIGH (ref 70–99)
Glucose-Capillary: 192 mg/dL — ABNORMAL HIGH (ref 70–99)
Glucose-Capillary: 195 mg/dL — ABNORMAL HIGH (ref 70–99)
Glucose-Capillary: 210 mg/dL — ABNORMAL HIGH (ref 70–99)
Glucose-Capillary: 211 mg/dL — ABNORMAL HIGH (ref 70–99)
Glucose-Capillary: 227 mg/dL — ABNORMAL HIGH (ref 70–99)

## 2021-05-04 LAB — CBC
HCT: 55.5 % — ABNORMAL HIGH (ref 39.0–52.0)
Hemoglobin: 18.4 g/dL — ABNORMAL HIGH (ref 13.0–17.0)
MCH: 31.5 pg (ref 26.0–34.0)
MCHC: 33.2 g/dL (ref 30.0–36.0)
MCV: 95 fL (ref 80.0–100.0)
Platelets: 148 10*3/uL — ABNORMAL LOW (ref 150–400)
RBC: 5.84 MIL/uL — ABNORMAL HIGH (ref 4.22–5.81)
RDW: 13.7 % (ref 11.5–15.5)
WBC: 16.4 10*3/uL — ABNORMAL HIGH (ref 4.0–10.5)
nRBC: 0 % (ref 0.0–0.2)

## 2021-05-04 LAB — HSV 1/2 AB IGG/IGM CSF
HSV 1/2 Ab Screen IgG, CSF: <0.34 IV (ref <=0.89)
HSV 1/2 Ab, IgM, CSF: 0.66 IV (ref <=0.89)

## 2021-05-04 LAB — PHOSPHORUS: Phosphorus: 1.7 mg/dL — ABNORMAL LOW (ref 2.5–4.6)

## 2021-05-04 LAB — MAGNESIUM: Magnesium: 2.6 mg/dL — ABNORMAL HIGH (ref 1.7–2.4)

## 2021-05-04 IMAGING — DX DG CHEST 1V PORT
2 series · 2 of 2 positions shown · non-contrast
Comparison: [DATE]

CLINICAL DATA: Acute respiratory failure

EXAM:
PORTABLE CHEST 1 VIEW

[chest ap (1 of 2)]
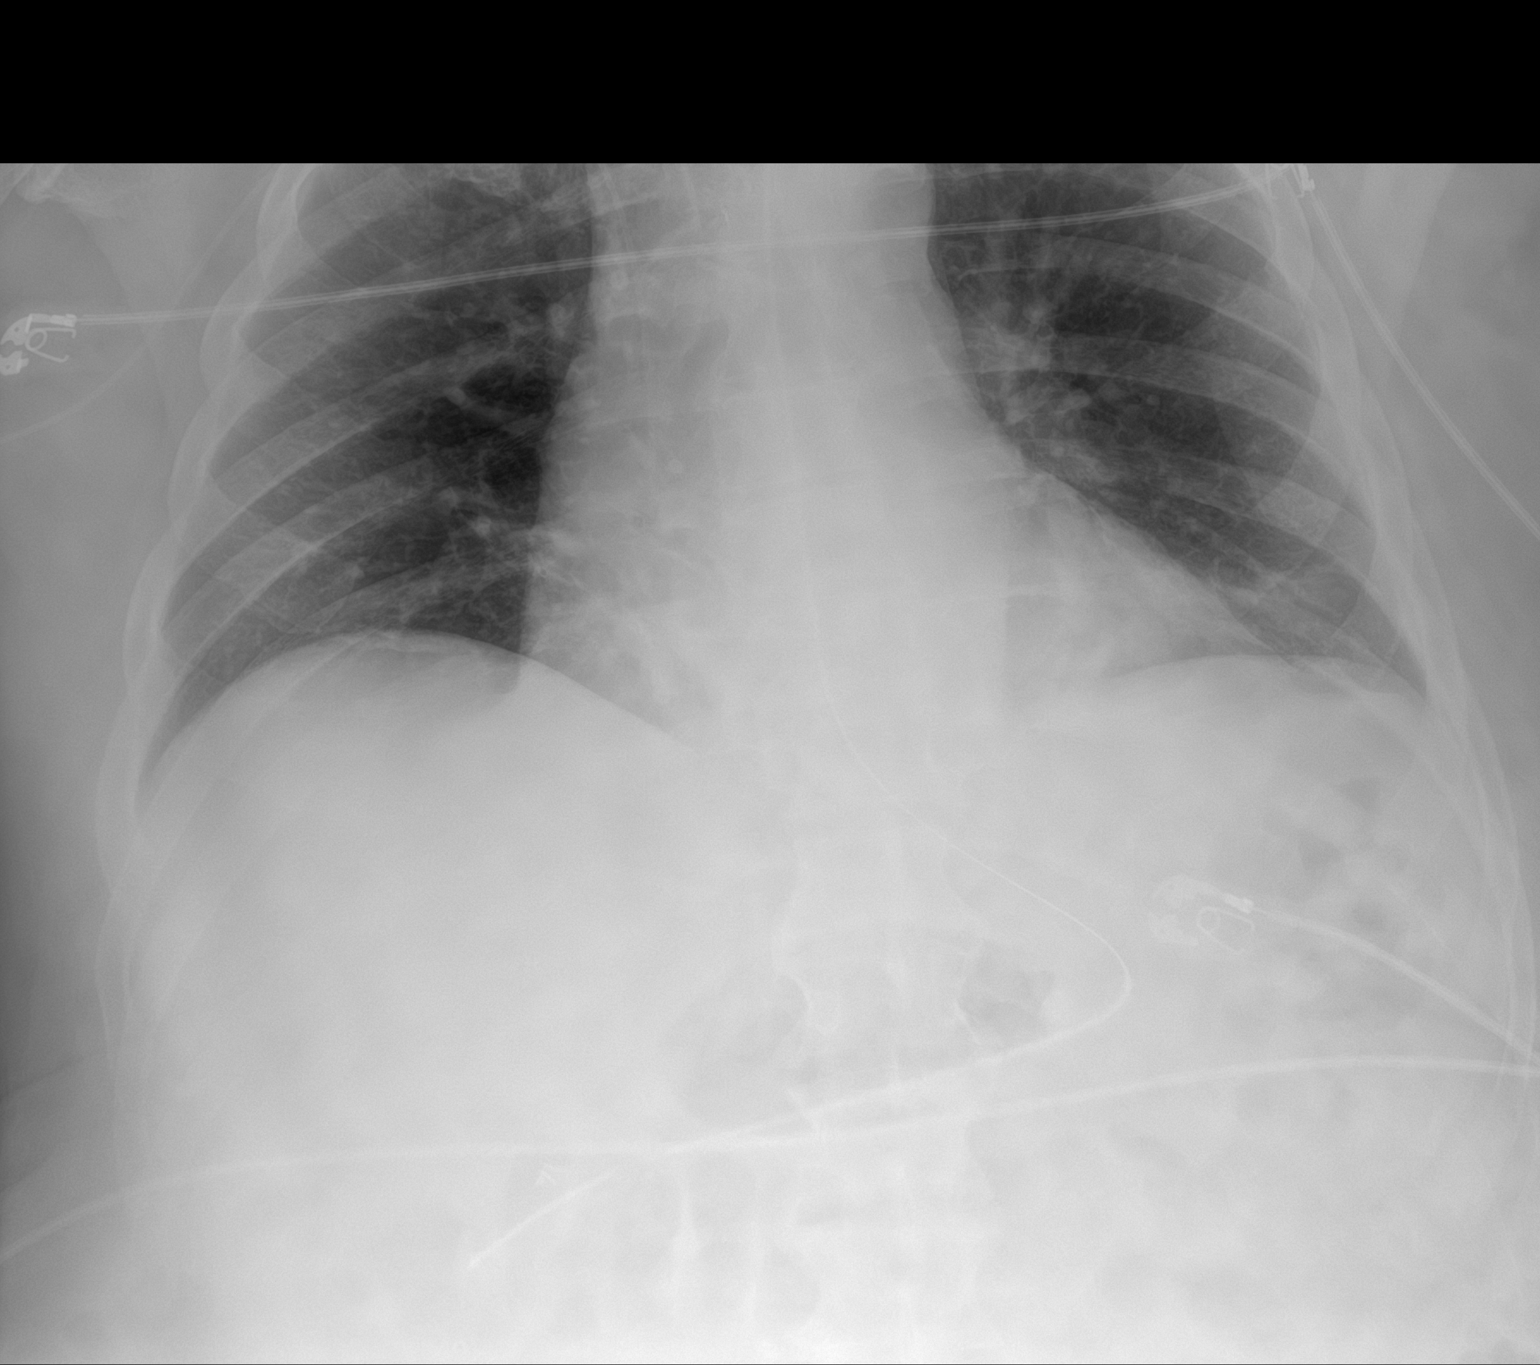

[chest ap (2 of 2)]
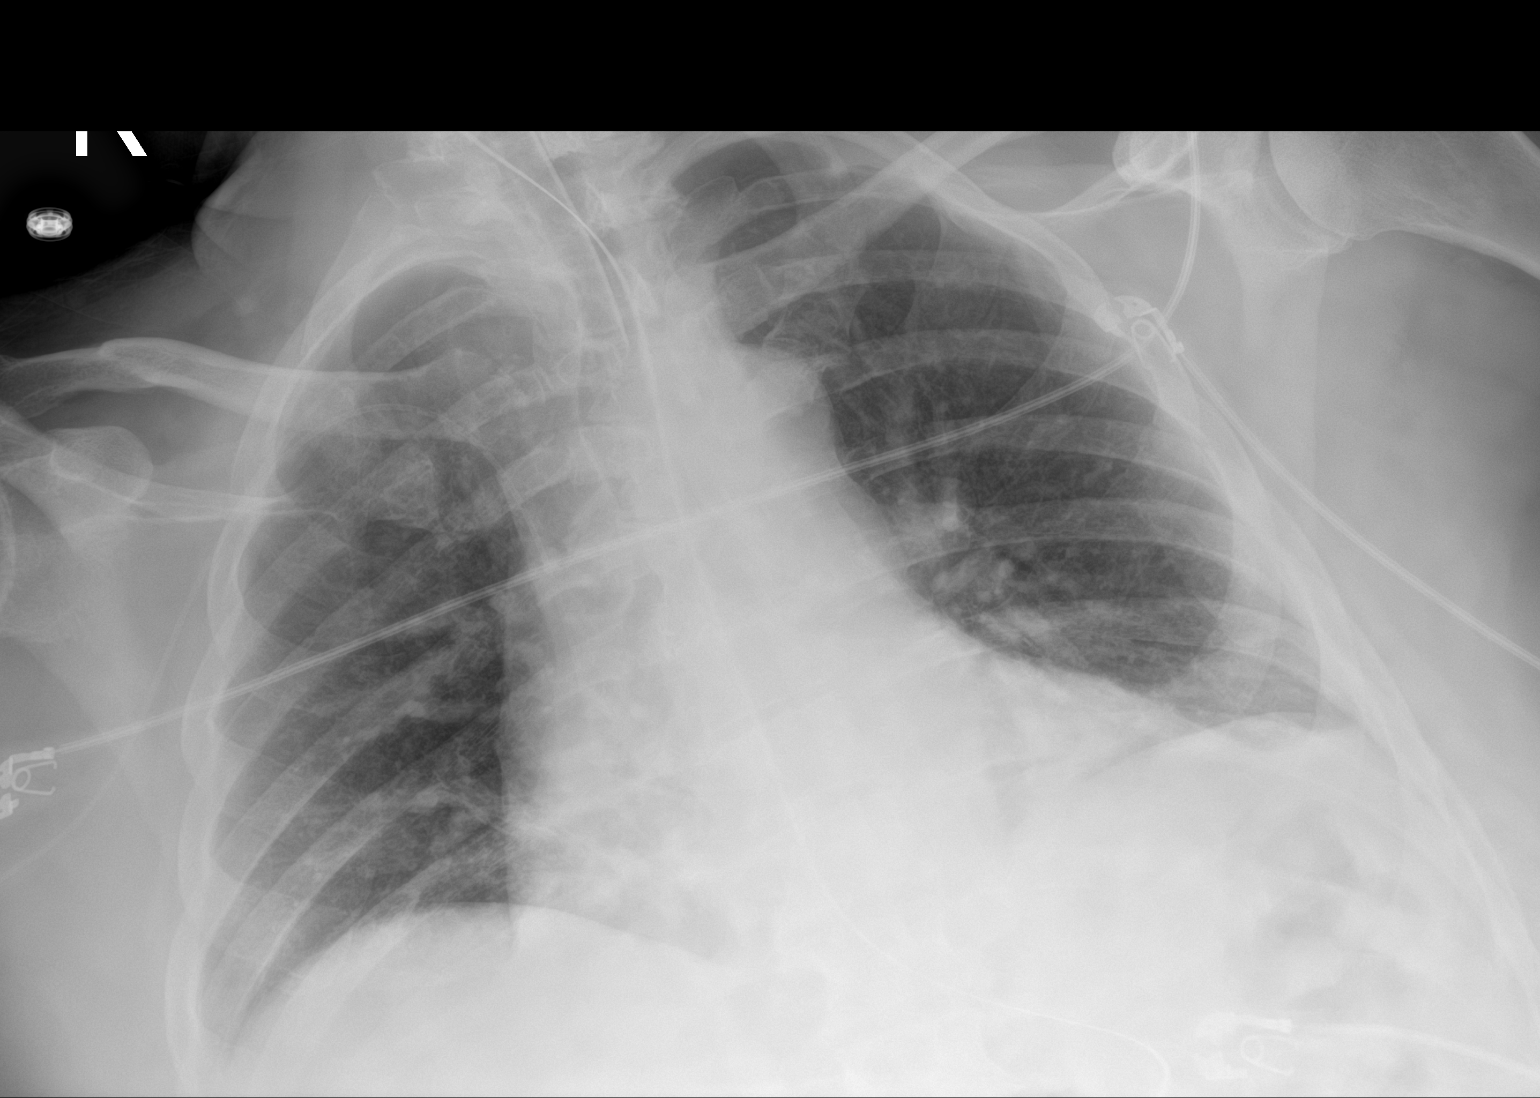

[2 of 2 positions shown; findings below may reference images not displayed]

FINDINGS: An NG tube terminates in the region of the proximal duodenum. A
right PICC line terminates near the caval atrial junction. An ETT is
in good position. No pneumothorax. Stable cardiomegaly. The hila and
mediastinum are unchanged. Mild atelectasis in the left base. No
overt edema or focal infiltrate. No nodule or mass.
IMPRESSION: 1. The NG tube terminates in the right side of the abdomen, probably
in the proximal right duodenum. Consider withdrawing 5 or 6 cm.
2. Other support apparatus as above.
3. No other acute abnormalities.

## 2021-05-04 MED ORDER — METOPROLOL TARTRATE 5 MG/5ML IV SOLN: 5.0000 mg | Freq: Once | INTRAVENOUS | Status: AC

## 2021-05-04 MED ORDER — METOPROLOL TARTRATE 5 MG/5ML IV SOLN
INTRAVENOUS | Status: AC
  Administered 2021-05-04: 5 mg via INTRAVENOUS
  Filled 2021-05-04: qty 5

## 2021-05-04 MED ORDER — AMLODIPINE BESYLATE 5 MG PO TABS
5.0000 mg | ORAL_TABLET | Freq: Every day | ORAL | Status: DC
  Administered 2021-05-04 – 2021-05-09 (×6): 5 mg
  Filled 2021-05-04 (×6): qty 1

## 2021-05-04 MED ORDER — LOSARTAN POTASSIUM 50 MG PO TABS
100.0000 mg | ORAL_TABLET | Freq: Every day | ORAL | Status: DC
  Administered 2021-05-04 – 2021-05-08 (×5): 100 mg
  Filled 2021-05-04 (×5): qty 2

## 2021-05-04 MED ORDER — SODIUM PHOSPHATES 45 MMOLE/15ML IV SOLN
30.0000 mmol | Freq: Once | INTRAVENOUS | Status: AC
  Administered 2021-05-04: 30 mmol via INTRAVENOUS
  Filled 2021-05-04: qty 10

## 2021-05-04 MED ORDER — METOPROLOL TARTRATE 25 MG/10 ML ORAL SUSPENSION
50.0000 mg | Freq: Two times a day (BID) | ORAL | Status: DC
  Administered 2021-05-04 – 2021-05-07 (×7): 50 mg
  Filled 2021-05-04 (×8): qty 20

## 2021-05-04 NOTE — Progress Notes (Signed)
NAME:  TEMITOPE GRIFFING, MRN:  [id number], DOB:  August 11, 1961, LOS: 4 ADMISSION DATE:  04/29/2021, CONSULTATION DATE:  05/04/21 REFERRING MD:  EDP, CHIEF COMPLAINT:  AMS   Brief summary:  60 year old male with sudden onset of headache and hearing loss  f/b AMS.  Recent return from 2 week trip to [place] early May.  Early CSF and MRI findings suggestive for bacterial meningitis.  ID consulted.    Pertinent  Medical History   has a past medical history of Allergy, Dry eyes, GERD (gastroesophageal reflux disease), Glaucoma, adenomatous colonic polyps (07/08/2006), Hypertension, Post-operative nausea and vomiting, Sleep apnea, and Thyroid disease.   Significant Hospital Events: Including procedures, antibiotic start and stop dates in addition to other pertinent events   . 5/17 presented to ED, intubated for agitation and airway protection . 5/18 PCCM consulted for admission, lumbar puncture completed, ID consulted.  EEG w/right hemispheric cortical dysfunction, no evidence of seizures.  Started on decadron; precedex off, remain on fentanyl gtt   Tubes/lines ETT 5/17 >> Foley 5/17 >> 5/19  Antibiotics: Ampicillin 5/17>> 5/19 Ceftriaxone 5/17 (2gm q 12hr) >> 5/21 Vancomycin 5/17 >> 5/20 Acyclovir 5/18 Penicillin cont Infusion 5/21 >>   Micro: 5/18 CSF culture and gram stain>> S. Pneumoniae >> pan sensitive 5/18 CSF fungal cx>>         CSF HSV >> 5/18 BCx >>   Interim History / Subjective:   A. fib/flutter last 24 hours, metoprolol added 5/21.  Anticoagulation deferred Episode of SVT this morning, heart rate 180, responded metoprolol I/O+ 7.6 L total Intermittent agitation as fentanyl adjusted Diastolic hypertension 5.05, PEEP 5 Fentanyl 100  Objective   Blood pressure (!) 143/84, pulse (!) 56, temperature 99.2 F (37.3 C), temperature source Oral, resp. rate 16, height 5' 10.98" (1.803 m), weight 114.1 kg, SpO2 94 %.    Vent Mode: PRVC FiO2 (%):  [40 %] 40 % Set Rate:  [16 bmp]  16 bmp Vt Set:  [600 mL] 600 mL PEEP:  [5 cmH20] 5 cmH20 Plateau Pressure:  [13 cmH20-14 cmH20] 14 cmH20   Intake/Output Summary (Last 24 hours) at 05/04/2021 0740 Last data filed at 05/04/2021 0600 Gross per 24 hour  Intake 2831.95 ml  Output 1450 ml  Net 1381.95 ml   Filed Weights   05/02/21 0400 05/03/21 0441 05/04/21 0420  Weight: 114.5 kg 114.3 kg 114.1 kg   General: Obese, ventilated, no discomfort HEENT: ET tube in good position, neck large, oropharynx clear, pupils equal Neuro: Eyes are open, may have followed some commands.  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 Johny Chess evaluation.  Some slow neurological improvement but still far from baseline -Seizure precautions -Wean sedating medications as able  Acute hypoxic respiratory failure -continue current MV support, 8 cc/kg.  Okay for PSV but does not have the mental status for safe extubation at this time -VAP prevention order set -PAD protocol for sedation.  Currently  on fentanyl.  May be a candidate to retry Precedex although he has had bradycardia through the hospitalization.  Atrial fibrillation/flutter.  Suspect secondary to stress state from his underlying illness -Schedule metoprolol started 5/21, plan to increase on  5/22 -No plans for anticoagulation currently given risk for possible ICH  Electrolyte imbalance -follow BMP -Replete electrolytes as indicated -May be in a position to initiate scheduled diuresis today's  Thrombocytopenia, likely due to sepsis Leukocytosis  -following CBC  History of hypertension -Plan to restart home amlodipine and losartan on 5/22  Best practice (right click and "Reselect all SmartList Selections" daily)  Diet:  Tube Feed ; TF  Pain/Anxiety/Delirium protocol (if indicated): Yes (RASS goal 0) VAP protocol (if indicated): Yes DVT prophylaxis: SCD  GI prophylaxis: PPI Glucose control:  SSI Yes Central venous access:  N/A Arterial line:  N/A Foley:  N/A;  Mobility:  bed rest  PT consulted: N/A Last date of multidisciplinary goals of care discussion [pending] Code Status:  full code Disposition: ICU  Wife updated at bedside on 5/22  Labs   CBC: Recent Labs  Lab 04/29/21 2234 04/29/21 2240 04/30/21 0735 05/01/21 0207 05/01/21 0436 05/02/21 0616 05/03/21 0456 05/04/21 0122  WBC 18.3*  --  18.9* 19.2*  --  17.1* 17.2* 16.4*  NEUTROABS 16.9*  --   --   --   --   --   --   --   HGB 18.7*   < > 17.4* 16.1 14.6 15.2 16.0 18.4*  HCT 55.4*   < > 51.5 47.3 43.0 46.6 48.9 55.5*  MCV 94.4  --  93.5 94.0  --  96.9 96.8 95.0  PLT 142*  --  145* PLATELET CLUMPS NOTED ON SMEAR, UNABLE TO ESTIMATE  --  107* 137* 148*   < > = values in this interval not displayed.    Basic Metabolic Panel: Recent Labs  Lab 04/30/21 0735 05/01/21 0207 05/01/21 0436 05/02/21 0616 05/03/21 0456 05/03/21 1742 05/04/21 0122  NA 138 141 143 141 144 145 143  K 3.9 3.9 3.8 4.7 4.2 4.6 4.5  CL 106 111  --  110 110 112* 109  CO2 23 23  --  25 28 27 28   GLUCOSE 127* 137*  --  162* 179* 194* 206*  BUN 18 24*  --  30* 28* 28* 26*  CREATININE 1.13 0.98  --  0.92 0.83 0.66 0.78  CALCIUM 8.5* 8.3*  --  8.0* 8.0* 7.9* 7.9*  MG 1.6* 2.5*  --   --   --  2.5* 2.6*  PHOS 3.3 2.8  --    --   --   --  1.7*   GFR: Estimated Creatinine Clearance: 124.5 mL/min (by C-G formula based on SCr of 0.78 mg/dL). Recent Labs  Lab 04/29/21 2354 04/30/21 0735 04/30/21 1213 05/01/21 0207 05/02/21 0616 05/03/21 0456 05/04/21 0122  WBC  --    < >  --  19.2* 17.1* 17.2* 16.4*  LATICACIDVEN 3.1*  --  2.8*  --   --   --   --    < > = values in this interval not displayed.    Liver Function Tests: Recent Labs  Lab 04/29/21 2234 05/01/21 0207  AST 32 19  ALT 27 24  ALKPHOS 50 37*  BILITOT 2.6* 0.4  PROT 7.0 6.1*  ALBUMIN 4.0 2.8*   No results for input(s): LIPASE, AMYLASE in the last 168 hours. No results for input(s): AMMONIA in the last 168 hours.  ABG    Component Value Date/Time   PHART 7.374 05/01/2021 0436   PCO2ART 43.0 05/01/2021 0436   PO2ART 105 05/01/2021 0436   HCO3 25.1 05/01/2021 0436   TCO2 26 05/01/2021 0436   ACIDBASEDEF 2.0 04/29/2021 2358   O2SAT 98.0 05/01/2021 0436     Coagulation Profile: Recent Labs  Lab 04/30/21 0027  INR 1.4*    Cardiac Enzymes: No results for input(s): CKTOTAL, CKMB, CKMBINDEX, TROPONINI in the last 168 hours.  HbA1C: Hgb A1c MFr Bld  Date/Time Value Ref Range Status  04/30/2021 07:39 AM 5.4 4.8 - 5.6 % Final    Comment:    (NOTE) Pre diabetes:          5.7%-6.4%  Diabetes:              >6.4%  Glycemic control for   <7.0% adults with diabetes     CBG: Recent Labs  Lab 05/03/21 1143 05/03/21 1549 05/03/21 1933 05/03/21 2336 05/04/21 0408  GLUCAP 179* 187* 195* 196* 210*    Independent CC time 32 minutes  Baltazar Apo, MD, PhD 05/04/2021, 7:40 AM [facility] [phone] or if no answer before 7:00PM call [phone] For any issues after 7:00PM please call eLink [phone]

## 2021-05-04 NOTE — Progress Notes (Signed)
Pharmacy Electrolyte Replacement  Recent Labs:  Recent Labs    05/04/21 0122  K 4.5  MG 2.6*  PHOS 1.7*  CREATININE 0.78    Low Critical Values (K </= 2.5, Phos </= 1, Mg </= 1) Present: None  Plan: Na Phos 30 x 1  Barth Kirks, PharmD, BCCCP Clinical Pharmacist [phone]  Please check AMION for all [facility] numbers  05/04/2021 8:59 AM

## 2021-05-04 NOTE — Progress Notes (Signed)
Neurology Progress Note   S:// Seen and examined CSF results consistent with bacterial meningitis, CSF culture growing gram-positive cocci with predominantly mononuclear WBCs. MRI brain also reviewed personally-consistent with meningitis. Appreciate ID consultation-and abx managament .  O:// Current vital signs: BP (!) 156/88   Pulse 71   Temp 99.8 F (37.7 C) (Oral)   Resp 19   Ht 5' 10.98" (1.803 m)   Wt 114.1 kg   SpO2 97%   BMI 35.10 kg/m  Vital signs in last 24 hours: Temp:  [99 F (37.2 C)-100.3 F (37.9 C)] 99.8 F (37.7 C) (05/22 0700) Pulse Rate:  [53-135] 71 (05/22 0750) Resp:  [10-29] 19 (05/22 0750) BP: (135-165)/(77-121) 156/88 (05/22 0750) SpO2:  [88 %-97 %] 97 % (05/22 0833) FiO2 (%):  [40 %] 40 % (05/22 0833) Weight:  [114.1 kg] 114.1 kg (05/22 0420) General: Sedated on fentanyl- intubated HEENT: Normocephalic atraumatic, moderate degree of neck stiffness while trying to flex his neck, much more supple on side to side movement. CVS: Regular rate rhythm Abdomen nondistended nontender Respiratory: Vented Neurological exam Currently intubated Sedated on fentanyl 100/h. Opens eyes spontaneously and also to voice. Appeared to have intermittently followed commands-closed eyes to command. Not moving extremities to command Pupils equal round reactive to light Breathing over the ventilator Brainstem reflexes present Off sedation becomes extremely agitated and moves or extremities. To noxious stimulation brisk withdrawal in all 4 extremities.  Somewhat more awake and questionably intermittently barely following commands which is very different from the past 2 days.   Medications  Current Facility-Administered Medications:  .  0.9 %  sodium chloride infusion, , Intravenous, PRN, Kipp Brood, MD, Last Rate: 41.7 mL/hr at 05/04/21 0300, Infusion Verify at 05/04/21 0300 .  acetaminophen (TYLENOL) 160 MG/5ML solution 650 mg, 650 mg, Per Tube, Q6H PRN,  Anders Simmonds, MD, 650 mg at 05/02/21 0137 .  amLODipine (NORVASC) tablet 5 mg, 5 mg, Per Tube, Daily, Byrum, Rose Fillers, MD, 5 mg at 05/04/21 0820 .  chlorhexidine gluconate (MEDLINE KIT) (PERIDEX) 0.12 % solution 15 mL, 15 mL, Mouth Rinse, BID, Agarwala, Ravi, MD, 15 mL at 05/04/21 0829 .  Chlorhexidine Gluconate Cloth 2 % PADS 6 each, 6 each, Topical, Daily, Kipp Brood, MD, 6 each at 05/03/21 2130 .  dexamethasone (DECADRON) injection 10 mg, 10 mg, Intravenous, Q6H, Amie Portland, MD, 10 mg at 05/04/21 0424 .  docusate (COLACE) 50 MG/5ML liquid 100 mg, 100 mg, Per Tube, BID PRN, Jacky Kindle, MD, 100 mg at 05/03/21 1842 .  feeding supplement (PROSource TF) liquid 45 mL, 45 mL, Per Tube, TID, Jacky Kindle, MD, 45 mL at 05/04/21 0820 .  feeding supplement (VITAL AF 1.2 CAL) liquid 1,000 mL, 1,000 mL, Per Tube, Continuous, Chand, Sudham, MD, Last Rate: 65 mL/hr at 05/04/21 0239, 1,000 mL at 05/04/21 0239 .  fentaNYL (SUBLIMAZE) bolus via infusion 50-100 mcg, 50-100 mcg, Intravenous, Q15 min PRN, Gleason, Otilio Carpen, PA-C, 100 mcg at 05/04/21 0417 .  fentaNYL (SUBLIMAZE) injection 50 mcg, 50 mcg, Intravenous, Once, Gleason, Otilio Carpen, PA-C .  fentaNYL 2573mcg in NS 225mL (37mcg/ml) infusion-PREMIX, 50-200 mcg/hr, Intravenous, Continuous, Gleason, Otilio Carpen, PA-C, Last Rate: 10 mL/hr at 05/04/21 0500, 100 mcg/hr at 05/04/21 0500 .  insulin aspart (novoLOG) injection 0-15 Units, 0-15 Units, Subcutaneous, Q4H, Gleason, Otilio Carpen, PA-C, 3 Units at 05/04/21 0820 .  LORazepam (ATIVAN) injection 2 mg, 2 mg, Intravenous, Q6H PRN, Jacky Kindle, MD, 2 mg at 05/02/21 0837 .  losartan (COZAAR) tablet 100  mg, 100 mg, Per Tube, Daily, Collene Gobble, MD, 100 mg at 05/04/21 0820 .  MEDLINE mouth rinse, 15 mL, Mouth Rinse, 10 times per day, Agarwala, Ravi, MD, 15 mL at 05/04/21 0600 .  metoprolol tartrate (LOPRESSOR) 25 mg/10 mL oral suspension 25 mg, 25 mg, Per Tube, BID, Collene Gobble, MD, 25 mg at 05/03/21  1834 .  mupirocin ointment (BACTROBAN) 2 % 1 application, 1 application, Nasal, BID, Kipp Brood, MD, 1 application at 32/76/14 0819 .  pantoprazole sodium (PROTONIX) 40 mg/20 mL oral suspension 40 mg, 40 mg, Per Tube, Daily, Jacky Kindle, MD, 40 mg at 05/04/21 0820 .  penicillin G potassium 12 Million Units in dextrose 5 % 500 mL continuous infusion, 12 Million Units, Intravenous, Q12H, Thayer Headings, MD, Last Rate: 41.7 mL/hr at 05/03/21 2348, 12 Million Units at 05/03/21 2348 .  polyethylene glycol (MIRALAX / GLYCOLAX) packet 17 g, 17 g, Per Tube, Daily PRN, Jennelle Human B, NP .  QUEtiapine (SEROQUEL) tablet 50 mg, 50 mg, Per Tube, BID, Jennelle Human B, NP, 50 mg at 05/04/21 0927 .  sodium chloride flush (NS) 0.9 % injection 10-40 mL, 10-40 mL, Intracatheter, Q12H, Byrum, Rose Fillers, MD, 10 mL at 05/03/21 1030 .  sodium chloride flush (NS) 0.9 % injection 10-40 mL, 10-40 mL, Intracatheter, PRN, Collene Gobble, MD .  sodium phosphate 30 mmol in dextrose 5 % 250 mL infusion, 30 mmol, Intravenous, Once, Collene Gobble, MD Labs CBC    Component Value Date/Time   WBC 16.4 (H) 05/04/2021 0122   RBC 5.84 (H) 05/04/2021 0122   HGB 18.4 (H) 05/04/2021 0122   HCT 55.5 (H) 05/04/2021 0122   PLT 148 (L) 05/04/2021 0122   MCV 95.0 05/04/2021 0122   MCH 31.5 05/04/2021 0122   MCHC 33.2 05/04/2021 0122   RDW 13.7 05/04/2021 0122   LYMPHSABS 0.7 04/29/2021 2234   MONOABS 0.5 04/29/2021 2234   EOSABS 0.1 04/29/2021 2234   BASOSABS 0.0 04/29/2021 2234    CMP     Component Value Date/Time   NA 143 05/04/2021 0122   K 4.5 05/04/2021 0122   CL 109 05/04/2021 0122   CO2 28 05/04/2021 0122   GLUCOSE 206 (H) 05/04/2021 0122   BUN 26 (H) 05/04/2021 0122   CREATININE 0.78 05/04/2021 0122   CALCIUM 7.9 (L) 05/04/2021 0122   PROT 6.1 (L) 05/01/2021 0207   ALBUMIN 2.8 (L) 05/01/2021 0207   AST 19 05/01/2021 0207   ALT 24 05/01/2021 0207   ALKPHOS 37 (L) 05/01/2021 0207   BILITOT 0.4  05/01/2021 0207   GFRNONAA >60 05/04/2021 0122   CSF analysis CSF tube 1 RBC 91, WBC 585 with 99% segmented neutrophils, yellow CSF, xanthochromic.  Tube 3 RBC count 110, WBC 510, 99% segmented neutrophils. CSF protein greater than 600 CSF glucose less than 20 General CSF appearance: Cloudy  Other infectious disease results: Crypto antigen negative COVID-19 negative HIV negative  CSF organism-Streptococcus pneumonia-pansensitive.  Routine EEG 04/30/2020 with right hemispheric cortical dysfunction.  No evidence of seizures.  Imaging I have reviewed images in epic and the results pertinent to this consultation are: MRI of the brain with leptomeningeal enhancement.  Assessment:  60 year old with sudden onset of altered mental status with a preceding headache, with CSF and MRI findings suggestive of bacterial meningitis currently on empiric antibiotic coverage. Densely encephalopathic over the past few days with trace improvement in exam today.  Able to open eyes to voice and also questionably closes  eyes to command.  Does not follow commands in extremities.   Impression: Bacterial meningitis-mentation mildly improved today compared to the prior few days.  Recommendations: Abx per ID-ceftriaxone 2 g IV every 12 hours-till he got IV access and then on penicillin continuous infusion. C/W Steroids for four days total Ventilatory support per PCCM ID consultation appreciated Minimize sedation as tolerated-try Precedex.  Exam with trace improvement.  Will defer further imaging for now.  If does not improve in the next day or so or worsens, would like to repeat an EEG and also an MRI brain with and without contrast.  D/W Dr Lamonte Sakai  Updated his wife at bedside  -- Amie Portland, MD Neurologist [facility] Pager: [phone]

## 2021-05-05 ENCOUNTER — Inpatient Hospital Stay (HOSPITAL_COMMUNITY): Payer: Commercial Managed Care - PPO

## 2021-05-05 DIAGNOSIS — G001 Pneumococcal meningitis: Secondary | ICD-10-CM

## 2021-05-05 DIAGNOSIS — R4182 Altered mental status, unspecified: Secondary | ICD-10-CM | POA: Diagnosis not present

## 2021-05-05 DIAGNOSIS — G039 Meningitis, unspecified: Secondary | ICD-10-CM | POA: Diagnosis not present

## 2021-05-05 DIAGNOSIS — J9601 Acute respiratory failure with hypoxia: Secondary | ICD-10-CM | POA: Diagnosis not present

## 2021-05-05 DIAGNOSIS — R410 Disorientation, unspecified: Secondary | ICD-10-CM | POA: Diagnosis not present

## 2021-05-05 DIAGNOSIS — Z95828 Presence of other vascular implants and grafts: Secondary | ICD-10-CM | POA: Diagnosis not present

## 2021-05-05 LAB — CULTURE, BLOOD (ROUTINE X 2)
Culture: NO GROWTH
Culture: NO GROWTH
Special Requests: ADEQUATE

## 2021-05-05 LAB — COMPREHENSIVE METABOLIC PANEL
ALT: 133 U/L — ABNORMAL HIGH (ref 0–44)
AST: 43 U/L — ABNORMAL HIGH (ref 15–41)
Albumin: 2.1 g/dL — ABNORMAL LOW (ref 3.5–5.0)
Alkaline Phosphatase: 44 U/L (ref 38–126)
Anion gap: 7 (ref 5–15)
BUN: 30 mg/dL — ABNORMAL HIGH (ref 8–23)
CO2: 28 mmol/L (ref 22–32)
Calcium: 7.5 mg/dL — ABNORMAL LOW (ref 8.9–10.3)
Chloride: 109 mmol/L (ref 98–111)
Creatinine, Ser: 0.79 mg/dL (ref 0.61–1.24)
GFR, Estimated: >60 mL/min (ref >60)
Glucose, Bld: 201 mg/dL — ABNORMAL HIGH (ref 70–99)
Potassium: 4.3 mmol/L (ref 3.5–5.1)
Sodium: 144 mmol/L (ref 135–145)
Total Bilirubin: 0.6 mg/dL (ref 0.3–1.2)
Total Protein: 5.4 g/dL — ABNORMAL LOW (ref 6.5–8.1)

## 2021-05-05 LAB — CBC
HCT: 59.1 % — ABNORMAL HIGH (ref 39.0–52.0)
Hemoglobin: 19.2 g/dL — ABNORMAL HIGH (ref 13.0–17.0)
MCH: 31.2 pg (ref 26.0–34.0)
MCHC: 32.5 g/dL (ref 30.0–36.0)
MCV: 95.9 fL (ref 80.0–100.0)
Platelets: 184 10*3/uL (ref 150–400)
RBC: 6.16 MIL/uL — ABNORMAL HIGH (ref 4.22–5.81)
RDW: 13.5 % (ref 11.5–15.5)
WBC: 18.1 10*3/uL — ABNORMAL HIGH (ref 4.0–10.5)
nRBC: 0.1 % (ref 0.0–0.2)

## 2021-05-05 LAB — GLUCOSE, CAPILLARY
Glucose-Capillary: 210 mg/dL — ABNORMAL HIGH (ref 70–99)
Glucose-Capillary: 201 mg/dL — ABNORMAL HIGH (ref 70–99)
Glucose-Capillary: 197 mg/dL — ABNORMAL HIGH (ref 70–99)
Glucose-Capillary: 172 mg/dL — ABNORMAL HIGH (ref 70–99)
Glucose-Capillary: 189 mg/dL — ABNORMAL HIGH (ref 70–99)
Glucose-Capillary: 196 mg/dL — ABNORMAL HIGH (ref 70–99)

## 2021-05-05 LAB — PHOSPHORUS: Phosphorus: 2.4 mg/dL — ABNORMAL LOW (ref 2.5–4.6)

## 2021-05-05 IMAGING — CT CT HEAD W/O CM
3 series · 17 of 37 positions shown, 19 images · non-contrast
Comparison: Recent MR and CT imaging

CLINICAL DATA: Bacterial meningitis

EXAM:
CT HEAD WITHOUT CONTRAST
TECHNIQUE: Contiguous axial images were obtained from the base of the skull
through the vertex without intravenous contrast.

[Series 5: head without · axial · non-contrast · 0.49mm/px · z∈[-19,+116]mm · 7 of 37 slices shown, 9 images]
[im 5/37  brain]
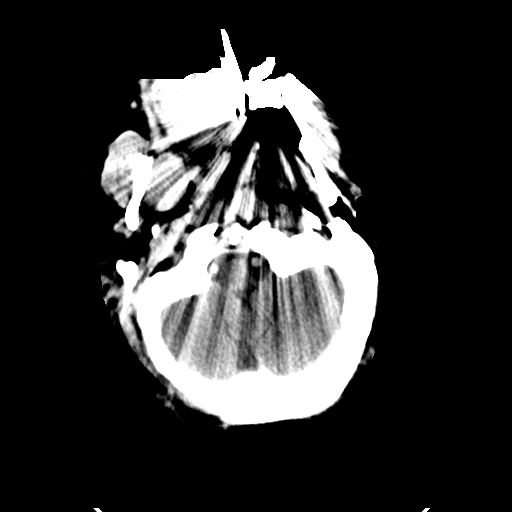
[im 5/37  bone]
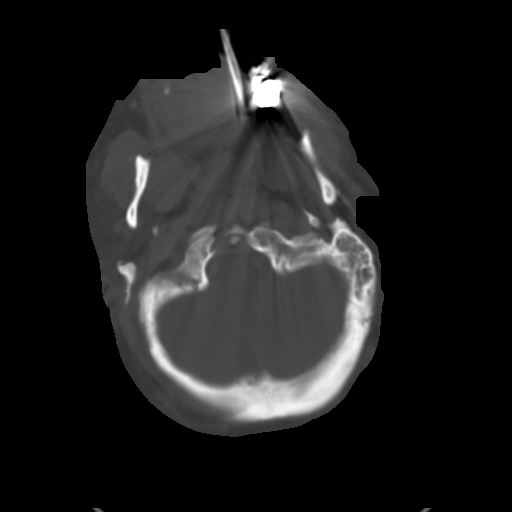
[im 10/37  brain]
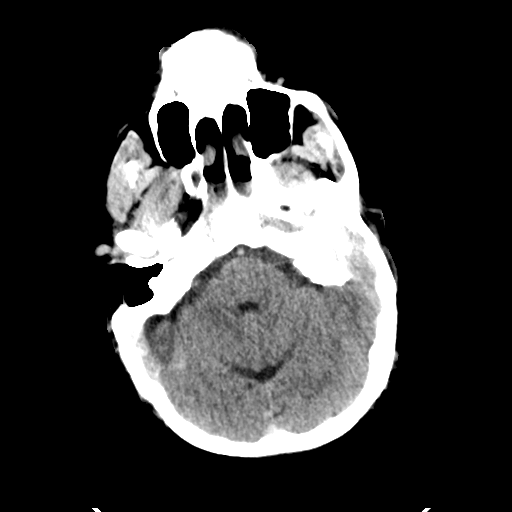
[im 14/37  brain]
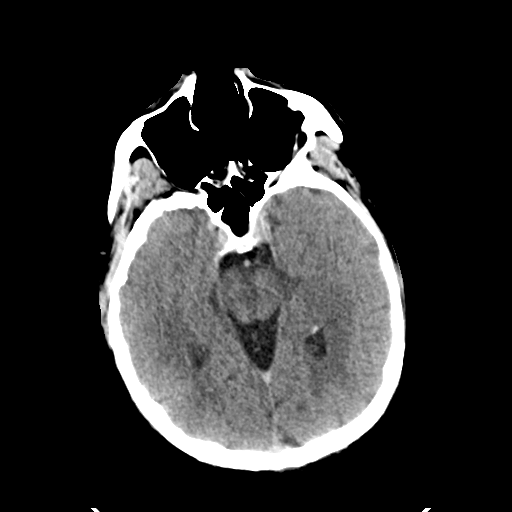
[im 19/37  brain]
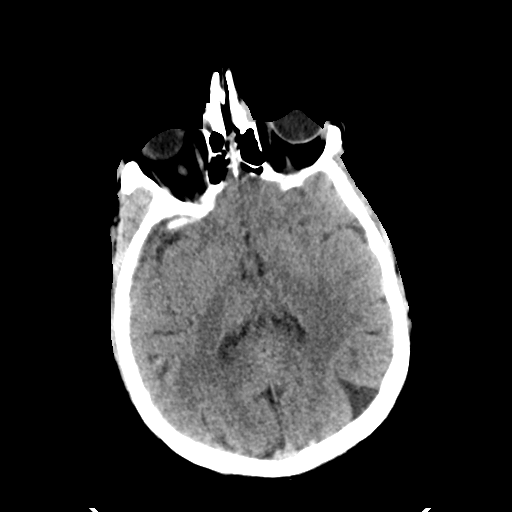
[im 23/37  brain]
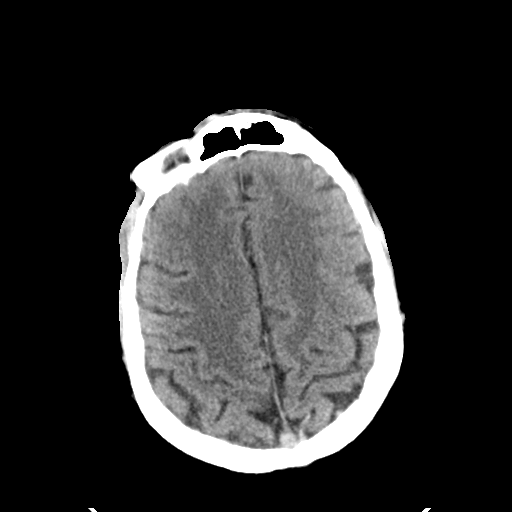
[im 23/37  bone]
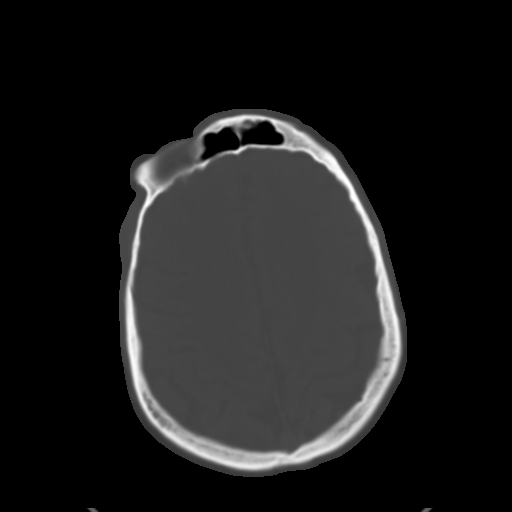
[im 28/37  brain]
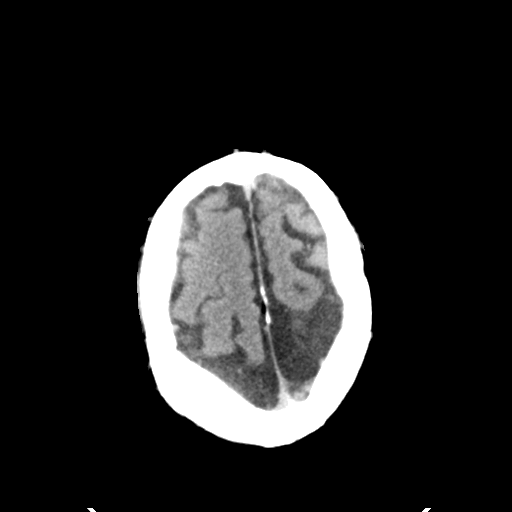
[im 32/37  brain]
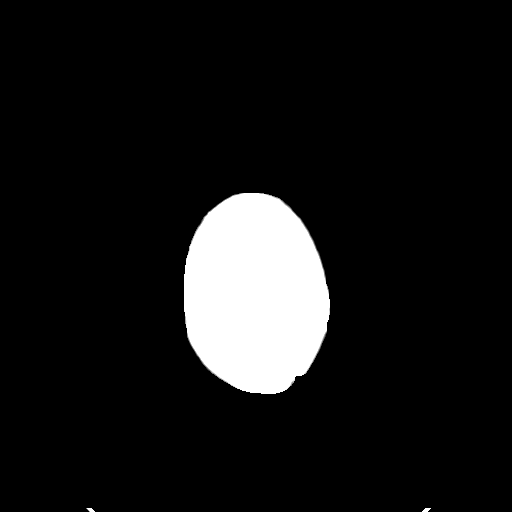

[Series 6: head bone · axial · 0.49mm/px · z∈[-21,+105]mm · 7 of 91 slices shown]
[im 10/91  bone]
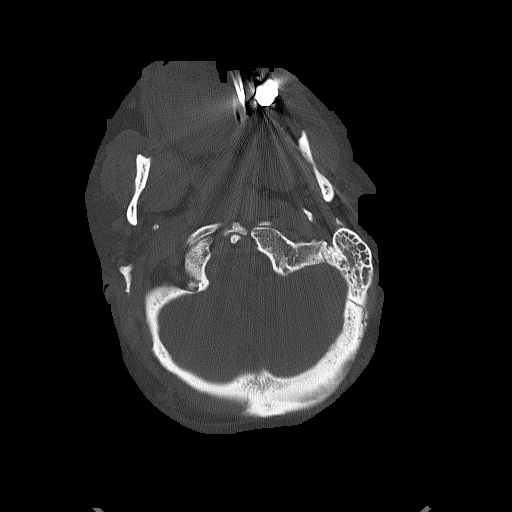
[im 19/91  bone]
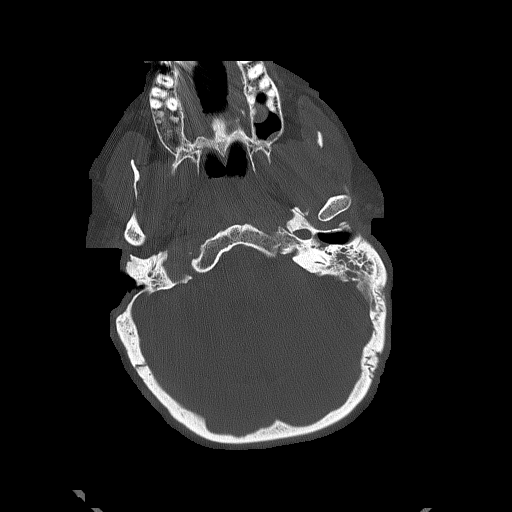
[im 28/91  bone]
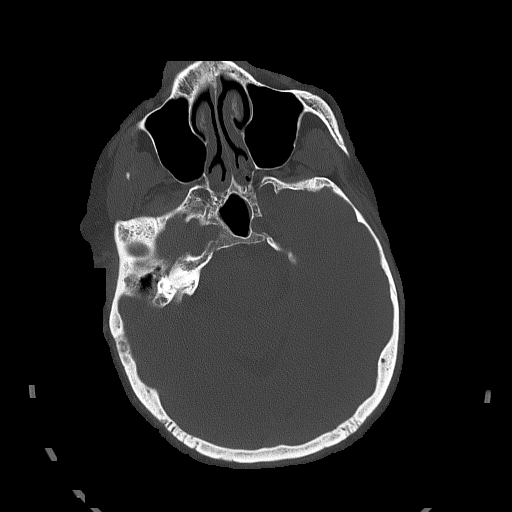
[im 41/91  bone]
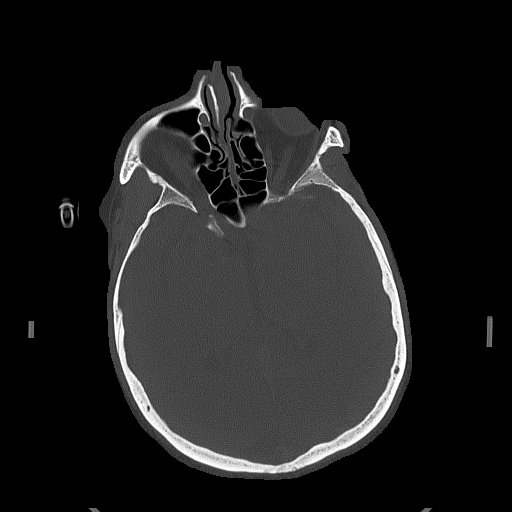
[im 50/91  bone]
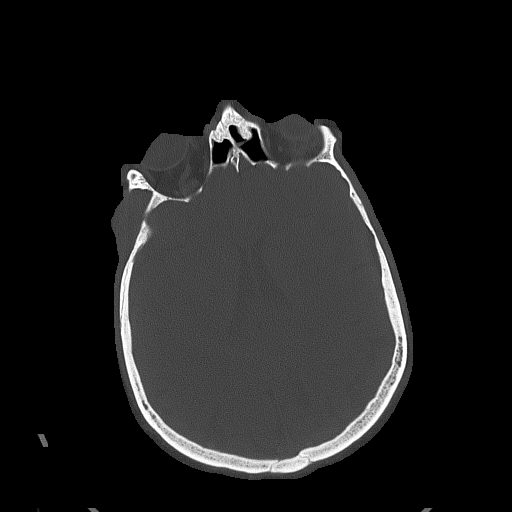
[im 64/91  bone]
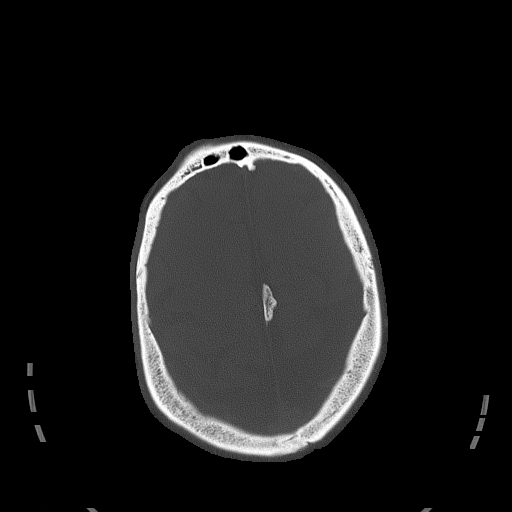
[im 73/91  bone]
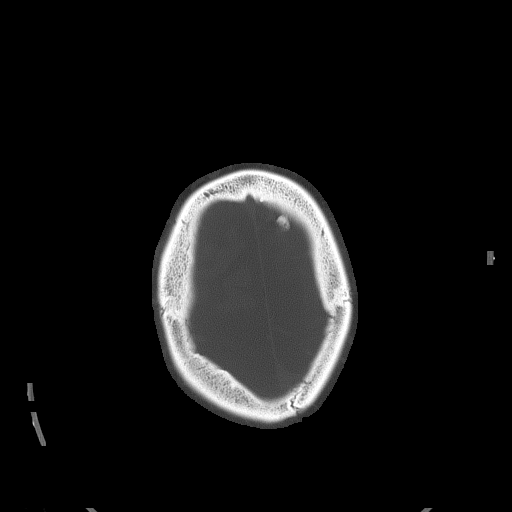

[Series 8: head without sag · sagittal · non-contrast · 0.32mm/px · 3 of 63 slices shown]
[im 21/63  brain]
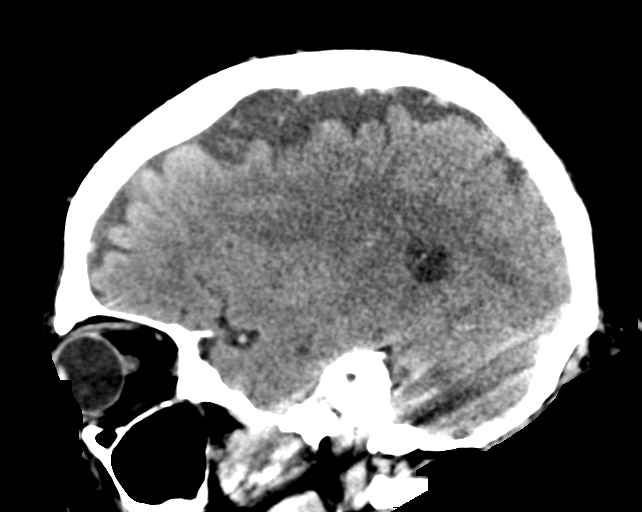
[im 32/63  brain]
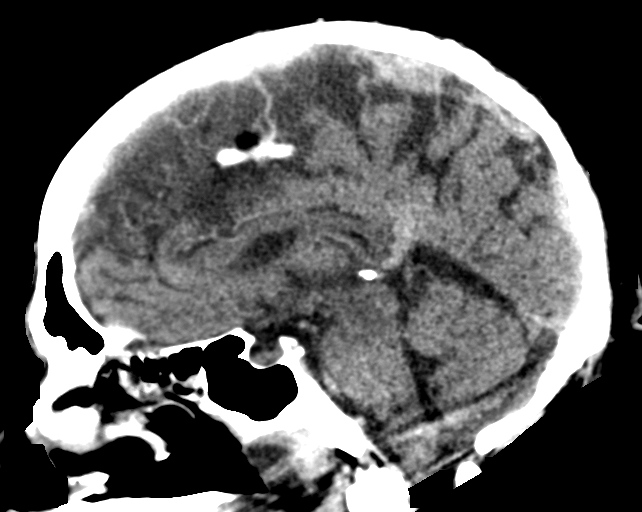
[im 42/63  brain]
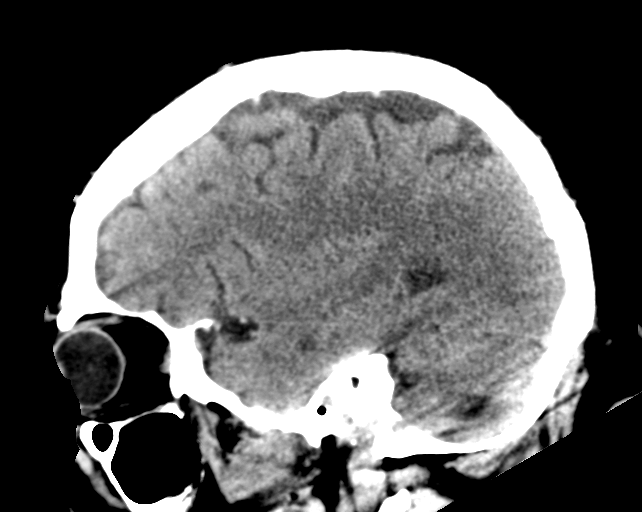

[17 of 37 positions shown; findings below may reference images not displayed]

FINDINGS: There is significant streak artifact through the posterior fossa and
skull base.

Brain: There is no acute intracranial hemorrhage, mass effect, or
edema. Gray-white differentiation is preserved. There is no
extra-axial fluid collection. Ventricles and sulci are stable in
size and configuration.

Vascular: Increased density of some of the dural venous sinuses.

Skull: Calvarium is unremarkable.

Sinuses/Orbits: No acute finding.

Other: None.
IMPRESSION: No acute intracranial hemorrhage, acute infarction, extra-axial
collection, or hydrocephalus.

There is increased density of some of the dural venous sinuses. This
more likely reflects hemoconcentration than thrombus. MRV could be
considered as indicated.

## 2021-05-05 MED ORDER — FUROSEMIDE 10 MG/ML IJ SOLN
40.0000 mg | Freq: Two times a day (BID) | INTRAMUSCULAR | Status: AC
  Administered 2021-05-05 (×2): 40 mg via INTRAVENOUS
  Filled 2021-05-05 (×2): qty 4

## 2021-05-05 MED ORDER — HEPARIN SODIUM (PORCINE) 5000 UNIT/ML IJ SOLN
5000.0000 [IU] | Freq: Three times a day (TID) | INTRAMUSCULAR | Status: DC
  Administered 2021-05-05 – 2021-05-19 (×42): 5000 [IU] via SUBCUTANEOUS
  Filled 2021-05-05 (×42): qty 1

## 2021-05-05 MED ORDER — K PHOS MONO-SOD PHOS DI & MONO 155-852-130 MG PO TABS
500.0000 mg | ORAL_TABLET | Freq: Two times a day (BID) | ORAL | Status: AC
  Administered 2021-05-05 (×2): 500 mg
  Filled 2021-05-05 (×2): qty 2

## 2021-05-05 NOTE — Progress Notes (Signed)
Transport pt on vent to CT and back to room. No noted respiratory issues at this time.

## 2021-05-05 NOTE — Progress Notes (Signed)
Subjective: Slightly more awake tahn previously described.   Exam: Vitals:   05/05/21 0700 05/05/21 0800  BP: (!) 159/90 (!) 158/83  Pulse: 60 61  Resp: 18 20  Temp: 100 F (37.8 C)   SpO2: 94% 94%   Gen: In bed, NAD Resp: non-labored breathing, no acute distress Abd: soft, nt  Neuro: MS: opens eyes spontaneously, fixates, but does not reliably track. Follows commands to squeeze hand and wiggle toes, but more reliably on the righ tthan left.  EK:CMKLK, Does not blink to threat Motor: he moves both arms and right leg spontaneously. Appears to have less movement in the left leg compared to right but does withdraw it.  Sensory:responds to nox stim x 4.   Pertinent Labs: 5/18 -  CSF Glucose < 20 CSF protein > 600 CSF WBC 510 CSF RBC 110  Impression: 60 yo M with strep pneumo meningitis. His is exam is ? Focal today with less movement on the elft and therefore I would like to repeat a head CT< but overall his exam appears to be improving as would be expected with response to antibiotics. I would expect slow continued improvement in mental status.   Recommendations: 1)CT head 2) abx per ID 3) Will follow.   Roland Rack, MD [facility] [phone]  If 7pm- 7am, please page neurology on call as listed in [place].

## 2021-05-05 NOTE — Progress Notes (Addendum)
NAME:  Hayden Mcbride, MRN:  [id number], DOB:  1961-11-26, LOS: 5 ADMISSION DATE:  04/29/2021, CONSULTATION DATE:  05/05/21 REFERRING MD:  EDP, CHIEF COMPLAINT:  AMS   Brief summary:  60 year old male with sudden onset of headache and hearing loss  f/b AMS.  Recent return from 2 week trip to [place] early May.  Early CSF and MRI findings suggestive for bacterial meningitis.  ID consulted.    Pertinent  Medical History   has a past medical history of Allergy, Dry eyes, GERD (gastroesophageal reflux disease), Glaucoma, adenomatous colonic polyps (07/08/2006), Hypertension, Post-operative nausea and vomiting, Sleep apnea, and Thyroid disease.   Significant Hospital Events: Including procedures, antibiotic start and stop dates in addition to other pertinent events   . 5/17 presented to ED, intubated for agitation and airway protection . 5/18 PCCM consulted for admission, lumbar puncture completed, ID consulted.  EEG w/right hemispheric cortical dysfunction, no evidence of seizures.  Started on decadron; precedex off, remain on fentanyl gtt   Tubes/lines ETT 5/17 >> Foley 5/17 >> 5/19  Antibiotics: Ampicillin 5/17>> 5/19 Ceftriaxone 5/17 (2gm q 12hr) >> 5/21 Vancomycin 5/17 >> 5/20 Acyclovir 5/18 Penicillin cont Infusion 5/21 >>   Micro: 5/18 CSF culture and gram stain>> S. Pneumoniae >> pan sensitive 5/18 CSF fungal cx>>         CSF HSV >> negative 5/18 BCx >> negative   Interim History / Subjective:   Converted to sinus rhythm I/O+ 8.9 L total Blood cultures negative as above  Objective   Blood pressure (!) 144/78, pulse 70, temperature 100 F (37.8 C), temperature source Axillary, resp. rate 18, height 5' 10.98" (1.803 m), weight 114 kg, SpO2 95 %.    Vent Mode: PRVC FiO2 (%):  [40 %] 40 % Set Rate:  [16 bmp] 16 bmp Vt Set:  [600 mL] 600 mL PEEP:  [5 cmH20] 5 cmH20 Plateau Pressure:  [12 cmH20-13 cmH20] 13 cmH20   Intake/Output Summary (Last 24 hours) at 05/05/2021  7425 Last data filed at 05/05/2021 9563 Gross per 24 hour  Intake 3222.88 ml  Output 2275 ml  Net 947.88 ml   Filed Weights   05/03/21 0441 05/04/21 0420 05/05/21 0500  Weight: 114.3 kg 114.1 kg 114 kg   General: Obese man, ventilated, no distress HEENT: ET tube in good position, oropharynx clear, pupils equal Neuro: Opens eyes to voice, appears to track.  Attempted to close eyes tightly on command.  Moved his right toes on command, not the left CV: Regular, distant, heart rate 65, no murmur PULM: Decreased at both bases, no wheeze or crackles GI: Obese, nondistended, positive bowel sounds Extremities: No edema Skin: No rash  Labs/imaging that I havepersonally reviewed  (right click and "Reselect all SmartList Selections" daily)   All labs and imaging reviewed  Resolved Hospital Problem list   Lactic acidosis Erythrocytosis Bradycardia  Assessment & Plan:   pneumococcal meningitis with encephalitis, encephalopathy -Appreciate neurology evaluation.  Overall some improvement in wakefulness and ability to follow commands but there may be some focality as he is able to do more on the right than on the left.  Head CT ordered 5/23 -Continue penicillin infusion.  Appreciate ID management -Completed 4 days dexamethasone -Seizure precautions -Minimize sedating medications if possible.  May be able to add Precedex to decrease fentanyl now that is heart rate is better managed, not in atrial fibrillation  Severe Sepsis due to pneumococcal meningitis, onset at admission 5/18 Associated Septic shock, on pressors 5/18 -  5/19 -hemodynamics improved  Acute hypoxic respiratory failure -continue current MV support at 8 cc/kg.  Okay for pressure support but defer extubation until his mental status is improved -VAP prevention orders -PAD protocol for sedation, currently on fentanyl, considering Precedex as above  Atrial fibrillation/flutter.  Suspect secondary to stress state from his  underlying illness.  Back in sinus rhythm 5/23 -Continue scheduled metoprolol -No plans for anticoagulation given risk for possible ICH in the setting of his pneumococcal meningitis  Electrolyte imbalance -follow BMP -Replete electrolytes as indicated -Lasix today 5/23, consider scheduled diuretics if tolerated going forward  Thrombocytopenia, likely due to sepsis, improving Leukocytosis  -follow CBC  History of hypertension -Continue home amlodipine, losartan in addition to metoprolol as above   Best practice (right click and "Reselect all SmartList Selections" daily)  Diet:  Tube Feed ; TF  Pain/Anxiety/Delirium protocol (if indicated): Yes (RASS goal 0) VAP protocol (if indicated): Yes DVT prophylaxis: SCD  GI prophylaxis: PPI Glucose control:  SSI Yes Central venous access:  N/A Arterial line:  N/A Foley:  N/A;  Mobility:  bed rest  PT consulted: N/A Last date of multidisciplinary goals of care discussion [pending] Code Status:  full code Disposition: ICU  Wife updated at bedside on 5/23  Labs   CBC: Recent Labs  Lab 04/29/21 2234 04/29/21 2240 05/01/21 0207 05/01/21 0436 05/02/21 0616 05/03/21 0456 05/04/21 0122 05/05/21 0215  WBC 18.3*   < > 19.2*  --  17.1* 17.2* 16.4* 18.1*  NEUTROABS 16.9*  --   --   --   --   --   --   --   HGB 18.7*   < > 16.1 14.6 15.2 16.0 18.4* 19.2*  HCT 55.4*   < > 47.3 43.0 46.6 48.9 55.5* 59.1*  MCV 94.4   < > 94.0  --  96.9 96.8 95.0 95.9  PLT 142*   < > PLATELET CLUMPS NOTED ON SMEAR, UNABLE TO ESTIMATE  --  107* 137* 148* 184   < > = values in this interval not displayed.    Basic Metabolic Panel: Recent Labs  Lab 04/30/21 0735 05/01/21 0207 05/01/21 0436 05/02/21 0616 05/03/21 0456 05/03/21 1742 05/04/21 0122 05/05/21 0215  NA 138 141   < > 141 144 145 143 144  K 3.9 3.9   < > 4.7 4.2 4.6 4.5 4.3  CL 106 111  --  110 110 112* 109 109  CO2 23 23  --  25 28 27 28 28   GLUCOSE 127* 137*  --  162* 179* 194* 206*  201*  BUN 18 24*  --  30* 28* 28* 26* 30*  CREATININE 1.13 0.98  --  0.92 0.83 0.66 0.78 0.79  CALCIUM 8.5* 8.3*  --  8.0* 8.0* 7.9* 7.9* 7.5*  MG 1.6* 2.5*  --   --   --  2.5* 2.6*  --   PHOS 3.3 2.8  --   --   --   --  1.7*  --    < > = values in this interval not displayed.   GFR: Estimated Creatinine Clearance: 124.5 mL/min (by C-G formula based on SCr of 0.79 mg/dL). Recent Labs  Lab 04/29/21 2354 04/30/21 0735 04/30/21 1213 05/01/21 0207 05/02/21 0616 05/03/21 0456 05/04/21 0122 05/05/21 0215  WBC  --    < >  --    < > 17.1* 17.2* 16.4* 18.1*  LATICACIDVEN 3.1*  --  2.8*  --   --   --   --   --    < > =  values in this interval not displayed.    Liver Function Tests: Recent Labs  Lab 04/29/21 2234 05/01/21 0207 05/05/21 0215  AST 32 19 43*  ALT 27 24 133*  ALKPHOS 50 37* 44  BILITOT 2.6* 0.4 0.6  PROT 7.0 6.1* 5.4*  ALBUMIN 4.0 2.8* 2.1*   No results for input(s): LIPASE, AMYLASE in the last 168 hours. No results for input(s): AMMONIA in the last 168 hours.  ABG    Component Value Date/Time   PHART 7.374 05/01/2021 0436   PCO2ART 43.0 05/01/2021 0436   PO2ART 105 05/01/2021 0436   HCO3 25.1 05/01/2021 0436   TCO2 26 05/01/2021 0436   ACIDBASEDEF 2.0 04/29/2021 2358   O2SAT 98.0 05/01/2021 0436     Coagulation Profile: Recent Labs  Lab 04/30/21 0027  INR 1.4*    Cardiac Enzymes: No results for input(s): CKTOTAL, CKMB, CKMBINDEX, TROPONINI in the last 168 hours.  HbA1C: Hgb A1c MFr Bld  Date/Time Value Ref Range Status  04/30/2021 07:39 AM 5.4 4.8 - 5.6 % Final    Comment:    (NOTE) Pre diabetes:          5.7%-6.4%  Diabetes:              >6.4%  Glycemic control for   <7.0% adults with diabetes     CBG: Recent Labs  Lab 05/04/21 1530 05/04/21 1947 05/04/21 2340 05/05/21 0327 05/05/21 0722  GLUCAP 211* 192* 195* 210* 201*    Independent CC time 33 minutes  Baltazar Apo, MD, PhD 05/05/2021, 9:27 AM [facility] [phone] or if no answer before 7:00PM call [phone] For any issues after 7:00PM please call eLink [phone]

## 2021-05-05 NOTE — Progress Notes (Signed)
Subjective: On ventilator   Antibiotics:  Anti-infectives (From admission, onward)   Start     Dose/Rate Route Frequency Ordered Stop   05/03/21 1215  penicillin G potassium 12 Million Units in dextrose 5 % 500 mL continuous infusion        12 Million Units 41.7 mL/hr over 12 Hours Intravenous Every 12 hours 05/03/21 1123     05/01/21 2200  vancomycin (VANCOREADY) IVPB 1000 mg/200 mL  Status:  Discontinued        1,000 mg 200 mL/hr over 60 Minutes Intravenous Every 8 hours 05/01/21 1312 05/02/21 0902   05/01/21 1400  vancomycin (VANCOREADY) IVPB 1000 mg/200 mL        1,000 mg 200 mL/hr over 60 Minutes Intravenous NOW 05/01/21 1312 05/01/21 1523   04/30/21 1200  vancomycin (VANCOREADY) IVPB 1000 mg/200 mL  Status:  Discontinued        1,000 mg 200 mL/hr over 60 Minutes Intravenous Every 12 hours 04/29/21 2334 05/01/21 1312   04/30/21 0600  acyclovir (ZOVIRAX) 1,000 mg in dextrose 5 % 150 mL IVPB  Status:  Discontinued        1,000 mg 170 mL/hr over 60 Minutes Intravenous Every 8 hours 04/30/21 0243 04/30/21 1119   04/30/21 0000  ampicillin (OMNIPEN) 2 g in sodium chloride 0.9 % 100 mL IVPB  Status:  Discontinued        2 g 300 mL/hr over 20 Minutes Intravenous Every 4 hours 04/29/21 2330 05/01/21 0958   04/29/21 2345  cefTRIAXone (ROCEPHIN) 2 g in sodium chloride 0.9 % 100 mL IVPB  Status:  Discontinued        2 g 200 mL/hr over 30 Minutes Intravenous Every 12 hours 04/29/21 2330 05/03/21 1123   04/29/21 2345  vancomycin (VANCOREADY) IVPB 2000 mg/400 mL        2,000 mg 200 mL/hr over 120 Minutes Intravenous  Once 04/29/21 2334 04/30/21 0240      Medications: Scheduled Meds: . amLODipine  5 mg Per Tube Daily  . chlorhexidine gluconate (MEDLINE KIT)  15 mL Mouth Rinse BID  . Chlorhexidine Gluconate Cloth  6 each Topical Daily  . feeding supplement (PROSource TF)  45 mL Per Tube TID  . fentaNYL (SUBLIMAZE) injection  50 mcg Intravenous Once  . furosemide  40 mg  Intravenous Q12H  . insulin aspart  0-15 Units Subcutaneous Q4H  . losartan  100 mg Per Tube Daily  . mouth rinse  15 mL Mouth Rinse 10 times per day  . metoprolol tartrate  50 mg Per Tube BID  . pantoprazole sodium  40 mg Per Tube Daily  . phosphorus  500 mg Per Tube BID  . QUEtiapine  50 mg Per Tube BID  . sodium chloride flush  10-40 mL Intracatheter Q12H   Continuous Infusions: . sodium chloride Stopped (05/05/21 0949)  . feeding supplement (VITAL AF 1.2 CAL) 1,000 mL (05/04/21 1830)  . fentaNYL infusion INTRAVENOUS 100 mcg/hr (05/05/21 1100)  . penicillin g continuous IV infusion 12 Million Units (05/05/21 1022)   PRN Meds:.sodium chloride, acetaminophen (TYLENOL) oral liquid 160 mg/5 mL, docusate, fentaNYL, LORazepam, polyethylene glycol, sodium chloride flush    Objective: Weight change: -0.1 kg  Intake/Output Summary (Last 24 hours) at 05/05/2021 1200 Last data filed at 05/05/2021 1100 Gross per 24 hour  Intake 3460.61 ml  Output 2675 ml  Net 785.61 ml   Blood pressure 118/84, pulse (!) 102, temperature 99.7 F (37.6 C), temperature  source Axillary, resp. rate 18, height 5' 10.98" (1.803 m), weight 114 kg, SpO2 93 %. Temp:  [97.7 F (36.5 C)-100 F (37.8 C)] 99.7 F (37.6 C) (05/23 1100) Pulse Rate:  [50-102] 102 (05/23 1000) Resp:  [14-20] 18 (05/23 0918) BP: (118-176)/(75-98) 118/84 (05/23 1000) SpO2:  [84 %-96 %] 93 % (05/23 1130) FiO2 (%):  [40 %] 40 % (05/23 1130) Weight:  [122 kg] 114 kg (05/23 0500)  Physical Exam: Physical Exam Constitutional:      Appearance: He is ill-appearing.     Interventions: He is intubated.  HENT:     Head: Normocephalic and atraumatic.  Eyes:     General:        Right eye: No discharge.        Left eye: No discharge.     Extraocular Movements: Extraocular movements intact.  Cardiovascular:     Rate and Rhythm: Tachycardia present.     Heart sounds: No murmur heard. No gallop.   Pulmonary:     Effort: He is intubated.   Abdominal:     General: There is no distension.     Palpations: There is no mass.  Musculoskeletal:        General: Normal range of motion.  Skin:    General: Skin is warm and dry.  Neurological:     General: No focal deficit present.      CBC:    BMET Recent Labs    05/04/21 0122 05/05/21 0215  NA 143 144  K 4.5 4.3  CL 109 109  CO2 28 28  GLUCOSE 206* 201*  BUN 26* 30*  CREATININE 0.78 0.79  CALCIUM 7.9* 7.5*     Liver Panel  Recent Labs    05/05/21 0215  PROT 5.4*  ALBUMIN 2.1*  AST 43*  ALT 133*  ALKPHOS 44  BILITOT 0.6       Sedimentation Rate No results for input(s): ESRSEDRATE in the last 72 hours. C-Reactive Protein No results for input(s): CRP in the last 72 hours.  Micro Results: Recent Results (from the past 720 hour(s))  SARS CORONAVIRUS 2 (TAT 6-24 HRS) Nasopharyngeal Nasopharyngeal Swab     Status: None   Collection Time: 04/29/21 10:57 PM   Specimen: Nasopharyngeal Swab  Result Value Ref Range Status   SARS Coronavirus 2 NEGATIVE NEGATIVE Final    Comment: (NOTE) SARS-CoV-2 target nucleic acids are NOT DETECTED.  The SARS-CoV-2 RNA is generally detectable in upper and lower respiratory specimens during the acute phase of infection. Negative results do not preclude SARS-CoV-2 infection, do not rule out co-infections with other pathogens, and should not be used as the sole basis for treatment or other patient management decisions. Negative results must be combined with clinical observations, patient history, and epidemiological information. The expected result is Negative.  Fact Sheet for Patients: SugarRoll.be  Fact Sheet for Healthcare Providers: https://www.woods-mathews.com/  This test is not yet approved or cleared by the [place] FDA and  has been authorized for detection and/or diagnosis of SARS-CoV-2 by FDA under an Emergency Use Authorization (EUA). This EUA will remain   in effect (meaning this test can be used) for the duration of the COVID-19 declaration under Se ction 564(b)(1) of the Act, 21 U.S.C. section 360bbb-3(b)(1), unless the authorization is terminated or revoked sooner.  Performed at [facility], [place] [address] [place], [place] [address]   CSF culture     Status: None   Collection Time: 04/30/21  2:57 AM  Specimen: CSF; Cerebrospinal Fluid  Result Value Ref Range Status   Specimen Description CSF  Final   Special Requests Normal  Final   Gram Stain   Final    GRAM POSITIVE COCCI WBC PRESENT, PREDOMINANTLY MONONUCLEAR CRITICAL RESULT CALLED TO, READ BACK BY AND VERIFIED WITH: RN MEGAN PLUMMER BY MESSAN H. AT 0345 ON 5 18 2022 CYTOSPIN SMEAR Performed at [facility], [place] [address], [place], [place] [address]    Culture FEW STREPTOCOCCUS PNEUMONIAE  Final   Report Status 05/02/2021 FINAL  Final   Organism ID, Bacteria STREPTOCOCCUS PNEUMONIAE  Final      Susceptibility   Streptococcus pneumoniae - MIC*    ERYTHROMYCIN <=0.12 SENSITIVE Sensitive     LEVOFLOXACIN 0.5 SENSITIVE Sensitive     VANCOMYCIN 0.5 SENSITIVE Sensitive     PENICILLIN (meningitis) <=0.06 SENSITIVE Sensitive     PENO - penicillin <=0.06      PENICILLIN (non-meningitis) <=0.06 SENSITIVE Sensitive     PENICILLIN (oral) <=0.06 SENSITIVE Sensitive     CEFTRIAXONE (non-meningitis) <=0.12 SENSITIVE Sensitive     CEFTRIAXONE (meningitis) <=0.12 SENSITIVE Sensitive     * FEW STREPTOCOCCUS PNEUMONIAE  Culture, fungus without smear     Status: None (Preliminary result)   Collection Time: 04/30/21  2:57 AM   Specimen: CSF; Cerebrospinal Fluid  Result Value Ref Range Status   Specimen Description CSF  Final   Special Requests NONE  Final   Culture   Final    NO FUNGUS ISOLATED AFTER 5 DAYS Performed at [facility], [address] [place], [place] [address]    Report Status PENDING  Incomplete  MRSA PCR Screening     Status: Abnormal    Collection Time: 04/30/21  5:17 AM   Specimen: Nasal Mucosa; Nasopharyngeal  Result Value Ref Range Status   MRSA by PCR POSITIVE (A) NEGATIVE Final    Comment:        The GeneXpert MRSA Assay (FDA approved for NASAL specimens only), is one component of a comprehensive MRSA colonization surveillance program. It is not intended to diagnose MRSA infection nor to guide or monitor treatment for MRSA infections. RESULT CALLED TO, READ BACK BY AND VERIFIED WITH: PLUMBER,M RN 04/30/2021 AT 1941 SKEEN,P Performed at [facility], [place] [address] [place], [place] [address]   HSV 1/2 Ab IgG/IgM CSF     Status: None   Collection Time: 04/30/21  5:27 AM   Specimen: Cerebrospinal Fluid  Result Value Ref Range Status   HSV 1/2 Ab, IgM, CSF 0.66 <=0.89 IV Final    Comment: (NOTE) INTERPRETIVE INFORMATION: Herpes Simplex Virus                          Type 1 and/or 2 Antibodies,                          IgM by ELISA, CSF  0.89 IV or Less .......... Negative: No significant                             level of detectable HSV IgM                             antibody.  0.90 - 1.09 IV ........... Equivocal: Questionable  presence of IgM antibodies.                             Repeat testing in 10-14 days                             may be helpful.  1.10 IV or Greater ....... Positive: IgM antibody to HSV                             detected, which may indicate a                             current or recent infection.                             However, low levels of IgM                             antibodies may occasionally                             persist for more than 12                             months post-infection. The detection of antibodies to herpes simplex virus in CSF may indicate central nervou s system infection. However, consideration must be given to possible contamination by blood or transfer of serum antibodies across the blood-brain  barrier. Fourfold or greater rise in CSF antibodies to herpes on specimens at least 4 weeks apart are found in 74-94 % of patients with herpes encephalitis. Specificity of the test based on a single CSF testing is not established. Presently PCR is the primary means of establishing a diagnosis of herpes encephalitis. This test was developed and its performance characteristics determined by [employer]. It has not been cleared or approved by the [place] Food and Drug Administration. This test was performed in a CLIA certified laboratory and is intended for clinical purposes.    HSV 1/2 Ab Screen IgG, CSF <0.34 <=0.89 IV Final    Comment: (NOTE) INTERPRETIVE INFORMATION: Herpes Simplex Virus Type 1 and/or 2                    Antibodies, IgG CSF  0.89 IV or Less .......... Negative: No significant                             level of detectable HSV IgG                             antibody.  0.90 - 1.09 IV ........... Equivocal: Questionable                             presence of IgG antibodies.                             Repeat testing in 10-14 days  may be helpful.  1.10 IV or Greater ....... Positive: IgG antibody to HSV                             detected, which may indicate                             a current or past HSV                             infection. The detection of antibodies to herpes simplex virus in CSF may indicate central nervous system infection. However, consideration must be given to possible contamination by blood or transfer of serum antibodies across the blood-brain barrier. Fourfold or greater rise in CSF antibodies to herpes on specimens at l east 4 weeks apart are found in 74-94 % of patients with herpes encephalitis. Specificity of the test based on a single CSF testing is not established. Presently PCR is the primary means of establishing a diagnosis of herpes encephalitis. This test was developed and its performance  characteristics determined by [employer]. It has not been cleared or approved by the [place] Food and Drug Administration. This test was performed in a CLIA certified laboratory and is intended for clinical purposes. Performed At: [facility] [address] [place], [place] [id number] Hilton Sinclair I MD [id number]   Culture, blood (Routine X 2) w Reflex to ID Panel     Status: None   Collection Time: 04/30/21 11:30 AM   Specimen: BLOOD  Result Value Ref Range Status   Specimen Description BLOOD LEFT ANTECUBITAL  Final   Special Requests   Final    BOTTLES DRAWN AEROBIC AND ANAEROBIC Blood Culture adequate volume   Culture   Final    NO GROWTH 5 DAYS Performed at [facility], [place] [address] [place], [place] [address]    Report Status 05/05/2021 FINAL  Final  Culture, blood (Routine X 2) w Reflex to ID Panel     Status: None   Collection Time: 04/30/21 11:47 AM   Specimen: BLOOD LEFT FOREARM  Result Value Ref Range Status   Specimen Description BLOOD LEFT FOREARM  Final   Special Requests   Final    BOTTLES DRAWN AEROBIC ONLY Blood Culture results may not be optimal due to an inadequate volume of blood received in culture bottles   Culture   Final    NO GROWTH 5 DAYS Performed at [facility], [place] [address], [place], [place] [address]    Report Status 05/05/2021 FINAL  Final    Studies/Results: DG Chest Port 1 View  Result Date: 05/04/2021 CLINICAL DATA:  Acute respiratory failure EXAM: PORTABLE CHEST 1 VIEW COMPARISON:  May 02, 2021 FINDINGS: An NG tube terminates in the region of the proximal duodenum. A right PICC line terminates near the caval atrial junction. An ETT is in good position. No pneumothorax. Stable cardiomegaly. The hila and mediastinum are unchanged. Mild atelectasis in the left base. No overt edema or focal infiltrate. No nodule or mass. IMPRESSION: 1. The NG tube terminates in the right side of the abdomen, probably in the  proximal right duodenum. Consider withdrawing 5 or 6 cm. 2. Other support apparatus as above. 3. No other acute abnormalities. Electronically Signed   By: Dorise Bullion III M.D   On:  05/04/2021 07:08      Assessment/Plan:  INTERVAL HISTORY: Patient has been narrowed to high-dose penicillin   Active Problems:   AMS (altered mental status)   Meningitis   Acute respiratory failure with hypoxemia (HCC)    Hayden Mcbride is a 60 y.o. male with severe pneumococcal meningitis  #`1 pneumococcal meningitis:  Continue high-dose penicillin While he was not bacteremic will check TTE   I spent greater than 35 minutes with the patient including greater than 50% of time in face to face counsel of the patient and spouse in review of his radiographs, lab and micrological data.    LOS: 5 days   Alcide Evener 05/05/2021, 12:00 PM

## 2021-05-05 NOTE — Plan of Care (Signed)
  Problem: Education: Goal: Knowledge of General Education information will improve Description: Including pain rating scale, medication(s)/side effects and non-pharmacologic comfort measures Outcome: Progressing   Problem: Health Behavior/Discharge Planning: Goal: Ability to manage health-related needs will improve Outcome: Progressing   Problem: Clinical Measurements: Goal: Ability to maintain clinical measurements within normal limits will improve Outcome: Progressing Goal: Will remain free from infection Outcome: Progressing Goal: Diagnostic test results will improve Outcome: Progressing Goal: Respiratory complications will improve Outcome: Progressing Goal: Cardiovascular complication will be avoided Outcome: Progressing   Problem: Activity: Goal: Risk for activity intolerance will decrease Outcome: Progressing   Problem: Nutrition: Goal: Adequate nutrition will be maintained Outcome: Progressing   Problem: Coping: Goal: Level of anxiety will decrease Outcome: Progressing   Problem: Elimination: Goal: Will not experience complications related to bowel motility Outcome: Progressing Goal: Will not experience complications related to urinary retention Outcome: Progressing   Problem: Pain Managment: Goal: General experience of comfort will improve Outcome: Progressing   Problem: Safety: Goal: Ability to remain free from injury will improve Outcome: Progressing   Problem: Skin Integrity: Goal: Risk for impaired skin integrity will decrease Outcome: Progressing   Problem: Safety: Goal: Non-violent Restraint(s) Outcome: Progressing   Problem: Activity: Goal: Ability to tolerate increased activity will improve Outcome: Progressing   Problem: Respiratory: Goal: Ability to maintain a clear airway and adequate ventilation will improve Outcome: Progressing   Problem: Role Relationship: Goal: Method of communication will improve Outcome: Progressing   

## 2021-05-06 ENCOUNTER — Inpatient Hospital Stay (HOSPITAL_COMMUNITY): Payer: Commercial Managed Care - PPO

## 2021-05-06 DIAGNOSIS — J9601 Acute respiratory failure with hypoxia: Secondary | ICD-10-CM | POA: Diagnosis not present

## 2021-05-06 DIAGNOSIS — R4182 Altered mental status, unspecified: Secondary | ICD-10-CM | POA: Diagnosis not present

## 2021-05-06 DIAGNOSIS — Z95828 Presence of other vascular implants and grafts: Secondary | ICD-10-CM | POA: Diagnosis not present

## 2021-05-06 DIAGNOSIS — G001 Pneumococcal meningitis: Secondary | ICD-10-CM | POA: Diagnosis not present

## 2021-05-06 DIAGNOSIS — R410 Disorientation, unspecified: Secondary | ICD-10-CM | POA: Diagnosis not present

## 2021-05-06 LAB — CBC
HCT: 54.9 % — ABNORMAL HIGH (ref 39.0–52.0)
Hemoglobin: 17.5 g/dL — ABNORMAL HIGH (ref 13.0–17.0)
MCH: 31.1 pg (ref 26.0–34.0)
MCHC: 31.9 g/dL (ref 30.0–36.0)
MCV: 97.7 fL (ref 80.0–100.0)
Platelets: 184 10*3/uL (ref 150–400)
RBC: 5.62 MIL/uL (ref 4.22–5.81)
RDW: 13.7 % (ref 11.5–15.5)
WBC: 18.2 10*3/uL — ABNORMAL HIGH (ref 4.0–10.5)
nRBC: 0.1 % (ref 0.0–0.2)

## 2021-05-06 LAB — GLUCOSE, CAPILLARY
Glucose-Capillary: 139 mg/dL — ABNORMAL HIGH (ref 70–99)
Glucose-Capillary: 158 mg/dL — ABNORMAL HIGH (ref 70–99)
Glucose-Capillary: 164 mg/dL — ABNORMAL HIGH (ref 70–99)
Glucose-Capillary: 176 mg/dL — ABNORMAL HIGH (ref 70–99)
Glucose-Capillary: 177 mg/dL — ABNORMAL HIGH (ref 70–99)
Glucose-Capillary: 180 mg/dL — ABNORMAL HIGH (ref 70–99)

## 2021-05-06 LAB — BASIC METABOLIC PANEL
Anion gap: 5 (ref 5–15)
BUN: 36 mg/dL — ABNORMAL HIGH (ref 8–23)
CO2: 30 mmol/L (ref 22–32)
Calcium: 7.3 mg/dL — ABNORMAL LOW (ref 8.9–10.3)
Chloride: 110 mmol/L (ref 98–111)
Creatinine, Ser: 0.81 mg/dL (ref 0.61–1.24)
GFR, Estimated: >60 mL/min (ref >60)
Glucose, Bld: 178 mg/dL — ABNORMAL HIGH (ref 70–99)
Potassium: 4.2 mmol/L (ref 3.5–5.1)
Sodium: 145 mmol/L (ref 135–145)

## 2021-05-06 LAB — MAGNESIUM: Magnesium: 2.4 mg/dL (ref 1.7–2.4)

## 2021-05-06 IMAGING — DX DG CHEST 1V PORT
1 series · 1 of 1 positions shown · non-contrast
Comparison: [DATE].

CLINICAL DATA: Respiratory failure.  Hypoxia.

EXAM:
PORTABLE CHEST 1 VIEW

[chest]
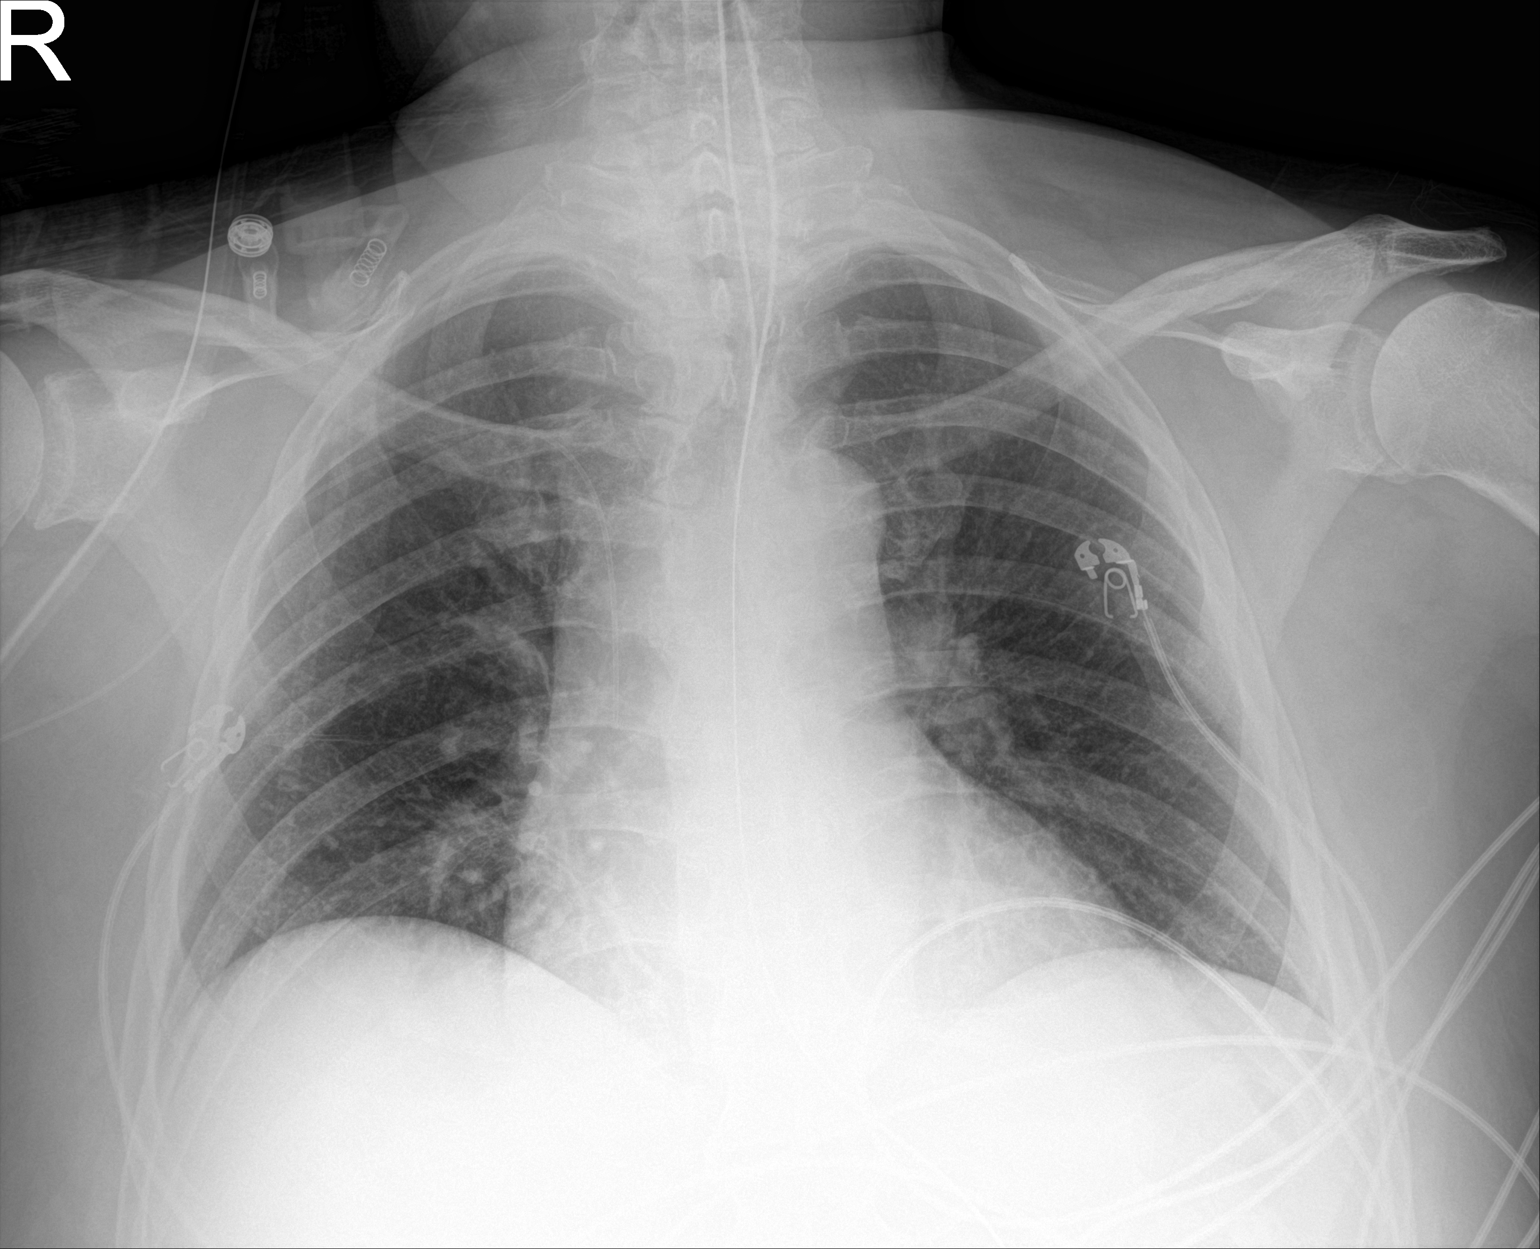

[1 of 1 positions shown; findings below may reference images not displayed]

FINDINGS: Endotracheal tube, right PICC line in stable position. NG tube tip
below left hemidiaphragm. NG tube tip not imaged. Heart size normal.
Low lung volumes with mild right base subsegmental atelectasis. No
pleural effusion or pneumothorax.
IMPRESSION: 1. Endotracheal tube right PICC line stable position. NG tube tip
below left hemidiaphragm. NG tube tip not imaged.

2.  Low lung volumes with mild right base subsegmental atelectasis.

## 2021-05-06 IMAGING — MR MR HEAD WO/W CM
13 of 15 series · 40 of 48 positions shown · IV contrast (Contrast agent)
Comparison: Head CT yesterday.  MRI [DATE]

CLINICAL DATA: Follow-up dural venous thrombosis. Bacterial
meningitis.

EXAM:
MRI HEAD WITHOUT AND WITH CONTRAST
TECHNIQUE: Multiplanar, multiecho pulse sequences of the brain and surrounding
structures were obtained without and with intravenous contrast.
CONTRAST:  10mL GADAVIST GADOBUTROL 1 MMOL/ML IV SOLN

[Series 5: DWI · axial · 3.0mm · 0.88mm/px · z∈[-40,+113]mm · 6 of 108 slices shown (1 of 4)]
[im 1/108]
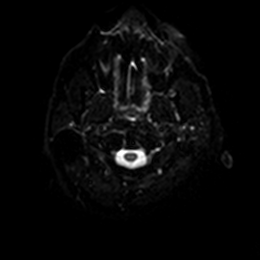
[im 22/108]
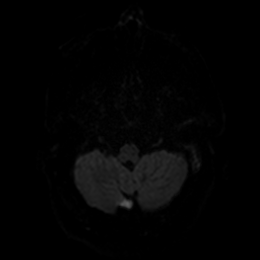
[im 43/108]
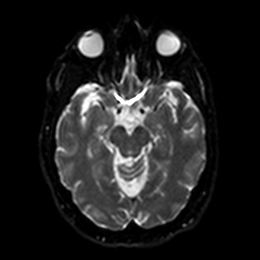
[im 65/108]
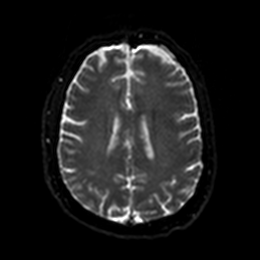
[im 86/108]
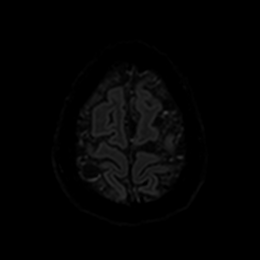
[im 108/108]
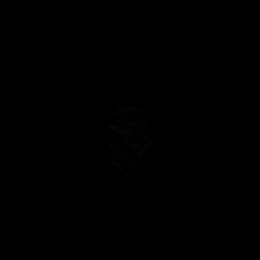

[Series 6: DWI · axial · 3.0mm · 0.88mm/px · z∈[-40,+113]mm · 2 of 53 slices shown (2 of 4)]
[im 1/53]
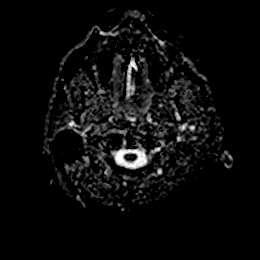
[im 53/53]
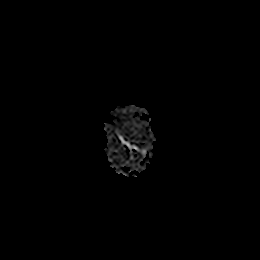

[Series 7: DWI · coronal · 4.0mm · 0.88mm/px · 5 of 76 slices shown (3 of 4)]
[im 1/76]
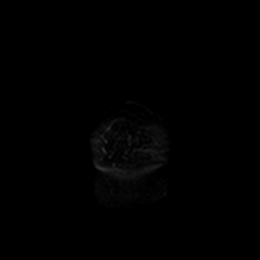
[im 19/76]
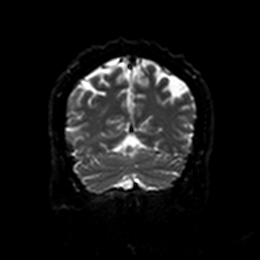
[im 38/76]
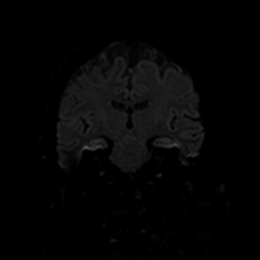
[im 57/76]
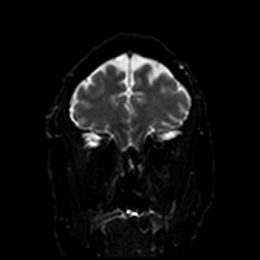
[im 76/76]
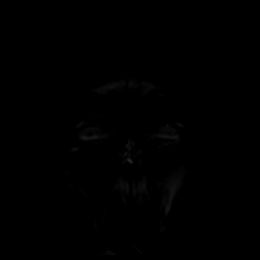

[Series 8: DWI · coronal · 4.0mm · 0.88mm/px · 2 of 38 slices shown (4 of 4)]
[im 1/38]
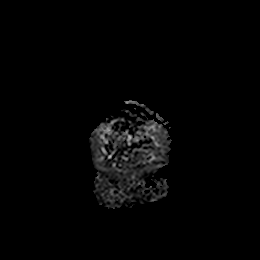
[im 38/38]
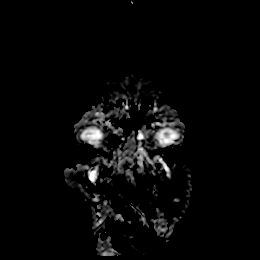

[Series 13: T1 · sagittal · 5.0mm · 0.78mm/px · 2 of 24 slices shown]
[im 1/24]
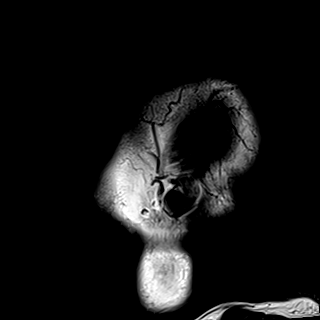
[im 24/24]
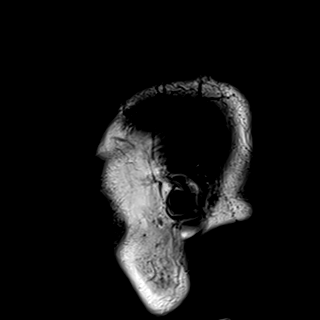

[Series 14: T2 · axial · 5.0mm · 0.72mm/px · z∈[-38,+100]mm · 2 of 25 slices shown]
[im 1/25]
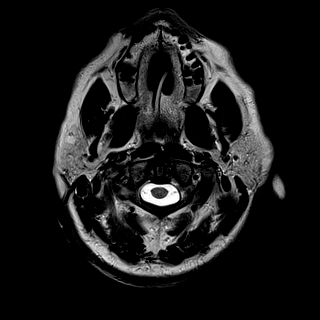
[im 25/25]
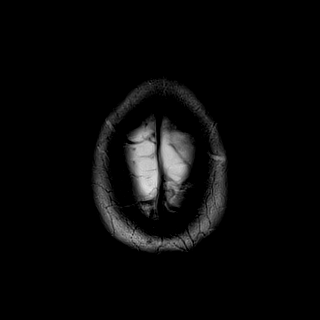

[Series 15: FLAIR · axial · 5.0mm · 0.45mm/px · z∈[-40,+99]mm · 2 of 25 slices shown]
[im 1/25]
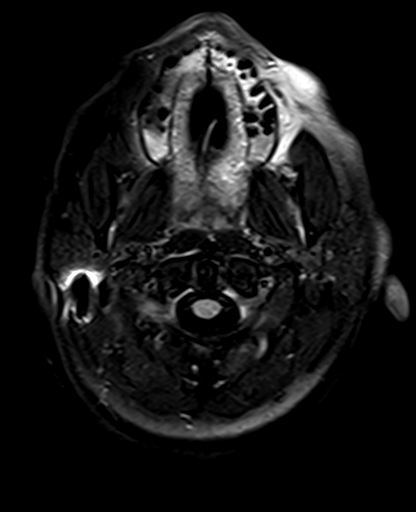
[im 25/25]
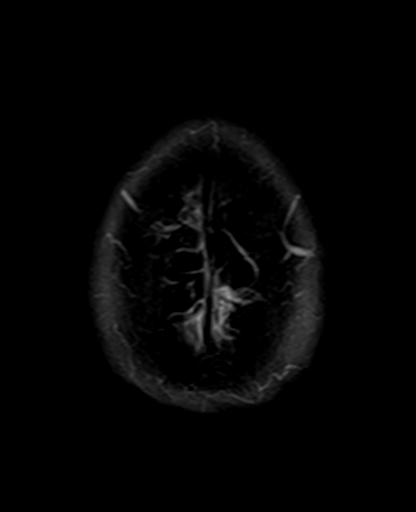

[Series 16: mag_images · axial · 3.0mm · 0.90mm/px · z∈[-47,+123]mm · 4 of 60 slices shown]
[im 1/60]
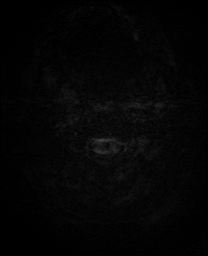
[im 20/60]
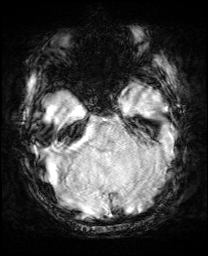
[im 40/60]
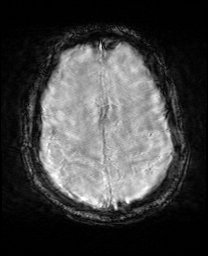
[im 60/60]
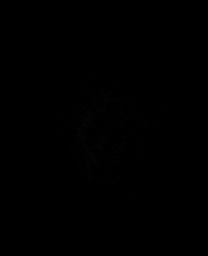

[Series 17: pha_images · axial · 3.0mm · 0.90mm/px · z∈[-47,+118]mm · 4 of 58 slices shown]
[im 1/58]
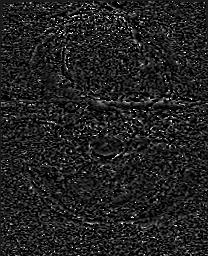
[im 20/58]
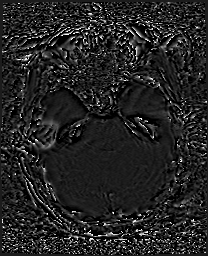
[im 39/58]
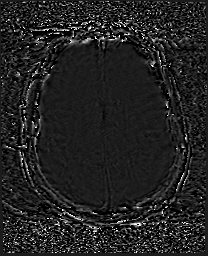
[im 58/58]
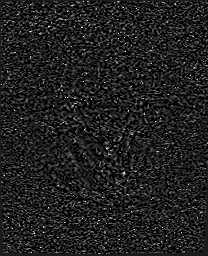

[Series 18: swi_images · axial · 3.0mm · 0.90mm/px · z∈[-47,+123]mm · 4 of 60 slices shown]
[im 1/60]
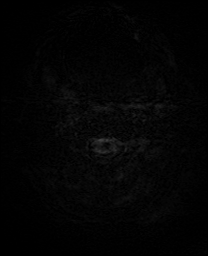
[im 20/60]
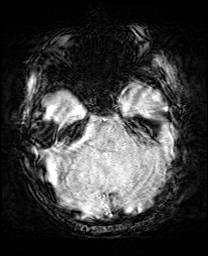
[im 40/60]
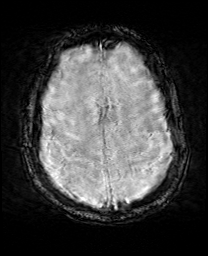
[im 60/60]
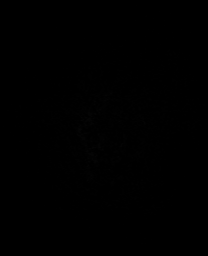

[Series 19: mip_images(sw) · axial · 24.0mm · 0.90mm/px · z∈[-37,+113]mm · 3 of 53 slices shown]
[im 1/53]
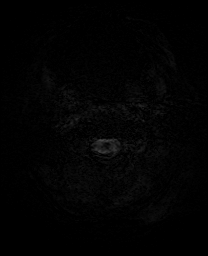
[im 27/53]
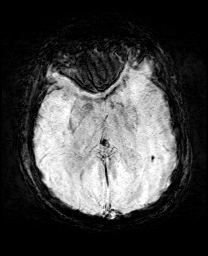
[im 53/53]
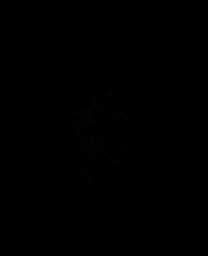

[Series 21: T2 post-contrast · coronal · 5.0mm · 0.72mm/px · 2 of 28 slices shown]
[im 1/28]
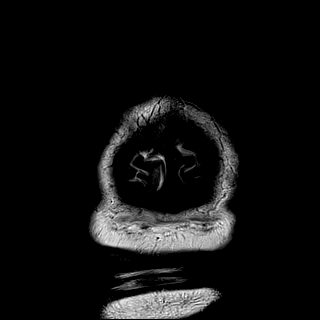
[im 28/28]
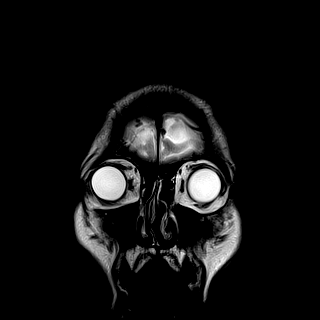

[Series 23: T1 post-contrast · coronal · 5.0mm · 0.34mm/px · 2 of 28 slices shown]
[im 1/28]
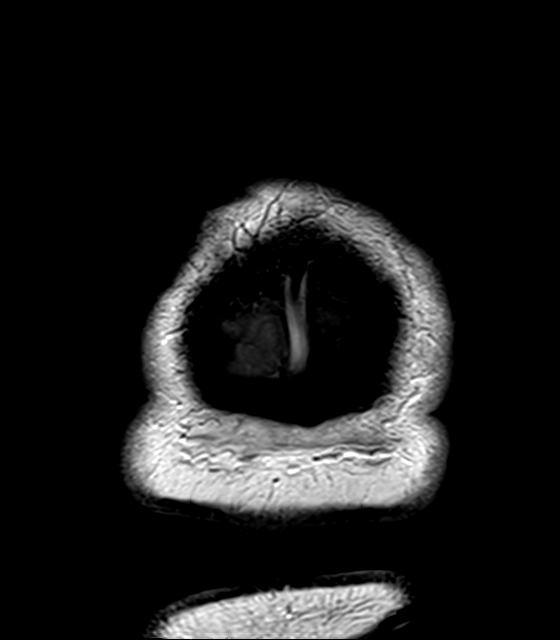
[im 28/28]
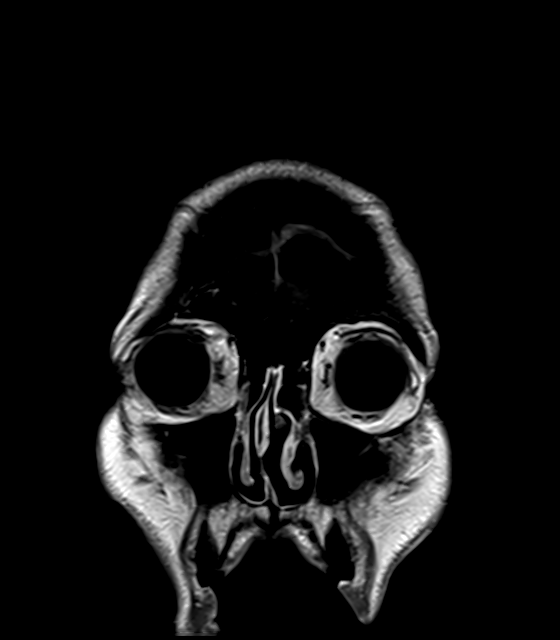

[40 of 48 positions shown; findings below may reference images not displayed]

FINDINGS: Brain: There is a constellation of findings consistent with
bacterial meningitis. Diffusion imaging does not show any restricted
diffusion affecting the brain parenchyma to indicate infarction.
Numerous septations/synechiae are noted within the subarachnoid
spaces, and there scattered discrete foci of restricted diffusion
within the subarachnoid spaces consistent with bacterial meningitis.
These appear progressive compared to the study of 6 days ago. No
evidence of ventriculitis of the lateral or third ventricles. Some
restricted diffusion material present in the fourth ventricle. No
evidence change in ventricular size. Small focus of hemosiderin
deposition in the left temporoparietal junction region is unchanged,
possibly pre-existing the current infection. After contrast
administration, there is a remarkable absence of abnormal contrast
enhancement of the leptomeninges.

Vascular: Major vessels at the base of the brain show flow.
Postcontrast imaging does not show evidence of venous thrombosis.

Skull and upper cervical spine: No primary bone pathology.

Sinuses/Orbits: Paranasal sinuses are clear. Fluid in left mastoid
region and middle ear, similar to the previous exam.

Other: None
IMPRESSION: Findings consistent with the clinical diagnosis of bacterial
meningitis. Since the previous study, the patient has developed more
numerous foci of restricted diffusion scattered about the
subarachnoid spaces, with areas of septation and synechiae formation
consistent with ongoing bacterial meningitis. Small amount of
restricted diffusion material present in the fourth ventricle, but
no evidence of widespread ventriculitis. No sign of venous
thrombosis. No sign of brain parenchymal infarction. No change in
ventricular size. Remarkable absence of abnormal leptomeningeal
contrast enhancement.

## 2021-05-06 IMAGING — MR MR [PERSON_NAME] HEAD
5 series · 38 of 48 positions shown · IV contrast (agent unspecified)
Comparison: MRI brain same day.  Head CT yesterday.

CLINICAL DATA: Bacterial meningitis.  Assess for venous thrombosis.

EXAM:
MR VENOGRAM HEAD WITHOUT AND WITH CONTRAST
TECHNIQUE: Angiographic images of the intracranial venous structures were
acquired using MRV technique without intravenous contrast.

[Series 6: venous inhance coronal_msum · coronal · portal-venous · 0.9mm · 0.57mm/px · 9 of 224 slices shown]
[im 1/224]
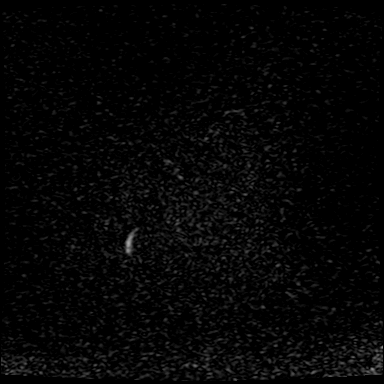
[im 32/224]
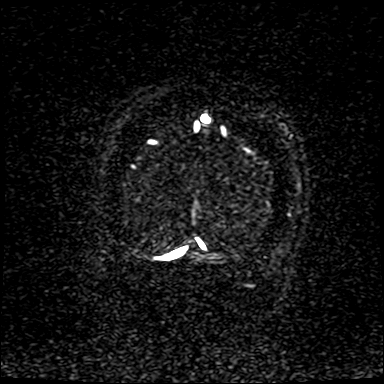
[im 64/224]
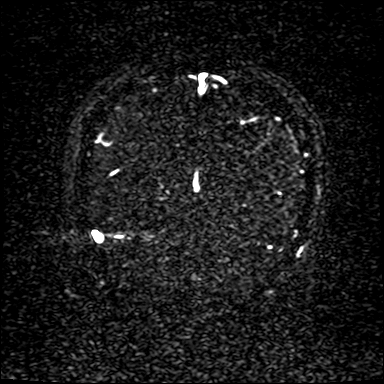
[im 96/224]
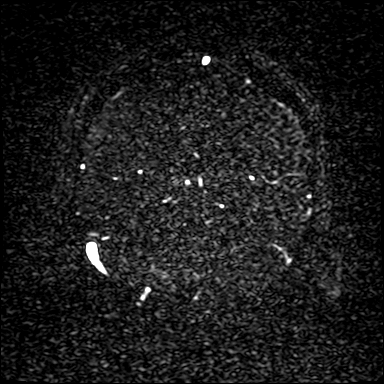
[im 112/224]
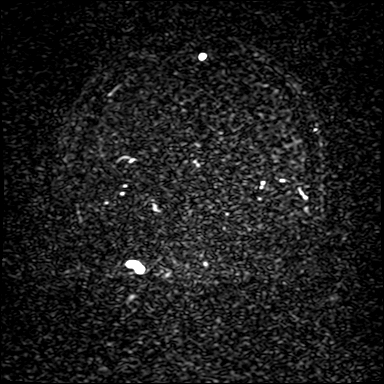
[im 128/224]
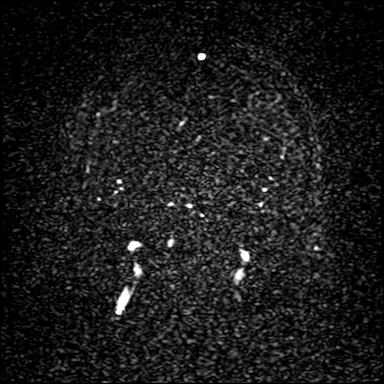
[im 160/224]
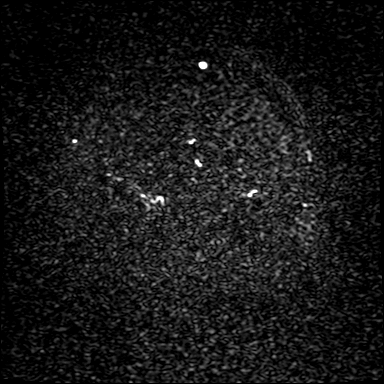
[im 192/224]
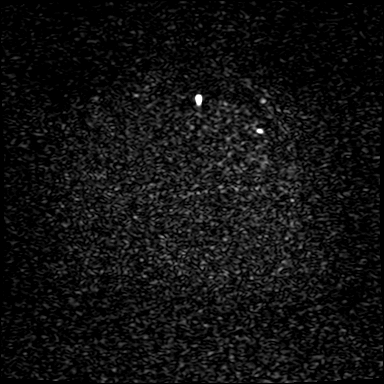
[im 224/224]
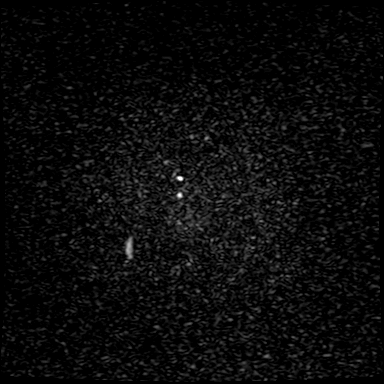

[Series 9: tof_fl2d_paracor · coronal · 2.0mm · 0.98mm/px · 9 of 145 slices shown]
[im 1/145]
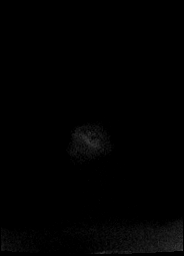
[im 19/145]
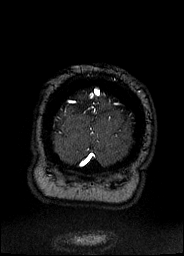
[im 37/145]
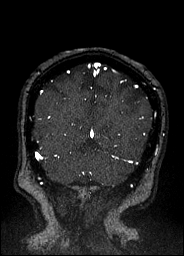
[im 55/145]
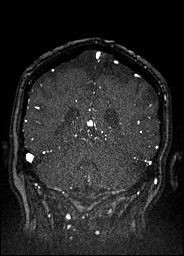
[im 73/145]
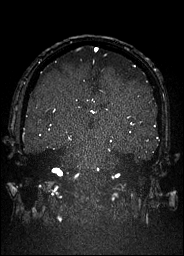
[im 91/145]
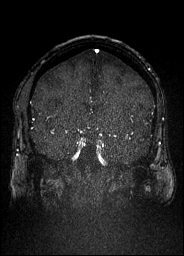
[im 109/145]
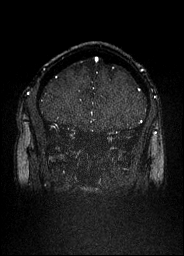
[im 127/145]
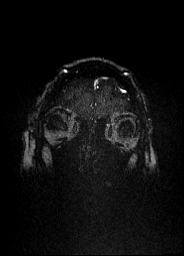
[im 145/145]
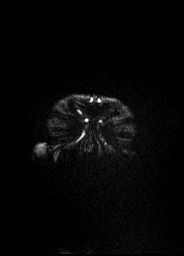

[Series 9: t1_fl3d_sag_p2_iso · sagittal · 1.0mm · 1.02mm/px · 11 of 176 slices shown]
[im 1/176]
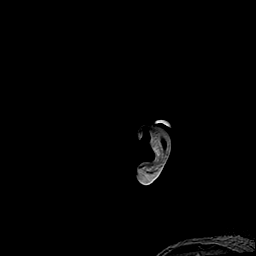
[im 18/176]
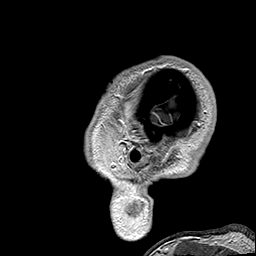
[im 36/176]
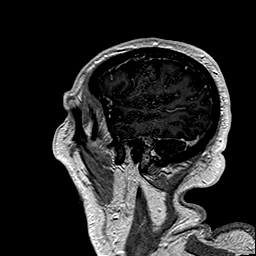
[im 53/176]
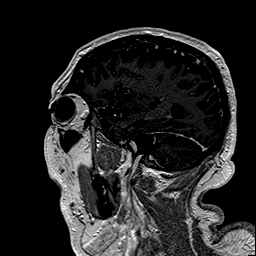
[im 71/176]
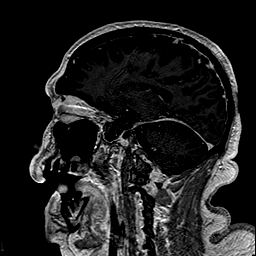
[im 88/176]
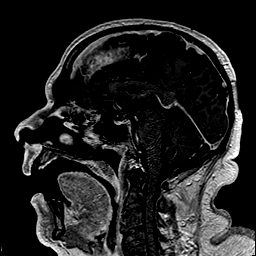
[im 106/176]
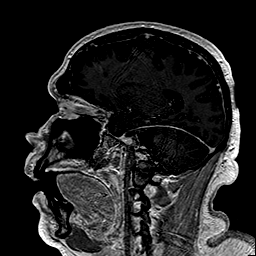
[im 123/176]
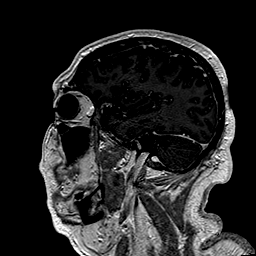
[im 141/176]
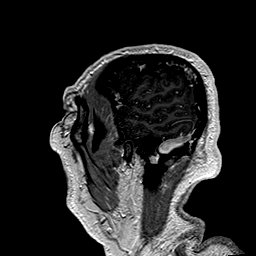
[im 158/176]
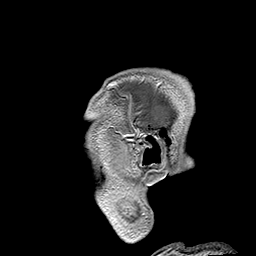
[im 176/176]
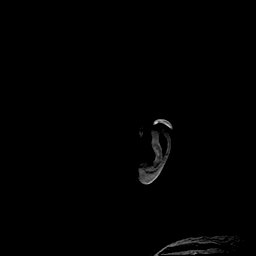

[Series 10: t1_fl3d_sag_p2_iso_mpr_ axial · axial · 2.0mm · 0.49mm/px · z∈[-59,+121]mm · 6 of 93 slices shown]
[im 1/93]
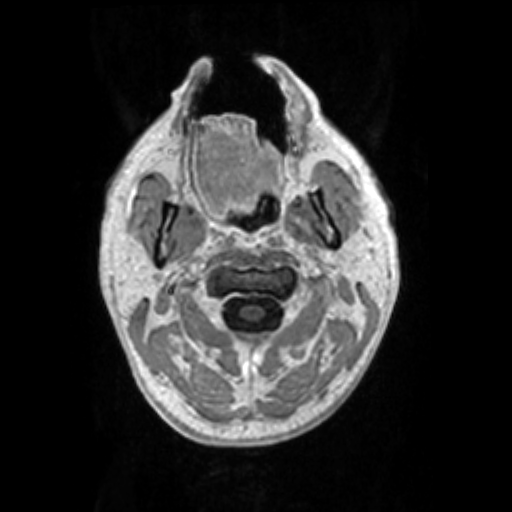
[im 19/93]
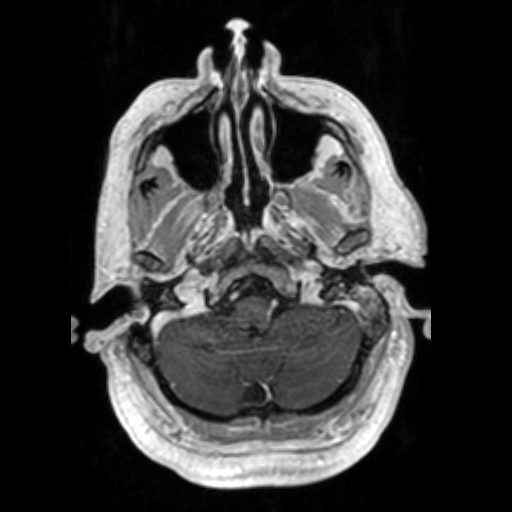
[im 37/93]
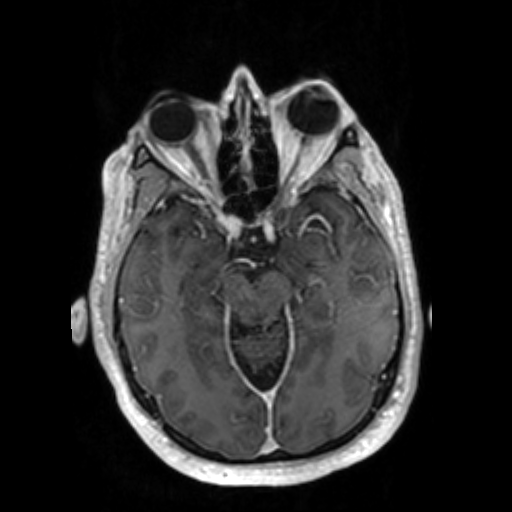
[im 56/93]
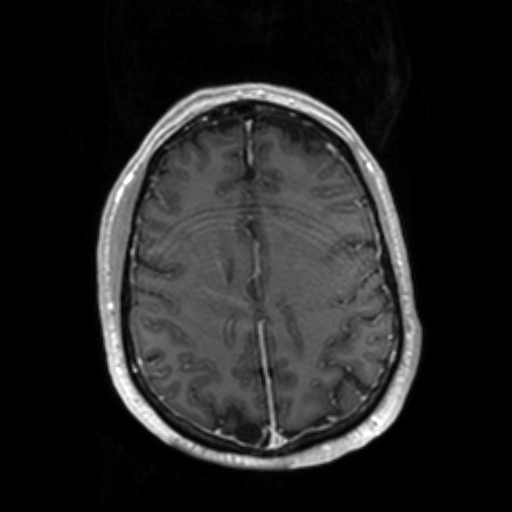
[im 74/93]
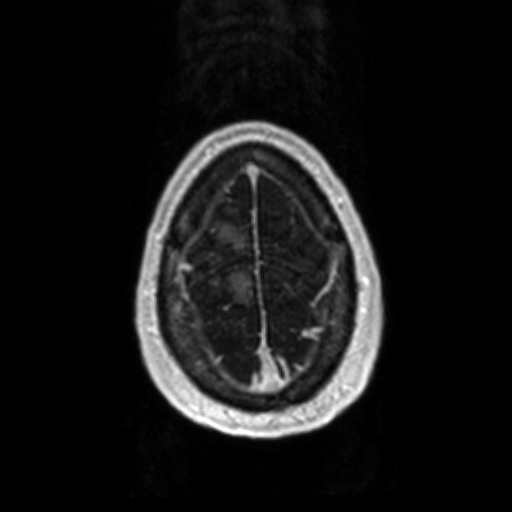
[im 93/93]
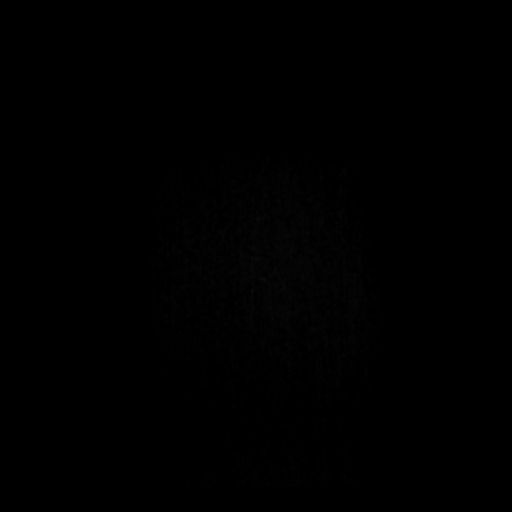

[Series 11: t1_fl3d_sag_p2_iso_mpr_coronal · coronal · 2.0mm · 0.45mm/px · 3 of 112 slices shown]
[im 1/112]
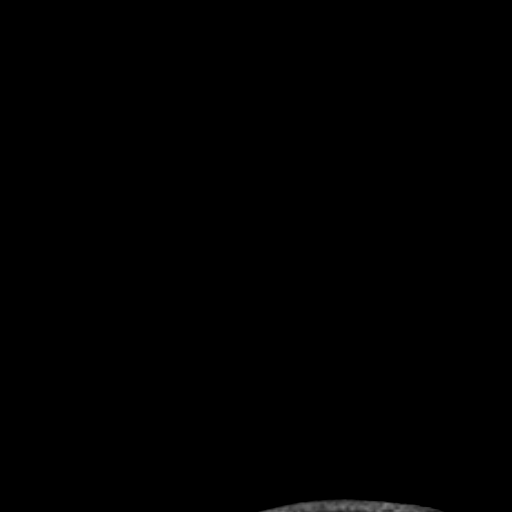
[im 19/112]
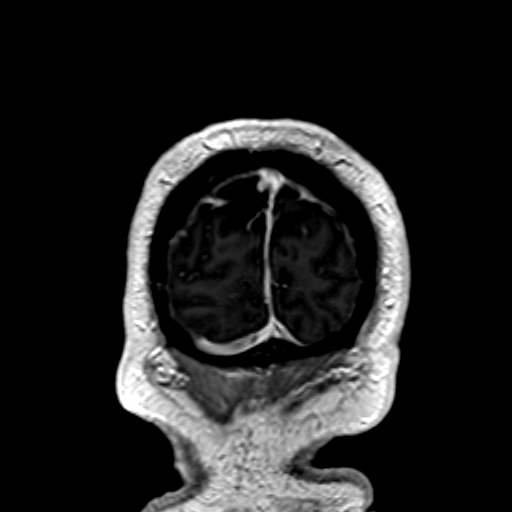
[im 38/112]
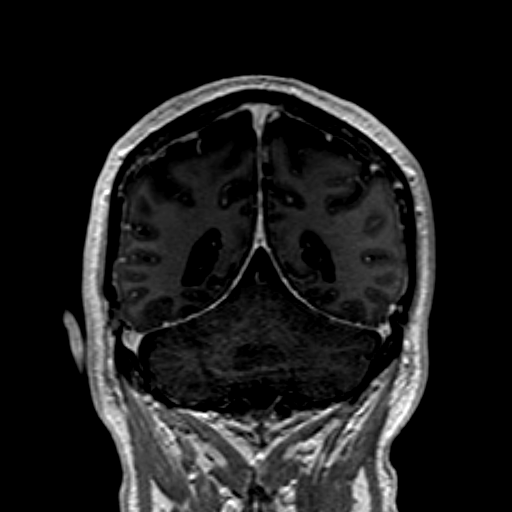

[38 of 48 positions shown; findings below may reference images not displayed]

FINDINGS: No evidence intracranial venous thrombosis. Dominant right
transverse sinus and jugular system, with a diminutive left
transverse sinus, sigmoid sinus and jugular vein, but without
evidence of thrombosis, particularly when correlated with the
postcontrast images from the brain MRI same day.
IMPRESSION: No intracranial venous thrombosis. The patient shows dominance of
the right transverse sinus and jugular system. The left transverse
sinus is diminutive, but does not show evidence of thrombosis,
particularly given the postcontrast imaging.

## 2021-05-06 MED ORDER — GADOBUTROL 1 MMOL/ML IV SOLN
10.0000 mL | Freq: Once | INTRAVENOUS | Status: AC | PRN
  Administered 2021-05-06: 10 mL via INTRAVENOUS

## 2021-05-06 MED ORDER — METOPROLOL TARTRATE 5 MG/5ML IV SOLN
5.0000 mg | Freq: Four times a day (QID) | INTRAVENOUS | Status: DC | PRN
  Administered 2021-05-06 – 2021-05-07 (×3): 5 mg via INTRAVENOUS
  Administered 2021-05-09: 10 mg via INTRAVENOUS
  Administered 2021-05-09: 5 mg via INTRAVENOUS
  Filled 2021-05-06: qty 10
  Filled 2021-05-06 (×2): qty 5
  Filled 2021-05-06: qty 10
  Filled 2021-05-06: qty 5

## 2021-05-06 MED ORDER — DOCUSATE SODIUM 50 MG/5ML PO LIQD
100.0000 mg | Freq: Two times a day (BID) | ORAL | Status: DC
  Administered 2021-05-06 – 2021-05-08 (×5): 100 mg
  Filled 2021-05-06 (×5): qty 10

## 2021-05-06 MED ORDER — BISACODYL 10 MG RE SUPP
10.0000 mg | Freq: Every day | RECTAL | Status: DC | PRN
  Administered 2021-05-06: 10 mg via RECTAL
  Filled 2021-05-06: qty 1

## 2021-05-06 MED ORDER — POLYETHYLENE GLYCOL 3350 17 G PO PACK
17.0000 g | PACK | Freq: Every day | ORAL | Status: DC
Start: 2021-05-07 — End: 2021-05-10
  Administered 2021-05-07: 17 g
  Filled 2021-05-06: qty 1

## 2021-05-06 MED ORDER — FUROSEMIDE 10 MG/ML IJ SOLN
40.0000 mg | Freq: Two times a day (BID) | INTRAMUSCULAR | Status: AC
  Administered 2021-05-06 (×2): 40 mg via INTRAVENOUS
  Filled 2021-05-06 (×2): qty 4

## 2021-05-06 NOTE — Progress Notes (Signed)
Nutrition Follow-up  DOCUMENTATION CODES:   Not applicable  INTERVENTION:   - Recommend increasing bowel regimen as pt has not had a BM x 6 days  Continue tube feeding via OG tube: - Vital AF 1.2 @ 65 ml/hr (1560 ml/day) - ProSource TF 45 ml TID  Tube feeding regimen provides 1992 kcal, 150 grams of protein, and 1265 ml of H2O.  NUTRITION DIAGNOSIS:   Increased nutrient needs related to acute illness as evidenced by estimated needs.  Ongoing, being addressed via TF  GOAL:   Patient will meet greater than or equal to 90% of their needs  Met via TF  MONITOR:   Vent status,Skin,TF tolerance,Weight trends,Labs,I & O's  REASON FOR ASSESSMENT:   Ventilator    ASSESSMENT:   Patient with PMH significant for HTN, GERD, colonic polyps, thyroid disease, and OSA. Presents this admission with bacterial meningitis.  Per notes, pt with some slow neurological improvement. Per CCM, pt's mental status precludes extubation.   Discussed pt with RN and during ICU rounds. Pt tolerating TF without issue but has not yet had a BM. RN notes that colace and miralax were ordered PRN, now changed to scheduled. RN to administer suppository.  Weight stable over last 4 days between 250-251 lbs. Will continue to monitor trends.  Admit weight: 116.8 kg Current weight: 113.5 kg  Pt with moderate pitting edema to BUE and mild pitting edema to BLE.  Patient remains intubated on ventilator support MV: 12 L/min Temp (24hrs), Avg:100.2 F (37.9 C), Min:99.5 F (37.5 C), Max:101.6 F (38.7 C)  Drips: Fentanyl  Medications reviewed and include: colace, miralax, IV lasix, SSI q 4 hours, protonix, IV penicillin  Labs reviewed: BUN 36, phosphorus 2.4 (on 5/23), elevated LFTs CBG's: 139-196 x 24 hours  UOP: 2375 ml x 24 hours I/O's: +9.0 L since admit  Diet Order:   Diet Order            Diet NPO time specified  Diet effective now                 EDUCATION NEEDS:   Not appropriate  for education at this time  Skin:  Skin Assessment: Reviewed RN Assessment  Last BM:  no documented BM  Height:   Ht Readings from Last 1 Encounters:  05/01/21 5' 10.98" (1.803 m)    Weight:   Wt Readings from Last 1 Encounters:  05/06/21 113.5 kg    BMI:  Body mass index is 34.91 kg/m.  Estimated Nutritional Needs:   Kcal:  1900-2100 kcal  Protein:  150-185 grams  Fluid:  >/= 1.8 L/day    Gustavus Bryant, MS, RD, LDN Inpatient Clinical Dietitian Please see AMiON for contact information.

## 2021-05-06 NOTE — Plan of Care (Signed)

## 2021-05-06 NOTE — Progress Notes (Signed)
Notified provider patient in Afib, HR up to 180, PO metoprolol given early. No new orders at this time. Will continue to monitor.

## 2021-05-06 NOTE — Progress Notes (Signed)
Subjective: On ventilator   Antibiotics:  Anti-infectives (From admission, onward)   Start     Dose/Rate Route Frequency Ordered Stop   05/03/21 1215  penicillin G potassium 12 Million Units in dextrose 5 % 500 mL continuous infusion        12 Million Units 41.7 mL/hr over 12 Hours Intravenous Every 12 hours 05/03/21 1123     05/01/21 2200  vancomycin (VANCOREADY) IVPB 1000 mg/200 mL  Status:  Discontinued        1,000 mg 200 mL/hr over 60 Minutes Intravenous Every 8 hours 05/01/21 1312 05/02/21 0902   05/01/21 1400  vancomycin (VANCOREADY) IVPB 1000 mg/200 mL        1,000 mg 200 mL/hr over 60 Minutes Intravenous NOW 05/01/21 1312 05/01/21 1523   04/30/21 1200  vancomycin (VANCOREADY) IVPB 1000 mg/200 mL  Status:  Discontinued        1,000 mg 200 mL/hr over 60 Minutes Intravenous Every 12 hours 04/29/21 2334 05/01/21 1312   04/30/21 0600  acyclovir (ZOVIRAX) 1,000 mg in dextrose 5 % 150 mL IVPB  Status:  Discontinued        1,000 mg 170 mL/hr over 60 Minutes Intravenous Every 8 hours 04/30/21 0243 04/30/21 1119   04/30/21 0000  ampicillin (OMNIPEN) 2 g in sodium chloride 0.9 % 100 mL IVPB  Status:  Discontinued        2 g 300 mL/hr over 20 Minutes Intravenous Every 4 hours 04/29/21 2330 05/01/21 0958   04/29/21 2345  cefTRIAXone (ROCEPHIN) 2 g in sodium chloride 0.9 % 100 mL IVPB  Status:  Discontinued        2 g 200 mL/hr over 30 Minutes Intravenous Every 12 hours 04/29/21 2330 05/03/21 1123   04/29/21 2345  vancomycin (VANCOREADY) IVPB 2000 mg/400 mL        2,000 mg 200 mL/hr over 120 Minutes Intravenous  Once 04/29/21 2334 04/30/21 0240      Medications: Scheduled Meds: . amLODipine  5 mg Per Tube Daily  . chlorhexidine gluconate (MEDLINE KIT)  15 mL Mouth Rinse BID  . Chlorhexidine Gluconate Cloth  6 each Topical Daily  . docusate  100 mg Per Tube BID  . feeding supplement (PROSource TF)  45 mL Per Tube TID  . fentaNYL (SUBLIMAZE) injection  50 mcg  Intravenous Once  . furosemide  40 mg Intravenous Q12H  . heparin injection (subcutaneous)  5,000 Units Subcutaneous Q8H  . insulin aspart  0-15 Units Subcutaneous Q4H  . losartan  100 mg Per Tube Daily  . mouth rinse  15 mL Mouth Rinse 10 times per day  . metoprolol tartrate  50 mg Per Tube BID  . pantoprazole sodium  40 mg Per Tube Daily  . [START ON 05/07/2021] polyethylene glycol  17 g Per Tube Daily  . QUEtiapine  50 mg Per Tube BID  . sodium chloride flush  10-40 mL Intracatheter Q12H   Continuous Infusions: . sodium chloride Stopped (05/05/21 0949)  . feeding supplement (VITAL AF 1.2 CAL) 1,000 mL (05/06/21 0216)  . fentaNYL infusion INTRAVENOUS 100 mcg/hr (05/06/21 1300)  . penicillin g continuous IV infusion 12 Million Units (05/06/21 1006)   PRN Meds:.sodium chloride, acetaminophen (TYLENOL) oral liquid 160 mg/5 mL, bisacodyl, fentaNYL, LORazepam, metoprolol tartrate, sodium chloride flush    Objective: Weight change: -0.5 kg  Intake/Output Summary (Last 24 hours) at 05/06/2021 1516 Last data filed at 05/06/2021 1400 Gross per 24 hour  Intake 3052.96  ml  Output 2400 ml  Net 652.96 ml   Blood pressure (!) 111/51, pulse 85, temperature 100.2 F (37.9 C), temperature source Axillary, resp. rate 16, height 5' 10.98" (1.803 m), weight 113.5 kg, SpO2 95 %. Temp:  [99.5 F (37.5 C)-101.6 F (38.7 C)] 100.2 F (37.9 C) (05/24 1511) Pulse Rate:  [64-160] 85 (05/24 1500) Resp:  [8-25] 16 (05/24 1500) BP: (106-143)/(51-96) 111/51 (05/24 1500) SpO2:  [92 %-98 %] 95 % (05/24 1500) FiO2 (%):  [40 %] 40 % (05/24 1200) Weight:  [113.5 kg] 113.5 kg (05/24 0500)  Physical Exam: Physical Exam Constitutional:      Appearance: He is well-developed. He is ill-appearing.     Interventions: He is intubated.  HENT:     Head: Normocephalic and atraumatic.  Eyes:     General:        Right eye: No discharge.        Left eye: No discharge.     Extraocular Movements: Extraocular  movements intact.     Conjunctiva/sclera: Conjunctivae normal.  Cardiovascular:     Rate and Rhythm: Regular rhythm. Tachycardia present.     Heart sounds: No murmur heard. No gallop.   Pulmonary:     Effort: Pulmonary effort is normal. No respiratory distress. He is intubated.     Breath sounds: No wheezing.  Abdominal:     General: There is no distension.     Palpations: Abdomen is soft. There is no mass.  Musculoskeletal:        General: Normal range of motion.     Cervical back: Normal range of motion and neck supple.  Skin:    General: Skin is warm and dry.     Findings: No erythema or rash.  Neurological:     Mental Status: He is alert and oriented to person, place, and time.  Psychiatric:        Behavior: Behavior normal.        Thought Content: Thought content normal.        Judgment: Judgment normal.      CBC:    BMET Recent Labs    05/05/21 0215 05/06/21 0540  NA 144 145  K 4.3 4.2  CL 109 110  CO2 28 30  GLUCOSE 201* 178*  BUN 30* 36*  CREATININE 0.79 0.81  CALCIUM 7.5* 7.3*     Liver Panel  Recent Labs    05/05/21 0215  PROT 5.4*  ALBUMIN 2.1*  AST 43*  ALT 133*  ALKPHOS 44  BILITOT 0.6       Sedimentation Rate No results for input(s): ESRSEDRATE in the last 72 hours. C-Reactive Protein No results for input(s): CRP in the last 72 hours.  Micro Results: Recent Results (from the past 720 hour(s))  SARS CORONAVIRUS 2 (TAT 6-24 HRS) Nasopharyngeal Nasopharyngeal Swab     Status: None   Collection Time: 04/29/21 10:57 PM   Specimen: Nasopharyngeal Swab  Result Value Ref Range Status   SARS Coronavirus 2 NEGATIVE NEGATIVE Final    Comment: (NOTE) SARS-CoV-2 target nucleic acids are NOT DETECTED.  The SARS-CoV-2 RNA is generally detectable in upper and lower respiratory specimens during the acute phase of infection. Negative results do not preclude SARS-CoV-2 infection, do not rule out co-infections with other pathogens, and should  not be used as the sole basis for treatment or other patient management decisions. Negative results must be combined with clinical observations, patient history, and epidemiological information. The expected result is Negative.  Fact  Sheet for Patients: SugarRoll.be  Fact Sheet for Healthcare Providers: https://www.woods-mathews.com/  This test is not yet approved or cleared by the [place] FDA and  has been authorized for detection and/or diagnosis of SARS-CoV-2 by FDA under an Emergency Use Authorization (EUA). This EUA will remain  in effect (meaning this test can be used) for the duration of the COVID-19 declaration under Se ction 564(b)(1) of the Act, 21 U.S.C. section 360bbb-3(b)(1), unless the authorization is terminated or revoked sooner.  Performed at [facility], [place] [address] [place], [place] [address]   CSF culture     Status: None   Collection Time: 04/30/21  2:57 AM   Specimen: CSF; Cerebrospinal Fluid  Result Value Ref Range Status   Specimen Description CSF  Final   Special Requests Normal  Final   Gram Stain   Final    GRAM POSITIVE COCCI WBC PRESENT, PREDOMINANTLY MONONUCLEAR CRITICAL RESULT CALLED TO, READ BACK BY AND VERIFIED WITH: RN MEGAN PLUMMER BY MESSAN H. AT 0345 ON 5 18 2022 CYTOSPIN SMEAR Performed at [facility], [place] [address], [place], [place] [address]    Culture FEW STREPTOCOCCUS PNEUMONIAE  Final   Report Status 05/02/2021 FINAL  Final   Organism ID, Bacteria STREPTOCOCCUS PNEUMONIAE  Final      Susceptibility   Streptococcus pneumoniae - MIC*    ERYTHROMYCIN <=0.12 SENSITIVE Sensitive     LEVOFLOXACIN 0.5 SENSITIVE Sensitive     VANCOMYCIN 0.5 SENSITIVE Sensitive     PENICILLIN (meningitis) <=0.06 SENSITIVE Sensitive     PENO - penicillin <=0.06      PENICILLIN (non-meningitis) <=0.06 SENSITIVE Sensitive     PENICILLIN (oral) <=0.06 SENSITIVE Sensitive     CEFTRIAXONE  (non-meningitis) <=0.12 SENSITIVE Sensitive     CEFTRIAXONE (meningitis) <=0.12 SENSITIVE Sensitive     * FEW STREPTOCOCCUS PNEUMONIAE  Culture, fungus without smear     Status: None (Preliminary result)   Collection Time: 04/30/21  2:57 AM   Specimen: CSF; Cerebrospinal Fluid  Result Value Ref Range Status   Specimen Description CSF  Final   Special Requests NONE  Final   Culture   Final    NO FUNGUS ISOLATED AFTER 6 DAYS Performed at [facility], [address], [place], [place] [address]    Report Status PENDING  Incomplete  MRSA PCR Screening     Status: Abnormal   Collection Time: 04/30/21  5:17 AM   Specimen: Nasal Mucosa; Nasopharyngeal  Result Value Ref Range Status   MRSA by PCR POSITIVE (A) NEGATIVE Final    Comment:        The GeneXpert MRSA Assay (FDA approved for NASAL specimens only), is one component of a comprehensive MRSA colonization surveillance program. It is not intended to diagnose MRSA infection nor to guide or monitor treatment for MRSA infections. RESULT CALLED TO, READ BACK BY AND VERIFIED WITH: PLUMBER,M RN 04/30/2021 AT 4709 SKEEN,P Performed at [facility], [place] [address] [place], [place] [address]   HSV 1/2 Ab IgG/IgM CSF     Status: None   Collection Time: 04/30/21  5:27 AM   Specimen: Cerebrospinal Fluid  Result Value Ref Range Status   HSV 1/2 Ab, IgM, CSF 0.66 <=0.89 IV Final    Comment: (NOTE) INTERPRETIVE INFORMATION: Herpes Simplex Virus                          Type 1 and/or 2 Antibodies,  IgM by ELISA, CSF  0.89 IV or Less .......... Negative: No significant                             level of detectable HSV IgM                             antibody.  0.90 - 1.09 IV ........... Equivocal: Questionable                             presence of IgM antibodies.                             Repeat testing in 10-14 days                             may be helpful.  1.10 IV or Greater .......  Positive: IgM antibody to HSV                             detected, which may indicate a                             current or recent infection.                             However, low levels of IgM                             antibodies may occasionally                             persist for more than 12                             months post-infection. The detection of antibodies to herpes simplex virus in CSF may indicate central nervou s system infection. However, consideration must be given to possible contamination by blood or transfer of serum antibodies across the blood-brain barrier. Fourfold or greater rise in CSF antibodies to herpes on specimens at least 4 weeks apart are found in 74-94 % of patients with herpes encephalitis. Specificity of the test based on a single CSF testing is not established. Presently PCR is the primary means of establishing a diagnosis of herpes encephalitis. This test was developed and its performance characteristics determined by [employer]. It has not been cleared or approved by the [place] Food and Drug Administration. This test was performed in a CLIA certified laboratory and is intended for clinical purposes.    HSV 1/2 Ab Screen IgG, CSF <0.34 <=0.89 IV Final    Comment: (NOTE) INTERPRETIVE INFORMATION: Herpes Simplex Virus Type 1 and/or 2                    Antibodies, IgG CSF  0.89 IV or Less .......... Negative: No significant                             level of  detectable HSV IgG                             antibody.  0.90 - 1.09 IV ........... Equivocal: Questionable                             presence of IgG antibodies.                             Repeat testing in 10-14 days                             may be helpful.  1.10 IV or Greater ....... Positive: IgG antibody to HSV                             detected, which may indicate                             a current or past HSV                             infection. The  detection of antibodies to herpes simplex virus in CSF may indicate central nervous system infection. However, consideration must be given to possible contamination by blood or transfer of serum antibodies across the blood-brain barrier. Fourfold or greater rise in CSF antibodies to herpes on specimens at l east 4 weeks apart are found in 74-94 % of patients with herpes encephalitis. Specificity of the test based on a single CSF testing is not established. Presently PCR is the primary means of establishing a diagnosis of herpes encephalitis. This test was developed and its performance characteristics determined by [employer]. It has not been cleared or approved by the [place] Food and Drug Administration. This test was performed in a CLIA certified laboratory and is intended for clinical purposes. Performed At: [facility] [address] [place], [place] [id number] Hilton Sinclair I MD [phone]   Culture, blood (Routine X 2) w Reflex to ID Panel     Status: None   Collection Time: 04/30/21 11:30 AM   Specimen: BLOOD  Result Value Ref Range Status   Specimen Description BLOOD LEFT ANTECUBITAL  Final   Special Requests   Final    BOTTLES DRAWN AEROBIC AND ANAEROBIC Blood Culture adequate volume   Culture   Final    NO GROWTH 5 DAYS Performed at [facility], [place] [address], [place], [place] [address]    Report Status 05/05/2021 FINAL  Final  Culture, blood (Routine X 2) w Reflex to ID Panel     Status: None   Collection Time: 04/30/21 11:47 AM   Specimen: BLOOD LEFT FOREARM  Result Value Ref Range Status   Specimen Description BLOOD LEFT FOREARM  Final   Special Requests   Final    BOTTLES DRAWN AEROBIC ONLY Blood Culture results may not be optimal due to an inadequate volume of blood received in culture bottles   Culture   Final    NO GROWTH 5 DAYS Performed at [facility], [place] [address] [place], [place] [address]    Report Status 05/05/2021  FINAL  Final    Studies/Results: CT HEAD WO CONTRAST  Result Date: 05/05/2021 CLINICAL DATA:  Bacterial meningitis EXAM: CT HEAD WITHOUT CONTRAST TECHNIQUE: Contiguous axial images were obtained from the base of the skull through the vertex without intravenous contrast. COMPARISON:  Recent MR and CT imaging FINDINGS: There is significant streak artifact through the posterior fossa and skull base. Brain: There is no acute intracranial hemorrhage, mass effect, or edema. Gray-white differentiation is preserved. There is no extra-axial fluid collection. Ventricles and sulci are stable in size and configuration. Vascular: Increased density of some of the dural venous sinuses. Skull: Calvarium is unremarkable. Sinuses/Orbits: No acute finding. Other: None. IMPRESSION: No acute intracranial hemorrhage, acute infarction, extra-axial collection, or hydrocephalus. There is increased density of some of the dural venous sinuses. This more likely reflects hemoconcentration than thrombus. MRV could be considered as indicated. Electronically Signed   By: Macy Mis M.D.   On: 05/05/2021 13:20   DG Chest Port 1 View  Result Date: 05/06/2021 CLINICAL DATA:  Respiratory failure.  Hypoxia. EXAM: PORTABLE CHEST 1 VIEW COMPARISON:  05/04/2021. FINDINGS: Endotracheal tube, right PICC line in stable position. NG tube tip below left hemidiaphragm. NG tube tip not imaged. Heart size normal. Low lung volumes with mild right base subsegmental atelectasis. No pleural effusion or pneumothorax. IMPRESSION: 1. Endotracheal tube right PICC line stable position. NG tube tip below left hemidiaphragm. NG tube tip not imaged. 2.  Low lung volumes with mild right base subsegmental atelectasis. Electronically Signed   By: Marcello Moores  Register   On: 05/06/2021 06:23      Assessment/Plan:  INTERVAL HISTORY:  Is developed focal neurological signs with worse movement on the left.  He has had a CT of the head and MRI V is pending to  assess for clot   Principal Problem:   Pneumococcal meningitis Active Problems:   AMS (altered mental status)   Meningitis   Acute respiratory failure with hypoxemia (HCC)   Status post peripherally inserted central catheter (PICC) central line placement    Hayden Mcbride is a 60 y.o. male with severe pneumococcal meningitis  #`1 pneumococcal meningitis:   Continue high-dose penicillin While he was not bacteremic will check TTE  MRV pending  Typical treatment for pneumococcal meningitis will be 2 weeks of therapy though if he has suggestion of septic thrombus and CNS will need to treated as an endovascular infection.  I spent greater than 35  minutes with the patient including greater than 50% of time in face to face counsel of the patient personally reviewing radiographs, laboratory and microbiological data  and in coordination of his care.     LOS: 6 days   Alcide Evener 05/06/2021, 3:16 PM

## 2021-05-06 NOTE — Progress Notes (Signed)
RT NOTE: held SBT this AM due to patient's HR being elevated.  Tolerating current ventilator settings well at this time.  Will continue to monitor.

## 2021-05-06 NOTE — Progress Notes (Signed)
NAME:  Hayden Mcbride, MRN:  [id number], DOB:  Apr 30, 1961, LOS: 6 ADMISSION DATE:  04/29/2021, CONSULTATION DATE:  05/06/21 REFERRING MD:  EDP, CHIEF COMPLAINT:  AMS   Brief summary:  60 year old male with sudden onset of headache and hearing loss  f/b AMS.  Recent return from 2 week trip to [place] early May.  Early CSF and MRI findings suggestive for bacterial meningitis.  ID consulted.    Pertinent  Medical History   has a past medical history of Allergy, Dry eyes, GERD (gastroesophageal reflux disease), Glaucoma, adenomatous colonic polyps (07/08/2006), Hypertension, Post-operative nausea and vomiting, Sleep apnea, and Thyroid disease.   Significant Hospital Events: Including procedures, antibiotic start and stop dates in addition to other pertinent events   . 5/17 presented to ED, intubated for agitation and airway protection . 5/18 PCCM consulted for admission, lumbar puncture completed, ID consulted.  EEG w/right hemispheric cortical dysfunction, no evidence of seizures.  Started on decadron; precedex off, remain on fentanyl gtt  . CT head 5/23 no evidence acute hemorrhage, infarct, extra-axial collection.  Some increased density dural venous sinuses, no clear evidence of clot  Tubes/lines ETT 5/17 >> Foley 5/17 >> 5/19  Antibiotics: Ampicillin 5/17>> 5/19 Ceftriaxone 5/17 (2gm q 12hr) >> 5/21 Vancomycin 5/17 >> 5/20 Acyclovir 5/18 Penicillin cont Infusion 5/21 >>   Micro: 5/18 CSF culture and gram stain>> S. Pneumoniae >> pan sensitive 5/18 CSF fungal cx>>         CSF HSV >> negative 5/18 BCx >> negative   Interim History / Subjective:   Fentanyl 100 I/O +9.7 L total Repeat head CT as above   Objective   Blood pressure 132/74, pulse 64, temperature 99.9 F (37.7 C), temperature source Axillary, resp. rate 18, height 5' 10.98" (1.803 m), weight 113.5 kg, SpO2 94 %.    Vent Mode: PRVC FiO2 (%):  [40 %] 40 % Set Rate:  [16 bmp] 16 bmp Vt Set:  [600 mL] 600  mL PEEP:  [5 cmH20] 5 cmH20 Plateau Pressure:  [12 cmH20-14 cmH20] 12 cmH20   Intake/Output Summary (Last 24 hours) at 05/06/2021 1324 Last data filed at 05/06/2021 0700 Gross per 24 hour  Intake 3206.35 ml  Output 2375 ml  Net 831.35 ml   Filed Weights   05/04/21 0420 05/05/21 0500 05/06/21 0500  Weight: 114.1 kg 114 kg 113.5 kg   General: Obese man, laying in bed HEENT: ET tube in good position, pupils equal, oropharynx clear Neuro: Opens eyes to voice and tracks, nodded to questions, moves his right hand and right foot.  Minimal movement on the left although he does withdraw CV: Irregular, distant, 110 PULM: Decreased both bases, no wheezing or crackles GI: Nondistended obese, positive bowel sounds Extremities: No edema Skin: No rash  Labs/imaging that I havepersonally reviewed  (right click and "Reselect all SmartList Selections" daily)   Labs and images reviewed 5/24  Resolved Hospital Problem list   Lactic acidosis Erythrocytosis Bradycardia  Assessment & Plan:   pneumococcal meningitis with encephalitis, encephalopathy -Appreciate neurology follow-up and evaluation.  Discussed with Dr. Leonel Ramsay this morning 5/24.  Given his focality on exam and the CT findings of possible dural venous sinus abnormality we will plan for MRI MRV today. -Continue penicillin infusion, appreciate ID assistance -Completed 4 days dexamethasone -Seizure precautions -Attempt to minimize sedating medications if possible.  Consider adding Precedex after his MRI 5/24 to facilitate decrease fentanyl.  He had some reported bradycardia earlier in the hospitalization  Severe  Sepsis due to pneumococcal meningitis, onset at admission 5/18 Associated Septic shock, on pressors 5/18 - 5/19 -Hemodynamics improved  Acute hypoxic respiratory failure -continue current MV support at 8 cc/kg.  Okay for PSV but no plans for extubation given his mental status.  Hopefully he will begin to turn around but  need to consider possible tracheostomy to facilitate airway protection if his neurological recovery remains slow -VAP prevention orders -PAD protocol for sedation, considering adding Precedex to his fentanyl as above  Atrial fibrillation/flutter.  Suspect secondary to stress state from his underlying illness.  Has been in and out.  Back to atrial fibrillation with stimulation on 5/24 -Continue scheduled metoprolol -Issue of anticoagulation will be complicated.  We have avoided given the risk for ICH in the setting of his meningitis.  If he has venous sinus opacity or clot on MRI then we will have to discuss with neurology the pros and cons of anticoagulation  Electrolyte imbalance Total body volume overload -following BMP and replacing as indicated. -Tolerated furosemide on 5/23, plan to repeat on 5/24   Thrombocytopenia, likely due to sepsis, improving Leukocytosis  -following CBC  History of hypertension -Continue amlodipine, losartan, metoprolol   Best practice (right click and "Reselect all SmartList Selections" daily)  Diet:  Tube Feed ; TF  Pain/Anxiety/Delirium protocol (if indicated): Yes (RASS goal 0) VAP protocol (if indicated): Yes DVT prophylaxis: SCD  GI prophylaxis: PPI Glucose control:  SSI Yes Central venous access:  N/A Arterial line:  N/A Foley:  N/A;  Mobility:  bed rest  PT consulted: N/A Last date of multidisciplinary goals of care discussion [pending] Code Status:  full code Disposition: ICU  Wife updated at bedside on 5/24  Labs   CBC: Recent Labs  Lab 04/29/21 2234 04/29/21 2240 05/02/21 0616 05/03/21 0456 05/04/21 0122 05/05/21 0215 05/06/21 0427  WBC 18.3*   < > 17.1* 17.2* 16.4* 18.1* 18.2*  NEUTROABS 16.9*  --   --   --   --   --   --   HGB 18.7*   < > 15.2 16.0 18.4* 19.2* 17.5*  HCT 55.4*   < > 46.6 48.9 55.5* 59.1* 54.9*  MCV 94.4   < > 96.9 96.8 95.0 95.9 97.7  PLT 142*   < > 107* 137* 148* 184 184   < > = values in this  interval not displayed.    Basic Metabolic Panel: Recent Labs  Lab 04/30/21 0735 05/01/21 0207 05/01/21 0436 05/03/21 0456 05/03/21 1742 05/04/21 0122 05/05/21 0215 05/06/21 0540  NA 138 141   < > 144 145 143 144 145  K 3.9 3.9   < > 4.2 4.6 4.5 4.3 4.2  CL 106 111   < > 110 112* 109 109 110  CO2 23 23   < > 28 27 28 28 30   GLUCOSE 127* 137*   < > 179* 194* 206* 201* 178*  BUN 18 24*   < > 28* 28* 26* 30* 36*  CREATININE 1.13 0.98   < > 0.83 0.66 0.78 0.79 0.81  CALCIUM 8.5* 8.3*   < > 8.0* 7.9* 7.9* 7.5* 7.3*  MG 1.6* 2.5*  --   --  2.5* 2.6*  --  2.4  PHOS 3.3 2.8  --   --   --  1.7* 2.4*  --    < > = values in this interval not displayed.   GFR: Estimated Creatinine Clearance: 122.7 mL/min (by C-G formula based on SCr of 0.81 mg/dL).  Recent Labs  Lab 04/29/21 2354 04/30/21 0735 04/30/21 1213 05/01/21 0207 05/03/21 0456 05/04/21 0122 05/05/21 0215 05/06/21 0427  WBC  --    < >  --    < > 17.2* 16.4* 18.1* 18.2*  LATICACIDVEN 3.1*  --  2.8*  --   --   --   --   --    < > = values in this interval not displayed.    Liver Function Tests: Recent Labs  Lab 04/29/21 2234 05/01/21 0207 05/05/21 0215  AST 32 19 43*  ALT 27 24 133*  ALKPHOS 50 37* 44  BILITOT 2.6* 0.4 0.6  PROT 7.0 6.1* 5.4*  ALBUMIN 4.0 2.8* 2.1*   No results for input(s): LIPASE, AMYLASE in the last 168 hours. No results for input(s): AMMONIA in the last 168 hours.  ABG    Component Value Date/Time   PHART 7.374 05/01/2021 0436   PCO2ART 43.0 05/01/2021 0436   PO2ART 105 05/01/2021 0436   HCO3 25.1 05/01/2021 0436   TCO2 26 05/01/2021 0436   ACIDBASEDEF 2.0 04/29/2021 2358   O2SAT 98.0 05/01/2021 0436     Coagulation Profile: Recent Labs  Lab 04/30/21 0027  INR 1.4*    Cardiac Enzymes: No results for input(s): CKTOTAL, CKMB, CKMBINDEX, TROPONINI in the last 168 hours.  HbA1C: Hgb A1c MFr Bld  Date/Time Value Ref Range Status  04/30/2021 07:39 AM 5.4 4.8 - 5.6 % Final     Comment:    (NOTE) Pre diabetes:          5.7%-6.4%  Diabetes:              >6.4%  Glycemic control for   <7.0% adults with diabetes     CBG: Recent Labs  Lab 05/05/21 1125 05/05/21 1600 05/05/21 1933 05/05/21 2316 05/06/21 0352  GLUCAP 197* 172* 189* 196* 139*    Independent CC time 32 minutes  Baltazar Apo, MD, PhD 05/06/2021, 7:28 AM [facility] [phone] or if no answer before 7:00PM call [phone] For any issues after 7:00PM please call eLink [phone]

## 2021-05-06 NOTE — Progress Notes (Signed)
Subjective: Continues to improve slightly  Exam: Vitals:   05/06/21 1100 05/06/21 1115  BP: (!) 132/95   Pulse: 88   Resp: 20   Temp:    SpO2: 98% 95%   Gen: In bed, NAD Resp: non-labored breathing, no acute distress Abd: soft, nt  Neuro: MS: opens eyes spontaneously, fixates, but does not reliably track. Follows commands to squeeze hand and wiggle toes, but more reliably on the right than left.  YQ:IHKVQ, Does not blink to threat Motor: he moves both arms and right leg spontaneously. Appears to have less movement in the left leg compared to right but does withdraw it.  Sensory:responds to nox stim x 4.   Pertinent Labs: 5/18 -  CSF Glucose < 20 CSF protein > 600 CSF WBC 510 CSF RBC 110  Impression: 60 yo M with strep pneumo meningitis. His is exam appears to be  focal with less movement on the left. Head CT with question of the venous sinnuses, and therefore I think repeat MR imaging would be helpful. Overall his exam appears to be improving as would be expected with response to antibiotics. I would expect slow continued improvement in mental status.   Recommendations: 1) MRI brain, MRV head 2) abx per ID 3) Will follow.   Roland Rack, MD [facility] [phone]  If 7pm- 7am, please page neurology on call as listed in [place].

## 2021-05-06 NOTE — Progress Notes (Signed)
Patient transported to MRI & back to room 3M07 on ventilator with no problems.

## 2021-05-07 ENCOUNTER — Inpatient Hospital Stay (HOSPITAL_COMMUNITY): Payer: Commercial Managed Care - PPO

## 2021-05-07 DIAGNOSIS — G001 Pneumococcal meningitis: Secondary | ICD-10-CM | POA: Diagnosis not present

## 2021-05-07 DIAGNOSIS — R4182 Altered mental status, unspecified: Secondary | ICD-10-CM | POA: Diagnosis not present

## 2021-05-07 DIAGNOSIS — G039 Meningitis, unspecified: Secondary | ICD-10-CM | POA: Diagnosis not present

## 2021-05-07 DIAGNOSIS — R509 Fever, unspecified: Secondary | ICD-10-CM

## 2021-05-07 DIAGNOSIS — J9601 Acute respiratory failure with hypoxia: Secondary | ICD-10-CM | POA: Diagnosis not present

## 2021-05-07 LAB — CBC
HCT: 56.7 % — ABNORMAL HIGH (ref 39.0–52.0)
Hemoglobin: 18.3 g/dL — ABNORMAL HIGH (ref 13.0–17.0)
MCH: 31.4 pg (ref 26.0–34.0)
MCHC: 32.3 g/dL (ref 30.0–36.0)
MCV: 97.4 fL (ref 80.0–100.0)
Platelets: 197 10*3/uL (ref 150–400)
RBC: 5.82 MIL/uL — ABNORMAL HIGH (ref 4.22–5.81)
RDW: 13.8 % (ref 11.5–15.5)
WBC: 25.4 10*3/uL — ABNORMAL HIGH (ref 4.0–10.5)
nRBC: 0 % (ref 0.0–0.2)

## 2021-05-07 LAB — GLUCOSE, CAPILLARY
Glucose-Capillary: 191 mg/dL — ABNORMAL HIGH (ref 70–99)
Glucose-Capillary: 165 mg/dL — ABNORMAL HIGH (ref 70–99)
Glucose-Capillary: 193 mg/dL — ABNORMAL HIGH (ref 70–99)
Glucose-Capillary: 206 mg/dL — ABNORMAL HIGH (ref 70–99)
Glucose-Capillary: 226 mg/dL — ABNORMAL HIGH (ref 70–99)
Glucose-Capillary: 174 mg/dL — ABNORMAL HIGH (ref 70–99)

## 2021-05-07 LAB — BASIC METABOLIC PANEL
Anion gap: 8 (ref 5–15)
BUN: 41 mg/dL — ABNORMAL HIGH (ref 8–23)
CO2: 27 mmol/L (ref 22–32)
Calcium: 6.8 mg/dL — ABNORMAL LOW (ref 8.9–10.3)
Chloride: 97 mmol/L — ABNORMAL LOW (ref 98–111)
Creatinine, Ser: 0.8 mg/dL (ref 0.61–1.24)
GFR, Estimated: >60 mL/min (ref >60)
Glucose, Bld: 213 mg/dL — ABNORMAL HIGH (ref 70–99)
Potassium: 4.3 mmol/L (ref 3.5–5.1)
Sodium: 132 mmol/L — ABNORMAL LOW (ref 135–145)

## 2021-05-07 LAB — PHOSPHORUS: Phosphorus: 3.2 mg/dL (ref 2.5–4.6)

## 2021-05-07 LAB — MAGNESIUM: Magnesium: 2.2 mg/dL (ref 1.7–2.4)

## 2021-05-07 IMAGING — DX DG CHEST 1V PORT
1 series · 1 of 1 positions shown · non-contrast
Comparison: Chest x-ray [DATE]

CLINICAL DATA: Acute respiratory failure.  Hypoxia

EXAM:
PORTABLE CHEST 1 VIEW

[chest ap]
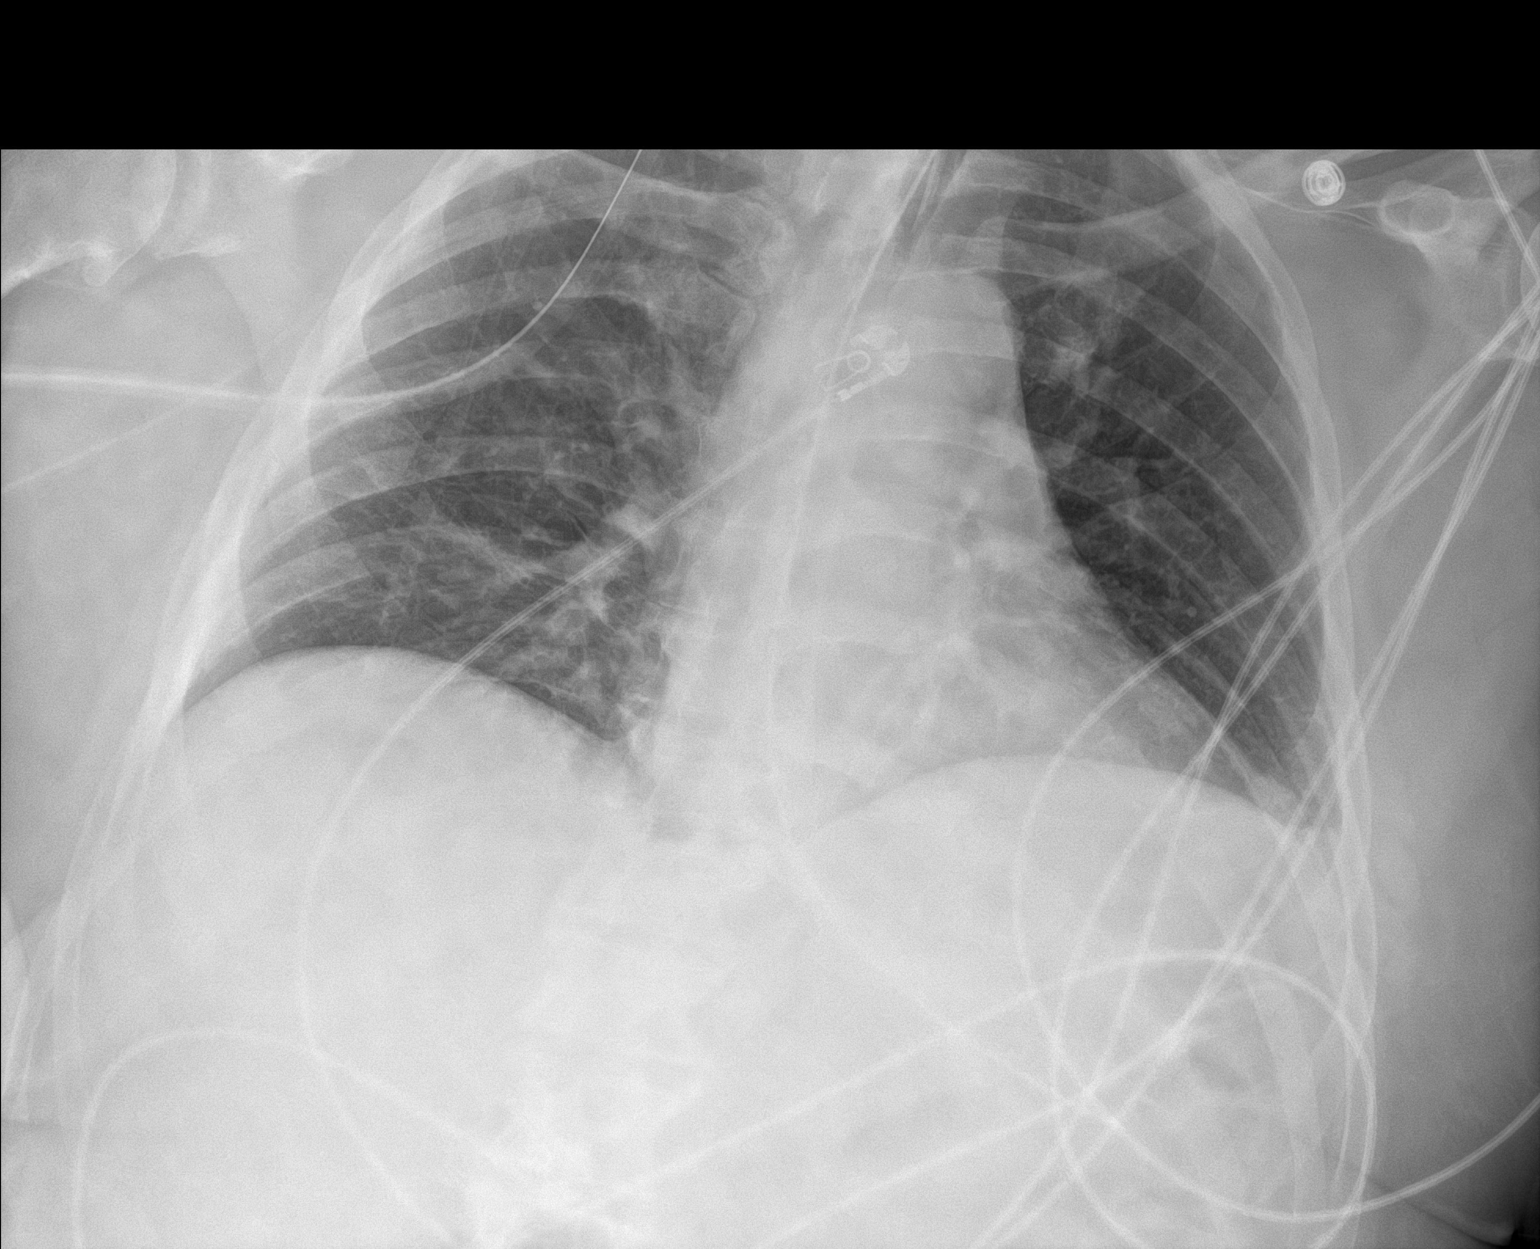

[1 of 1 positions shown; findings below may reference images not displayed]

FINDINGS: Enteric tube with tip terminating 6.3 cm above the carina. Enteric
tube coursing below the hemidiaphragm with tip overlying the gastric
lumen and side port not definitely identified. Right PICC not well
visualized.

The heart size and mediastinal contours are within normal limits.

Right lower lung zone opacity. Left base nodular density. No
pulmonary edema. No pleural effusion. No pneumothorax.

No acute osseous abnormality.
IMPRESSION: 1. Bilateral lower lobe nodular-like opacities.
2. Enteric tube with tip terminating 6.3 cm above the carina.
3. Enteric tube coursing below the hemidiaphragm with tip overlying
the gastric lumen and side port not definitely identified.
4. Right PICC not well visualized.

## 2021-05-07 MED ORDER — METOPROLOL TARTRATE 25 MG/10 ML ORAL SUSPENSION
25.0000 mg | Freq: Once | ORAL | Status: DC
  Filled 2021-05-07: qty 10

## 2021-05-07 MED ORDER — QUETIAPINE FUMARATE 25 MG PO TABS
25.0000 mg | ORAL_TABLET | Freq: Two times a day (BID) | ORAL | Status: DC
  Administered 2021-05-07 – 2021-05-08 (×2): 25 mg
  Filled 2021-05-07 (×2): qty 1

## 2021-05-07 MED ORDER — DEXMEDETOMIDINE HCL IN NACL 400 MCG/100ML IV SOLN
0.0000 ug/kg/h | INTRAVENOUS | Status: DC
  Administered 2021-05-07: 0.4 ug/kg/h via INTRAVENOUS
  Administered 2021-05-08: 0.6 ug/kg/h via INTRAVENOUS
  Administered 2021-05-08: 0.9 ug/kg/h via INTRAVENOUS
  Administered 2021-05-09: 1 ug/kg/h via INTRAVENOUS
  Administered 2021-05-09: 0.3 ug/kg/h via INTRAVENOUS
  Filled 2021-05-07 (×6): qty 100
  Filled 2021-05-07: qty 200

## 2021-05-07 MED ORDER — METOPROLOL TARTRATE 25 MG/10 ML ORAL SUSPENSION
75.0000 mg | Freq: Two times a day (BID) | ORAL | Status: DC
  Administered 2021-05-07 – 2021-05-08 (×2): 75 mg
  Filled 2021-05-07 (×6): qty 30

## 2021-05-07 MED ORDER — FUROSEMIDE 10 MG/ML IJ SOLN
40.0000 mg | Freq: Two times a day (BID) | INTRAMUSCULAR | Status: AC
  Administered 2021-05-07 (×2): 40 mg via INTRAVENOUS
  Filled 2021-05-07 (×2): qty 4

## 2021-05-07 NOTE — Progress Notes (Signed)
LTM EEG hooked up and running - no initial skin breakdown - push button tested - neuro notified. [facility] monitoring.  

## 2021-05-07 NOTE — Progress Notes (Signed)
NAME:  Hayden Mcbride, MRN:  [id number], DOB:  1961-12-11, LOS: 7 ADMISSION DATE:  04/29/2021, CONSULTATION DATE:  05/07/21 REFERRING MD:  EDP, CHIEF COMPLAINT:  AMS   Brief summary:  60 year old male with sudden onset of headache and hearing loss  f/b AMS.  Recent return from 2 week trip to [place] early May.  Early CSF and MRI findings suggestive for bacterial meningitis.  ID consulted.    Pertinent  Medical History   has a past medical history of Allergy, Dry eyes, GERD (gastroesophageal reflux disease), Glaucoma, adenomatous colonic polyps (07/08/2006), Hypertension, Post-operative nausea and vomiting, Sleep apnea, and Thyroid disease.   Significant Hospital Events: Including procedures, antibiotic start and stop dates in addition to other pertinent events   . 5/17 presented to ED, intubated for agitation and airway protection . 5/18 PCCM consulted for admission, lumbar puncture completed, ID consulted.  EEG w/right hemispheric cortical dysfunction, no evidence of seizures.  Started on decadron; precedex off, remain on fentanyl gtt  . CT head 5/23 no evidence acute hemorrhage, infarct, extra-axial collection.  Some increased density dural venous sinuses, no clear evidence of clot . MRI/V 5/24 >> numerous foci of restricted diffusion around the subarachnoid spaces consistent with ongoing bacterial meningitis small amount restricted diffusion in the fourth ventricle but no evidence of wide spread ventriculitis, no evidence for venous thrombosis  Tubes/lines ETT 5/17 >> Foley 5/17 >> 5/19  Antibiotics: Ampicillin 5/17>> 5/19 Ceftriaxone 5/17 (2gm q 12hr) >> 5/21 Vancomycin 5/17 >> 5/20 Acyclovir 5/18 Penicillin cont Infusion 5/21 >>   Micro: 5/18 CSF culture and gram stain>> S. Pneumoniae >> pan sensitive 5/18 CSF fungal cx>>         CSF HSV >> negative 5/18 BCx >> negative   Interim History / Subjective:   I/O  -9.6 L total Fentanyl 75-100 MRI/V as above   Objective    Blood pressure 123/78, pulse 83, temperature 97.9 F (36.6 C), temperature source Axillary, resp. rate (!) 23, height 5' 10.98" (1.803 m), weight 113.8 kg, SpO2 95 %.    Vent Mode: CPAP;PSV FiO2 (%):  [40 %] 40 % Set Rate:  [16 bmp] 16 bmp Vt Set:  [600 mL] 600 mL PEEP:  [5 cmH20] 5 cmH20 Pressure Support:  [10 cmH20] 10 cmH20 Plateau Pressure:  [12 cmH20-17 cmH20] 15 cmH20   Intake/Output Summary (Last 24 hours) at 05/07/2021 1013 Last data filed at 05/07/2021 0900 Gross per 24 hour  Intake 2709.05 ml  Output 2425 ml  Net 284.05 ml   Filed Weights   05/05/21 0500 05/06/21 0500 05/07/21 0500  Weight: 114 kg 113.5 kg 113.8 kg   General: Obese man, laying in bed HEENT: ET tube in good position, pupils equal, oropharynx clear Neuro: A bit more awake, able to move on the right and the left, follows commands, tolerate some PSV CV: Early irregular, 115 PULM: Decreased to both bases, otherwise clear, no wheeze GI: Nondistended, positive bowel sounds Extremities: No edema Skin: No rash  Labs/imaging that I havepersonally reviewed  (right click and "Reselect all SmartList Selections" daily)   Labs and images reviewed 5/25, note MRI/MRV as above Leukocytosis, electrolytes and renal function stable  Resolved Hospital Problem list   Lactic acidosis Erythrocytosis Bradycardia  Assessment & Plan:   pneumococcal meningitis with encephalitis, encephalopathy -Continue penicillin infusion, appreciate ID assistance -MRI/V reassuring.  Discussed with Dr. Leonel Ramsay and also patient's wife at bedside.  He is slowly making some neurological improvement, hopefully will continue to do  so. -Continue minimize sedating medications as able, currently on low-dose fentanyl -Completed 4 days dexamethasone -Seizure precautions -Consider Precedex if we feel that it would facilitate a smoother wake-up  Severe Sepsis due to pneumococcal meningitis, onset at admission 5/18 Associated Septic shock,  on pressors 5/18 - 5/19 -Hemodynamics improved, off pressors, following  Acute hypoxic respiratory failure -continue current MV.  8 cc/kg.  Okay for pressure support ventilation and he is tolerating.  Mental status will determine when we can consider extubation. -I discussed with his wife the possibility that tracheostomy might progress his care, allow him to be ventilator free, decrease sedation needs.  We will consider depending on the pace of his neurologic improvement -VAP prevention orders -PAD protocol for sedation  Atrial fibrillation/flutter.  Suspect secondary to stress state from his underlying illness.  Has been in and out.  Back to atrial fibrillation with stimulation on 5/24, 5/25 -Continue scheduled metoprolol, okay for metoprolol IV as needed -Deferring anticoagulation given the potential risk for ICH.  There was no evidence of venous sinus thrombosis on his MRI/V.  Electrolyte imbalance Total body volume overload -following BMP, replace electrolytes as indicated -Plan to continue gentle diuresis daily, tolerated on 5/23 and 5/24.   Thrombocytopenia, likely due to sepsis, improving Leukocytosis  -following CBC  History of hypertension -Continue amlodipine, losartan, metoprolol   Best practice (right click and "Reselect all SmartList Selections" daily)  Diet:  Tube Feed ; TF  Pain/Anxiety/Delirium protocol (if indicated): Yes (RASS goal 0) VAP protocol (if indicated): Yes DVT prophylaxis: SCD  GI prophylaxis: PPI Glucose control:  SSI Yes Central venous access:  N/A Arterial line:  N/A Foley:  N/A;  Mobility:  bed rest  PT consulted: N/A Last date of multidisciplinary goals of care discussion [pending] Code Status:  full code Disposition: ICU  Wife updated at bedside on 5/25  Labs   CBC: Recent Labs  Lab 05/03/21 0456 05/04/21 0122 05/05/21 0215 05/06/21 0427 05/07/21 0359  WBC 17.2* 16.4* 18.1* 18.2* 25.4*  HGB 16.0 18.4* 19.2* 17.5* 18.3*  HCT  48.9 55.5* 59.1* 54.9* 56.7*  MCV 96.8 95.0 95.9 97.7 97.4  PLT 137* 148* 184 184 315    Basic Metabolic Panel: Recent Labs  Lab 05/01/21 0207 05/01/21 0436 05/03/21 1742 05/04/21 0122 05/05/21 0215 05/06/21 0540 05/07/21 0359  NA 141   < > 145 143 144 145 132*  K 3.9   < > 4.6 4.5 4.3 4.2 4.3  CL 111   < > 112* 109 109 110 97*  CO2 23   < > 27 28 28 30 27   GLUCOSE 137*   < > 194* 206* 201* 178* 213*  BUN 24*   < > 28* 26* 30* 36* 41*  CREATININE 0.98   < > 0.66 0.78 0.79 0.81 0.80  CALCIUM 8.3*   < > 7.9* 7.9* 7.5* 7.3* 6.8*  MG 2.5*  --  2.5* 2.6*  --  2.4 2.2  PHOS 2.8  --   --  1.7* 2.4*  --  3.2   < > = values in this interval not displayed.   GFR: Estimated Creatinine Clearance: 124.4 mL/min (by C-G formula based on SCr of 0.8 mg/dL). Recent Labs  Lab 04/30/21 1213 05/01/21 0207 05/04/21 0122 05/05/21 0215 05/06/21 0427 05/07/21 0359  WBC  --    < > 16.4* 18.1* 18.2* 25.4*  LATICACIDVEN 2.8*  --   --   --   --   --    < > =  values in this interval not displayed.    Liver Function Tests: Recent Labs  Lab 05/01/21 0207 05/05/21 0215  AST 19 43*  ALT 24 133*  ALKPHOS 37* 44  BILITOT 0.4 0.6  PROT 6.1* 5.4*  ALBUMIN 2.8* 2.1*   No results for input(s): LIPASE, AMYLASE in the last 168 hours. No results for input(s): AMMONIA in the last 168 hours.  ABG    Component Value Date/Time   PHART 7.374 05/01/2021 0436   PCO2ART 43.0 05/01/2021 0436   PO2ART 105 05/01/2021 0436   HCO3 25.1 05/01/2021 0436   TCO2 26 05/01/2021 0436   ACIDBASEDEF 2.0 04/29/2021 2358   O2SAT 98.0 05/01/2021 0436     Coagulation Profile: No results for input(s): INR, PROTIME in the last 168 hours.  Cardiac Enzymes: No results for input(s): CKTOTAL, CKMB, CKMBINDEX, TROPONINI in the last 168 hours.  HbA1C: Hgb A1c MFr Bld  Date/Time Value Ref Range Status  04/30/2021 07:39 AM 5.4 4.8 - 5.6 % Final    Comment:    (NOTE) Pre diabetes:          5.7%-6.4%  Diabetes:               >6.4%  Glycemic control for   <7.0% adults with diabetes     CBG: Recent Labs  Lab 05/06/21 1509 05/06/21 1944 05/06/21 2335 05/07/21 0324 05/07/21 0731  GLUCAP 176* 177* 180* 191* 165*    Independent CC time 33 minutes  Baltazar Apo, MD, PhD 05/07/2021, 10:13 AM [facility] [phone] or if no answer before 7:00PM call [phone] For any issues after 7:00PM please call eLink [phone]

## 2021-05-07 NOTE — Progress Notes (Signed)
Subjective: On ventilator   Antibiotics:  Anti-infectives (From admission, onward)   Start     Dose/Rate Route Frequency Ordered Stop   05/03/21 1215  penicillin G potassium 12 Million Units in dextrose 5 % 500 mL continuous infusion        12 Million Units 41.7 mL/hr over 12 Hours Intravenous Every 12 hours 05/03/21 1123     05/01/21 2200  vancomycin (VANCOREADY) IVPB 1000 mg/200 mL  Status:  Discontinued        1,000 mg 200 mL/hr over 60 Minutes Intravenous Every 8 hours 05/01/21 1312 05/02/21 0902   05/01/21 1400  vancomycin (VANCOREADY) IVPB 1000 mg/200 mL        1,000 mg 200 mL/hr over 60 Minutes Intravenous NOW 05/01/21 1312 05/01/21 1523   04/30/21 1200  vancomycin (VANCOREADY) IVPB 1000 mg/200 mL  Status:  Discontinued        1,000 mg 200 mL/hr over 60 Minutes Intravenous Every 12 hours 04/29/21 2334 05/01/21 1312   04/30/21 0600  acyclovir (ZOVIRAX) 1,000 mg in dextrose 5 % 150 mL IVPB  Status:  Discontinued        1,000 mg 170 mL/hr over 60 Minutes Intravenous Every 8 hours 04/30/21 0243 04/30/21 1119   04/30/21 0000  ampicillin (OMNIPEN) 2 g in sodium chloride 0.9 % 100 mL IVPB  Status:  Discontinued        2 g 300 mL/hr over 20 Minutes Intravenous Every 4 hours 04/29/21 2330 05/01/21 0958   04/29/21 2345  cefTRIAXone (ROCEPHIN) 2 g in sodium chloride 0.9 % 100 mL IVPB  Status:  Discontinued        2 g 200 mL/hr over 30 Minutes Intravenous Every 12 hours 04/29/21 2330 05/03/21 1123   04/29/21 2345  vancomycin (VANCOREADY) IVPB 2000 mg/400 mL        2,000 mg 200 mL/hr over 120 Minutes Intravenous  Once 04/29/21 2334 04/30/21 0240      Medications: Scheduled Meds: . amLODipine  5 mg Per Tube Daily  . chlorhexidine gluconate (MEDLINE KIT)  15 mL Mouth Rinse BID  . Chlorhexidine Gluconate Cloth  6 each Topical Daily  . docusate  100 mg Per Tube BID  . feeding supplement (PROSource TF)  45 mL Per Tube TID  . fentaNYL (SUBLIMAZE) injection  50 mcg  Intravenous Once  . furosemide  40 mg Intravenous Q12H  . heparin injection (subcutaneous)  5,000 Units Subcutaneous Q8H  . insulin aspart  0-15 Units Subcutaneous Q4H  . losartan  100 mg Per Tube Daily  . mouth rinse  15 mL Mouth Rinse 10 times per day  . metoprolol tartrate  50 mg Per Tube BID  . pantoprazole sodium  40 mg Per Tube Daily  . polyethylene glycol  17 g Per Tube Daily  . QUEtiapine  50 mg Per Tube BID  . sodium chloride flush  10-40 mL Intracatheter Q12H   Continuous Infusions: . sodium chloride Stopped (05/05/21 0949)  . dexmedetomidine (PRECEDEX) IV infusion 0.4 mcg/kg/hr (05/07/21 1152)  . feeding supplement (VITAL AF 1.2 CAL) 1,000 mL (05/06/21 1957)  . fentaNYL infusion INTRAVENOUS 100 mcg/hr (05/07/21 0700)  . penicillin g continuous IV infusion 12 Million Units (05/07/21 1050)   PRN Meds:.sodium chloride, acetaminophen (TYLENOL) oral liquid 160 mg/5 mL, bisacodyl, fentaNYL, LORazepam, metoprolol tartrate, sodium chloride flush    Objective: Weight change: 0.3 kg  Intake/Output Summary (Last 24 hours) at 05/07/2021 1153 Last data filed at 05/07/2021 0900  Gross per 24 hour  Intake 2589.54 ml  Output 2425 ml  Net 164.54 ml   Blood pressure 123/78, pulse 83, temperature (!) 100.6 F (38.1 C), temperature source Axillary, resp. rate (!) 23, height 5' 10.98" (1.803 m), weight 113.8 kg, SpO2 95 %. Temp:  [97.9 F (36.6 C)-102.7 F (39.3 C)] 100.6 F (38.1 C) (05/25 1132) Pulse Rate:  [69-157] 83 (05/25 0900) Resp:  [8-24] 23 (05/25 0900) BP: (101-152)/(51-130) 123/78 (05/25 0900) SpO2:  [91 %-97 %] 95 % (05/25 1030) FiO2 (%):  [40 %] 40 % (05/25 1030) Weight:  [113.8 kg] 113.8 kg (05/25 0500)  Physical Exam: Physical Exam Constitutional:      Appearance: He is well-developed. He is ill-appearing.     Interventions: He is intubated.  HENT:     Head: Normocephalic and atraumatic.  Eyes:     General:        Right eye: No discharge.        Left eye: No  discharge.     Extraocular Movements: Extraocular movements intact.     Conjunctiva/sclera: Conjunctivae normal.  Cardiovascular:     Rate and Rhythm: Regular rhythm. Tachycardia present.     Heart sounds: No murmur heard. No gallop.   Pulmonary:     Effort: Pulmonary effort is normal. No respiratory distress. He is intubated.     Breath sounds: No wheezing.  Abdominal:     General: There is no distension.     Palpations: Abdomen is soft. There is no mass.  Musculoskeletal:        General: Normal range of motion.     Cervical back: Normal range of motion and neck supple.  Skin:    General: Skin is warm and dry.     Findings: No erythema or rash.  Neurological:     Mental Status: He is alert.     Lines do not appear overtly infected CBC:    BMET Recent Labs    05/06/21 0540 05/07/21 0359  NA 145 132*  K 4.2 4.3  CL 110 97*  CO2 30 27  GLUCOSE 178* 213*  BUN 36* 41*  CREATININE 0.81 0.80  CALCIUM 7.3* 6.8*     Liver Panel  Recent Labs    05/05/21 0215  PROT 5.4*  ALBUMIN 2.1*  AST 43*  ALT 133*  ALKPHOS 44  BILITOT 0.6       Sedimentation Rate No results for input(s): ESRSEDRATE in the last 72 hours. C-Reactive Protein No results for input(s): CRP in the last 72 hours.  Micro Results: Recent Results (from the past 720 hour(s))  SARS CORONAVIRUS 2 (TAT 6-24 HRS) Nasopharyngeal Nasopharyngeal Swab     Status: None   Collection Time: 04/29/21 10:57 PM   Specimen: Nasopharyngeal Swab  Result Value Ref Range Status   SARS Coronavirus 2 NEGATIVE NEGATIVE Final    Comment: (NOTE) SARS-CoV-2 target nucleic acids are NOT DETECTED.  The SARS-CoV-2 RNA is generally detectable in upper and lower respiratory specimens during the acute phase of infection. Negative results do not preclude SARS-CoV-2 infection, do not rule out co-infections with other pathogens, and should not be used as the sole basis for treatment or other patient management  decisions. Negative results must be combined with clinical observations, patient history, and epidemiological information. The expected result is Negative.  Fact Sheet for Patients: SugarRoll.be  Fact Sheet for Healthcare Providers: https://www.woods-mathews.com/  This test is not yet approved or cleared by the [place] FDA and  has  been authorized for detection and/or diagnosis of SARS-CoV-2 by FDA under an Emergency Use Authorization (EUA). This EUA will remain  in effect (meaning this test can be used) for the duration of the COVID-19 declaration under Se ction 564(b)(1) of the Act, 21 U.S.C. section 360bbb-3(b)(1), unless the authorization is terminated or revoked sooner.  Performed at [facility], [place] [address], [place], [place] [address]   CSF culture     Status: None   Collection Time: 04/30/21  2:57 AM   Specimen: CSF; Cerebrospinal Fluid  Result Value Ref Range Status   Specimen Description CSF  Final   Special Requests Normal  Final   Gram Stain   Final    GRAM POSITIVE COCCI WBC PRESENT, PREDOMINANTLY MONONUCLEAR CRITICAL RESULT CALLED TO, READ BACK BY AND VERIFIED WITH: RN MEGAN PLUMMER BY MESSAN H. AT 0345 ON 5 18 2022 CYTOSPIN SMEAR Performed at [facility], [place] [address] [place], [place] [address]    Culture FEW STREPTOCOCCUS PNEUMONIAE  Final   Report Status 05/02/2021 FINAL  Final   Organism ID, Bacteria STREPTOCOCCUS PNEUMONIAE  Final      Susceptibility   Streptococcus pneumoniae - MIC*    ERYTHROMYCIN <=0.12 SENSITIVE Sensitive     LEVOFLOXACIN 0.5 SENSITIVE Sensitive     VANCOMYCIN 0.5 SENSITIVE Sensitive     PENICILLIN (meningitis) <=0.06 SENSITIVE Sensitive     PENO - penicillin <=0.06      PENICILLIN (non-meningitis) <=0.06 SENSITIVE Sensitive     PENICILLIN (oral) <=0.06 SENSITIVE Sensitive     CEFTRIAXONE (non-meningitis) <=0.12 SENSITIVE Sensitive     CEFTRIAXONE (meningitis)  <=0.12 SENSITIVE Sensitive     * FEW STREPTOCOCCUS PNEUMONIAE  Culture, fungus without smear     Status: None (Preliminary result)   Collection Time: 04/30/21  2:57 AM   Specimen: CSF; Cerebrospinal Fluid  Result Value Ref Range Status   Specimen Description CSF  Final   Special Requests NONE  Final   Culture   Final    NO FUNGUS ISOLATED AFTER 6 DAYS Performed at [facility], [address], [place], [place] [address]    Report Status PENDING  Incomplete  MRSA PCR Screening     Status: Abnormal   Collection Time: 04/30/21  5:17 AM   Specimen: Nasal Mucosa; Nasopharyngeal  Result Value Ref Range Status   MRSA by PCR POSITIVE (A) NEGATIVE Final    Comment:        The GeneXpert MRSA Assay (FDA approved for NASAL specimens only), is one component of a comprehensive MRSA colonization surveillance program. It is not intended to diagnose MRSA infection nor to guide or monitor treatment for MRSA infections. RESULT CALLED TO, READ BACK BY AND VERIFIED WITH: PLUMBER,M RN 04/30/2021 AT 7989 SKEEN,P Performed at [facility], [place] [address] [place], [place] [address]   HSV 1/2 Ab IgG/IgM CSF     Status: None   Collection Time: 04/30/21  5:27 AM   Specimen: Cerebrospinal Fluid  Result Value Ref Range Status   HSV 1/2 Ab, IgM, CSF 0.66 <=0.89 IV Final    Comment: (NOTE) INTERPRETIVE INFORMATION: Herpes Simplex Virus                          Type 1 and/or 2 Antibodies,                          IgM by ELISA,  CSF  0.89 IV or Less .......... Negative: No significant                             level of detectable HSV IgM                             antibody.  0.90 - 1.09 IV ........... Equivocal: Questionable                             presence of IgM antibodies.                             Repeat testing in 10-14 days                             may be helpful.  1.10 IV or Greater ....... Positive: IgM antibody to HSV                             detected, which may  indicate a                             current or recent infection.                             However, low levels of IgM                             antibodies may occasionally                             persist for more than 12                             months post-infection. The detection of antibodies to herpes simplex virus in CSF may indicate central nervou s system infection. However, consideration must be given to possible contamination by blood or transfer of serum antibodies across the blood-brain barrier. Fourfold or greater rise in CSF antibodies to herpes on specimens at least 4 weeks apart are found in 74-94 % of patients with herpes encephalitis. Specificity of the test based on a single CSF testing is not established. Presently PCR is the primary means of establishing a diagnosis of herpes encephalitis. This test was developed and its performance characteristics determined by [employer]. It has not been cleared or approved by the [place] Food and Drug Administration. This test was performed in a CLIA certified laboratory and is intended for clinical purposes.    HSV 1/2 Ab Screen IgG, CSF <0.34 <=0.89 IV Final    Comment: (NOTE) INTERPRETIVE INFORMATION: Herpes Simplex Virus Type 1 and/or 2                    Antibodies, IgG CSF  0.89 IV or Less .......... Negative: No significant                             level of detectable HSV IgG  antibody.  0.90 - 1.09 IV ........... Equivocal: Questionable                             presence of IgG antibodies.                             Repeat testing in 10-14 days                             may be helpful.  1.10 IV or Greater ....... Positive: IgG antibody to HSV                             detected, which may indicate                             a current or past HSV                             infection. The detection of antibodies to herpes simplex virus in CSF may indicate central  nervous system infection. However, consideration must be given to possible contamination by blood or transfer of serum antibodies across the blood-brain barrier. Fourfold or greater rise in CSF antibodies to herpes on specimens at l east 4 weeks apart are found in 74-94 % of patients with herpes encephalitis. Specificity of the test based on a single CSF testing is not established. Presently PCR is the primary means of establishing a diagnosis of herpes encephalitis. This test was developed and its performance characteristics determined by [employer]. It has not been cleared or approved by the [place] Food and Drug Administration. This test was performed in a CLIA certified laboratory and is intended for clinical purposes. Performed At: [facility] [address] [place], [place] [id number] Hilton Sinclair I MD [phone]   Culture, blood (Routine X 2) w Reflex to ID Panel     Status: None   Collection Time: 04/30/21 11:30 AM   Specimen: BLOOD  Result Value Ref Range Status   Specimen Description BLOOD LEFT ANTECUBITAL  Final   Special Requests   Final    BOTTLES DRAWN AEROBIC AND ANAEROBIC Blood Culture adequate volume   Culture   Final    NO GROWTH 5 DAYS Performed at [facility], [place] [address], [place], [place] [address]    Report Status 05/05/2021 FINAL  Final  Culture, blood (Routine X 2) w Reflex to ID Panel     Status: None   Collection Time: 04/30/21 11:47 AM   Specimen: BLOOD LEFT FOREARM  Result Value Ref Range Status   Specimen Description BLOOD LEFT FOREARM  Final   Special Requests   Final    BOTTLES DRAWN AEROBIC ONLY Blood Culture results may not be optimal due to an inadequate volume of blood received in culture bottles   Culture   Final    NO GROWTH 5 DAYS Performed at [facility], [place] [address] [place], [place] [address]    Report Status 05/05/2021 FINAL  Final    Studies/Results: MR BRAIN W WO CONTRAST  Result Date:  05/06/2021 CLINICAL DATA:  Follow-up dural venous thrombosis. Bacterial meningitis. EXAM: MRI HEAD WITHOUT AND WITH CONTRAST TECHNIQUE:  Multiplanar, multiecho pulse sequences of the brain and surrounding structures were obtained without and with intravenous contrast. CONTRAST:  63mL GADAVIST GADOBUTROL 1 MMOL/ML IV SOLN COMPARISON:  Head CT yesterday.  MRI 04/30/2021 FINDINGS: Brain: There is a constellation of findings consistent with bacterial meningitis. Diffusion imaging does not show any restricted diffusion affecting the brain parenchyma to indicate infarction. Numerous septations/synechiae are noted within the subarachnoid spaces, and there scattered discrete foci of restricted diffusion within the subarachnoid spaces consistent with bacterial meningitis. These appear progressive compared to the study of 6 days ago. No evidence of ventriculitis of the lateral or third ventricles. Some restricted diffusion material present in the fourth ventricle. No evidence change in ventricular size. Small focus of hemosiderin deposition in the left temporoparietal junction region is unchanged, possibly pre-existing the current infection. After contrast administration, there is a remarkable absence of abnormal contrast enhancement of the leptomeninges. Vascular: Major vessels at the base of the brain show flow. Postcontrast imaging does not show evidence of venous thrombosis. Skull and upper cervical spine: No primary bone pathology. Sinuses/Orbits: Paranasal sinuses are clear. Fluid in left mastoid region and middle ear, similar to the previous exam. Other: None IMPRESSION: Findings consistent with the clinical diagnosis of bacterial meningitis. Since the previous study, the patient has developed more numerous foci of restricted diffusion scattered about the subarachnoid spaces, with areas of septation and synechiae formation consistent with ongoing bacterial meningitis. Small amount of restricted diffusion material  present in the fourth ventricle, but no evidence of widespread ventriculitis. No sign of venous thrombosis. No sign of brain parenchymal infarction. No change in ventricular size. Remarkable absence of abnormal leptomeningeal contrast enhancement. Electronically Signed   By: Nelson Chimes M.D.   On: 05/06/2021 17:23   MR Venogram Head  Result Date: 05/06/2021 CLINICAL DATA:  Bacterial meningitis.  Assess for venous thrombosis. EXAM: MR VENOGRAM HEAD WITHOUT AND WITH CONTRAST TECHNIQUE: Angiographic images of the intracranial venous structures were acquired using MRV technique without intravenous contrast. COMPARISON:  MRI brain same day.  Head CT yesterday. FINDINGS: No evidence intracranial venous thrombosis. Dominant right transverse sinus and jugular system, with a diminutive left transverse sinus, sigmoid sinus and jugular vein, but without evidence of thrombosis, particularly when correlated with the postcontrast images from the brain MRI same day. IMPRESSION: No intracranial venous thrombosis. The patient shows dominance of the right transverse sinus and jugular system. The left transverse sinus is diminutive, but does not show evidence of thrombosis, particularly given the postcontrast imaging. Electronically Signed   By: Nelson Chimes M.D.   On: 05/06/2021 17:27   DG Chest Port 1 View  Result Date: 05/07/2021 CLINICAL DATA:  Acute respiratory failure.  Hypoxia EXAM: PORTABLE CHEST 1 VIEW COMPARISON:  Chest x-ray 05/06/2021 FINDINGS: Enteric tube with tip terminating 6.3 cm above the carina. Enteric tube coursing below the hemidiaphragm with tip overlying the gastric lumen and side port not definitely identified. Right PICC not well visualized. The heart size and mediastinal contours are within normal limits. Right lower lung zone opacity. Left base nodular density. No pulmonary edema. No pleural effusion. No pneumothorax. No acute osseous abnormality. IMPRESSION: 1. Bilateral lower lobe nodular-like  opacities. 2. Enteric tube with tip terminating 6.3 cm above the carina. 3. Enteric tube coursing below the hemidiaphragm with tip overlying the gastric lumen and side port not definitely identified. 4. Right PICC not well visualized. Electronically Signed   By: Iven Finn M.D.   On: 05/07/2021 04:31   DG Chest [facility]  1 View  Result Date: 05/06/2021 CLINICAL DATA:  Respiratory failure.  Hypoxia. EXAM: PORTABLE CHEST 1 VIEW COMPARISON:  05/04/2021. FINDINGS: Endotracheal tube, right PICC line in stable position. NG tube tip below left hemidiaphragm. NG tube tip not imaged. Heart size normal. Low lung volumes with mild right base subsegmental atelectasis. No pleural effusion or pneumothorax. IMPRESSION: 1. Endotracheal tube right PICC line stable position. NG tube tip below left hemidiaphragm. NG tube tip not imaged. 2.  Low lung volumes with mild right base subsegmental atelectasis. Electronically Signed   By: Marcello Moores  Register   On: 05/06/2021 06:23      Assessment/Plan:  INTERVAL HISTORY:  MRI I of the brain and MRV does not show evidence of a thrombus or brain abscess  Fevers have jumped in the last 2 days  Principal Problem:   Pneumococcal meningitis Active Problems:   AMS (altered mental status)   Meningitis   Acute respiratory failure with hypoxia ([place])   Status post peripherally inserted central catheter (PICC) central line placement    Hayden Mcbride is a 60 y.o. male with severe pneumococcal meningitis  #`1 Pneumococcal meningitis:   Continue high-dose penicillin  MRV was reassuring.  #2 Fevers: Not clear what the cause of his high fevers is we will send a set of blood cultures and a tracheal aspirate he does have some nodular opacities in the lungs  I spent greater than 35 minutes with the patient including greater than 50% of time in face to face reviewing radiographs personally [place] microbiologic data and in coordination of his care.     LOS: 7 days    Alcide Evener 05/07/2021, 11:53 AM

## 2021-05-07 NOTE — Progress Notes (Addendum)
Subjective: Continues to improve slowly  Exam: Vitals:   05/07/21 0833 05/07/21 0900  BP:  123/78  Pulse:  83  Resp:  (!) 23  Temp:    SpO2: 95% 95%   Gen: In bed, NAD Resp: non-labored breathing, no acute distress Abd: soft, nt  Neuro: MS: opens eyes spontaneously, fixates, but does not reliably track. Follows commands to squeeze hand and wiggle toes, but more reliably on the right than left.  MB:PJPET pupil minimally bigger than left, but within range of normal,  Appears to fixate, and blinks to threat inconsistently. Does not reliably track in weither dirrection.  Motor: he moves all extremities with 4/5 weaknsss, less asymmetry today.  Sensory:responds to nox stim x 4.   Pertinent Labs: 5/18 -  CSF Glucose < 20 CSF protein > 600 CSF WBC 510 CSF RBC 110  Impression: 60 yo M with strep pneumo meningitis. MRI did not reveal any septic thrombosis. Overall his exam appears to be improving as would be expected with response to antibiotics. I would expect slow continued improvement in mental status. With some question of focality previously and no definite explanation I think overnight EEG to exclude recurrent seizures would be prudent. If this is negative, then I would not pursue it any further.   Recommendations: 1) abx per ID 2) Will follow.   Roland Rack, MD [facility] [phone]  If 7pm- 7am, please page neurology on call as listed in [place].

## 2021-05-08 DIAGNOSIS — J15211 Pneumonia due to Methicillin susceptible Staphylococcus aureus: Secondary | ICD-10-CM

## 2021-05-08 DIAGNOSIS — J9601 Acute respiratory failure with hypoxia: Secondary | ICD-10-CM | POA: Diagnosis not present

## 2021-05-08 DIAGNOSIS — Z4659 Encounter for fitting and adjustment of other gastrointestinal appliance and device: Secondary | ICD-10-CM

## 2021-05-08 DIAGNOSIS — G001 Pneumococcal meningitis: Secondary | ICD-10-CM | POA: Diagnosis not present

## 2021-05-08 DIAGNOSIS — R4182 Altered mental status, unspecified: Secondary | ICD-10-CM | POA: Diagnosis not present

## 2021-05-08 LAB — CBC
HCT: 51.2 % (ref 39.0–52.0)
Hemoglobin: 16.7 g/dL (ref 13.0–17.0)
MCH: 31.6 pg (ref 26.0–34.0)
MCHC: 32.6 g/dL (ref 30.0–36.0)
MCV: 97 fL (ref 80.0–100.0)
Platelets: 191 10*3/uL (ref 150–400)
RBC: 5.28 MIL/uL (ref 4.22–5.81)
RDW: 14 % (ref 11.5–15.5)
WBC: 36.1 10*3/uL — ABNORMAL HIGH (ref 4.0–10.5)
nRBC: 0 % (ref 0.0–0.2)

## 2021-05-08 LAB — BASIC METABOLIC PANEL
Anion gap: 10 (ref 5–15)
Anion gap: 7 (ref 5–15)
Anion gap: 8 (ref 5–15)
BUN: 62 mg/dL — ABNORMAL HIGH (ref 8–23)
BUN: 64 mg/dL — ABNORMAL HIGH (ref 8–23)
BUN: 65 mg/dL — ABNORMAL HIGH (ref 8–23)
CO2: 23 mmol/L (ref 22–32)
CO2: 27 mmol/L (ref 22–32)
CO2: 27 mmol/L (ref 22–32)
Calcium: 6.8 mg/dL — ABNORMAL LOW (ref 8.9–10.3)
Calcium: 7.1 mg/dL — ABNORMAL LOW (ref 8.9–10.3)
Calcium: 7.6 mg/dL — ABNORMAL LOW (ref 8.9–10.3)
Chloride: 104 mmol/L (ref 98–111)
Chloride: 112 mmol/L — ABNORMAL HIGH (ref 98–111)
Chloride: 112 mmol/L — ABNORMAL HIGH (ref 98–111)
Creatinine, Ser: 0.96 mg/dL (ref 0.61–1.24)
Creatinine, Ser: 1.13 mg/dL (ref 0.61–1.24)
Creatinine, Ser: 1.16 mg/dL (ref 0.61–1.24)
GFR, Estimated: >60 mL/min (ref >60)
GFR, Estimated: >60 mL/min (ref >60)
GFR, Estimated: >60 mL/min (ref >60)
Glucose, Bld: 138 mg/dL — ABNORMAL HIGH (ref 70–99)
Glucose, Bld: 171 mg/dL — ABNORMAL HIGH (ref 70–99)
Glucose, Bld: 378 mg/dL — ABNORMAL HIGH (ref 70–99)
Potassium: 3.4 mmol/L — ABNORMAL LOW (ref 3.5–5.1)
Potassium: 4.1 mmol/L (ref 3.5–5.1)
Potassium: 6.7 mmol/L (ref 3.5–5.1)
Sodium: 138 mmol/L (ref 135–145)
Sodium: 145 mmol/L (ref 135–145)
Sodium: 147 mmol/L — ABNORMAL HIGH (ref 135–145)

## 2021-05-08 LAB — GLUCOSE, CAPILLARY
Glucose-Capillary: 146 mg/dL — ABNORMAL HIGH (ref 70–99)
Glucose-Capillary: 162 mg/dL — ABNORMAL HIGH (ref 70–99)
Glucose-Capillary: 182 mg/dL — ABNORMAL HIGH (ref 70–99)
Glucose-Capillary: 189 mg/dL — ABNORMAL HIGH (ref 70–99)
Glucose-Capillary: 197 mg/dL — ABNORMAL HIGH (ref 70–99)
Glucose-Capillary: 199 mg/dL — ABNORMAL HIGH (ref 70–99)

## 2021-05-08 MED ORDER — FUROSEMIDE 10 MG/ML IJ SOLN
40.0000 mg | Freq: Two times a day (BID) | INTRAMUSCULAR | Status: AC
  Administered 2021-05-08 – 2021-05-09 (×2): 40 mg via INTRAVENOUS
  Filled 2021-05-08 (×3): qty 4

## 2021-05-08 MED ORDER — VANCOMYCIN HCL 1500 MG/300ML IV SOLN
1500.0000 mg | Freq: Two times a day (BID) | INTRAVENOUS | Status: DC
  Administered 2021-05-09: 1500 mg via INTRAVENOUS
  Filled 2021-05-08: qty 300

## 2021-05-08 MED ORDER — POTASSIUM CHLORIDE 20 MEQ PO PACK
40.0000 meq | PACK | Freq: Once | ORAL | Status: AC
  Administered 2021-05-08: 40 meq
  Filled 2021-05-08: qty 2

## 2021-05-08 MED ORDER — INSULIN ASPART 100 UNIT/ML IJ SOLN
0.0000 [IU] | INTRAMUSCULAR | Status: DC
  Administered 2021-05-08: 3 [IU] via SUBCUTANEOUS
  Administered 2021-05-08 (×3): 4 [IU] via SUBCUTANEOUS
  Administered 2021-05-09 (×2): 3 [IU] via SUBCUTANEOUS
  Administered 2021-05-09 (×2): 4 [IU] via SUBCUTANEOUS
  Administered 2021-05-09: 3 [IU] via SUBCUTANEOUS
  Administered 2021-05-10: 4 [IU] via SUBCUTANEOUS
  Administered 2021-05-10 (×2): 3 [IU] via SUBCUTANEOUS
  Administered 2021-05-10: 4 [IU] via SUBCUTANEOUS
  Administered 2021-05-10 – 2021-05-11 (×3): 3 [IU] via SUBCUTANEOUS

## 2021-05-08 MED ORDER — FREE WATER
200.0000 mL | Freq: Three times a day (TID) | Status: DC
  Administered 2021-05-08 – 2021-05-09 (×3): 200 mL

## 2021-05-08 MED ORDER — VANCOMYCIN HCL 2000 MG/400ML IV SOLN
2000.0000 mg | Freq: Once | INTRAVENOUS | Status: AC
  Administered 2021-05-08: 2000 mg via INTRAVENOUS
  Filled 2021-05-08: qty 400

## 2021-05-08 MED ORDER — LOSARTAN POTASSIUM 50 MG PO TABS
50.0000 mg | ORAL_TABLET | Freq: Every day | ORAL | Status: DC
  Administered 2021-05-09: 50 mg
  Filled 2021-05-08: qty 1

## 2021-05-08 NOTE — Progress Notes (Signed)
Pharmacy Antibiotic Note  Hayden Mcbride is a 60 y.o. male admitted on 04/29/2021 presenting as code stroke, pneumococcal meningitis. Pharmacy has been consulted for vancomycin dosing for Staph aureus growing in his respiratory cultures while we wait on sensitivities   Expected AUC 491, Vd 80.5, t1/2 9.1  Plan: Continue penicillin 24 million units/hr Vancomycin 2000mg  x1  Vancomycin 1500mg  q12h F/u cultures  F/u Scr and need for levels    Height: 5' 10.98" (180.3 cm) Weight: 111.8 kg (246 lb 7.6 oz) IBW/kg (Calculated) : 75.26  Temp (24hrs), Avg:100.3 F (37.9 C), Min:98.9 F (37.2 C), Max:101.5 F (38.6 C)  Recent Labs  Lab 05/04/21 0122 05/05/21 0215 05/06/21 0427 05/06/21 0540 05/07/21 0359 05/07/21 2355 05/08/21 0534 05/08/21 0830 05/08/21 1000  WBC 16.4* 18.1* 18.2*  --  25.4*  --  36.1*  --   --   CREATININE 0.78 0.79  --  0.81 0.80 1.16  --  1.13 0.96    Estimated Creatinine Clearance: 102.8 mL/min (by C-G formula based on SCr of 0.96 mg/dL).    Allergies  Allergen Reactions  . Demerol [Meperidine]     Large doses cause nausea and vomiting    Nicoletta Dress, PharmD, BCIDP Infectious Disease Pharmacist  Phone: [phone] 05/08/2021 1:54 PM

## 2021-05-08 NOTE — Progress Notes (Signed)
NAME:  Hayden Mcbride, MRN:  [id number], DOB:  1961-05-04, LOS: 8 ADMISSION DATE:  04/29/2021, CONSULTATION DATE:  05/08/21 REFERRING MD:  EDP, CHIEF COMPLAINT:  AMS   Brief summary:  60 year old male with sudden onset of headache and hearing loss  f/b AMS.  Recent return from 2 week trip to [place] early May.  Early CSF and MRI findings suggestive for bacterial meningitis.  ID consulted.    Pertinent  Medical History   has a past medical history of Allergy, Dry eyes, GERD (gastroesophageal reflux disease), Glaucoma, adenomatous colonic polyps (07/08/2006), Hypertension, Post-operative nausea and vomiting, Sleep apnea, and Thyroid disease.   Significant Hospital Events: Including procedures, antibiotic start and stop dates in addition to other pertinent events   . 5/17 presented to ED, intubated for agitation and airway protection . 5/18 PCCM consulted for admission, lumbar puncture completed, ID consulted.  EEG w/right hemispheric cortical dysfunction, no evidence of seizures.  Started on decadron; precedex off, remain on fentanyl gtt  . CT head 5/23 no evidence acute hemorrhage, infarct, extra-axial collection.  Some increased density dural venous sinuses, no clear evidence of clot . MRI/V 5/24 >> numerous foci of restricted diffusion around the subarachnoid spaces consistent with ongoing bacterial meningitis small amount restricted diffusion in the fourth ventricle but no evidence of wide spread ventriculitis, no evidence for venous thrombosis  Tubes/lines ETT 5/17 >> Foley 5/17 >> 5/19  Antibiotics: Ampicillin 5/17>> 5/19 Ceftriaxone 5/17 (2gm q 12hr) >> 5/21 Vancomycin 5/17 >> 5/20 Acyclovir 5/18 Penicillin cont Infusion 5/21 >>   Micro: 5/18 CSF culture and gram stain>> S. Pneumoniae >> pan sensitive 5/18 CSF fungal cx>>         CSF HSV >> negative 5/18 BCx >> negative 5/25 blood >>  5/25 resp >> abundant GPC, rare yeast >>    Interim History / Subjective:   Some slow  improvement neuro status over last 2-3 days cEEG started 5/25 WBC 36, fever overnight > ID sent repeat resp and blood cx's as above Currently on low dose precedex    Objective   Blood pressure 103/69, pulse 78, temperature 99.8 F (37.7 C), temperature source Oral, resp. rate (!) 24, height 5' 10.98" (1.803 m), weight 111.8 kg, SpO2 95 %.    Vent Mode: PRVC FiO2 (%):  [40 %] 40 % Set Rate:  [16 bmp] 16 bmp Vt Set:  [600 mL] 600 mL PEEP:  [5 cmH20] 5 cmH20 Pressure Support:  [10 cmH20] 10 cmH20 Plateau Pressure:  [13 cmH20-15 cmH20] 15 cmH20   Intake/Output Summary (Last 24 hours) at 05/08/2021 0740 Last data filed at 05/08/2021 0500 Gross per 24 hour  Intake 1433.27 ml  Output 2652 ml  Net -1218.73 ml   Filed Weights   05/06/21 0500 05/07/21 0500 05/08/21 0413  Weight: 113.5 kg 113.8 kg 111.8 kg   General: Obese man, laying in bed.  He appears comfortable HEENT: ET tube in place, oropharynx clear, pupils equal Neuro: Opens eyes to voice, tracks, following commands.  Good respiratory effort CV: Regular, distant, no murmur PULM: Decreased at both bases, otherwise clear, no wheeze GI: Obese, nondistended, positive bowel sounds Extremities: No edema Skin: No rash  Labs/imaging that I havepersonally reviewed  (right click and "Reselect all SmartList Selections" daily)   Studies and labs reviewed 5/26  Resolved Hospital Problem list   Lactic acidosis Erythrocytosis Bradycardia  Assessment & Plan:   pneumococcal meningitis with encephalitis, encephalopathy Recurrent fever -Appreciate ID and neurology assistance.  Plan  to continue penicillin infusion as ordered -Respiratory and blood cultures performed on 5/25 given recurrent fever.  No plan to broaden antibiotics for now, follow culture data -Plan to wean Precedex further 5/26, assess for wake up, potential for spontaneous breathing trials -Seizure precautions  Severe Sepsis due to pneumococcal meningitis, onset at  admission 5/18 Associated Septic shock, on pressors 5/18 - 5/19 -Following off pressors  Acute hypoxic respiratory failure -on 8 cc/kg and tolerating -Tolerating pressure port ventilation as well.  Hopefully his mental status will allow [place] to move towards activation.  If progress is slow then we will consider tracheostomy which may progress his care, decrease sedation needs, etc.  Have discussed with his wife. -VAP prevention orders -PAD protocol for his sedation   Atrial fibrillation/flutter.  Suspect secondary to stress state from his underlying illness.  Has been in and out.  Back to atrial fibrillation with stimulation on 5/24, 5/25 -Continue schedule metoprolol, metoprolol IV as needed -Deferring anticoagulation given the potential risk for given risk for ICH with meningitis. -MRI/V was reassuring, no evidence venous sinus thrombosis.   Electrolyte imbalance Total body volume overload Hypernatremia -following BMP -diuresis as he can tolerate, started on 5/23 -start free water on 5/26   Thrombocytopenia, likely due to sepsis, improving Leukocytosis  -following CBC  History of hypertension -Continue amlodipine, metoprolol -Decrease losartan to 50 mg on 5/26, follow hemodynamics.   Best practice (right click and "Reselect all SmartList Selections" daily)  Diet:  Tube Feed ; TF  Pain/Anxiety/Delirium protocol (if indicated): Yes (RASS goal 0) VAP protocol (if indicated): Yes DVT prophylaxis: SCD  GI prophylaxis: PPI Glucose control:  SSI Yes Central venous access:  N/A Arterial line:  N/A Foley:  N/A;  Mobility:  bed rest  PT consulted: N/A Last date of multidisciplinary goals of care discussion [pending] Code Status:  full code Disposition: ICU  Wife updated at bedside on 5/26  Labs   CBC: Recent Labs  Lab 05/04/21 0122 05/05/21 0215 05/06/21 0427 05/07/21 0359 05/08/21 0534  WBC 16.4* 18.1* 18.2* 25.4* 36.1*  HGB 18.4* 19.2* 17.5* 18.3* 16.7  HCT 55.5*  59.1* 54.9* 56.7* 51.2  MCV 95.0 95.9 97.7 97.4 97.0  PLT 148* 184 184 197 174    Basic Metabolic Panel: Recent Labs  Lab 05/03/21 1742 05/04/21 0122 05/05/21 0215 05/06/21 0540 05/07/21 0359 05/07/21 2355  NA 145 143 144 145 132* 145  K 4.6 4.5 4.3 4.2 4.3 3.4*  CL 112* 109 109 110 97* 112*  CO2 27 28 28 30 27 23   GLUCOSE 194* 206* 201* 178* 213* 138*  BUN 28* 26* 30* 36* 41* 64*  CREATININE 0.66 0.78 0.79 0.81 0.80 1.16  CALCIUM 7.9* 7.9* 7.5* 7.3* 6.8* 6.8*  MG 2.5* 2.6*  --  2.4 2.2  --   PHOS  --  1.7* 2.4*  --  3.2  --    GFR: Estimated Creatinine Clearance: 85 mL/min (by C-G formula based on SCr of 1.16 mg/dL). Recent Labs  Lab 05/05/21 0215 05/06/21 0427 05/07/21 0359 05/08/21 0534  WBC 18.1* 18.2* 25.4* 36.1*    Liver Function Tests: Recent Labs  Lab 05/05/21 0215  AST 43*  ALT 133*  ALKPHOS 44  BILITOT 0.6  PROT 5.4*  ALBUMIN 2.1*   No results for input(s): LIPASE, AMYLASE in the last 168 hours. No results for input(s): AMMONIA in the last 168 hours.  ABG    Component Value Date/Time   PHART 7.374 05/01/2021 0436   PCO2ART 43.0  05/01/2021 0436   PO2ART 105 05/01/2021 0436   HCO3 25.1 05/01/2021 0436   TCO2 26 05/01/2021 0436   ACIDBASEDEF 2.0 04/29/2021 2358   O2SAT 98.0 05/01/2021 0436     Coagulation Profile: No results for input(s): INR, PROTIME in the last 168 hours.  Cardiac Enzymes: No results for input(s): CKTOTAL, CKMB, CKMBINDEX, TROPONINI in the last 168 hours.  HbA1C: Hgb A1c MFr Bld  Date/Time Value Ref Range Status  04/30/2021 07:39 AM 5.4 4.8 - 5.6 % Final    Comment:    (NOTE) Pre diabetes:          5.7%-6.4%  Diabetes:              >6.4%  Glycemic control for   <7.0% adults with diabetes     CBG: Recent Labs  Lab 05/07/21 1130 05/07/21 1431 05/07/21 1936 05/07/21 2320 05/08/21 0335  GLUCAP 193* 206* 226* 174* 189*    Independent CC time 32  minutes  Baltazar Apo, MD, PhD 05/08/2021, 7:40  AM [facility] [phone] or if no answer before 7:00PM call [phone] For any issues after 7:00PM please call eLink [phone]

## 2021-05-08 NOTE — Progress Notes (Signed)
Subjective: No significant changes  Exam: Vitals:   05/08/21 0700 05/08/21 0730  BP:    Pulse:    Resp:    Temp: 99.8 F (37.7 C)   SpO2:  95%   Gen: In bed, NAD Resp: non-labored breathing, no acute distress Abd: soft, nt  Neuro: MS: opens eyes spontaneously, fixates, tracks. Follows commands to squeeze hand and wiggle toes, but more reliably on the right than left.  JI:RCVEL pupil minimally bigger than left, but within range of normal,  Looks in both directions to command Motor: he squeezes hand bilaterally and wiglgles toes. Still with slightly less movemetn in the left leg, but he does move it some.  Sensory:responds to nox stim x 4.   Pertinent Labs: 5/18 -  CSF Glucose < 20 CSF protein > 600 CSF WBC 510 CSF RBC 110  Impression: 60 yo M with strep pneumo meningitis. MRI did not reveal any septic thrombosis. Overall his exam appears to be improving as would be expected with response to antibiotics. I would expect slow continued improvement in mental status. With some question of focality previously and no definite explanation I think overnight EEG to exclude recurrent seizures would be prudent. If this is negative, then I would not pursue it any further.   Recommendations: 1) f/u EEG 2) If negative then only supportive care from this point onwards.   Roland Rack, MD [facility] [phone]  If 7pm- 7am, please page neurology on call as listed in [place].

## 2021-05-08 NOTE — Progress Notes (Signed)
[facility] ADULT ICU REPLACEMENT PROTOCOL   The patient does apply for the [facility] Adult ICU Electrolyte Replacment Protocol based on the criteria listed below:   1. Is GFR >/= 30 ml/min? Yes.    Patient's GFR today is >60 2. Is SCr </= 2? Yes.   Patient's SCr is 1.16 ml/kg/hr 3. Did SCr increase >/= 0.5 in 24 hours? No. 4. Abnormal electrolyte(s): k 3.4 5. Ordered repletion with: protocol per tube 6. If a panic level lab has been reported, has the CCM MD in charge been notified? No..   Physician:    Ronda Fairly A 05/08/2021 3:00 AM

## 2021-05-08 NOTE — Progress Notes (Signed)
LTM EEG complete, no skin breakdown noted.

## 2021-05-08 NOTE — TOC Initial Note (Signed)
Transition of Care [facility]) - Initial/Assessment Note    Patient Details  Name: Hayden Mcbride MRN: [id number] Date of Birth: 1961/05/22  Transition of Care [facility]) CM/SW Contact:    Verdell Carmine, RN Phone Number: 05/08/2021, 9:21 AM  Clinical Narrative:                 Admitted for meningitis bacterial. In good health until  Sudden onset of headache AMS.  recent travel to [place]. Intubated and sedated currently. On IV antibiotics. Planned on overnight EEG. CM to follow for any needs.   Expected Discharge Plan: [place] Barriers to Discharge: Continued Medical Work up   Patient Goals and CMS Choice        Expected Discharge Plan and Services Expected Discharge Plan: [place]   Discharge Planning Services: CM Consult   Living arrangements for the past 2 months: Single Family Home                                      Prior Living Arrangements/Services Living arrangements for the past 2 months: Single Family Home Lives with:: Spouse Patient language and need for interpreter reviewed:: Yes        Need for Family Participation in Patient Care: Yes (Comment) Care giver support system in place?: Yes (comment)   Criminal Activity/Legal Involvement Pertinent to Current Situation/Hospitalization: No - Comment as needed  Activities of Daily Living Home Assistive Devices/Equipment: None ADL Screening (condition at time of admission) Patient's cognitive ability adequate to safely complete daily activities?: No Is the patient deaf or have difficulty hearing?: Yes Does the patient have difficulty seeing, even when wearing glasses/contacts?: No Does the patient have difficulty concentrating, remembering, or making decisions?: Yes Patient able to express need for assistance with ADLs?: No Does the patient have difficulty dressing or bathing?: Yes Independently performs ADLs?: No Communication: Dependent Is this a change from  baseline?: Change from baseline, expected to last <3 days Dressing (OT): Dependent Is this a change from baseline?: Change from baseline, expected to last <3days Grooming: Dependent Is this a change from baseline?: Change from baseline, expected to last <3 days Feeding: Dependent Is this a change from baseline?: Change from baseline, expected to last <3 days Bathing: Dependent Is this a change from baseline?: Change from baseline, expected to last <3 days Toileting: Dependent Is this a change from baseline?: Change from baseline, expected to last <3 days In/Out Bed: Dependent Is this a change from baseline?: Change from baseline, expected to last <3 days Walks in Home: Dependent Is this a change from baseline?: Change from baseline, expected to last <3 days Does the patient have difficulty walking or climbing stairs?: Yes Weakness of Legs: None Weakness of Arms/Hands: None  Permission Sought/Granted                  Emotional Assessment   Attitude/Demeanor/Rapport: Unable to Assess (intubed sedated) Affect (typically observed): Unable to Assess Orientation: : Fluctuating Orientation (Suspected and/or reported Sundowners) Alcohol / Substance Use: Not Applicable Psych Involvement: No (comment)  Admission diagnosis:  Altered mental status, unspecified altered mental status type [R41.82] AMS (altered mental status) [R41.82] Patient Active Problem List   Diagnosis Date Noted  . FUO (fever of unknown origin)   . Status post peripherally inserted central catheter (PICC) central line placement   . Pneumococcal meningitis   . Meningitis   .  Acute respiratory failure with hypoxemia ([place])   . AMS (altered mental status) 04/30/2021  . Leukocytosis   . Family history of colon cancer 04/24/2015  . Dyspnea 03/01/2015  . RBBB 03/01/2015  . Preoperative cardiovascular examination 03/01/2015  . Pain in the chest 03/01/2015  . Morbid obesity ([place]) 03/01/2015  . Hx of adenomatous  colonic polyps 07/08/2006   PCP:  Robyne Peers, MD Pharmacy:   [facility] [phone] - [place], [address] [place] [address] Phone: [phone] Fax: [phone]     Social Determinants of Health (SDOH) Interventions    Readmission Risk Interventions No flowsheet data found.

## 2021-05-08 NOTE — Progress Notes (Signed)
Subjective: On ventilator   Antibiotics:  Anti-infectives (From admission, onward)   Start     Dose/Rate Route Frequency Ordered Stop   05/09/21 0500  vancomycin (VANCOREADY) IVPB 1500 mg/300 mL        1,500 mg 150 mL/hr over 120 Minutes Intravenous Every 12 hours 05/08/21 1353     05/08/21 1500  vancomycin (VANCOREADY) IVPB 2000 mg/400 mL        2,000 mg 200 mL/hr over 120 Minutes Intravenous  Once 05/08/21 1353     05/03/21 1215  penicillin G potassium 12 Million Units in dextrose 5 % 500 mL continuous infusion        12 Million Units 41.7 mL/hr over 12 Hours Intravenous Every 12 hours 05/03/21 1123     05/01/21 2200  vancomycin (VANCOREADY) IVPB 1000 mg/200 mL  Status:  Discontinued        1,000 mg 200 mL/hr over 60 Minutes Intravenous Every 8 hours 05/01/21 1312 05/02/21 0902   05/01/21 1400  vancomycin (VANCOREADY) IVPB 1000 mg/200 mL        1,000 mg 200 mL/hr over 60 Minutes Intravenous NOW 05/01/21 1312 05/01/21 1523   04/30/21 1200  vancomycin (VANCOREADY) IVPB 1000 mg/200 mL  Status:  Discontinued        1,000 mg 200 mL/hr over 60 Minutes Intravenous Every 12 hours 04/29/21 2334 05/01/21 1312   04/30/21 0600  acyclovir (ZOVIRAX) 1,000 mg in dextrose 5 % 150 mL IVPB  Status:  Discontinued        1,000 mg 170 mL/hr over 60 Minutes Intravenous Every 8 hours 04/30/21 0243 04/30/21 1119   04/30/21 0000  ampicillin (OMNIPEN) 2 g in sodium chloride 0.9 % 100 mL IVPB  Status:  Discontinued        2 g 300 mL/hr over 20 Minutes Intravenous Every 4 hours 04/29/21 2330 05/01/21 0958   04/29/21 2345  cefTRIAXone (ROCEPHIN) 2 g in sodium chloride 0.9 % 100 mL IVPB  Status:  Discontinued        2 g 200 mL/hr over 30 Minutes Intravenous Every 12 hours 04/29/21 2330 05/03/21 1123   04/29/21 2345  vancomycin (VANCOREADY) IVPB 2000 mg/400 mL        2,000 mg 200 mL/hr over 120 Minutes Intravenous  Once 04/29/21 2334 04/30/21 0240      Medications: Scheduled Meds: .  amLODipine  5 mg Per Tube Daily  . chlorhexidine gluconate (MEDLINE KIT)  15 mL Mouth Rinse BID  . Chlorhexidine Gluconate Cloth  6 each Topical Daily  . docusate  100 mg Per Tube BID  . feeding supplement (PROSource TF)  45 mL Per Tube TID  . fentaNYL (SUBLIMAZE) injection  50 mcg Intravenous Once  . free water  200 mL Per Tube Q8H  . furosemide  40 mg Intravenous BID  . heparin injection (subcutaneous)  5,000 Units Subcutaneous Q8H  . insulin aspart  0-20 Units Subcutaneous Q4H  . [START ON 05/09/2021] losartan  50 mg Per Tube Daily  . mouth rinse  15 mL Mouth Rinse 10 times per day  . metoprolol tartrate  75 mg Per Tube BID  . pantoprazole sodium  40 mg Per Tube Daily  . polyethylene glycol  17 g Per Tube Daily  . sodium chloride flush  10-40 mL Intracatheter Q12H   Continuous Infusions: . sodium chloride Stopped (05/05/21 0949)  . dexmedetomidine (PRECEDEX) IV infusion 0.4 mcg/kg/hr (05/08/21 1100)  . feeding supplement (VITAL AF 1.2  CAL) 1,000 mL (05/06/21 1957)  . fentaNYL infusion INTRAVENOUS Stopped (05/08/21 0206)  . penicillin g continuous IV infusion 41.7 mL/hr at 05/08/21 1319  . [START ON 05/09/2021] vancomycin    . vancomycin     PRN Meds:.sodium chloride, acetaminophen (TYLENOL) oral liquid 160 mg/5 mL, bisacodyl, fentaNYL, LORazepam, metoprolol tartrate, sodium chloride flush    Objective: Weight change: -2 kg  Intake/Output Summary (Last 24 hours) at 05/08/2021 1357 Last data filed at 05/08/2021 1100 Gross per 24 hour  Intake 1451.04 ml  Output 1927 ml  Net -475.96 ml   Blood pressure (!) 81/47, pulse 62, temperature 99.9 F (37.7 C), temperature source Axillary, resp. rate (!) 28, height 5' 10.98" (1.803 m), weight 111.8 kg, SpO2 96 %. Temp:  [98.9 F (37.2 C)-101.5 F (38.6 C)] 99.9 F (37.7 C) (05/26 1100) Pulse Rate:  [56-90] 62 (05/26 1100) Resp:  [16-33] 28 (05/26 1100) BP: (74-154)/(45-98) 81/47 (05/26 1100) SpO2:  [94 %-97 %] 96 % (05/26  1152) FiO2 (%):  [40 %] 40 % (05/26 1152) Weight:  [111.8 kg] 111.8 kg (05/26 0413)  Physical Exam: Physical Exam Constitutional:      Appearance: He is well-developed. He is ill-appearing.     Interventions: He is intubated.  HENT:     Head: Normocephalic and atraumatic.  Eyes:     General:        Right eye: No discharge.        Left eye: No discharge.     Extraocular Movements: Extraocular movements intact.     Conjunctiva/sclera: Conjunctivae normal.  Cardiovascular:     Rate and Rhythm: Regular rhythm. Tachycardia present.     Heart sounds: No murmur heard. No gallop.   Pulmonary:     Effort: Pulmonary effort is normal. No respiratory distress. He is intubated.     Breath sounds: Rhonchi present. No wheezing.  Abdominal:     General: There is no distension.     Palpations: Abdomen is soft. There is no mass.  Musculoskeletal:        General: Normal range of motion.     Cervical back: Normal range of motion and neck supple.  Skin:    General: Skin is warm and dry.     Findings: No erythema or rash.  Neurological:     Mental Status: He is alert.     Comments: He is following commands and tracking with his eyes     Lines do not appear overtly infected CBC:    BMET Recent Labs    05/08/21 0830 05/08/21 1000  NA 138 147*  K 6.7* 4.1  CL 104 112*  CO2 27 27  GLUCOSE 378* 171*  BUN 62* 65*  CREATININE 1.13 0.96  CALCIUM 7.1* 7.6*     Liver Panel  No results for input(s): PROT, ALBUMIN, AST, ALT, ALKPHOS, BILITOT, BILIDIR, IBILI in the last 72 hours.     Sedimentation Rate No results for input(s): ESRSEDRATE in the last 72 hours. C-Reactive Protein No results for input(s): CRP in the last 72 hours.  Micro Results: Recent Results (from the past 720 hour(s))  SARS CORONAVIRUS 2 (TAT 6-24 HRS) Nasopharyngeal Nasopharyngeal Swab     Status: None   Collection Time: 04/29/21 10:57 PM   Specimen: Nasopharyngeal Swab  Result Value Ref Range Status    SARS Coronavirus 2 NEGATIVE NEGATIVE Final    Comment: (NOTE) SARS-CoV-2 target nucleic acids are NOT DETECTED.  The SARS-CoV-2 RNA is generally detectable in upper and lower  respiratory specimens during the acute phase of infection. Negative results do not preclude SARS-CoV-2 infection, do not rule out co-infections with other pathogens, and should not be used as the sole basis for treatment or other patient management decisions. Negative results must be combined with clinical observations, patient history, and epidemiological information. The expected result is Negative.  Fact Sheet for Patients: SugarRoll.be  Fact Sheet for Healthcare Providers: https://www.woods-mathews.com/  This test is not yet approved or cleared by the [place] FDA and  has been authorized for detection and/or diagnosis of SARS-CoV-2 by FDA under an Emergency Use Authorization (EUA). This EUA will remain  in effect (meaning this test can be used) for the duration of the COVID-19 declaration under Se ction 564(b)(1) of the Act, 21 U.S.C. section 360bbb-3(b)(1), unless the authorization is terminated or revoked sooner.  Performed at [facility], [place] [address] [place], [place] [address]   CSF culture     Status: None   Collection Time: 04/30/21  2:57 AM   Specimen: CSF; Cerebrospinal Fluid  Result Value Ref Range Status   Specimen Description CSF  Final   Special Requests Normal  Final   Gram Stain   Final    GRAM POSITIVE COCCI WBC PRESENT, PREDOMINANTLY MONONUCLEAR CRITICAL RESULT CALLED TO, READ BACK BY AND VERIFIED WITH: RN MEGAN PLUMMER BY MESSAN H. AT 0345 ON 5 18 2022 CYTOSPIN SMEAR Performed at [facility], [place] [address], [place], [place] [address]    Culture FEW STREPTOCOCCUS PNEUMONIAE  Final   Report Status 05/02/2021 FINAL  Final   Organism ID, Bacteria STREPTOCOCCUS PNEUMONIAE  Final      Susceptibility   Streptococcus  pneumoniae - MIC*    ERYTHROMYCIN <=0.12 SENSITIVE Sensitive     LEVOFLOXACIN 0.5 SENSITIVE Sensitive     VANCOMYCIN 0.5 SENSITIVE Sensitive     PENICILLIN (meningitis) <=0.06 SENSITIVE Sensitive     PENO - penicillin <=0.06      PENICILLIN (non-meningitis) <=0.06 SENSITIVE Sensitive     PENICILLIN (oral) <=0.06 SENSITIVE Sensitive     CEFTRIAXONE (non-meningitis) <=0.12 SENSITIVE Sensitive     CEFTRIAXONE (meningitis) <=0.12 SENSITIVE Sensitive     * FEW STREPTOCOCCUS PNEUMONIAE  Culture, fungus without smear     Status: None (Preliminary result)   Collection Time: 04/30/21  2:57 AM   Specimen: CSF; Cerebrospinal Fluid  Result Value Ref Range Status   Specimen Description CSF  Final   Special Requests NONE  Final   Culture   Final    NO FUNGUS ISOLATED AFTER 8 DAYS Performed at [facility], [address], [place], [place] [address]    Report Status PENDING  Incomplete  MRSA PCR Screening     Status: Abnormal   Collection Time: 04/30/21  5:17 AM   Specimen: Nasal Mucosa; Nasopharyngeal  Result Value Ref Range Status   MRSA by PCR POSITIVE (A) NEGATIVE Final    Comment:        The GeneXpert MRSA Assay (FDA approved for NASAL specimens only), is one component of a comprehensive MRSA colonization surveillance program. It is not intended to diagnose MRSA infection nor to guide or monitor treatment for MRSA infections. RESULT CALLED TO, READ BACK BY AND VERIFIED WITH: PLUMBER,M RN 04/30/2021 AT 1448 SKEEN,P Performed at [facility], [place] [address] [place], [place] [address]   HSV 1/2 Ab IgG/IgM CSF     Status: None   Collection Time: 04/30/21  5:27 AM   Specimen:  Cerebrospinal Fluid  Result Value Ref Range Status   HSV 1/2 Ab, IgM, CSF 0.66 <=0.89 IV Final    Comment: (NOTE) INTERPRETIVE INFORMATION: Herpes Simplex Virus                          Type 1 and/or 2 Antibodies,                          IgM by ELISA, CSF  0.89 IV or Less .......... Negative: No  significant                             level of detectable HSV IgM                             antibody.  0.90 - 1.09 IV ........... Equivocal: Questionable                             presence of IgM antibodies.                             Repeat testing in 10-14 days                             may be helpful.  1.10 IV or Greater ....... Positive: IgM antibody to HSV                             detected, which may indicate a                             current or recent infection.                             However, low levels of IgM                             antibodies may occasionally                             persist for more than 12                             months post-infection. The detection of antibodies to herpes simplex virus in CSF may indicate central nervou s system infection. However, consideration must be given to possible contamination by blood or transfer of serum antibodies across the blood-brain barrier. Fourfold or greater rise in CSF antibodies to herpes on specimens at least 4 weeks apart are found in 74-94 % of patients with herpes encephalitis. Specificity of the test based on a single CSF testing is not established. Presently PCR is the primary means of establishing a diagnosis of herpes encephalitis. This test was developed and its performance characteristics determined by [employer]. It has not been cleared or approved by the [place] Food and Drug Administration. This test was performed in a CLIA certified laboratory and is intended for clinical purposes.    HSV  1/2 Ab Screen IgG, CSF <0.34 <=0.89 IV Final    Comment: (NOTE) INTERPRETIVE INFORMATION: Herpes Simplex Virus Type 1 and/or 2                    Antibodies, IgG CSF  0.89 IV or Less .......... Negative: No significant                             level of detectable HSV IgG                             antibody.  0.90 - 1.09 IV ........... Equivocal: Questionable                              presence of IgG antibodies.                             Repeat testing in 10-14 days                             may be helpful.  1.10 IV or Greater ....... Positive: IgG antibody to HSV                             detected, which may indicate                             a current or past HSV                             infection. The detection of antibodies to herpes simplex virus in CSF may indicate central nervous system infection. However, consideration must be given to possible contamination by blood or transfer of serum antibodies across the blood-brain barrier. Fourfold or greater rise in CSF antibodies to herpes on specimens at l east 4 weeks apart are found in 74-94 % of patients with herpes encephalitis. Specificity of the test based on a single CSF testing is not established. Presently PCR is the primary means of establishing a diagnosis of herpes encephalitis. This test was developed and its performance characteristics determined by [employer]. It has not been cleared or approved by the [place] Food and Drug Administration. This test was performed in a CLIA certified laboratory and is intended for clinical purposes. Performed At: [facility] [address] [place], [place] [id number] Hilton Sinclair I MD [phone]   Culture, blood (Routine X 2) w Reflex to ID Panel     Status: None   Collection Time: 04/30/21 11:30 AM   Specimen: BLOOD  Result Value Ref Range Status   Specimen Description BLOOD LEFT ANTECUBITAL  Final   Special Requests   Final    BOTTLES DRAWN AEROBIC AND ANAEROBIC Blood Culture adequate volume   Culture   Final    NO GROWTH 5 DAYS Performed at [facility], [place] [address] [place], [place] [address]    Report Status 05/05/2021 FINAL  Final  Culture, blood (Routine X 2) w Reflex to ID Panel     Status: None   Collection Time: 04/30/21 11:47 AM   Specimen: BLOOD  LEFT FOREARM  Result Value Ref Range Status   Specimen  Description BLOOD LEFT FOREARM  Final   Special Requests   Final    BOTTLES DRAWN AEROBIC ONLY Blood Culture results may not be optimal due to an inadequate volume of blood received in culture bottles   Culture   Final    NO GROWTH 5 DAYS Performed at [facility], [place] [address], [place], [place] [address]    Report Status 05/05/2021 FINAL  Final  Culture, Respiratory w Gram Stain     Status: None (Preliminary result)   Collection Time: 05/07/21 11:58 AM   Specimen: Tracheal Aspirate; Respiratory  Result Value Ref Range Status   Specimen Description TRACHEAL ASPIRATE  Final   Special Requests Immunocompromised  Final   Gram Stain   Final    ABUNDANT WBC PRESENT, PREDOMINANTLY PMN ABUNDANT GRAM POSITIVE COCCI RARE YEAST    Culture   Final    MODERATE STAPHYLOCOCCUS AUREUS SUSCEPTIBILITIES TO FOLLOW Performed at [facility], [address] [place], [place] [address]    Report Status PENDING  Incomplete  Culture, blood (Routine X 2) w Reflex to ID Panel     Status: None (Preliminary result)   Collection Time: 05/07/21  3:50 PM   Specimen: BLOOD  Result Value Ref Range Status   Specimen Description BLOOD LEFT ANTECUBITAL  Final   Special Requests   Final    BOTTLES DRAWN AEROBIC AND ANAEROBIC Blood Culture results may not be optimal due to an inadequate volume of blood received in culture bottles   Culture   Final    NO GROWTH < 24 HOURS Performed at [facility], [place] [address], [place], [place] [address]    Report Status PENDING  Incomplete  Culture, blood (Routine X 2) w Reflex to ID Panel     Status: None (Preliminary result)   Collection Time: 05/07/21  3:57 PM   Specimen: BLOOD  Result Value Ref Range Status   Specimen Description BLOOD LEFT ANTECUBITAL  Final   Special Requests   Final    BOTTLES DRAWN AEROBIC AND ANAEROBIC Blood Culture adequate volume   Culture   Final    NO GROWTH < 24 HOURS Performed at [facility], [place] [address], [place], [place] [address]    Report Status PENDING  Incomplete    Studies/Results: MR BRAIN W WO CONTRAST  Result Date: 05/06/2021 CLINICAL DATA:  Follow-up dural venous thrombosis. Bacterial meningitis. EXAM: MRI HEAD WITHOUT AND WITH CONTRAST TECHNIQUE: Multiplanar, multiecho pulse sequences of the brain and surrounding structures were obtained without and with intravenous contrast. CONTRAST:  23mL GADAVIST GADOBUTROL 1 MMOL/ML IV SOLN COMPARISON:  Head CT yesterday.  MRI 04/30/2021 FINDINGS: Brain: There is a constellation of findings consistent with bacterial meningitis. Diffusion imaging does not show any restricted diffusion affecting the brain parenchyma to indicate infarction. Numerous septations/synechiae are noted within the subarachnoid spaces, and there scattered discrete foci of restricted diffusion within the subarachnoid spaces consistent with bacterial meningitis. These appear progressive compared to the study of 6 days ago. No evidence of ventriculitis of the lateral or third ventricles. Some restricted diffusion material present in the fourth ventricle. No evidence change in ventricular size. Small focus of hemosiderin deposition in the left temporoparietal junction region is unchanged, possibly pre-existing the current infection. After contrast administration, there is a remarkable absence of abnormal contrast enhancement of the leptomeninges. Vascular: Major vessels at the base of the brain show  flow. Postcontrast imaging does not show evidence of venous thrombosis. Skull and upper cervical spine: No primary bone pathology. Sinuses/Orbits: Paranasal sinuses are clear. Fluid in left mastoid region and middle ear, similar to the previous exam. Other: None IMPRESSION: Findings consistent with the clinical diagnosis of bacterial meningitis. Since the previous study, the patient has developed more numerous foci of restricted diffusion scattered about the subarachnoid spaces, with areas of  septation and synechiae formation consistent with ongoing bacterial meningitis. Small amount of restricted diffusion material present in the fourth ventricle, but no evidence of widespread ventriculitis. No sign of venous thrombosis. No sign of brain parenchymal infarction. No change in ventricular size. Remarkable absence of abnormal leptomeningeal contrast enhancement. Electronically Signed   By: Nelson Chimes M.D.   On: 05/06/2021 17:23   MR Venogram Head  Result Date: 05/06/2021 CLINICAL DATA:  Bacterial meningitis.  Assess for venous thrombosis. EXAM: MR VENOGRAM HEAD WITHOUT AND WITH CONTRAST TECHNIQUE: Angiographic images of the intracranial venous structures were acquired using MRV technique without intravenous contrast. COMPARISON:  MRI brain same day.  Head CT yesterday. FINDINGS: No evidence intracranial venous thrombosis. Dominant right transverse sinus and jugular system, with a diminutive left transverse sinus, sigmoid sinus and jugular vein, but without evidence of thrombosis, particularly when correlated with the postcontrast images from the brain MRI same day. IMPRESSION: No intracranial venous thrombosis. The patient shows dominance of the right transverse sinus and jugular system. The left transverse sinus is diminutive, but does not show evidence of thrombosis, particularly given the postcontrast imaging. Electronically Signed   By: Nelson Chimes M.D.   On: 05/06/2021 17:27   DG Chest Port 1 View  Result Date: 05/07/2021 CLINICAL DATA:  Acute respiratory failure.  Hypoxia EXAM: PORTABLE CHEST 1 VIEW COMPARISON:  Chest x-ray 05/06/2021 FINDINGS: Enteric tube with tip terminating 6.3 cm above the carina. Enteric tube coursing below the hemidiaphragm with tip overlying the gastric lumen and side port not definitely identified. Right PICC not well visualized. The heart size and mediastinal contours are within normal limits. Right lower lung zone opacity. Left base nodular density. No pulmonary  edema. No pleural effusion. No pneumothorax. No acute osseous abnormality. IMPRESSION: 1. Bilateral lower lobe nodular-like opacities. 2. Enteric tube with tip terminating 6.3 cm above the carina. 3. Enteric tube coursing below the hemidiaphragm with tip overlying the gastric lumen and side port not definitely identified. 4. Right PICC not well visualized. Electronically Signed   By: Iven Finn M.D.   On: 05/07/2021 04:31   Overnight EEG with video  Result Date: 05/08/2021 Lora Havens, MD     05/08/2021  9:12 AM Patient Name: Hayden Mcbride MRN: [id number] Epilepsy Attending: Lora Havens Referring Physician/Provider: Dr Kathrynn Speed Duration:  05/07/2021 1532 to 05/09/2019 0900  Patient history: 60 year old with strep pneumo meningitis. EEG to evaluate for seizure.  Level of alertness: awake, asleep  AEDs during EEG study: None  Technical aspects: This EEG study was done with scalp electrodes positioned according to the 10-20 International system of electrode placement. Electrical activity was acquired at a sampling rate of $Remov'500Hz'YwGVnU$  and reviewed with a high frequency filter of $RemoveB'70Hz'zdnkObkl$  and a low frequency filter of $RemoveB'1Hz'sNcSIprb$ . EEG data were recorded continuously and digitally stored.  Description: No clear posterior dominant rhythm was seen. EEG showed continuous generalized polymorphic mixed frequencies with predominantly 5 to 9 Hz theta and alpha activity as well as intermittent generalized 2 to 3 Hz delta slowing, at times with triphasic morphology.  Hyperventilation and photic stimulation were not performed.    ABNORMALITY - Continuous slow, generalized  IMPRESSION: This study is suggestive of moderate diffuse encephalopathy, non specific etiology. No seizures or definite epileptiform discharges were seen throughout the recording.  Priyanka [name]      Assessment/Plan:  INTERVAL HISTORY:  Neurologically is making progress  Principal Problem:   Pneumococcal meningitis Active  Problems:   AMS (altered mental status)   Meningitis   Acute respiratory failure with hypoxemia (HCC)   Status post peripherally inserted central catheter (PICC) central line placement   FUO (fever of unknown origin)    Hayden Mcbride is a 60 y.o. male with severe pneumococcal meningitis  #`1 Pneumococcal meningitis:   Continue high-dose penicillin  MRV was reassuring.  #2 Fevers: He is growing some Staph aureus from the lungs we will add vancomycin  I spent greater than 35 minutes with the patient including greater than 50% of time in face to face counsel of the patient reviewing his radiographs and in coordination of his care     LOS: 8 days   Alcide Evener 05/08/2021, 1:57 PM

## 2021-05-08 NOTE — Procedures (Addendum)
Patient Name: TAYSHON WINKER  MRN: [id number]  Epilepsy Attending: Lora Havens  Referring Physician/Provider: Dr Kathrynn Speed Duration:  05/07/2021 1532 to 05/09/2019 1009  Patient history: 60 year old with strep pneumo meningitis. EEG to evaluate for seizure.  Level of alertness: awake, asleep  AEDs during EEG study: None  Technical aspects: This EEG study was done with scalp electrodes positioned according to the 10-20 International system of electrode placement. Electrical activity was acquired at a sampling rate of 500Hz  and reviewed with a high frequency filter of 70Hz  and a low frequency filter of 1Hz . EEG data were recorded continuously and digitally stored.   Description: No clear posterior dominant rhythm was seen. EEG showed continuous generalized polymorphic mixed frequencies with predominantly 5 to 9 Hz theta and alpha activity as well as intermittent generalized 2 to 3 Hz delta slowing, at times with triphasic morphology.  Hyperventilation and photic stimulation were not performed.     ABNORMALITY - Continuous slow, generalized  IMPRESSION: This study is suggestive of moderate diffuse encephalopathy, non specific etiology. No seizures or definite epileptiform discharges were seen throughout the recording.  Jaira Canady [name]

## 2021-05-08 NOTE — Progress Notes (Signed)
EEG was negative. At this point, care will consist of supportive care and antibiotics. I think he has a good chance for recovery even if his progress is slow. Neurology will be available on an as needed basis moving forward.   Roland Rack, MD [facility] [phone]  If 7pm- 7am, please page neurology on call as listed in [place].

## 2021-05-09 ENCOUNTER — Inpatient Hospital Stay (HOSPITAL_COMMUNITY): Payer: Commercial Managed Care - PPO

## 2021-05-09 DIAGNOSIS — R4182 Altered mental status, unspecified: Secondary | ICD-10-CM | POA: Diagnosis not present

## 2021-05-09 DIAGNOSIS — Z4659 Encounter for fitting and adjustment of other gastrointestinal appliance and device: Secondary | ICD-10-CM | POA: Diagnosis not present

## 2021-05-09 DIAGNOSIS — G001 Pneumococcal meningitis: Secondary | ICD-10-CM | POA: Diagnosis not present

## 2021-05-09 DIAGNOSIS — J9601 Acute respiratory failure with hypoxia: Secondary | ICD-10-CM | POA: Diagnosis not present

## 2021-05-09 DIAGNOSIS — R509 Fever, unspecified: Secondary | ICD-10-CM | POA: Diagnosis not present

## 2021-05-09 LAB — BLOOD GAS, ARTERIAL
Acid-Base Excess: 3.1 mmol/L — ABNORMAL HIGH (ref 0.0–2.0)
Bicarbonate: 25.8 mmol/L (ref 20.0–28.0)
FIO2: 40
O2 Saturation: 99.1 %
Patient temperature: 36.9
pCO2 arterial: 30.3 mmHg — ABNORMAL LOW (ref 32.0–48.0)
pH, Arterial: 7.539 — ABNORMAL HIGH (ref 7.350–7.450)
pO2, Arterial: 190 mmHg — ABNORMAL HIGH (ref 83.0–108.0)

## 2021-05-09 LAB — CBC
HCT: 49.4 % (ref 39.0–52.0)
Hemoglobin: 16 g/dL (ref 13.0–17.0)
MCH: 31.8 pg (ref 26.0–34.0)
MCHC: 32.4 g/dL (ref 30.0–36.0)
MCV: 98.2 fL (ref 80.0–100.0)
Platelets: 188 10*3/uL (ref 150–400)
RBC: 5.03 MIL/uL (ref 4.22–5.81)
RDW: 14.1 % (ref 11.5–15.5)
WBC: 39.7 10*3/uL — ABNORMAL HIGH (ref 4.0–10.5)
nRBC: 0 % (ref 0.0–0.2)

## 2021-05-09 LAB — BASIC METABOLIC PANEL
Anion gap: 6 (ref 5–15)
BUN: 90 mg/dL — ABNORMAL HIGH (ref 8–23)
CO2: 27 mmol/L (ref 22–32)
Calcium: 7.6 mg/dL — ABNORMAL LOW (ref 8.9–10.3)
Chloride: 113 mmol/L — ABNORMAL HIGH (ref 98–111)
Creatinine, Ser: 1.57 mg/dL — ABNORMAL HIGH (ref 0.61–1.24)
GFR, Estimated: 50 mL/min — ABNORMAL LOW (ref >60)
Glucose, Bld: 157 mg/dL — ABNORMAL HIGH (ref 70–99)
Potassium: 3.9 mmol/L (ref 3.5–5.1)
Sodium: 146 mmol/L — ABNORMAL HIGH (ref 135–145)

## 2021-05-09 LAB — GLUCOSE, CAPILLARY
Glucose-Capillary: 139 mg/dL — ABNORMAL HIGH (ref 70–99)
Glucose-Capillary: 140 mg/dL — ABNORMAL HIGH (ref 70–99)
Glucose-Capillary: 144 mg/dL — ABNORMAL HIGH (ref 70–99)
Glucose-Capillary: 161 mg/dL — ABNORMAL HIGH (ref 70–99)
Glucose-Capillary: 165 mg/dL — ABNORMAL HIGH (ref 70–99)
Glucose-Capillary: 185 mg/dL — ABNORMAL HIGH (ref 70–99)

## 2021-05-09 LAB — ECHOCARDIOGRAM COMPLETE
Area-P 1/2: 2.58 cm2
Height: 70.984 in
S' Lateral: 3.5 cm
Weight: 3954.17 oz

## 2021-05-09 LAB — CULTURE, RESPIRATORY W GRAM STAIN

## 2021-05-09 LAB — MAGNESIUM: Magnesium: 2.8 mg/dL — ABNORMAL HIGH (ref 1.7–2.4)

## 2021-05-09 IMAGING — DX DG CHEST 1V PORT
1 series · 1 of 1 positions shown · non-contrast
Comparison: [DATE]

CLINICAL DATA: Acute respiratory failure, aspiration

EXAM:
PORTABLE CHEST 1 VIEW

[chest ap]
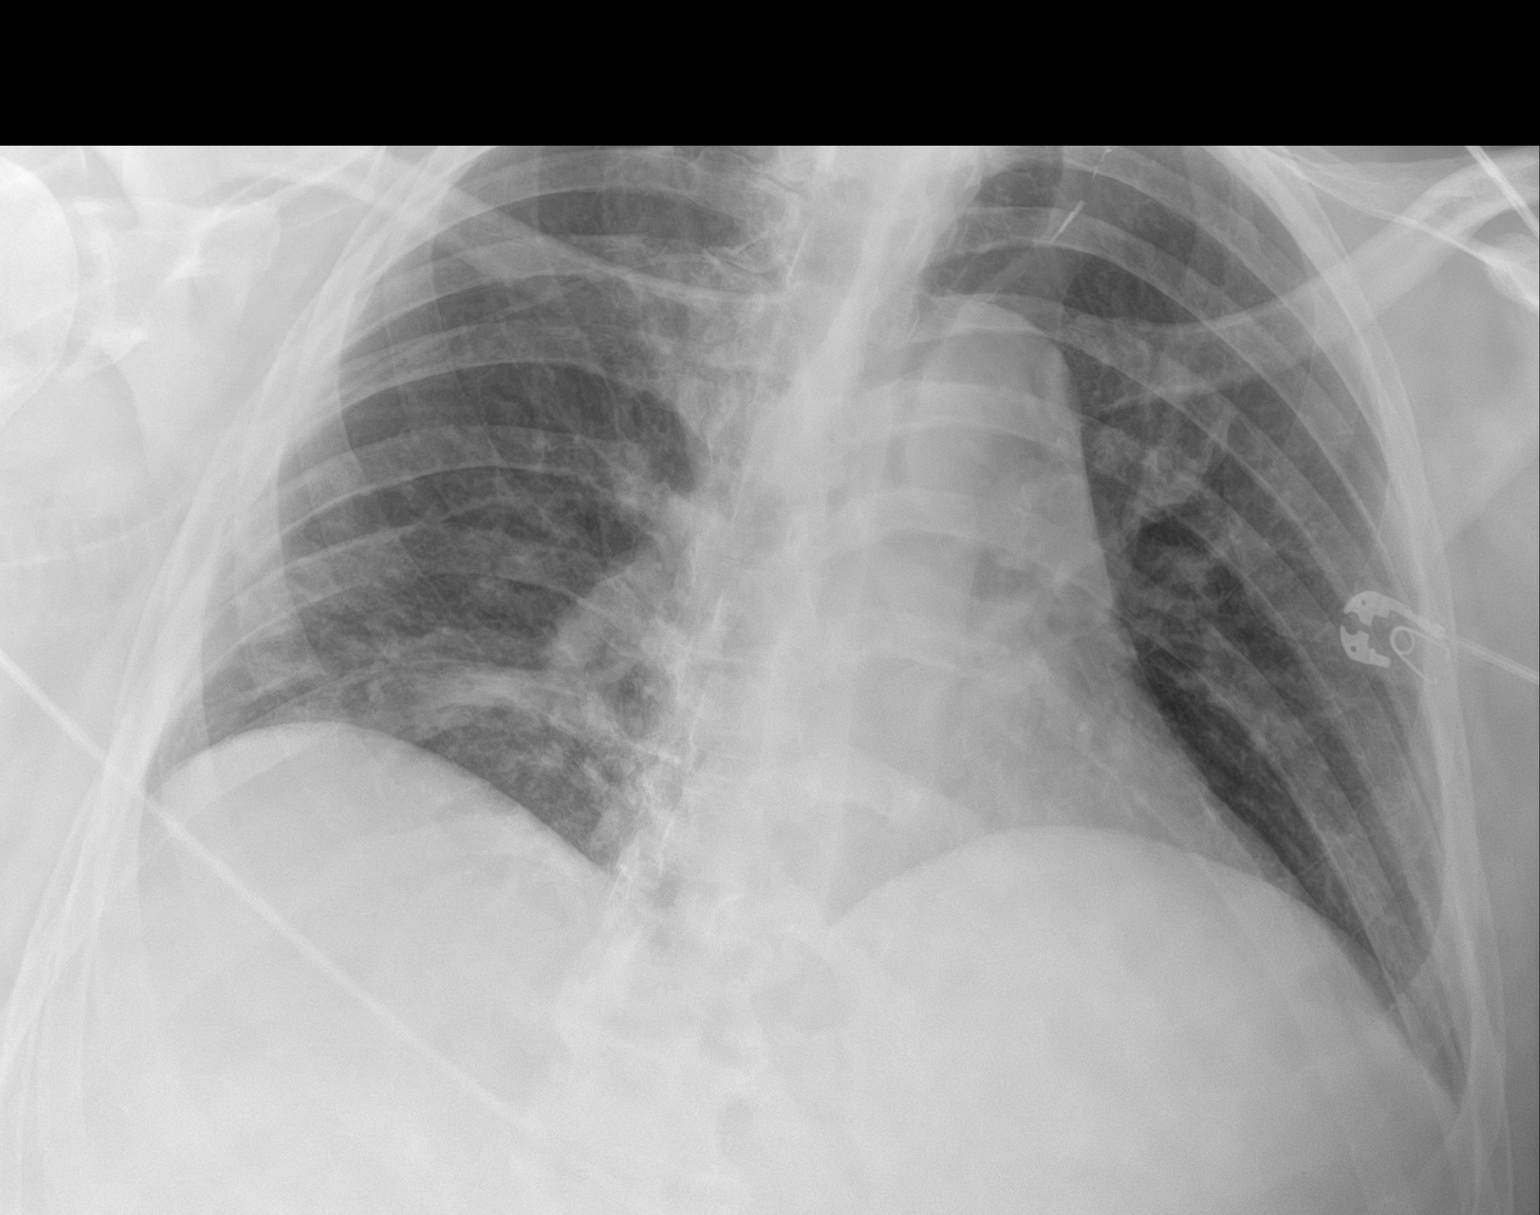

[1 of 1 positions shown; findings below may reference images not displayed]

FINDINGS: Single frontal view of the chest was obtained with the patient
rotated toward the left. Endotracheal and enteric catheter seen
previously have been removed. Cardiac silhouette is stable. Interval
development of patchy right basilar consolidation which could
reflect aspiration given clinical history. No effusion or
pneumothorax. No acute bony abnormalities.
IMPRESSION: 1. Interval development of patchy right basilar consolidation, which
could reflect aspiration given clinical history.

## 2021-05-09 IMAGING — DX DG ABDOMEN 1V
1 series · 1 of 1 positions shown · non-contrast
Comparison: Abdominal radiograph dated [DATE].

CLINICAL DATA: 60-year-old male with NG placement.

EXAM:
ABDOMEN - 1 VIEW

[abdomen kub]
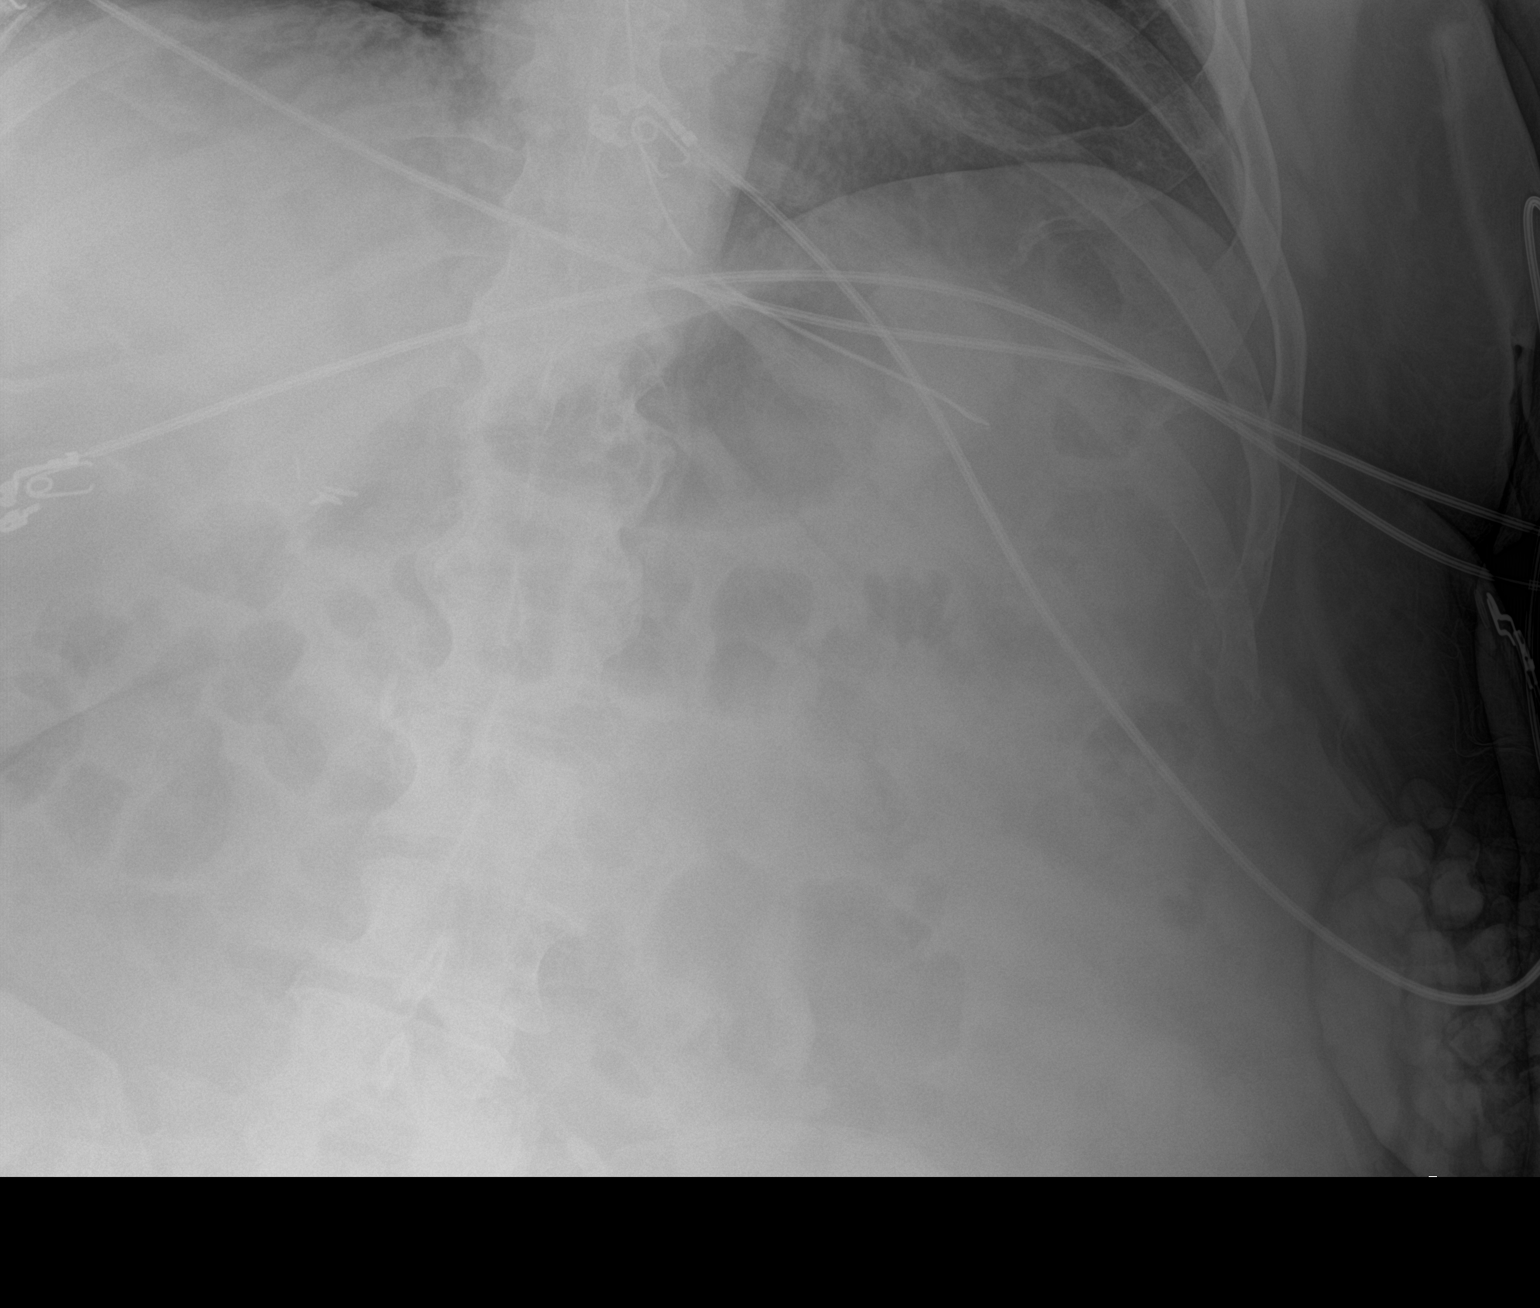

[1 of 1 positions shown; findings below may reference images not displayed]

FINDINGS: Enteric tube with tip in the proximal stomach and side-port in the
region of the GE junction. Recommend further advancing of the tube
by additional 10 cm. No bowel dilatation noted. Right upper quadrant
cholecystectomy clips.
IMPRESSION: Enteric tube with tip in the proximal stomach. Recommend further
advancing of the tube by additional 10 cm.

## 2021-05-09 MED ORDER — METOPROLOL TARTRATE 5 MG/5ML IV SOLN
2.5000 mg | Freq: Four times a day (QID) | INTRAVENOUS | Status: DC | PRN
  Administered 2021-05-10: 5 mg via INTRAVENOUS
  Filled 2021-05-09: qty 5

## 2021-05-09 MED ORDER — AMIODARONE HCL IN DEXTROSE 360-4.14 MG/200ML-% IV SOLN
30.0000 mg/h | INTRAVENOUS | Status: DC
  Administered 2021-05-10 – 2021-05-11 (×4): 30 mg/h via INTRAVENOUS
  Filled 2021-05-09 (×4): qty 200

## 2021-05-09 MED ORDER — DOXAZOSIN MESYLATE 2 MG PO TABS
2.0000 mg | ORAL_TABLET | Freq: Every day | ORAL | Status: DC
  Administered 2021-05-09: 2 mg
  Filled 2021-05-09 (×2): qty 1

## 2021-05-09 MED ORDER — AMIODARONE HCL IN DEXTROSE 360-4.14 MG/200ML-% IV SOLN
60.0000 mg/h | INTRAVENOUS | Status: AC
  Administered 2021-05-09 (×2): 60 mg/h via INTRAVENOUS
  Filled 2021-05-09: qty 200

## 2021-05-09 MED ORDER — FENTANYL CITRATE (PF) 100 MCG/2ML IJ SOLN
25.0000 ug | INTRAMUSCULAR | Status: DC | PRN
  Administered 2021-05-09: 50 ug via INTRAVENOUS
  Administered 2021-05-10: 100 ug via INTRAVENOUS
  Filled 2021-05-09: qty 2

## 2021-05-09 MED ORDER — FREE WATER: 200.0000 mL | Freq: Four times a day (QID) | Status: DC

## 2021-05-09 MED ORDER — AMIODARONE LOAD VIA INFUSION
150.0000 mg | Freq: Once | INTRAVENOUS | Status: AC
  Administered 2021-05-09: 150 mg via INTRAVENOUS
  Filled 2021-05-09: qty 83.34

## 2021-05-09 MED ORDER — LINEZOLID 600 MG/300ML IV SOLN
600.0000 mg | Freq: Two times a day (BID) | INTRAVENOUS | Status: AC
  Administered 2021-05-10 – 2021-05-16 (×14): 600 mg via INTRAVENOUS
  Filled 2021-05-09 (×15): qty 300

## 2021-05-09 NOTE — Progress Notes (Addendum)
NAME:  Hayden Mcbride, MRN:  [id number], DOB:  20-Apr-1961, LOS: 9 ADMISSION DATE:  04/29/2021, CONSULTATION DATE:  05/09/21 REFERRING MD:  EDP, CHIEF COMPLAINT:  AMS   Brief summary:  60 y/o male with sudden onset of headache and hearing loss followed by AMS.  Recent return from 2 week trip to [place] early May.  Early CSF and MRI findings suggestive for bacterial meningitis.  ID consulted.    Pertinent  Medical History  GERD  Glaucoma  Colon Polyps  HTN  OSA  Thyroid Disease   Significant Hospital Events: Including procedures, antibiotic start and stop dates in addition to other pertinent events   . 5/17 presented to ED, intubated for agitation and airway protection . 5/18 PCCM consulted for admission, lumbar puncture completed, ID consulted.  EEG w/right hemispheric cortical dysfunction, no evidence of seizures.  Started on decadron; precedex off, remain on fentanyl gtt  . CT head 5/23 no evidence acute hemorrhage, infarct, extra-axial collection.  Some increased density dural venous sinuses, no clear evidence of clot . MRI/V 5/24 >> numerous foci of restricted diffusion around the subarachnoid spaces consistent with ongoing bacterial meningitis small amount restricted diffusion in the fourth ventricle but no evidence of wide spread ventriculitis, no evidence for venous thrombosis . 5/26 Slow improvement in neuro status, WBC 36, fever / cultures repeated, on precedex  . 5/27 Sputum culture positive for MRSA, losartan held with rise in cr   Tubes/lines ETT 5/17 >> Foley 5/17 >> 5/19 Foley 5/27 >>   Antibiotics: Ampicillin 5/17 >> 5/19 Ceftriaxone 5/17 (2gm q 12hr) >> 5/21 Vancomycin 5/17 >> 5/20 Acyclovir 5/18 Penicillin G Continuous Infusion 5/21 >>   Micro: 5/18 CSF culture and gram stain>> S. Pneumoniae >> pan sensitive 5/18 CSF fungal cx >>         CSF HSV >> negative 5/18 BCx2 >> negative 5/25 BCx2 >>  5/25 Resp >> MRSA    Interim History / Subjective:  Tmax 101.3  / WBC 39.7k On vent - 40%, PSV wean  Glucose 144-161 Remains on 0.8 of precedex   Objective   Blood pressure (!) 141/50, pulse (!) 57, temperature 99.6 F (37.6 C), temperature source Axillary, resp. rate (!) 35, height 5' 10.98" (1.803 m), weight 112.1 kg, SpO2 95 %.    Vent Mode: PSV;PRVC FiO2 (%):  [40 %] 40 % Set Rate:  [16 bmp] 16 bmp Vt Set:  [600 mL] 600 mL PEEP:  [5 cmH20] 5 cmH20 Pressure Support:  [12 cmH20] 12 cmH20 Plateau Pressure:  [15 cmH20-16 cmH20] 16 cmH20   Intake/Output Summary (Last 24 hours) at 05/09/2021 1118 Last data filed at 05/09/2021 1000 Gross per 24 hour  Intake 2186.74 ml  Output 2735 ml  Net -548.26 ml   Filed Weights   05/07/21 0500 05/08/21 0413 05/09/21 0352  Weight: 113.8 kg 111.8 kg 112.1 kg   Exam: General: critically ill appearing adult male lying in bed on vent in NAD  HEENT: MM pink/moist, ETT, anicteric  Neuro: Awakens to voice, makes eye contact, nods / follows simple commands  CV: s1s2 RRR, no m/r/g PULM: non-labored on PSV, lungs bilaterally coarse GI: soft, bsx4 active  Extremities: warm/dry, trace dependent edema  Skin: small circular red lesion on RUE, no other rashes  Labs/imaging that I havepersonally reviewed  (right click and "Reselect all SmartList Selections" daily)  BMP - Na 146, Cl 113, BUN 90 / Sr Cr 1.57 CBC - 39.7, Hgb 16, platelets 188  Resolved Hospital Problem  list   Lactic acidosis Erythrocytosis Bradycardia  Assessment & Plan:   Pneumococcal meningitis with encephalitis, encephalopathy Recurrent Fever -appreciate ID & Neurology assistance  -continue penicillin G infusion  -follow up blood cultures  -seizure precautions   Severe Sepsis due to pneumococcal meningitis, onset at admission 5/18 Associated Septic shock, on pressors 5/18 - 5/19 -monitor hemodynamics off pressors   Acute hypoxic respiratory failure MRSA Positive Tracheal Aspirate -PRVC 8cc/kg as rest mode  -SBT with goal for  extubation  -minimize precedex for WUA  -VAP prevention measures  -PAD protocol  -follow intermittent CXR (5/25 reviewed, ETT adjusted per RN report) -note tracheal aspirate positive for MRSA, CXR essentially unchanged on 5/25. No new film 5/27. Follow up in am > colonization vs tracheobronchitis vs PNA -zyvox per ID   Atrial fibrillation/flutter.   Suspect secondary to stress state from his underlying illness.  Has been in and out.  Back to atrial fibrillation with stimulation on 5/24, 5/25. MRI/MRA reassuring, no evidence of venous sinus thrombosis -continue scheduled lopressor  -no anticoagulation with risk of ICH with meningitis  -tele monitoring   AKI  Electrolyte imbalance Total body volume overload Hypernatremia -Trend BMP / urinary output -Replace electrolytes as indicated -Avoid nephrotoxic agents, ensure adequate renal perfusion -increase free water to 200 ml Q6 -hold diuresis 5/27  Thrombocytopenia, likely due to sepsis  Leukocytosis  -trend CBC   History of hypertension -continue amlodipine, metoprolol -hold losartan 5/27 with rise in sr cr   Urinary Retention  -foley reinserted 5/27 -begin doxazosin   Best practice (right click and "Reselect all SmartList Selections" daily)  Diet:  Tube Feed    Pain/Anxiety/Delirium protocol (if indicated): Yes (RASS goal 0) VAP protocol (if indicated): Yes DVT prophylaxis: SCD  GI prophylaxis: PPI Glucose control:  SSI Yes Central venous access:  N/A Arterial line:  N/A Foley:  N/A;  Mobility:  bed rest  PT consulted: N/A Last date of multidisciplinary goals of care discussion:  Code Status:  full code Disposition: ICU  Will update family on arrival 5/27   Critical Care Time: 34 minutes       Noe Gens, MSN, APRN, NP-C, AGACNP-BC [facility] 05/09/2021, 11:19 AM   Please see [email or web address] for pager details.   From 7A-7P if no response, please call [phone] After hours, please call  ELink [phone]

## 2021-05-09 NOTE — Progress Notes (Signed)
[facility] Progress Note Patient Name: Hayden Mcbride DOB: 12/28/1960 MRN: [id number]   Date of Service  05/09/2021  HPI/Events of Note  Patient is on BIPAP, oral Metoprolol held, PRN Metoprolol to be given for established heart rate parameters.  eICU Interventions  See above.        Hayden Mcbride 05/09/2021, 11:30 PM

## 2021-05-09 NOTE — Progress Notes (Signed)
[facility] Progress Note Patient Name: MAKELL CYR DOB: 1961-07-10 MRN: [id number]   Date of Service  05/09/2021  HPI/Events of Note  Fentanyl infusion discontinued.  eICU Interventions  Patient's PRN Fentanyl changed from bolus infusion to PRN injection.        Clara Smolen U Tandy Lewin 05/09/2021, 4:20 AM

## 2021-05-09 NOTE — Progress Notes (Signed)
   05/09/21 2034  BiPAP/CPAP/SIPAP  $ Non-Invasive Ventilator  Non-Invasive Vent Set Up  $ Non-Invasive Home Ventilator  Initial  BiPAP/CPAP/SIPAP Pt Type Adult  Mask Type Full face mask  Mask Size Large  Set Rate 15 breaths/min  Respiratory Rate 18 breaths/min  Oxygen Percent 40 %  Minute Ventilation 18.2  Leak 48  BiPAP/CPAP/SIPAP BiPAP  BiPAP/CPAP /SiPAP Vitals  Pulse Rate (!) 129  Resp 16  SpO2 100 %  Bilateral Breath Sounds Rhonchi  MEWS Score/Color  MEWS Score 2  MEWS Score Color Yellow  Placed pt. On vent on NIV mode per MD order pt. Tolerating current settings 1 hour abg to follow.

## 2021-05-09 NOTE — Procedures (Signed)
Extubation Procedure Note  Patient Details:   Name: Hayden Mcbride DOB: 1961-02-09 MRN: [id number]   Airway Documentation:    Vent end date: 05/09/21 Vent end time: 1340   Evaluation  O2 sats: stable throughout Complications: No apparent complications Patient did tolerate procedure well. Bilateral Breath Sounds: Rhonchi,Diminished   Yes  Cuff leak heard prior to extubation.  Esperanza Sheets T 05/09/2021, 1:53 PM

## 2021-05-09 NOTE — Progress Notes (Signed)
Echocardiogram 2D Echocardiogram has been performed.  Hayden Mcbride M 05/09/2021, 2:24 PM

## 2021-05-09 NOTE — Progress Notes (Signed)
  Amiodarone Drug - Drug Interaction Consult Note  Recommendations: Not on AC given risk for ICH - has been in and out of Afib per notes - would consider anticoagulation risk vs benefit conversation if remains in Afib. No adjustments at this time for other medications. Monitor for future medication interactions.   Amiodarone is metabolized by the cytochrome P450 system and therefore has the potential to cause many drug interactions. Amiodarone has an average plasma half-life of 50 days (range 20 to 100 days).   There is potential for drug interactions to occur several weeks or months after stopping treatment and the onset of drug interactions may be slow after initiating amiodarone.   []  Statins: Increased risk of myopathy. Simvastatin- restrict dose to 20mg  daily. Other statins: counsel patients to report any muscle pain or weakness immediately.  []  Anticoagulants: Amiodarone can increase anticoagulant effect. Consider warfarin dose reduction. Patients should be monitored closely and the dose of anticoagulant altered accordingly, remembering that amiodarone levels take several weeks to stabilize.  []  Antiepileptics: Amiodarone can increase plasma concentration of phenytoin, the dose should be reduced. Note that small changes in phenytoin dose can result in large changes in levels. Monitor patient and counsel on signs of toxicity.  [x]  Beta blockers: increased risk of bradycardia, AV block and myocardial depression. Sotalol - avoid concomitant use.  []   Calcium channel blockers (diltiazem and verapamil): increased risk of bradycardia, AV block and myocardial depression.  []   Cyclosporine: Amiodarone increases levels of cyclosporine. Reduced dose of cyclosporine is recommended.  []  Digoxin dose should be halved when amiodarone is started.  []  Diuretics: increased risk of cardiotoxicity if hypokalemia occurs.  []  Oral hypoglycemic agents (glyburide, glipizide, glimepiride): increased risk of  hypoglycemia. Patient's glucose levels should be monitored closely when initiating amiodarone therapy.   []  Drugs that prolong the QT interval:  Torsades de pointes risk may be increased with concurrent use - avoid if possible.  Monitor QTc, also keep magnesium/potassium WNL if concurrent therapy can't be avoided. Marland Kitchen Antibiotics: e.g. fluoroquinolones, erythromycin. . Antiarrhythmics: e.g. quinidine, procainamide, disopyramide, sotalol. . Antipsychotics: e.g. phenothiazines, haloperidol.  . Lithium, tricyclic antidepressants, and methadone.  Thank You,  Antonietta Jewel, PharmD, [place] Clinical Pharmacist  Phone: [phone] 05/09/2021 8:15 PM  Please check AMION for all [facility] phone numbers After 10:00 PM, call [place] [phone]

## 2021-05-09 NOTE — Progress Notes (Signed)
[facility] Progress Note Patient Name: Hayden Mcbride DOB: 10-17-1961 MRN: [id number]   Date of Service  05/09/2021  HPI/Events of Note  Post-operative respiratory failure with associated atrial fibrillation with RVR.  eICU Interventions  Patient placed on BIPAP, Amiodarone bolus + infusion ordered, ABG in one hour. Portable CXR  To r/o aspiration.        Frederik Pear 05/09/2021, 8:35 PM

## 2021-05-09 NOTE — Progress Notes (Signed)
Subjective: On ventilator   Antibiotics:  Anti-infectives (From admission, onward)   Start     Dose/Rate Route Frequency Ordered Stop   05/10/21 0500  linezolid (ZYVOX) IVPB 600 mg        600 mg 300 mL/hr over 60 Minutes Intravenous Every 12 hours 05/09/21 1050     05/09/21 0500  vancomycin (VANCOREADY) IVPB 1500 mg/300 mL  Status:  Discontinued        1,500 mg 150 mL/hr over 120 Minutes Intravenous Every 12 hours 05/08/21 1353 05/09/21 1050   05/08/21 1500  vancomycin (VANCOREADY) IVPB 2000 mg/400 mL        2,000 mg 200 mL/hr over 120 Minutes Intravenous  Once 05/08/21 1353 05/08/21 1714   05/03/21 1215  penicillin G potassium 12 Million Units in dextrose 5 % 500 mL continuous infusion        12 Million Units 41.7 mL/hr over 12 Hours Intravenous Every 12 hours 05/03/21 1123     05/01/21 2200  vancomycin (VANCOREADY) IVPB 1000 mg/200 mL  Status:  Discontinued        1,000 mg 200 mL/hr over 60 Minutes Intravenous Every 8 hours 05/01/21 1312 05/02/21 0902   05/01/21 1400  vancomycin (VANCOREADY) IVPB 1000 mg/200 mL        1,000 mg 200 mL/hr over 60 Minutes Intravenous NOW 05/01/21 1312 05/01/21 1523   04/30/21 1200  vancomycin (VANCOREADY) IVPB 1000 mg/200 mL  Status:  Discontinued        1,000 mg 200 mL/hr over 60 Minutes Intravenous Every 12 hours 04/29/21 2334 05/01/21 1312   04/30/21 0600  acyclovir (ZOVIRAX) 1,000 mg in dextrose 5 % 150 mL IVPB  Status:  Discontinued        1,000 mg 170 mL/hr over 60 Minutes Intravenous Every 8 hours 04/30/21 0243 04/30/21 1119   04/30/21 0000  ampicillin (OMNIPEN) 2 g in sodium chloride 0.9 % 100 mL IVPB  Status:  Discontinued        2 g 300 mL/hr over 20 Minutes Intravenous Every 4 hours 04/29/21 2330 05/01/21 0958   04/29/21 2345  cefTRIAXone (ROCEPHIN) 2 g in sodium chloride 0.9 % 100 mL IVPB  Status:  Discontinued        2 g 200 mL/hr over 30 Minutes Intravenous Every 12 hours 04/29/21 2330 05/03/21 1123   04/29/21 2345   vancomycin (VANCOREADY) IVPB 2000 mg/400 mL        2,000 mg 200 mL/hr over 120 Minutes Intravenous  Once 04/29/21 2334 04/30/21 0240      Medications: Scheduled Meds: . amLODipine  5 mg Per Tube Daily  . chlorhexidine gluconate (MEDLINE KIT)  15 mL Mouth Rinse BID  . Chlorhexidine Gluconate Cloth  6 each Topical Daily  . docusate  100 mg Per Tube BID  . doxazosin  2 mg Per Tube Daily  . feeding supplement (PROSource TF)  45 mL Per Tube TID  . free water  200 mL Per Tube Q6H  . heparin injection (subcutaneous)  5,000 Units Subcutaneous Q8H  . insulin aspart  0-20 Units Subcutaneous Q4H  . mouth rinse  15 mL Mouth Rinse 10 times per day  . metoprolol tartrate  75 mg Per Tube BID  . pantoprazole sodium  40 mg Per Tube Daily  . polyethylene glycol  17 g Per Tube Daily  . sodium chloride flush  10-40 mL Intracatheter Q12H   Continuous Infusions: . sodium chloride Stopped (05/05/21 0949)  .  dexmedetomidine (PRECEDEX) IV infusion 0.4 mcg/kg/hr (05/09/21 1200)  . feeding supplement (VITAL AF 1.2 CAL) 1,000 mL (05/09/21 0410)  . [START ON 05/10/2021] linezolid (ZYVOX) IV    . penicillin g continuous IV infusion 12 Million Units (05/09/21 1022)   PRN Meds:.sodium chloride, acetaminophen (TYLENOL) oral liquid 160 mg/5 mL, bisacodyl, fentaNYL (SUBLIMAZE) injection, LORazepam, metoprolol tartrate, sodium chloride flush    Objective: Weight change: 0.3 kg  Intake/Output Summary (Last 24 hours) at 05/09/2021 1342 Last data filed at 05/09/2021 1200 Gross per 24 hour  Intake 2208.52 ml  Output 2735 ml  Net -526.48 ml   Blood pressure (!) 132/54, pulse 61, temperature (!) 100.8 F (38.2 C), temperature source Oral, resp. rate 17, height 5' 10.98" (1.803 m), weight 112.1 kg, SpO2 95 %. Temp:  [99.3 F (37.4 C)-101.7 F (38.7 C)] 100.8 F (38.2 C) (05/27 1100) Pulse Rate:  [52-73] 61 (05/27 1200) Resp:  [15-35] 17 (05/27 1200) BP: (73-168)/(42-99) 132/54 (05/27 1200) SpO2:  [95 %-99 %]  95 % (05/27 1200) FiO2 (%):  [40 %] 40 % (05/27 1200) Weight:  [112.1 kg] 112.1 kg (05/27 0352)  Physical Exam: Physical Exam Constitutional:      Appearance: He is well-developed. He is ill-appearing.     Interventions: He is intubated.  HENT:     Head: Normocephalic and atraumatic.  Eyes:     General:        Right eye: No discharge.        Left eye: No discharge.     Extraocular Movements: Extraocular movements intact.     Conjunctiva/sclera: Conjunctivae normal.  Cardiovascular:     Rate and Rhythm: Regular rhythm. Tachycardia present.     Heart sounds: No murmur heard. No gallop.   Pulmonary:     Effort: Pulmonary effort is normal. No respiratory distress. He is intubated.     Breath sounds: Rhonchi present. No wheezing.  Abdominal:     General: There is no distension.     Palpations: Abdomen is soft. There is no mass.  Musculoskeletal:        General: Normal range of motion.     Cervical back: Normal range of motion and neck supple.  Skin:    General: Skin is warm and dry.     Findings: No erythema or rash.  Neurological:     Mental Status: He is alert.     Comments: He is following commands and tracking with his eyes   He responds to my voice and seems to be understanding me. Lines do not appear overtly infected CBC:    BMET Recent Labs    05/08/21 1000 05/09/21 0409  NA 147* 146*  K 4.1 3.9  CL 112* 113*  CO2 27 27  GLUCOSE 171* 157*  BUN 65* 90*  CREATININE 0.96 1.57*  CALCIUM 7.6* 7.6*     Liver Panel  No results for input(s): PROT, ALBUMIN, AST, ALT, ALKPHOS, BILITOT, BILIDIR, IBILI in the last 72 hours.     Sedimentation Rate No results for input(s): ESRSEDRATE in the last 72 hours. C-Reactive Protein No results for input(s): CRP in the last 72 hours.  Micro Results: Recent Results (from the past 720 hour(s))  SARS CORONAVIRUS 2 (TAT 6-24 HRS) Nasopharyngeal Nasopharyngeal Swab     Status: None   Collection Time: 04/29/21 10:57 PM    Specimen: Nasopharyngeal Swab  Result Value Ref Range Status   SARS Coronavirus 2 NEGATIVE NEGATIVE Final    Comment: (NOTE) SARS-CoV-2 target nucleic  acids are NOT DETECTED.  The SARS-CoV-2 RNA is generally detectable in upper and lower respiratory specimens during the acute phase of infection. Negative results do not preclude SARS-CoV-2 infection, do not rule out co-infections with other pathogens, and should not be used as the sole basis for treatment or other patient management decisions. Negative results must be combined with clinical observations, patient history, and epidemiological information. The expected result is Negative.  Fact Sheet for Patients: HairSlick.no  Fact Sheet for Healthcare Providers: quierodirigir.com  This test is not yet approved or cleared by the [place] FDA and  has been authorized for detection and/or diagnosis of SARS-CoV-2 by FDA under an Emergency Use Authorization (EUA). This EUA will remain  in effect (meaning this test can be used) for the duration of the COVID-19 declaration under Se ction 564(b)(1) of the Act, 21 U.S.C. section 360bbb-3(b)(1), unless the authorization is terminated or revoked sooner.  Performed at [facility], [address], [place], [place] [address]   CSF culture     Status: None   Collection Time: 04/30/21  2:57 AM   Specimen: CSF; Cerebrospinal Fluid  Result Value Ref Range Status   Specimen Description CSF  Final   Special Requests Normal  Final   Gram Stain   Final    GRAM POSITIVE COCCI WBC PRESENT, PREDOMINANTLY MONONUCLEAR CRITICAL RESULT CALLED TO, READ BACK BY AND VERIFIED WITH: RN MEGAN PLUMMER BY MESSAN H. AT 0345 ON 5 18 2022 CYTOSPIN SMEAR Performed at [facility], [address], [place], [place] [address]    Culture FEW STREPTOCOCCUS PNEUMONIAE  Final   Report Status 05/02/2021 FINAL  Final   Organism ID, Bacteria  STREPTOCOCCUS PNEUMONIAE  Final      Susceptibility   Streptococcus pneumoniae - MIC*    ERYTHROMYCIN <=0.12 SENSITIVE Sensitive     LEVOFLOXACIN 0.5 SENSITIVE Sensitive     VANCOMYCIN 0.5 SENSITIVE Sensitive     PENICILLIN (meningitis) <=0.06 SENSITIVE Sensitive     PENO - penicillin <=0.06      PENICILLIN (non-meningitis) <=0.06 SENSITIVE Sensitive     PENICILLIN (oral) <=0.06 SENSITIVE Sensitive     CEFTRIAXONE (non-meningitis) <=0.12 SENSITIVE Sensitive     CEFTRIAXONE (meningitis) <=0.12 SENSITIVE Sensitive     * FEW STREPTOCOCCUS PNEUMONIAE  Culture, fungus without smear     Status: None (Preliminary result)   Collection Time: 04/30/21  2:57 AM   Specimen: CSF; Cerebrospinal Fluid  Result Value Ref Range Status   Specimen Description CSF  Final   Special Requests NONE  Final   Culture   Final    NO FUNGUS ISOLATED AFTER 9 DAYS Performed at [facility], [address], [place], [place] [address]    Report Status PENDING  Incomplete  MRSA PCR Screening     Status: Abnormal   Collection Time: 04/30/21  5:17 AM   Specimen: Nasal Mucosa; Nasopharyngeal  Result Value Ref Range Status   MRSA by PCR POSITIVE (A) NEGATIVE Final    Comment:        The GeneXpert MRSA Assay (FDA approved for NASAL specimens only), is one component of a comprehensive MRSA colonization surveillance program. It is not intended to diagnose MRSA infection nor to guide or monitor treatment for MRSA infections. RESULT CALLED TO, READ BACK BY AND VERIFIED WITH: PLUMBER,M RN 04/30/2021 AT 6672 SKEEN,P Performed at [facility], [address], [place], [place] [address]   HSV 1/2 Ab IgG/IgM CSF  Status: None   Collection Time: 04/30/21  5:27 AM   Specimen: Cerebrospinal Fluid  Result Value Ref Range Status   HSV 1/2 Ab, IgM, CSF 0.66 <=0.89 IV Final    Comment: (NOTE) INTERPRETIVE INFORMATION: Herpes Simplex Virus                          Type 1 and/or 2 Antibodies,                           IgM by ELISA, CSF  0.89 IV or Less .......... Negative: No significant                             level of detectable HSV IgM                             antibody.  0.90 - 1.09 IV ........... Equivocal: Questionable                             presence of IgM antibodies.                             Repeat testing in 10-14 days                             may be helpful.  1.10 IV or Greater ....... Positive: IgM antibody to HSV                             detected, which may indicate a                             current or recent infection.                             However, low levels of IgM                             antibodies may occasionally                             persist for more than 12                             months post-infection. The detection of antibodies to herpes simplex virus in CSF may indicate central nervou s system infection. However, consideration must be given to possible contamination by blood or transfer of serum antibodies across the blood-brain barrier. Fourfold or greater rise in CSF antibodies to herpes on specimens at least 4 weeks apart are found in 74-94 % of patients with herpes encephalitis. Specificity of the test based on a single CSF testing is not established. Presently PCR is the primary means of establishing a diagnosis of herpes encephalitis. This test was developed and its performance characteristics determined by [employer]. It has not been cleared or approved by the [place] Food and Drug Administration. This test was performed in a  CLIA certified laboratory and is intended for clinical purposes.    HSV 1/2 Ab Screen IgG, CSF <0.34 <=0.89 IV Final    Comment: (NOTE) INTERPRETIVE INFORMATION: Herpes Simplex Virus Type 1 and/or 2                    Antibodies, IgG CSF  0.89 IV or Less .......... Negative: No significant                             level of detectable HSV IgG                             antibody.  0.90  - 1.09 IV ........... Equivocal: Questionable                             presence of IgG antibodies.                             Repeat testing in 10-14 days                             may be helpful.  1.10 IV or Greater ....... Positive: IgG antibody to HSV                             detected, which may indicate                             a current or past HSV                             infection. The detection of antibodies to herpes simplex virus in CSF may indicate central nervous system infection. However, consideration must be given to possible contamination by blood or transfer of serum antibodies across the blood-brain barrier. Fourfold or greater rise in CSF antibodies to herpes on specimens at l east 4 weeks apart are found in 74-94 % of patients with herpes encephalitis. Specificity of the test based on a single CSF testing is not established. Presently PCR is the primary means of establishing a diagnosis of herpes encephalitis. This test was developed and its performance characteristics determined by [employer]. It has not been cleared or approved by the [place] Food and Drug Administration. This test was performed in a CLIA certified laboratory and is intended for clinical purposes. Performed At: [facility] [address] [place], [place] [id number] Hilton Sinclair I MD [phone]   Culture, blood (Routine X 2) w Reflex to ID Panel     Status: None   Collection Time: 04/30/21 11:30 AM   Specimen: BLOOD  Result Value Ref Range Status   Specimen Description BLOOD LEFT ANTECUBITAL  Final   Special Requests   Final    BOTTLES DRAWN AEROBIC AND ANAEROBIC Blood Culture adequate volume   Culture   Final    NO GROWTH 5 DAYS Performed at [facility], [place] [address], [place], [place] [address]    Report Status 05/05/2021 FINAL  Final  Culture, blood (Routine X 2) w Reflex to ID Panel  Status: None   Collection Time: 04/30/21 11:47 AM    Specimen: BLOOD LEFT FOREARM  Result Value Ref Range Status   Specimen Description BLOOD LEFT FOREARM  Final   Special Requests   Final    BOTTLES DRAWN AEROBIC ONLY Blood Culture results may not be optimal due to an inadequate volume of blood received in culture bottles   Culture   Final    NO GROWTH 5 DAYS Performed at [facility], [place] [address] [place], [place] [address]    Report Status 05/05/2021 FINAL  Final  Culture, Respiratory w Gram Stain     Status: None   Collection Time: 05/07/21 11:58 AM   Specimen: Tracheal Aspirate; Respiratory  Result Value Ref Range Status   Specimen Description TRACHEAL ASPIRATE  Final   Special Requests Immunocompromised  Final   Gram Stain   Final    ABUNDANT WBC PRESENT, PREDOMINANTLY PMN ABUNDANT GRAM POSITIVE COCCI RARE YEAST Performed at [facility], [address], [place], [place] [address]    Culture   Final    MODERATE METHICILLIN RESISTANT STAPHYLOCOCCUS AUREUS CORRECTED ON 05/27 AT 7824: PREVIOUSLY REPORTED AS MODERATE STAPHYLOCOCCUS AUREUS   Report Status 05/09/2021 FINAL  Final   Organism ID, Bacteria METHICILLIN RESISTANT STAPHYLOCOCCUS AUREUS  Final      Susceptibility   Methicillin resistant staphylococcus aureus - MIC*    CIPROFLOXACIN >=8 RESISTANT Resistant     ERYTHROMYCIN >=8 RESISTANT Resistant     GENTAMICIN <=0.5 SENSITIVE Sensitive     OXACILLIN >=4 RESISTANT Resistant     TETRACYCLINE <=1 SENSITIVE Sensitive     VANCOMYCIN 1 SENSITIVE Sensitive     TRIMETH/SULFA >=320 RESISTANT Resistant     CLINDAMYCIN <=0.25 SENSITIVE Sensitive     RIFAMPIN <=0.5 SENSITIVE Sensitive     Inducible Clindamycin NEGATIVE Sensitive     * MODERATE METHICILLIN RESISTANT STAPHYLOCOCCUS AUREUS CORRECTED ON 05/27 AT 2353: PREVIOUSLY REPORTED AS MODERATE STAPHYLOCOCCUS AUREUS  Culture, blood (Routine X 2) w Reflex to ID Panel     Status: None (Preliminary result)   Collection Time: 05/07/21  3:50 PM   Specimen: BLOOD   Result Value Ref Range Status   Specimen Description BLOOD LEFT ANTECUBITAL  Final   Special Requests   Final    BOTTLES DRAWN AEROBIC AND ANAEROBIC Blood Culture results may not be optimal due to an inadequate volume of blood received in culture bottles   Culture   Final    NO GROWTH 2 DAYS Performed at [facility], [address] [place], [place] [address]    Report Status PENDING  Incomplete  Culture, blood (Routine X 2) w Reflex to ID Panel     Status: None (Preliminary result)   Collection Time: 05/07/21  3:57 PM   Specimen: BLOOD  Result Value Ref Range Status   Specimen Description BLOOD LEFT ANTECUBITAL  Final   Special Requests   Final    BOTTLES DRAWN AEROBIC AND ANAEROBIC Blood Culture adequate volume   Culture   Final    NO GROWTH 2 DAYS Performed at [facility], [place] [address] [place], [place] [address]    Report Status PENDING  Incomplete    Studies/Results: Overnight EEG with video  Result Date: 05/08/2021 Lora Havens, MD     05/08/2021  4:39 PM Patient Name: Hayden Mcbride MRN: [id number] Epilepsy Attending: Lora Havens Referring Physician/Provider: Dr Kathrynn Speed Duration:  05/07/2021 1532 to 05/09/2019 1009  Patient  history: 60 year old with strep pneumo meningitis. EEG to evaluate for seizure.  Level of alertness: awake, asleep  AEDs during EEG study: None  Technical aspects: This EEG study was done with scalp electrodes positioned according to the 10-20 International system of electrode placement. Electrical activity was acquired at a sampling rate of $Remov'500Hz'mUCPuU$  and reviewed with a high frequency filter of $RemoveB'70Hz'aSqOerkI$  and a low frequency filter of $RemoveB'1Hz'wTTwwWMR$ . EEG data were recorded continuously and digitally stored.  Description: No clear posterior dominant rhythm was seen. EEG showed continuous generalized polymorphic mixed frequencies with predominantly 5 to 9 Hz theta and alpha activity as well as intermittent generalized 2 to 3 Hz delta slowing, at  times with triphasic morphology.  Hyperventilation and photic stimulation were not performed.    ABNORMALITY - Continuous slow, generalized  IMPRESSION: This study is suggestive of moderate diffuse encephalopathy, non specific etiology. No seizures or definite epileptiform discharges were seen throughout the recording.  Priyanka [name]      Assessment/Plan:  INTERVAL HISTORY:  Continues to make progress neurologically but continues to have fevers and worsening leukocytosis with white count now at 39,700  Renal function has worsened on vancomycin  Principal Problem:   Pneumococcal meningitis Active Problems:   Encounter for orogastric tube placement   AMS (altered mental status)   Meningitis   Acute respiratory failure with hypoxemia (HCC)   Status post peripherally inserted central catheter (PICC) central line placement   FUO (fever of unknown origin)   Staphylococcus aureus pneumonia (HCC)    Hayden Mcbride is a 60 y.o. male with severe pneumococcal meningitis  #`1 Pneumococcal meningitis:   Continue high-dose penicillin  MRV was reassuring.  #2 Fevers: He is growing some Staph aureus from the lungs  Due to worsening renal function would switch to Zyvox.  Blood cultures no growth  Would get a 2D echocardiogram would also get a CT of the chest without contrast  I spent greater than 35  minutes with the patient including greater than 50% of time in face to face counsel of the patient reviewing his radiographic data laboratory data microbiological data and in coordination of his care.   Dr Baxter Flattery is available this weekend for questions.     LOS: 9 days   Alcide Evener 05/09/2021, 1:42 PM

## 2021-05-10 ENCOUNTER — Inpatient Hospital Stay (HOSPITAL_COMMUNITY): Payer: Commercial Managed Care - PPO

## 2021-05-10 DIAGNOSIS — G001 Pneumococcal meningitis: Secondary | ICD-10-CM | POA: Diagnosis not present

## 2021-05-10 LAB — COMPREHENSIVE METABOLIC PANEL
ALT: 86 U/L — ABNORMAL HIGH (ref 0–44)
AST: 82 U/L — ABNORMAL HIGH (ref 15–41)
Albumin: 1.7 g/dL — ABNORMAL LOW (ref 3.5–5.0)
Alkaline Phosphatase: 36 U/L — ABNORMAL LOW (ref 38–126)
Anion gap: 9 (ref 5–15)
BUN: 56 mg/dL — ABNORMAL HIGH (ref 8–23)
CO2: 31 mmol/L (ref 22–32)
Calcium: 7.6 mg/dL — ABNORMAL LOW (ref 8.9–10.3)
Chloride: 108 mmol/L (ref 98–111)
Creatinine, Ser: 0.96 mg/dL (ref 0.61–1.24)
GFR, Estimated: >60 mL/min (ref >60)
Glucose, Bld: 203 mg/dL — ABNORMAL HIGH (ref 70–99)
Potassium: 3.3 mmol/L — ABNORMAL LOW (ref 3.5–5.1)
Sodium: 148 mmol/L — ABNORMAL HIGH (ref 135–145)
Total Bilirubin: 0.5 mg/dL (ref 0.3–1.2)
Total Protein: 4.7 g/dL — ABNORMAL LOW (ref 6.5–8.1)

## 2021-05-10 LAB — CBC
HCT: 46.3 % (ref 39.0–52.0)
Hemoglobin: 15 g/dL (ref 13.0–17.0)
MCH: 31.5 pg (ref 26.0–34.0)
MCHC: 32.4 g/dL (ref 30.0–36.0)
MCV: 97.3 fL (ref 80.0–100.0)
Platelets: 168 10*3/uL (ref 150–400)
RBC: 4.76 MIL/uL (ref 4.22–5.81)
RDW: 13.7 % (ref 11.5–15.5)
WBC: 31.6 10*3/uL — ABNORMAL HIGH (ref 4.0–10.5)
nRBC: 0 % (ref 0.0–0.2)

## 2021-05-10 LAB — GLUCOSE, CAPILLARY
Glucose-Capillary: 153 mg/dL — ABNORMAL HIGH (ref 70–99)
Glucose-Capillary: 138 mg/dL — ABNORMAL HIGH (ref 70–99)
Glucose-Capillary: 132 mg/dL — ABNORMAL HIGH (ref 70–99)
Glucose-Capillary: 142 mg/dL — ABNORMAL HIGH (ref 70–99)
Glucose-Capillary: 133 mg/dL — ABNORMAL HIGH (ref 70–99)

## 2021-05-10 IMAGING — CT CT CHEST W/O CM
2 of 4 series · 15 of 36 positions shown, 18 images · non-contrast
Comparison: Chest radiograph, [DATE] and older studies.

CLINICAL DATA: Fever of unknown origin.

EXAM:
CT CHEST WITHOUT CONTRAST
TECHNIQUE: Multidetector CT imaging of the chest was performed following the
standard protocol without IV contrast.

[Series 3: chest wo · axial · 0.78mm/px · z∈[-78,+200]mm · 12 of 165 slices shown, 15 images]
[im 13/165  mediastinal]
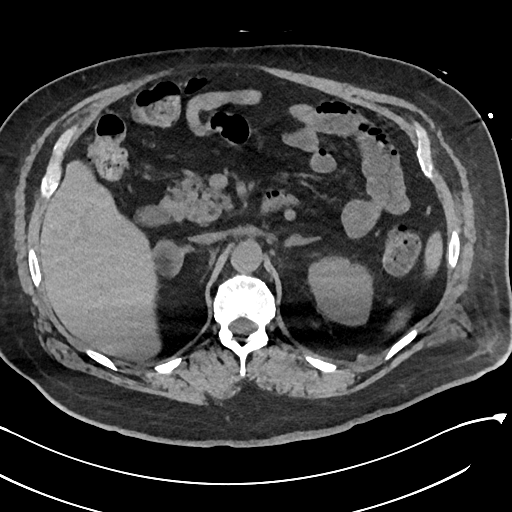
[im 13/165  lung]
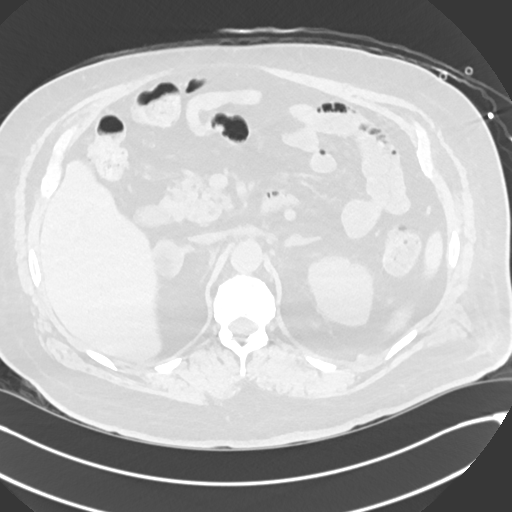
[im 26/165  lung]
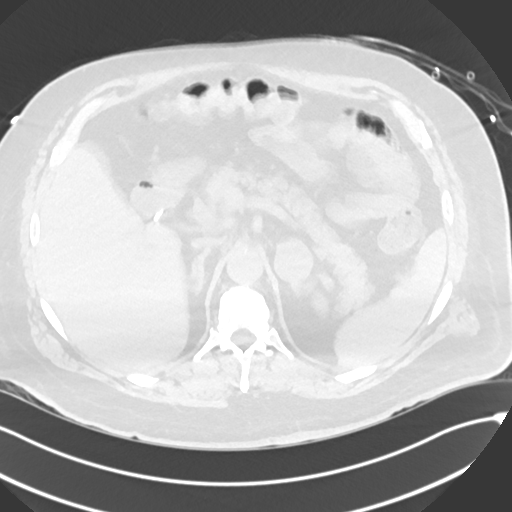
[im 38/165  lung]
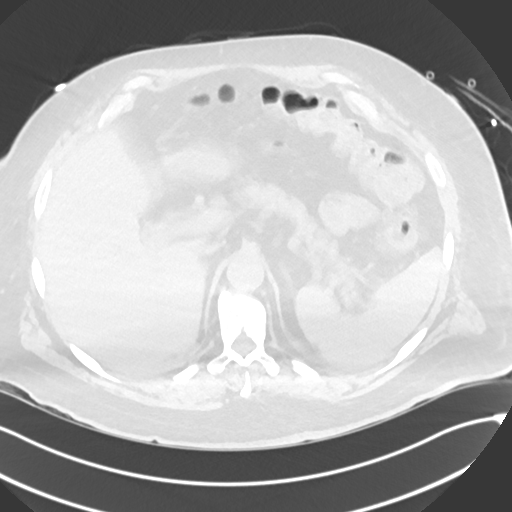
[im 51/165  lung]
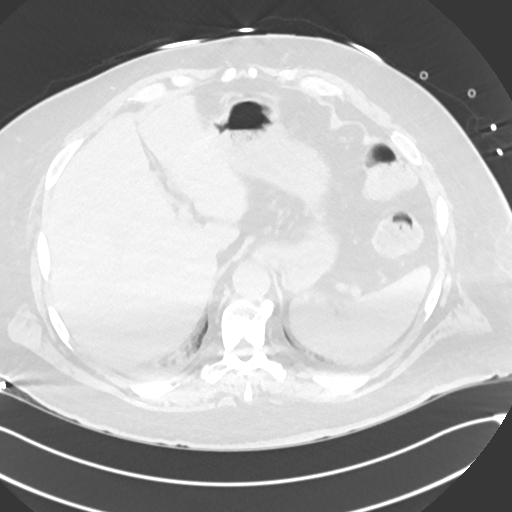
[im 64/165  mediastinal]
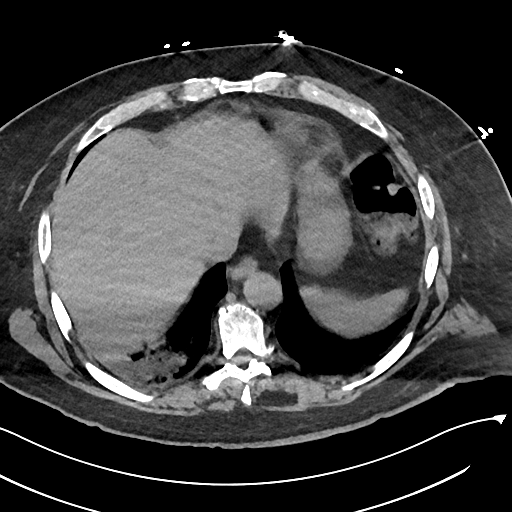
[im 64/165  lung]
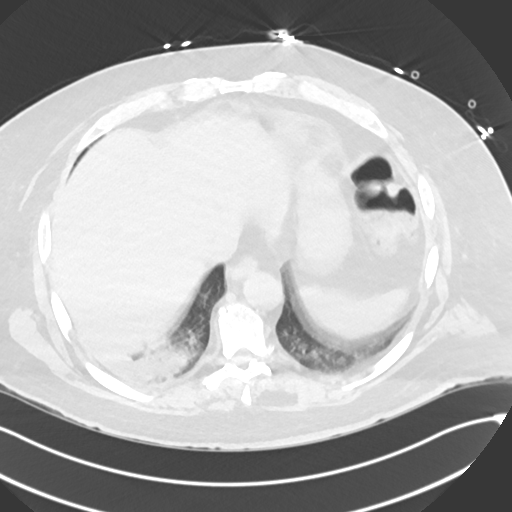
[im 76/165  lung]
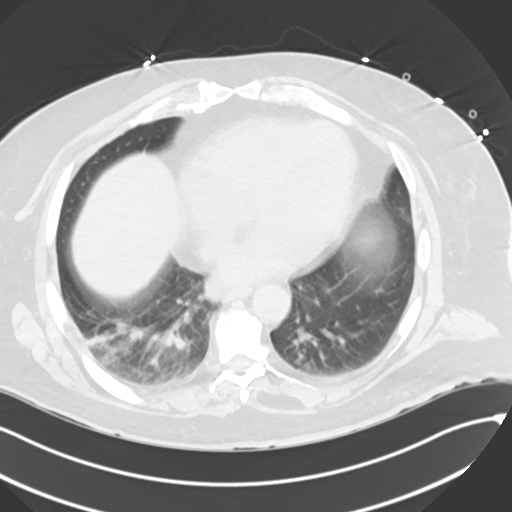
[im 89/165  lung]
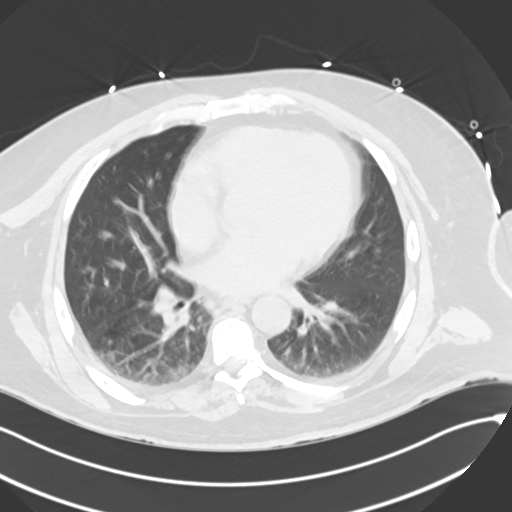
[im 101/165  lung]
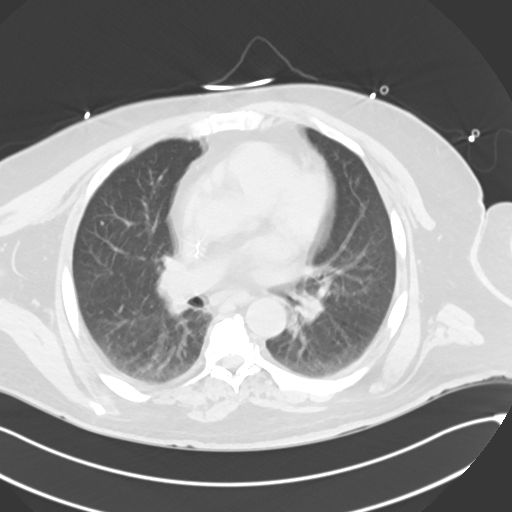
[im 114/165  mediastinal]
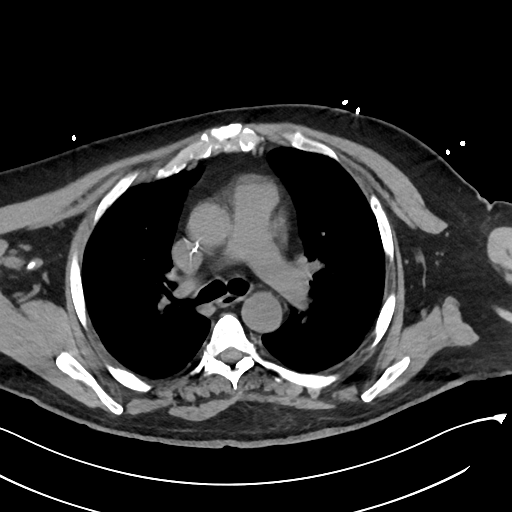
[im 114/165  lung]
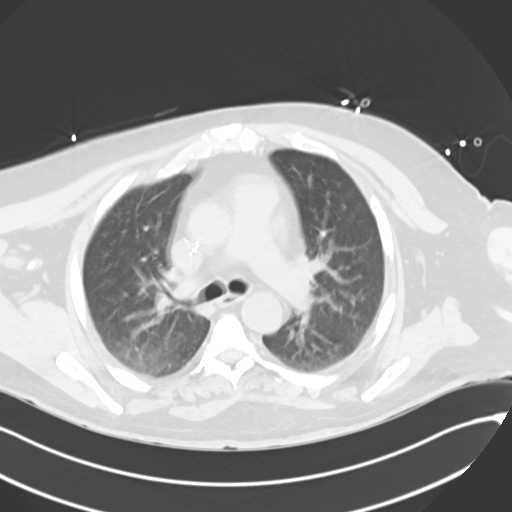
[im 127/165  lung]
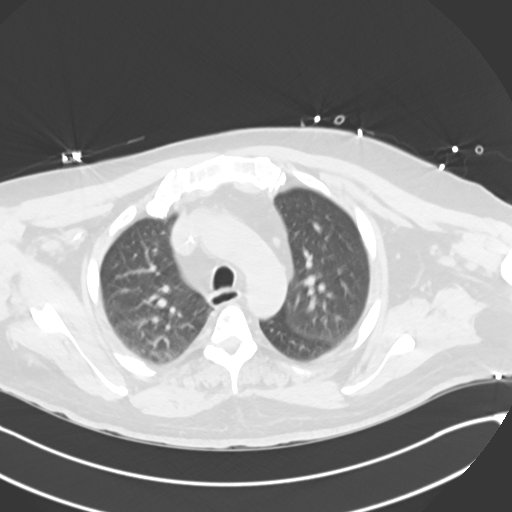
[im 139/165  lung]
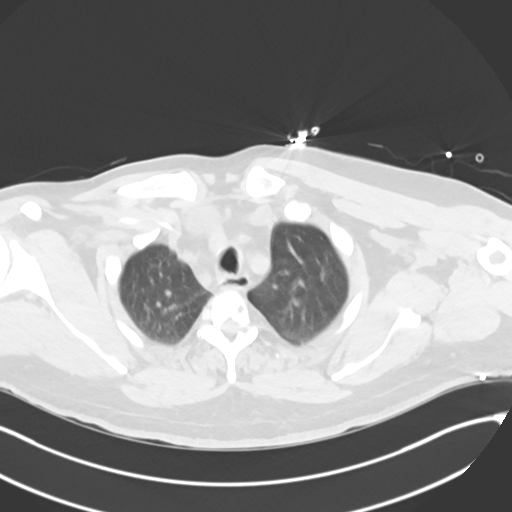
[im 152/165  lung]
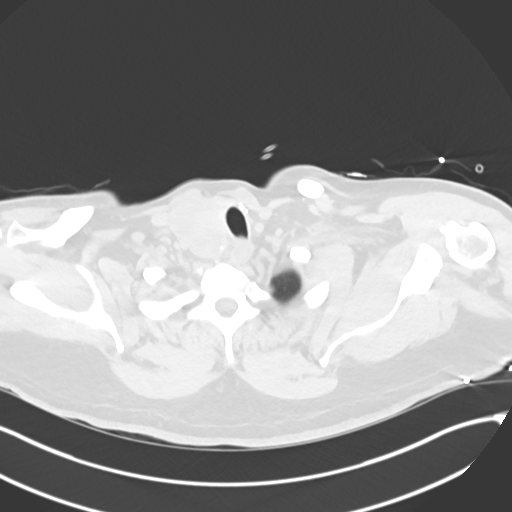

[Series 6: cor · coronal · 0.67mm/px · 3 of 164 slices shown]
[im 33/164  lung]
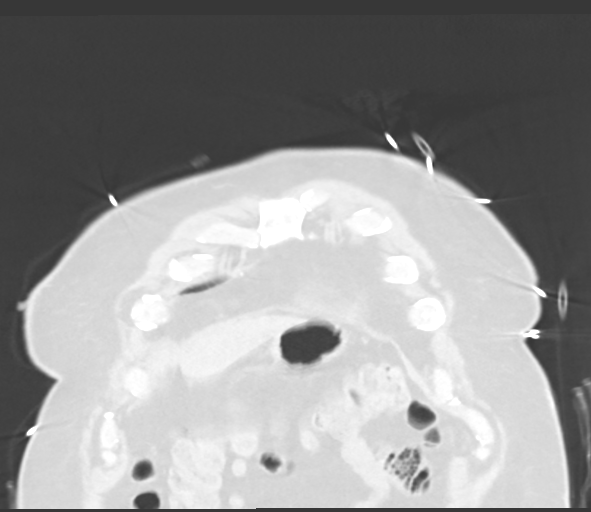
[im 66/164  lung]
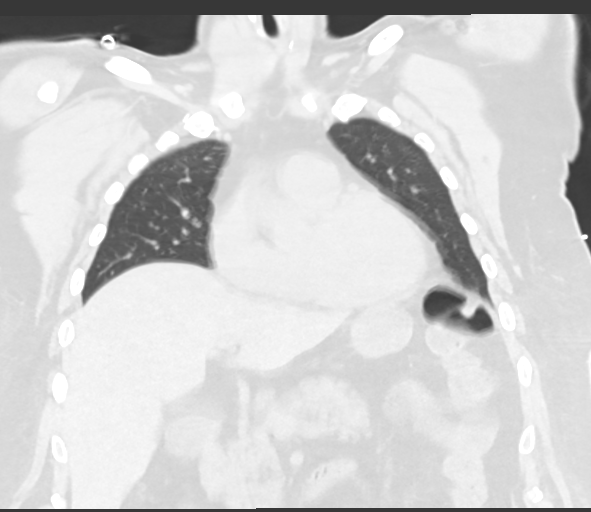
[im 98/164  lung]
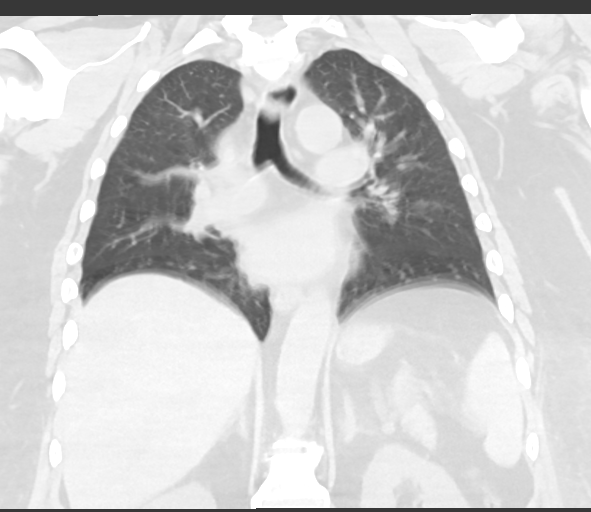

[15 of 36 positions shown; findings below may reference images not displayed]

FINDINGS: Cardiovascular: Normal sized heart. No pericardial effusion. No
coronary artery calcifications. Great vessels normal in caliber. No
aortic atherosclerosis.

Mediastinum/Nodes: Previous left thyroidectomy. Right thyroid lobe
is enlarged and heterogeneous consistent with nodules. Either
combined nodules or 1 large nodule spans 4.2 cm. No mediastinal or
hilar masses or enlarged lymph nodes. Trachea and esophagus are
unremarkable.

Lungs/Pleura: There is consolidation in the posterior base of the
right lower lobe consistent with pneumonia. There is minor
atelectasis in the dependent left lower lobe. Focus of calcification
in the right middle lobe. Mild scarring at the right apex. Remainder
of the lungs is clear.

No pleural effusion or pneumothorax.

Upper Abdomen: No acute findings. Right adrenal myelolipoma, 2.9 cm.
Left adrenal mass, 3.3 cm, average Hounsfield units of 54,
nonspecific. Status post cholecystectomy.

Musculoskeletal: No fracture or acute finding. No aggressive bone
lesion.
IMPRESSION: 1. Consolidation in the posterior base of the right lower lobe
consistent with pneumonia.
2. No other acute abnormality.
3. Bilateral adrenal masses, the left measuring 3.3 cm,
indeterminate, not meeting criteria for an adenoma on unenhanced
imaging. Recommend follow-up adrenal MRI for further assessment.
Right adrenal myelolipoma, benign.

## 2021-05-10 MED ORDER — CHLORHEXIDINE GLUCONATE CLOTH 2 % EX PADS
6.0000 | MEDICATED_PAD | Freq: Every day | CUTANEOUS | Status: DC
  Administered 2021-05-11 – 2021-05-19 (×7): 6 via TOPICAL

## 2021-05-10 MED ORDER — POTASSIUM CHLORIDE 20 MEQ PO PACK: 20.0000 meq | PACK | ORAL | Status: DC

## 2021-05-10 MED ORDER — POTASSIUM CHLORIDE 10 MEQ/50ML IV SOLN
10.0000 meq | INTRAVENOUS | Status: AC
  Administered 2021-05-10 (×4): 10 meq via INTRAVENOUS
  Filled 2021-05-10 (×4): qty 50

## 2021-05-10 MED ORDER — DEXTROSE 5 % IV SOLN
INTRAVENOUS | Status: DC
  Administered 2021-05-10: 75 mL/h via INTRAVENOUS

## 2021-05-10 MED ORDER — ORAL CARE MOUTH RINSE
15.0000 mL | Freq: Two times a day (BID) | OROMUCOSAL | Status: DC
  Administered 2021-05-10 – 2021-05-19 (×18): 15 mL via OROMUCOSAL

## 2021-05-10 NOTE — Progress Notes (Addendum)
NAME:  Hayden Mcbride, MRN:  [id number], DOB:  June 16, 1961, LOS: 27 ADMISSION DATE:  04/29/2021, CONSULTATION DATE:  05/10/21 REFERRING MD:  EDP, CHIEF COMPLAINT:  AMS   Brief summary:  60 y/o male with sudden onset of headache and hearing loss followed by AMS.  Recent return from 2 week trip to [place] early May.  Early CSF and MRI findings suggestive for bacterial meningitis.  ID consulted.    Pertinent  Medical History  GERD  Glaucoma  Colon Polyps  HTN  OSA  Thyroid Disease   Significant Hospital Events: Including procedures, antibiotic start and stop dates in addition to other pertinent events   . 5/17 presented to ED, intubated for agitation and airway protection. Ampicillin, vanc & rocephin started.  5/18 CSF culture and gram stain>> S. Pneumoniae >> pan sensitive . 5/18 PCCM consulted for admission, lumbar puncture completed, ID consulted.  EEG w/right hemispheric cortical dysfunction, no evidence of seizures.  Started on decadron; precedex off, remain on fentanyl gtt acyclovir started . 5/19 amp stopped . 5/20 vanc stopped . Penicillin G Continuous Infusion 5/21 >> . CT head 5/23 no evidence acute hemorrhage, infarct, extra-axial collection.  Some increased density dural venous sinuses, no clear evidence of clot . MRI/V 5/24 >> numerous foci of restricted diffusion around the subarachnoid spaces consistent with ongoing bacterial meningitis small amount restricted diffusion in the fourth ventricle but no evidence of wide spread ventriculitis, no evidence for venous thrombosis . 5/26 Slow improvement in neuro status, WBC 36, fever / cultures repeated, on precedex. vanc started . 5/27 Sputum culture positive for MRSA, losartan held with rise in cr. Extubated. Foley replaced after. vanc changed to zyvox off precedex . 528 required BIPAP early am hours. PANDA tube removed as coiled in back of throat     Interim History / Subjective:   No distress.  I just removed BiPAP mask  currently 5 L/min Objective   Blood pressure (Abnormal) 162/72, pulse 78, temperature 99.4 F (37.4 C), temperature source Axillary, resp. rate (Abnormal) 26, height 5' 10.98" (1.803 m), weight 109.7 kg, SpO2 100 %.    Vent Mode: PSV;CPAP FiO2 (%):  [25 %-40 %] 25 % PEEP:  [5 cmH20] 5 cmH20 Pressure Support:  [8 QIO96-29 cmH20] 8 cmH20   Intake/Output Summary (Last 24 hours) at 05/10/2021 0732 Last data filed at 05/10/2021 0600 Gross per 24 hour  Intake 1021.93 ml  Output 3075 ml  Net -2053.07 ml   Filed Weights   05/08/21 0413 05/09/21 0352 05/10/21 0500  Weight: 111.8 kg 112.1 kg 109.7 kg   Exam:  General 59 year old white male he is resting in bed he is in no acute distress currently HEENT normocephalic atraumatic, prefers to orient head/neck facing her right but can move spontaneously to the left.  His mucous membranes are dry neck veins flat Pulmonary: Some scattered rhonchi with weak cough mechanics diminished bases no accessory use currently 5 L/min Cardiac: Regular rate and rhythm without murmur rub or gallop Abdomen obese soft not tender Extremities warm dry dependent edema brisk capillary refill Neuro awake follows commands moves all extremities he is right-handed, he seems a little weaker on right but can move spontaneously and once moving strength seems to improve he is not able to phonate currently anything that I can understand GU clear yellow urine Via Foley catheter  Labs/imaging that I havepersonally reviewed  (right click and "Reselect all SmartList Selections" daily)  See below   Resolved Hospital Problem list   Lactic  acidosis Erythrocytosis Bradycardia Severe Sepsis due to pneumococcal meningitis, onset at admission 5/18 Associated Septic shock, on pressors 5/18 - 5/19 Thrombocytopenia, likely due to sepsis   Assessment & Plan:   Pneumococcal meningitis with encephalitis, encephalopathy Recurrent Fever -appreciate ID & Neurology assistance   Plan Seizure precautions Penicillin G infusion per ID Supportive care PT consult   Acute hypoxic respiratory failure MRSA PNA vs tracheobronchitis Extubated 5/27, cough mechanics at this point are primary issue Portable chest x-ray personally reviewed from 5/27 shows right basilar airspace disease Plan zyvox per ID Supplemental oxygen, pulse oximetry Aggressive pulmonary hygiene  Atrial fibrillation/flutter.  Suspect secondary to stress state from his underlying illness.  Has been in and out.  Back to atrial fibrillation with stimulation on 5/24, 5/25. MRI/MRA reassuring, no evidence of venous sinus thrombosis Plan Cont tele  Cont amio for now  No ac give risk of ICH w/ meningitis   AKI  Improved. Vanc and arb stopped 5/27 Plan Strict intake output A.m. chemistry  Fluid and electrolyte imbalance: Hypernatremia, hypokalemia  Plan Change free water to D5 water via tube as currently does not have nasogastric route but think very likely he will be able to tolerate orals in the next 24 hours IV KCl replacement A.m. chemistry  Leukocytosis-->improving  Plan  Am cbc  History of hypertension Plan Hold norvasc and lopressor as can't give orally    Urinary Retention  -foley reinserted 5/27 Plan Resume  doxazosin 2mg  either oral or Via tube 5/29  Hyperglycemia Plan ssi    Best practice (right click and "Reselect all SmartList Selections" daily)  Diet:  NPO   going to keep tube feeding tube out for now, if mental status continues to improve may be able to trial p.o. tomorrow Pain/Anxiety/Delirium protocol (if indicated): Yes (RASS goal 0) VAP protocol (if indicated): Not indicated DVT prophylaxis: SCD  GI prophylaxis: N/A Glucose control:  SSI Yes Central venous access:  N/A Arterial line:  N/A Foley:  Yes, and it is still needed;  Mobility:  OOB  PT consulted: N/A Last date of multidisciplinary goals of care discussion:  Code Status:  full code Disposition:  ICU     Critical Care Time: 33 min   Erick Colace ACNP-BC [place] Pager # [phone] OR # [phone] if no answer  Pulmonary critical care attending:  This is a 61 year old gentleman with sudden onset headache and hearing loss.  Had a trip to [place] recently.  His CSF findings concerning for bacterial meningitis.  Ultimately diagnosed with pneumococcal meningitis.  Was septic, intubated on mechanical life support.  Extubated yesterday.  Mental status has slowly begun to improve.  White blood cell count improving.  BP (!) 172/58   Pulse 65   Temp (!) 97.4 F (36.3 C) (Axillary)   Resp 11   Ht 5' 10.98" (1.803 m)   Wt 109.7 kg   SpO2 99%   BMI 33.75 kg/m   General: Elderly male sitting up in bed, will follow commands he is definitely hard of hearing at baseline was hard of hearing prior to event. HEENT: Tracking appropriately Heart: Regular rhythm S1-S2 Lungs: Bilateral breath sounds, no crackles no wheeze Abdomen: Obese, soft nontender  Labs: Reviewed Sodium 148, BUN 56, creatinine 0.96 Albumin 1.7 White blood cell count 31.6  Assessment: Pneumococcal meningitis encephalitis, acute metabolic encephalopathy secondary to above Sepsis, septic shock secondary above, resolved Leukocytosis persistent MRSA pneumonia/tracheobronchitis Atrial fibrillation/flutter AKI, acute renal failure  Plan: Continue penicillin G Continue Zyvox Continue  pulmonary toilet As needed BiPAP Remains in ICU for at risk need of intubation. Continue telemetry Continue amiodarone Likely need replacement of NG tube. Continue SLP involvement. Unsure if he is able to swallow on his own. In the meantime can go ahead and place core track for next week.  This patient is critically ill with multiple organ system failure; which, requires frequent high complexity decision making, assessment, support, evaluation, and titration of therapies. This was completed through the  application of advanced monitoring technologies and extensive interpretation of multiple databases. During this encounter critical care time was devoted to patient care services described in this note for 32 minutes.   Garner Nash, DO [facility] 05/10/2021 10:24 AM

## 2021-05-10 NOTE — Evaluation (Signed)
Physical Therapy Evaluation Patient Details Name: Hayden Mcbride MRN: [id number] DOB: [birth year] Today's Date: 05/10/2021   History of Present Illness  Pt is 60 yo male who presented with HA and ear pain became combative with slurred speech and intubated 5/18 > extubated 5/27. Found to have bacterial meningitis. PMH: HTN, GERD, OSA on CPAP, thyroid disease, colonic polyps.  Clinical Impression  Pt admitted with above diagnosis. Pt received awake and alert by PT and OT. Pt has delays in processing and some confusion about what has happened to him but good recall of home and family situation. Pt required mod A +2 to come to EOB due to weakness and fatigue with all activity. He transferred to recliner with mod A +2 HHA. Recommend CIR level therapies for pt to return to PLOF. Pt currently with functional limitations due to the deficits listed below (see PT Problem List). Pt will benefit from skilled PT to increase their independence and safety with mobility to allow discharge to the venue listed below.       Follow Up Recommendations CIR;Supervision/Assistance - 24 hour    Equipment Recommendations  Other (comment) (TBD)    Recommendations for Other Services Rehab consult     Precautions / Restrictions Precautions Precautions: Fall Precaution Comments: generalized weakness, delayed processing Restrictions Weight Bearing Restrictions: No      Mobility  Bed Mobility Overal bed mobility: Needs Assistance Bed Mobility: Supine to Sit     Supine to sit: Mod assist;+2 for safety/equipment;HOB elevated     General bed mobility comments: cues for sequencing.  He required assist to initiate movement and assist to move LEs off the bed and to lift trunk    Transfers Overall transfer level: Needs assistance Equipment used: 2 person hand held assist Transfers: Sit to/from [employer] Transfers Sit to Stand: Max assist;+2 physical assistance;+2 safety/equipment Stand pivot transfers:  Mod assist;+2 physical assistance;+2 safety/equipment       General transfer comment: Pt attempted to stand x 2 with mod A +2 progressing to max A +2 but unable to fully extend hips and trunk.  He transferred to the recliner with mod A +2 to lift buttocks, for balance, and to pivot feet. Knees buckling partially  Ambulation/Gait             General Gait Details: unable to step feet safely  Stairs            Wheelchair Mobility    Modified Rankin (Stroke Patients Only)       Balance Overall balance assessment: Needs assistance Sitting-balance support: Feet supported Sitting balance-Leahy Scale: Fair Sitting balance - Comments: able to maintain static sitting with min guard assist   Standing balance support: Bilateral upper extremity supported Standing balance-Leahy Scale: Zero Standing balance comment: unable to fully achieve standing                             Pertinent Vitals/Pain Pain Assessment: No/denies pain Faces Pain Scale: No hurt    Home Living Family/patient expects to be discharged to:: Private residence Living Arrangements: Spouse/significant other Available Help at Discharge: Family;Available PRN/intermittently Type of Home: House Home Access: Stairs to enter Entrance Stairs-Rails: Right Entrance Stairs-Number of Steps: 3 Home Layout: One level Home Equipment: Shower seat - built in      Prior Function Level of Independence: Independent         Comments: Pt was fully independent.  He works as a Corporate investment banker.  He enjoys playing golf     Hand Dominance   Dominant Hand: Right    Extremity/Trunk Assessment   Upper Extremity Assessment Upper Extremity Assessment: Defer to OT evaluation RUE Deficits / Details: grossly 2/5.  Pt reports h/o rotator cuff injury to Rt shoulder, and indicates discomfort that is normal for him, in his Rt shoulder RUE Coordination: decreased gross motor;decreased fine motor LUE Deficits /  Details: grossly 2+/5 LUE Coordination: decreased gross motor;decreased fine motor    Lower Extremity Assessment Lower Extremity Assessment: Generalized weakness    Cervical / Trunk Assessment Cervical / Trunk Assessment: Other exceptions Cervical / Trunk Exceptions: truncal weakness noted  Communication   Communication: Expressive difficulties (low volume)  Cognition Arousal/Alertness: Awake/alert Behavior During Therapy: Flat affect Overall Cognitive Status: Impaired/Different from baseline Area of Impairment: Attention;Following commands;Problem solving;Orientation                 Orientation Level: Situation Current Attention Level: Sustained   Following Commands: Follows one step commands consistently;Follows one step commands inconsistently     Problem Solving: Slow processing;Decreased initiation;Requires verbal cues General Comments: Pt oriented to time, hospital, but not fully oriented to situation.  He follows commands consistently, but is slow to process info.  Speech is difficult to understand.      General Comments General comments (skin integrity, edema, etc.): VSS, SpO2 100% on 3L O2    Exercises     Assessment/Plan    PT Assessment Patient needs continued PT services  PT Problem List Decreased strength;Decreased range of motion;Decreased activity tolerance;Decreased balance;Decreased mobility;Decreased coordination;Decreased cognition;Decreased knowledge of use of DME;Decreased safety awareness;Decreased knowledge of precautions       PT Treatment Interventions DME instruction;Gait training;Functional mobility training;Therapeutic activities;Therapeutic exercise;Balance training;Patient/family education;Cognitive remediation;Neuromuscular re-education;Stair training    PT Goals (Current goals can be found in the Care Plan section)  Acute Rehab PT Goals Patient Stated Goal: "I want to go home" PT Goal Formulation: With patient Time For Goal  Achievement: 05/24/21 Potential to Achieve Goals: Good    Frequency Min 3X/week   Barriers to discharge        Co-evaluation PT/OT/SLP Co-Evaluation/Treatment: Yes Reason for Co-Treatment: Complexity of the patient's impairments (multi-system involvement);Necessary to address cognition/behavior during functional activity;For patient/therapist safety PT goals addressed during session: Mobility/safety with mobility;Balance OT goals addressed during session: ADL's and self-care;Strengthening/ROM       AM-PAC PT "6 Clicks" Mobility  Outcome Measure Help needed turning from your back to your side while in a flat bed without using bedrails?: Total Help needed moving from lying on your back to sitting on the side of a flat bed without using bedrails?: Total Help needed moving to and from a bed to a chair (including a wheelchair)?: Total Help needed standing up from a chair using your arms (e.g., wheelchair or bedside chair)?: Total Help needed to walk in hospital room?: Total Help needed climbing 3-5 steps with a railing? : Total 6 Click Score: 6    End of Session Equipment Utilized During Treatment: Gait belt Activity Tolerance: Patient tolerated treatment well Patient left: in chair;with call bell/phone within reach Nurse Communication: Mobility status;Need for lift equipment (stedy back to bed) PT Visit Diagnosis: Muscle weakness (generalized) (M62.81);Difficulty in walking, not elsewhere classified (R26.2);Unsteadiness on feet (R26.81)    Time: 9509-3267 PT Time Calculation (min) (ACUTE ONLY): 44 min   Charges:   PT Evaluation $PT Eval Moderate Complexity: 1 Mod          Amariona Rathje, PT  Acute Rehab Services  Pager [phone] Office [place] 05/10/2021, 4:09 PM

## 2021-05-10 NOTE — Evaluation (Signed)
Occupational Therapy Evaluation Patient Details Name: Hayden Mcbride MRN: [id number] DOB: [birth year] Today's Date: 05/10/2021    History of Present Illness Pt is 60 yo male who presented with HA and ear pain became combative with slurred speech and intubated 5/18 > extubated 5/27. Found to have bacterial meningitis. PMH: HTN, GERD, OSA on CPAP, thyroid disease, colonic polyps.   Clinical Impression   Pt admitted with above. He demonstrates the below listed deficits and will benefit from continued OT to maximize safety and independence with BADLs.  Pt presents to OT with generalized weakness, impaired balance, decreased activity tolerance, impaired communication and impaired cognition. Pt was able to move to EOB with mod A +2 for safety and was able to maintain EOB sitting with min guard assist.  He transferred to the recliner with mod A +2.  He currently requires mod - total A for ADLs.  PTA, pt lived with his wife and worked full time as a Corporate investment banker and was fully independent with ADLs and IADLs.  Recommend CIR.        Follow Up Recommendations  CIR    Equipment Recommendations  3 in 1 bedside commode    Recommendations for Other Services Rehab consult     Precautions / Restrictions Precautions Precautions: Fall Precaution Comments: generalized weakness, delayed processing      Mobility Bed Mobility Overal bed mobility: Needs Assistance Bed Mobility: Supine to Sit     Supine to sit: Mod assist;+2 for safety/equipment;HOB elevated     General bed mobility comments: cues for sequencing.  He required assist to initiate movement and assist to move LEs off the bed and to lift trunk    Transfers Overall transfer level: Needs assistance Equipment used: 2 person hand held assist Transfers: Sit to/from [employer] Transfers Sit to Stand: Max assist;+2 physical assistance;+2 safety/equipment Stand pivot transfers: Mod assist;+2 physical assistance;+2  safety/equipment       General transfer comment: Pt attempted to stand x 2 with mod A +2 progressing to max A +2 but unable to fully extend hips and trunk.  He transferred to the recliner with mod A +2 to lift buttocks, for balance, and to pivot feet    Balance Overall balance assessment: Needs assistance Sitting-balance support: Feet supported Sitting balance-Leahy Scale: Fair Sitting balance - Comments: able to maintain static sitting with min guard assist   Standing balance support: Bilateral upper extremity supported Standing balance-Leahy Scale: Zero Standing balance comment: unable to fully achieve standing                           ADL either performed or assessed with clinical judgement   ADL Overall ADL's : Needs assistance/impaired Eating/Feeding: NPO   Grooming: Wash/dry hands;Wash/dry face;Moderate assistance;Sitting   Upper Body Bathing: Maximal assistance;Sitting   Lower Body Bathing: Maximal assistance;Sit to/from stand;Sitting/lateral leans   Upper Body Dressing : Maximal assistance;Sitting   Lower Body Dressing: Total assistance;Sit to/from stand;Sitting/lateral leans   Toilet Transfer: Moderate assistance;+2 for physical assistance;+2 for safety/equipment;Squat-pivot;BSC   Toileting- Clothing Manipulation and Hygiene: Total assistance;Sitting/lateral lean;Sit to/from stand       Functional mobility during ADLs: Moderate assistance;+2 for physical assistance;+2 for safety/equipment (squat pivot transfer)       Vision Baseline Vision/History: Wears glasses Wears Glasses: At all times Patient Visual Report: No change from baseline Additional Comments: will benefit from further assessment     Perception Perception Perception Tested?: Yes   Praxis Praxis Praxis  tested?: Deficits Deficits: Initiation    Pertinent Vitals/Pain Pain Assessment: No/denies pain     Hand Dominance Right   Extremity/Trunk Assessment Upper Extremity  Assessment Upper Extremity Assessment: Generalized weakness;RUE deficits/detail;LUE deficits/detail RUE Deficits / Details: grossly 2/5.  Pt reports h/o rotator cuff injury to Rt shoulder, and indicates discomfort that is normal for him, in his Rt shoulder RUE Coordination: decreased gross motor;decreased fine motor LUE Deficits / Details: grossly 2+/5 LUE Coordination: decreased gross motor;decreased fine motor   Lower Extremity Assessment Lower Extremity Assessment: Defer to PT evaluation   Cervical / Trunk Assessment Cervical / Trunk Assessment: Other exceptions (truncal weakness noted)   Communication Communication Communication: Expressive difficulties (low volume)   Cognition Arousal/Alertness: Awake/alert Behavior During Therapy: Flat affect   Area of Impairment: Attention;Following commands;Problem solving;Orientation                 Orientation Level: Situation Current Attention Level: Sustained   Following Commands: Follows one step commands consistently;Follows one step commands inconsistently     Problem Solving: Slow processing;Decreased initiation;Requires verbal cues General Comments: Pt oriented to time, hospital, but not fully oriented to situation.  He follows commands consistently, but is slow to process info.  Speech is difficult to understand.   General Comments  VSS throughout session.  Sp02 100% on 3.5L 02    Exercises     Shoulder Instructions      Home Living Family/patient expects to be discharged to:: Private residence Living Arrangements: Spouse/significant other Available Help at Discharge: Family;Available PRN/intermittently Type of Home: House Home Access: Stairs to enter [employer] of Steps: 3 Entrance Stairs-Rails: Right Home Layout: One level     Bathroom Shower/Tub: Tub/shower unit;Walk-in shower   Bathroom Toilet: Standard     Home Equipment: Shower seat - built in          Prior Functioning/Environment  Level of Independence: Independent        Comments: Pt was fully independent.  He works as a Corporate investment banker.  He enjoys playing golf        OT Problem List: Decreased strength;Decreased range of motion;Impaired vision/perception;Decreased coordination;Decreased cognition;Decreased safety awareness;Decreased knowledge of use of DME or AE;Cardiopulmonary status limiting activity;Impaired UE functional use;Obesity      OT Treatment/Interventions: Self-care/ADL training;Therapeutic exercise;Neuromuscular education;DME and/or AE instruction;Therapeutic activities;Cognitive remediation/compensation;Visual/perceptual remediation/compensation;Patient/family education;Balance training    OT Goals(Current goals can be found in the care plan section) Acute Rehab OT Goals Patient Stated Goal: "I want to go home" OT Goal Formulation: With patient Time For Goal Achievement: 05/23/21 Potential to Achieve Goals: Good ADL Goals Pt Will Perform Grooming: with mod assist;standing Pt Will Perform Upper Body Bathing: with min assist;sitting Pt Will Perform Lower Body Bathing: with mod assist;sit to/from stand;with adaptive equipment Pt Will Perform Upper Body Dressing: with min assist;sitting Pt Will Perform Lower Body Dressing: with max assist;sit to/from stand Pt Will Transfer to Toilet: with min assist;stand pivot transfer;bedside commode Pt/caregiver will Perform Home Exercise Program: Increased ROM;Increased strength;Right Upper extremity;Left upper extremity;With Supervision;With written HEP provided  OT Frequency: Min 2X/week   Barriers to D/C:            Co-evaluation PT/OT/SLP Co-Evaluation/Treatment: Yes Reason for Co-Treatment: For patient/therapist safety;To address functional/ADL transfers;Necessary to address cognition/behavior during functional activity   OT goals addressed during session: ADL's and self-care;Strengthening/ROM      AM-PAC OT "6 Clicks" Daily Activity      Outcome Measure Help from another person eating meals?: Total Help from another person  taking care of personal grooming?: A Lot Help from another person toileting, which includes using toliet, bedpan, or urinal?: A Lot Help from another person bathing (including washing, rinsing, drying)?: A Lot Help from another person to put on and taking off regular upper body clothing?: A Lot Help from another person to put on and taking off regular lower body clothing?: Total 6 Click Score: 10   End of Session Equipment Utilized During Treatment: Oxygen Nurse Communication: Mobility status;Need for lift equipment  Activity Tolerance: Patient tolerated treatment well Patient left: in chair;with call bell/phone within reach  OT Visit Diagnosis: Unsteadiness on feet (R26.81);Muscle weakness (generalized) (M62.81);Cognitive communication deficit (R41.841)                Time: 9355-2174 OT Time Calculation (min): 48 min Charges:  OT General Charges $OT Visit: 1 Visit OT Evaluation $OT Eval Moderate Complexity: 1 Mod OT Treatments $Therapeutic Activity: 8-22 mins  Nilsa Nutting., OTR/L Acute Rehabilitation Services Pager [phone] Office [place], [place] 05/10/2021, 3:46 PM

## 2021-05-10 NOTE — Evaluation (Signed)
Clinical/Bedside Swallow Evaluation Patient Details  Name: Hayden Mcbride MRN: [id number] Date of Birth: [birth year]  Today's Date: 05/10/2021 Time: SLP Start Time (ACUTE ONLY): 0941 SLP Stop Time (ACUTE ONLY): 0956 SLP Time Calculation (min) (ACUTE ONLY): 15 min  Past Medical History:  Past Medical History:  Diagnosis Date  . Allergy   . Dry eyes   . GERD (gastroesophageal reflux disease)   . Glaucoma   . Hx of adenomatous colonic polyps 07/08/2006   06/2006 - diminutive adenoma 04/19/2015 - diminutive adenoma and one polyp lost - repeat colonoscopy 2021  . Hypertension   . Post-operative nausea and vomiting   . Sleep apnea    wears CPAP  . Thyroid disease    long ago- half thyroid removed ~20 yrs ago    Past Surgical History:  Past Surgical History:  Procedure Laterality Date  . BREATH TEK H PYLORI N/A 12/24/2014   Procedure: BREATH TEK H PYLORI;  Surgeon: Alphonsa Overall, MD;  Location: [facility];  Service: General;  Laterality: N/A;  . CHOLECYSTECTOMY    . COLONOSCOPY    . EYE SURGERY     age 60   . 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 POLYPECTOMY    . SHOULDER SURGERY     left shoulder  . THYROID SURGERY     HPI:  60 year old man admitted with pneumococcal meningitis complicated by encephalopathy, acute respiratory failure requiring airway protection and mechanical ventilation. Recent return from 2 week trip to [place] early May.  Pt has persistent encephalopathy, slowly improving. ETT 517-5/27.  CXR 5/27 concerning for possible RLL pna.  MRI 5/24 c/w bacterial meningitis.  Prior hx: GERD, Glaucoma, Colon Polyps, HTN, OSA, Thyroid Disease   Assessment / Plan / Recommendation Clinical Impression  Pt presents with clinical indicators of pharyngeal dysphagia in setting of VDRF requiring 10 day intubation.  Pt appears very weak over all and during OME was unable to execute lingual strength/ROM maneuvers.  Pt could not generate volitional swallow.  SLP provided oral  care prior to administration of PO trials with thick brown secretions removed from oral cavity, especially on palatte.  Pt exhibited prompt oral response to ice chips. Hyolaryngeal elevation severely reduced on palpation.  With small amount of water by spoon there was prolonged wet cough.  Suction was used to assist in clearing.  Pt exhibited mild cough on 1 of 4 trials of ice chips.    Recommend pt remain NPO with alternate means of nutrition, hydration, and medication.  Pt may have ice chips in moderation, after good oral care, when fully awake/alert, with upright positioning and supervision.  SLP Visit Diagnosis: Dysphagia, oropharyngeal phase (R13.12)    Aspiration Risk  Moderate aspiration risk;Severe aspiration risk    Diet Recommendation NPO   Medication Administration: Via alternative means    Other  Recommendations Oral Care Recommendations:   Oral care QID;  Oral care prior to ice chip/H20    Follow up Recommendations  (TBD)      Frequency and Duration min 2x/week  2 weeks       Prognosis Prognosis for Safe Diet Advancement: Good      Swallow Study   General HPI: 60 year old man admitted with pneumococcal meningitis complicated by encephalopathy, acute respiratory failure requiring airway protection and mechanical ventilation. Recent return from 2 week trip to [place] early May.  Pt has persistent encephalopathy, slowly improving. ETT 517-5/27.  CXR 5/27 concerning for possible RLL pna.  MRI  5/24 c/w bacterial meningitis.  Prior hx: GERD, Glaucoma, Colon Polyps, HTN, OSA, Thyroid Disease Type of Study: Bedside Swallow Evaluation Previous Swallow Assessment: None Diet Prior to this Study: NPO Temperature Spikes Noted: No Respiratory Status: Nasal cannula History of Recent Intubation: Yes Length of Intubations (days): 10 days Date extubated: 05/09/21 Behavior/Cognition: Alert;Cooperative;Pleasant mood Oral Cavity Assessment: Dried secretions Oral Care Completed by  SLP: Yes Oral Cavity - Dentition: Adequate natural dentition Self-Feeding Abilities: Needs assist Patient Positioning: Upright in bed Baseline Vocal Quality: Low vocal intensity;Hoarse;Breathy Volitional Cough: Weak Volitional Swallow: Unable to elicit    Oral/Motor/Sensory Function Overall Oral Motor/Sensory Function: Mild impairment Facial ROM: Within Functional Limits Facial Symmetry: Within Functional Limits Lingual ROM: Reduced right;Reduced left Lingual Symmetry: Within Functional Limits Lingual Strength: Reduced Velum: Within Functional Limits Mandible: Within Functional Limits   Ice Chips Ice chips: Impaired Presentation: Spoon Pharyngeal Phase Impairments: Cough - Immediate;Decreased hyoid-laryngeal movement   Thin Liquid Thin Liquid: Impaired Presentation: Straw Pharyngeal  Phase Impairments: Cough - Immediate;Decreased hyoid-laryngeal movement    Nectar Thick Nectar Thick Liquid: Not tested   Honey Thick Honey Thick Liquid: Not tested   Puree Puree: Not tested   Solid     Solid: Not tested      Hayden Savage, MA, [place] Office: [phone]; Pager (5/28): [phone] 05/10/2021,10:10 AM

## 2021-05-11 ENCOUNTER — Inpatient Hospital Stay (HOSPITAL_COMMUNITY): Payer: Commercial Managed Care - PPO

## 2021-05-11 DIAGNOSIS — J15211 Pneumonia due to Methicillin susceptible Staphylococcus aureus: Secondary | ICD-10-CM | POA: Diagnosis not present

## 2021-05-11 DIAGNOSIS — G001 Pneumococcal meningitis: Secondary | ICD-10-CM | POA: Diagnosis not present

## 2021-05-11 DIAGNOSIS — J9601 Acute respiratory failure with hypoxia: Secondary | ICD-10-CM | POA: Diagnosis not present

## 2021-05-11 LAB — BASIC METABOLIC PANEL
Anion gap: 4 — ABNORMAL LOW (ref 5–15)
BUN: 36 mg/dL — ABNORMAL HIGH (ref 8–23)
CO2: 30 mmol/L (ref 22–32)
Calcium: 7.8 mg/dL — ABNORMAL LOW (ref 8.9–10.3)
Chloride: 111 mmol/L (ref 98–111)
Creatinine, Ser: 0.7 mg/dL (ref 0.61–1.24)
GFR, Estimated: >60 mL/min (ref >60)
Glucose, Bld: 142 mg/dL — ABNORMAL HIGH (ref 70–99)
Potassium: 3.2 mmol/L — ABNORMAL LOW (ref 3.5–5.1)
Sodium: 145 mmol/L (ref 135–145)

## 2021-05-11 LAB — GLUCOSE, CAPILLARY
Glucose-Capillary: 102 mg/dL — ABNORMAL HIGH (ref 70–99)
Glucose-Capillary: 118 mg/dL — ABNORMAL HIGH (ref 70–99)
Glucose-Capillary: 121 mg/dL — ABNORMAL HIGH (ref 70–99)
Glucose-Capillary: 125 mg/dL — ABNORMAL HIGH (ref 70–99)
Glucose-Capillary: 132 mg/dL — ABNORMAL HIGH (ref 70–99)
Glucose-Capillary: 136 mg/dL — ABNORMAL HIGH (ref 70–99)

## 2021-05-11 LAB — CBC
HCT: 42.2 % (ref 39.0–52.0)
Hemoglobin: 13.8 g/dL (ref 13.0–17.0)
MCH: 31.9 pg (ref 26.0–34.0)
MCHC: 32.7 g/dL (ref 30.0–36.0)
MCV: 97.7 fL (ref 80.0–100.0)
Platelets: 167 10*3/uL (ref 150–400)
RBC: 4.32 MIL/uL (ref 4.22–5.81)
RDW: 13.5 % (ref 11.5–15.5)
WBC: 22.7 10*3/uL — ABNORMAL HIGH (ref 4.0–10.5)
nRBC: 0 % (ref 0.0–0.2)

## 2021-05-11 IMAGING — DX DG ABDOMEN 1V
2 series · 2 of 2 positions shown · non-contrast
Comparison: [DATE]

CLINICAL DATA: 60-year-old male status post nasogastric tube
placement.

EXAM:
ABDOMEN - 1 VIEW

[abdomen supine (1 of 2)]
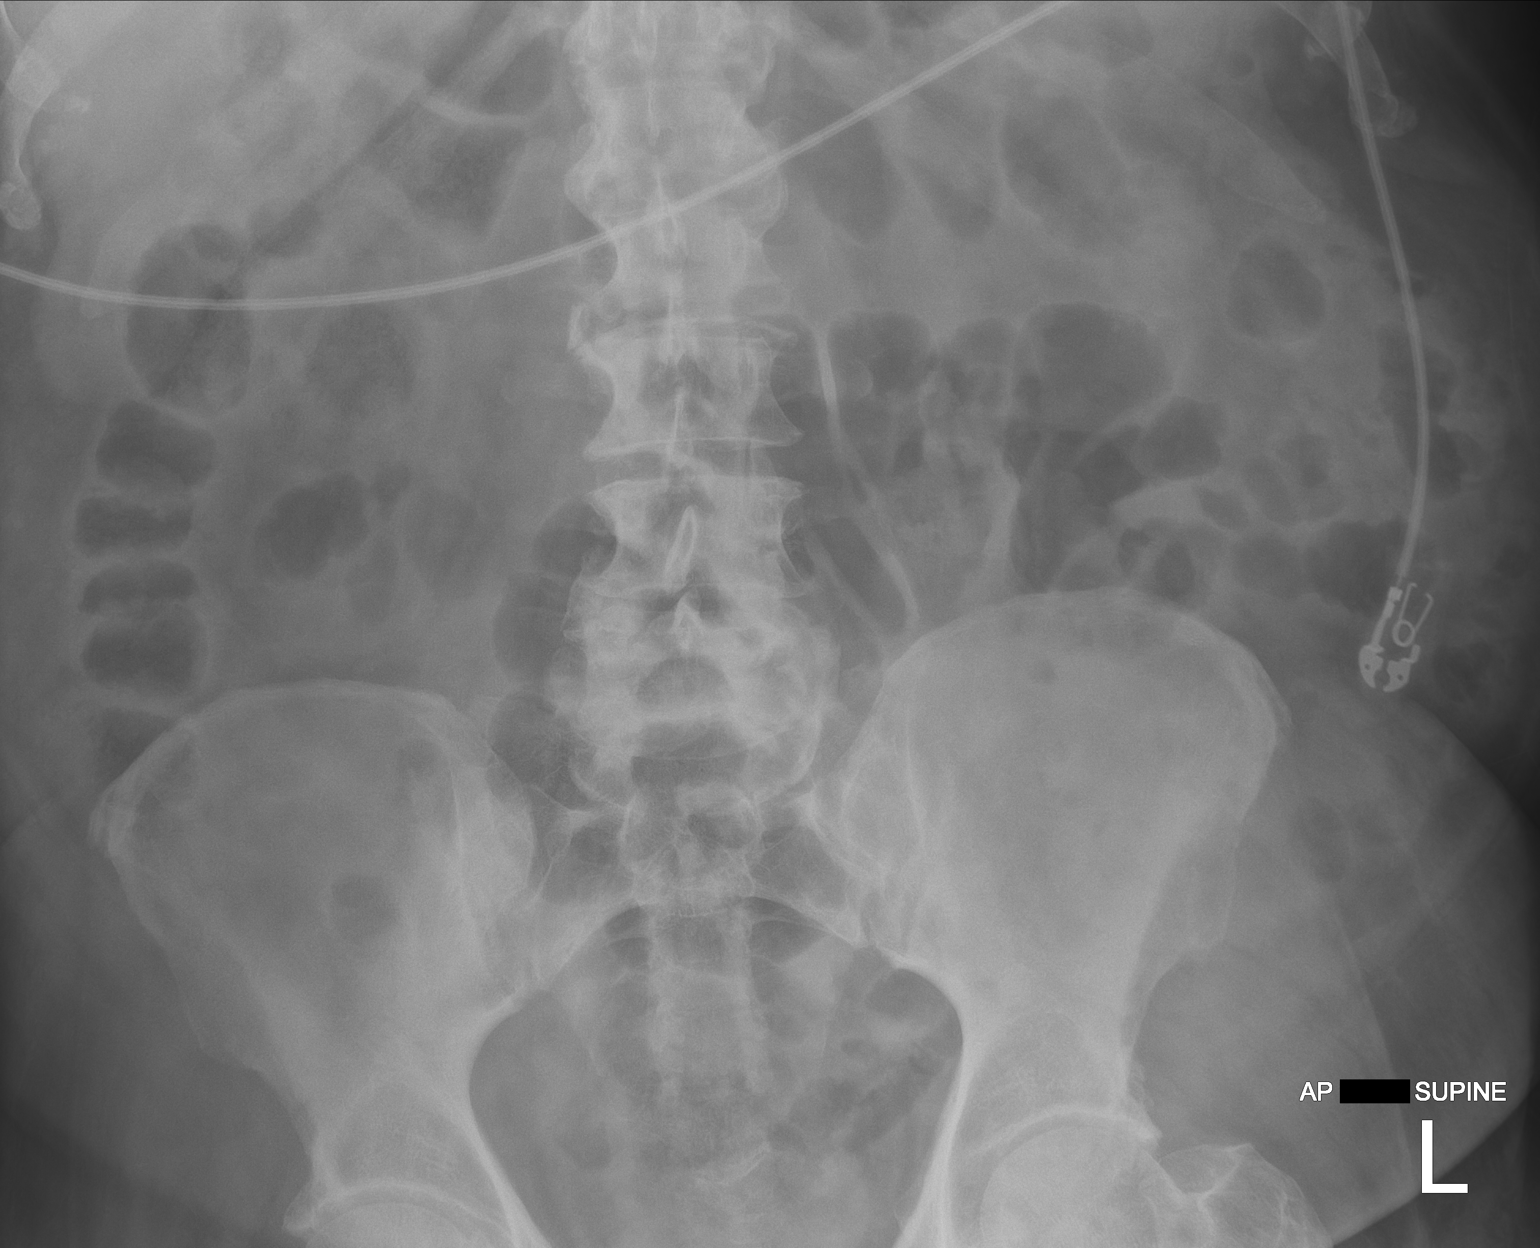

[abdomen supine (2 of 2)]
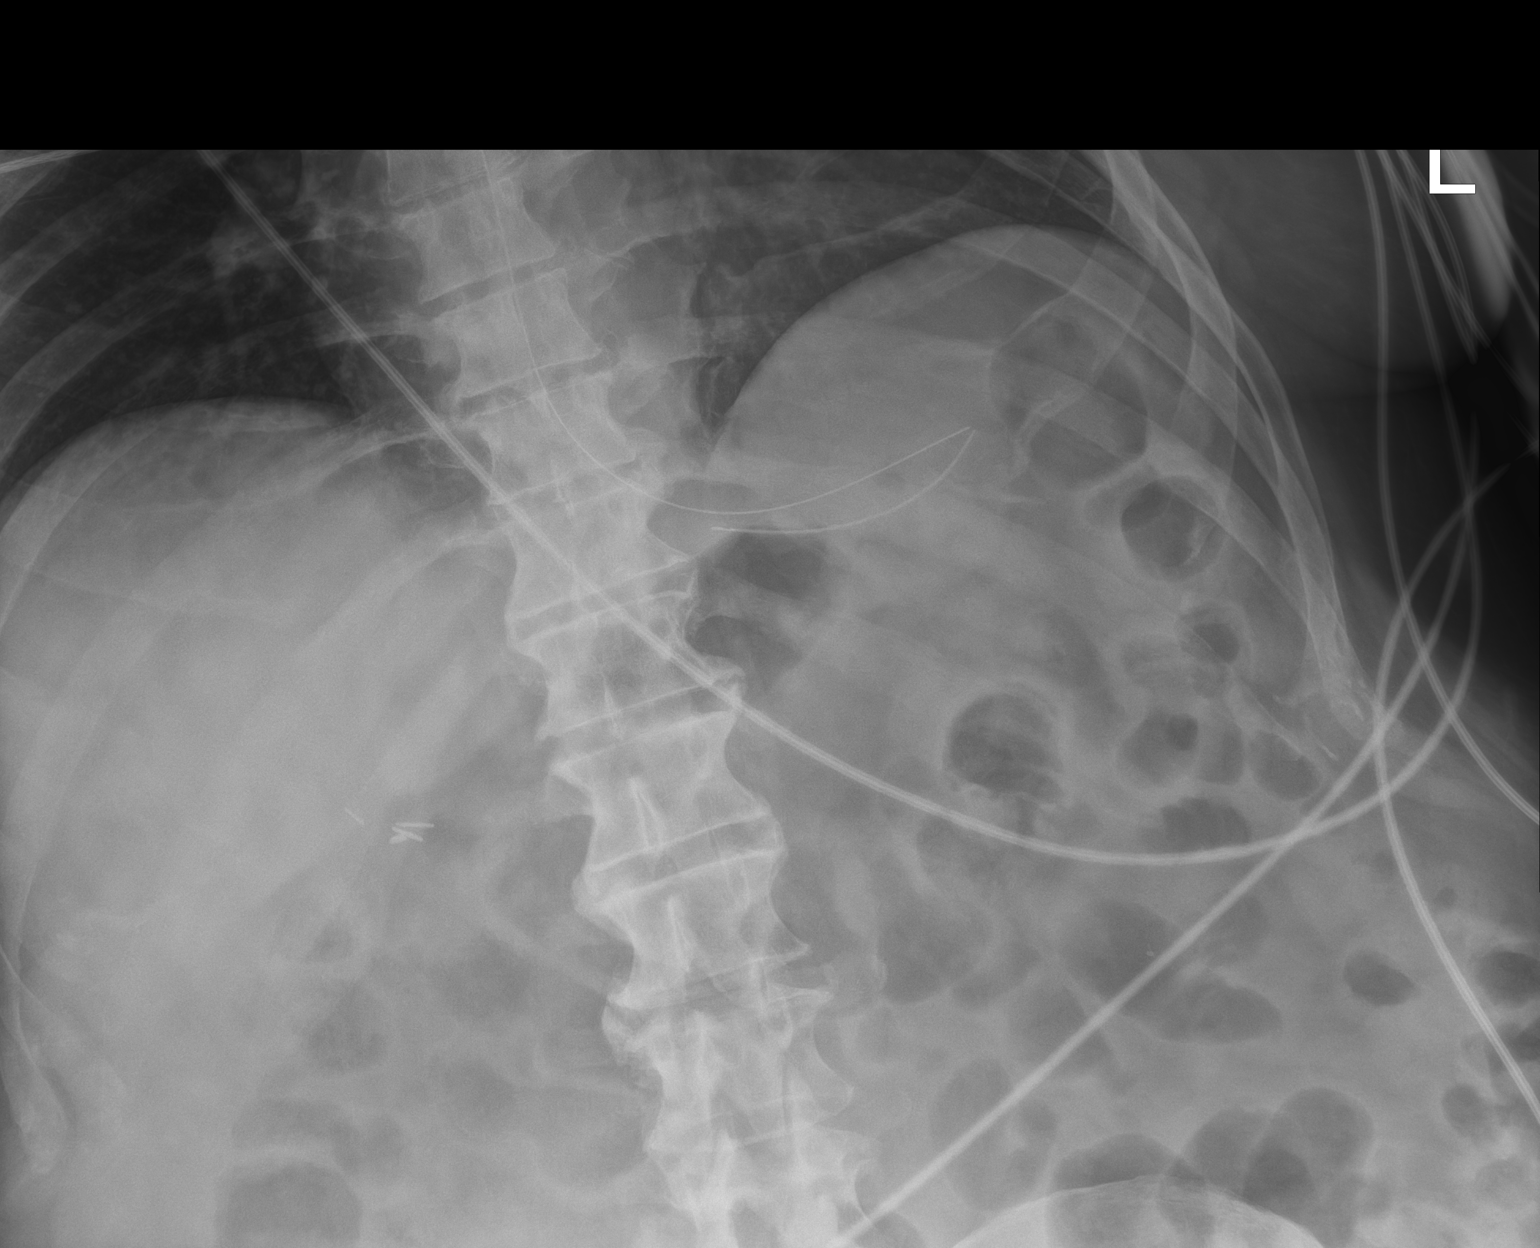

[2 of 2 positions shown; findings below may reference images not displayed]

FINDINGS: Nonspecific bowel gas pattern with mild gaseous distension of the
small and large bowel, similar to comparison. Gastric decompression
tube is kinked at its proximal side hole in the gastric fundus with
the tip oriented toward the gastroesophageal junction.
Cholecystectomy clips in the right upper quadrant. No acute osseous
abnormality.
IMPRESSION: Gastric decompression tube is kinked at its proximal side hole
within the gastric fundus. The tip of the catheter is oriented
toward the gastroesophageal junction. Continued advancement into the
stomach may improve function.

## 2021-05-11 IMAGING — DX DG CHEST 1V PORT
1 series · 1 of 1 positions shown · non-contrast
Comparison: [DATE]

CLINICAL DATA: Atelectasis

EXAM:
PORTABLE CHEST 1 VIEW

[chest]
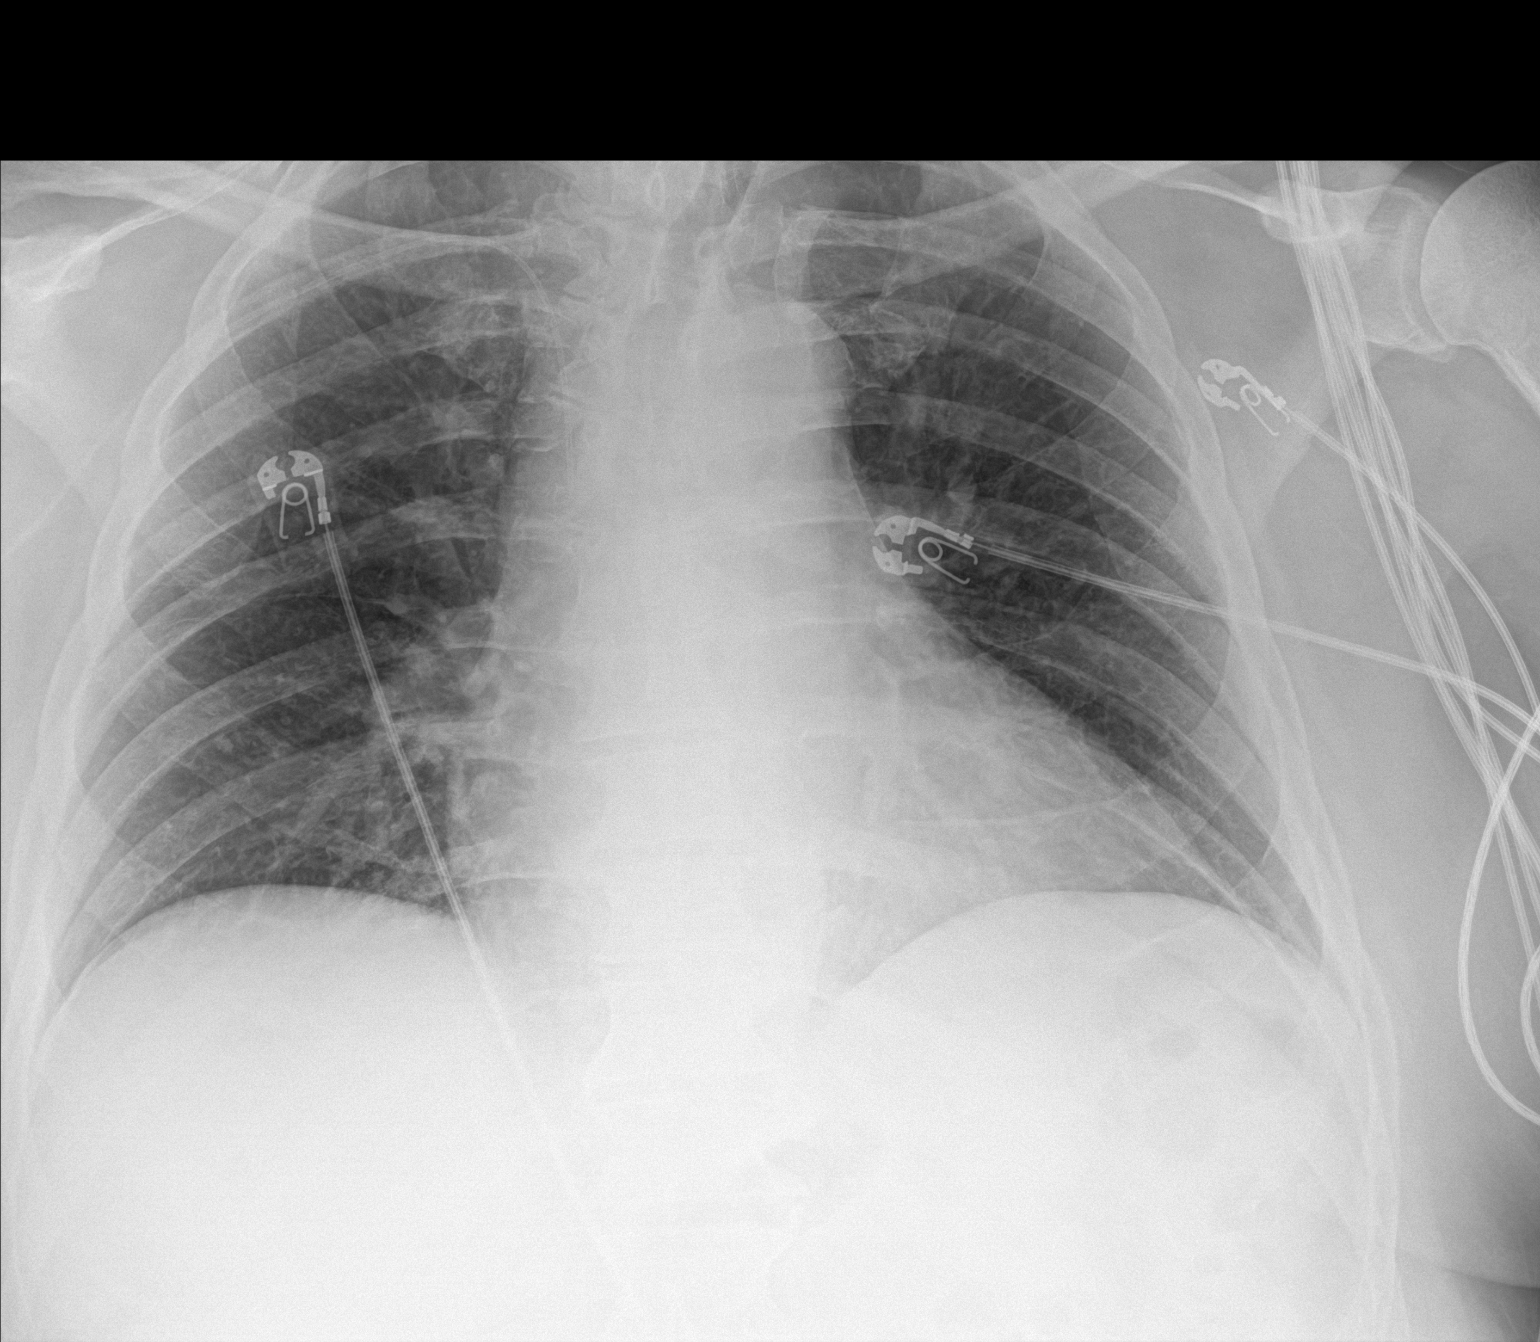

[1 of 1 positions shown; findings below may reference images not displayed]

FINDINGS: Lungs are clear.  No pleural effusion or pneumothorax.

The heart is top-normal in size.

Right arm PICC terminates in the mid SVC.

Surgical clips along the left neck/mediastinum.
IMPRESSION: No evidence of acute cardiopulmonary disease.

Right arm PICC terminates in the mid SVC.

## 2021-05-11 MED ORDER — VITAL HIGH PROTEIN PO LIQD
1000.0000 mL | ORAL | Status: DC
  Administered 2021-05-11: 1000 mL
  Filled 2021-05-11: qty 1000

## 2021-05-11 MED ORDER — AMIODARONE PEDIATRIC ORAL SUSPENSION 5 MG/ML
200.0000 mg | Freq: Two times a day (BID) | ORAL | Status: DC
  Filled 2021-05-11: qty 40

## 2021-05-11 MED ORDER — INSULIN ASPART 100 UNIT/ML IJ SOLN
0.0000 [IU] | Freq: Three times a day (TID) | INTRAMUSCULAR | Status: DC
  Administered 2021-05-12 – 2021-05-19 (×10): 3 [IU] via SUBCUTANEOUS

## 2021-05-11 MED ORDER — POTASSIUM CHLORIDE 10 MEQ/50ML IV SOLN
10.0000 meq | INTRAVENOUS | Status: AC
  Administered 2021-05-11 (×6): 10 meq via INTRAVENOUS
  Filled 2021-05-11 (×6): qty 50

## 2021-05-11 MED ORDER — DOXAZOSIN MESYLATE 2 MG PO TABS
2.0000 mg | ORAL_TABLET | Freq: Every day | ORAL | Status: DC
  Filled 2021-05-11: qty 1

## 2021-05-11 MED ORDER — AMIODARONE HCL 200 MG PO TABS
200.0000 mg | ORAL_TABLET | Freq: Two times a day (BID) | ORAL | Status: DC
  Administered 2021-05-11 – 2021-05-19 (×17): 200 mg
  Filled 2021-05-11 (×17): qty 1

## 2021-05-11 MED ORDER — DOXAZOSIN MESYLATE 2 MG PO TABS
2.0000 mg | ORAL_TABLET | Freq: Every day | ORAL | Status: DC
  Administered 2021-05-11 – 2021-05-19 (×10): 2 mg
  Filled 2021-05-11 (×10): qty 1

## 2021-05-11 NOTE — Progress Notes (Signed)
NAME:  Hayden Mcbride, MRN:  [id number], DOB:  1961/11/11, LOS: 11 ADMISSION DATE:  04/29/2021, CONSULTATION DATE:  05/11/21 REFERRING MD:  EDP, CHIEF COMPLAINT:  AMS   Brief summary:  60 y/o male with sudden onset of headache and hearing loss followed by AMS.  Recent return from 2 week trip to [place] early May.  Early CSF and MRI findings suggestive for bacterial meningitis.  ID consulted.    Pertinent  Medical History  GERD  Glaucoma  Colon Polyps  HTN  OSA  Thyroid Disease   Significant Hospital Events: Including procedures, antibiotic start and stop dates in addition to other pertinent events   . 5/17 presented to ED, intubated for agitation and airway protection. Ampicillin, vanc & rocephin started.  5/18 CSF culture and gram stain>> S. Pneumoniae >> pan sensitive . 5/18 PCCM consulted for admission, lumbar puncture completed, ID consulted.  EEG w/right hemispheric cortical dysfunction, no evidence of seizures.  Started on decadron; precedex off, remain on fentanyl gtt acyclovir started . 5/19 amp stopped . 5/20 vanc stopped . Penicillin G Continuous Infusion 5/21 >> . CT head 5/23 no evidence acute hemorrhage, infarct, extra-axial collection.  Some increased density dural venous sinuses, no clear evidence of clot . MRI/V 5/24 >> numerous foci of restricted diffusion around the subarachnoid spaces consistent with ongoing bacterial meningitis small amount restricted diffusion in the fourth ventricle but no evidence of wide spread ventriculitis, no evidence for venous thrombosis . 5/26 Slow improvement in neuro status, WBC 36, fever / cultures repeated, on precedex. vanc started . 5/27 Sputum culture positive for MRSA, losartan held with rise in cr. Extubated. Foley replaced after. vanc changed to zyvox off precedex . 528 required BIPAP early am hours. PANDA tube removed as coiled in back of throat     Interim History / Subjective:   Off oxygen. On amio  drip Afebrile  Objective   Blood pressure 134/74, pulse 64, temperature 98.1 F (36.7 C), resp. rate 14, height 5' 10.98" (1.803 m), weight 108.5 kg, SpO2 100 %.        Intake/Output Summary (Last 24 hours) at 05/11/2021 0857 Last data filed at 05/11/2021 0800 Gross per 24 hour  Intake 2617.29 ml  Output 2275 ml  Net 342.29 ml   Filed Weights   05/09/21 0352 05/10/21 0500 05/11/21 0500  Weight: 112.1 kg 109.7 kg 108.5 kg   Exam:  General acutely ill, sitting up in bed HEENT no pallor, icterus, no JVD Pulmonary: On room air, no accessory muscle use, weak cough, decreased breath sounds bilateral Cardiac: Regular rate and rhythm without murmur rub or gallop Abdomen obese soft not tender Extremities warm dry dependent edema brisk capillary refill Neuro awake, interactive although speech is thick, grossly nonfocal GU clear yellow urine Via Foley catheter  Labs/imaging that I havepersonally reviewed  (right click and "Reselect all SmartList Selections" daily)   Mild hypokalemia, decrease leukocytosis, sodium has normalized  Chest x-ray 5/29 independently reviewed, no new infiltrates, clear lungs  CT chest 5/28 shows right lower lobe consolidation, bilateral adrenal masses  Resolved Hospital Problem list   Lactic acidosis Erythrocytosis Bradycardia Severe Sepsis due to pneumococcal meningitis, onset at admission 5/18 Associated Septic shock, on pressors 5/18 - 5/19 Thrombocytopenia, likely due to sepsis  AKI - Vanc and arb stopped 5/27  Assessment & Plan:   Pneumococcal meningitis with encephalitis, encephalopathy MRSA PNA  Recurrent Fever -appreciate ID & Neurology assistance  Plan Penicillin G and Zyvox per ID Supportive care CIR  consult for deconditioning  Acute hypoxic respiratory failure Extubated 5/27, cough mechanics at this point are primary issue  Plan Aggressive pulmonary hygiene Incentive spirometry  Atrial fibrillation/flutter.  Suspect secondary  to stress state from his underlying illness.  Has been in and out.  Back to atrial fibrillation with stimulation on 5/24, 5/25. MRI/MRA reassuring, no evidence of venous sinus thrombosis Plan Cont tele , back in sinus rhythm 5/28 Cont amio for now , can convert to oral once NG obtained, plan short-term No ac give risk of ICH w/ meningitis   Dysphagia -placed small bore NG tube and start tube feeds   Hypernatremia, hypokalemia  Plan  Continue D5W IV potassium replacement  Leukocytosis-->improving  Plan  Am cbc  History of hypertension Plan Hold norvasc and lopressor , can resume once angio obtained   Urinary Retention  -foley reinserted 5/27 Plan Resume  doxazosin 2mg  either oral or Via tube 5/29 Another trial of Foley removal 5/30  Hyperglycemia Plan ssi   Bilateral adrenal mass 3.3 cm on left -radiology recommends MRI as outpatient   Best practice (right click and "Reselect all SmartList Selections" daily)  Diet:  NPO   going to keep tube feeding tube out for now, if mental status continues to improve may be able to trial p.o. tomorrow Pain/Anxiety/Delirium protocol (if indicated): No VAP protocol (if indicated): Not indicated DVT prophylaxis: SCD  GI prophylaxis: N/A Glucose control:  SSI Yes Central venous access:  N/A Arterial line:  N/A Foley:  Yes, and it is still needed;  Mobility:  OOB  PT consulted: N/A Last date of multidisciplinary goals of care discussion:  Code Status:  full code Disposition: Can transfer to floor   Kara Mead MD. FCCP. [facility] Pager : [phone]  If no response to pager , please call [phone] until 7 pm After 7:00 pm call Elink  [phone]   05/11/2021

## 2021-05-11 NOTE — Progress Notes (Signed)
Wife notified of patient's transfer to 2W20. No questions at this time.

## 2021-05-11 NOTE — Progress Notes (Signed)
AM K+ 3.2 with creat 0.70 and GFR > 60. CCM ELink electrolyte protocol initiated.

## 2021-05-11 NOTE — Progress Notes (Signed)
Patient transported to 2W20 with personal belongings. Attempted to notify wife twice at number listed with no response. Message requesting return call left with no personal information communicated over voicemail.

## 2021-05-11 NOTE — Progress Notes (Signed)
Inpatient Rehab Admissions Coordinator Note:   Per therapy recommendations, pt was screened for CIR candidacy by Takeem Krotzer, MS CCC-SLP. At this time, Pt. Appears to have functional decline and is a good candidate for CIR. Will request order for rehab consult per protocol.  Please contact me with questions.   Charolette Bultman, MS, CCC-SLP Rehab Admissions Coordinator  [phone] (celll) [phone] (office)  

## 2021-05-11 NOTE — Plan of Care (Signed)

## 2021-05-11 NOTE — Progress Notes (Signed)
Patient has no c/o shortness of breath at this time. Sp02=94% on room air. No bipap needed at this time.

## 2021-05-12 ENCOUNTER — Inpatient Hospital Stay (HOSPITAL_COMMUNITY): Payer: Commercial Managed Care - PPO

## 2021-05-12 DIAGNOSIS — J9601 Acute respiratory failure with hypoxia: Secondary | ICD-10-CM | POA: Diagnosis not present

## 2021-05-12 DIAGNOSIS — G001 Pneumococcal meningitis: Secondary | ICD-10-CM | POA: Diagnosis not present

## 2021-05-12 LAB — CBC
HCT: 41.6 % (ref 39.0–52.0)
Hemoglobin: 13.7 g/dL (ref 13.0–17.0)
MCH: 31.9 pg (ref 26.0–34.0)
MCHC: 32.9 g/dL (ref 30.0–36.0)
MCV: 96.7 fL (ref 80.0–100.0)
Platelets: 175 10*3/uL (ref 150–400)
RBC: 4.3 MIL/uL (ref 4.22–5.81)
RDW: 13.2 % (ref 11.5–15.5)
WBC: 16.8 10*3/uL — ABNORMAL HIGH (ref 4.0–10.5)
nRBC: 0 % (ref 0.0–0.2)

## 2021-05-12 LAB — GLUCOSE, CAPILLARY
Glucose-Capillary: 106 mg/dL — ABNORMAL HIGH (ref 70–99)
Glucose-Capillary: 102 mg/dL — ABNORMAL HIGH (ref 70–99)
Glucose-Capillary: 126 mg/dL — ABNORMAL HIGH (ref 70–99)
Glucose-Capillary: 112 mg/dL — ABNORMAL HIGH (ref 70–99)

## 2021-05-12 LAB — CULTURE, BLOOD (ROUTINE X 2)
Culture: NO GROWTH
Culture: NO GROWTH
Special Requests: ADEQUATE

## 2021-05-12 LAB — BASIC METABOLIC PANEL
Anion gap: 6 (ref 5–15)
BUN: 27 mg/dL — ABNORMAL HIGH (ref 8–23)
CO2: 29 mmol/L (ref 22–32)
Calcium: 8 mg/dL — ABNORMAL LOW (ref 8.9–10.3)
Chloride: 107 mmol/L (ref 98–111)
Creatinine, Ser: 0.76 mg/dL (ref 0.61–1.24)
GFR, Estimated: >60 mL/min (ref >60)
Glucose, Bld: 111 mg/dL — ABNORMAL HIGH (ref 70–99)
Potassium: 3.7 mmol/L (ref 3.5–5.1)
Sodium: 142 mmol/L (ref 135–145)

## 2021-05-12 LAB — MAGNESIUM: Magnesium: 2.1 mg/dL (ref 1.7–2.4)

## 2021-05-12 LAB — PHOSPHORUS: Phosphorus: 3.1 mg/dL (ref 2.5–4.6)

## 2021-05-12 IMAGING — DX DG CHEST 1V PORT
1 series · 1 of 1 positions shown · non-contrast
Comparison: Chest radiograph dated [DATE].

CLINICAL DATA: 60-year-old male with NG coming up.

EXAM:
PORTABLE CHEST 1 VIEW

[chest ap]
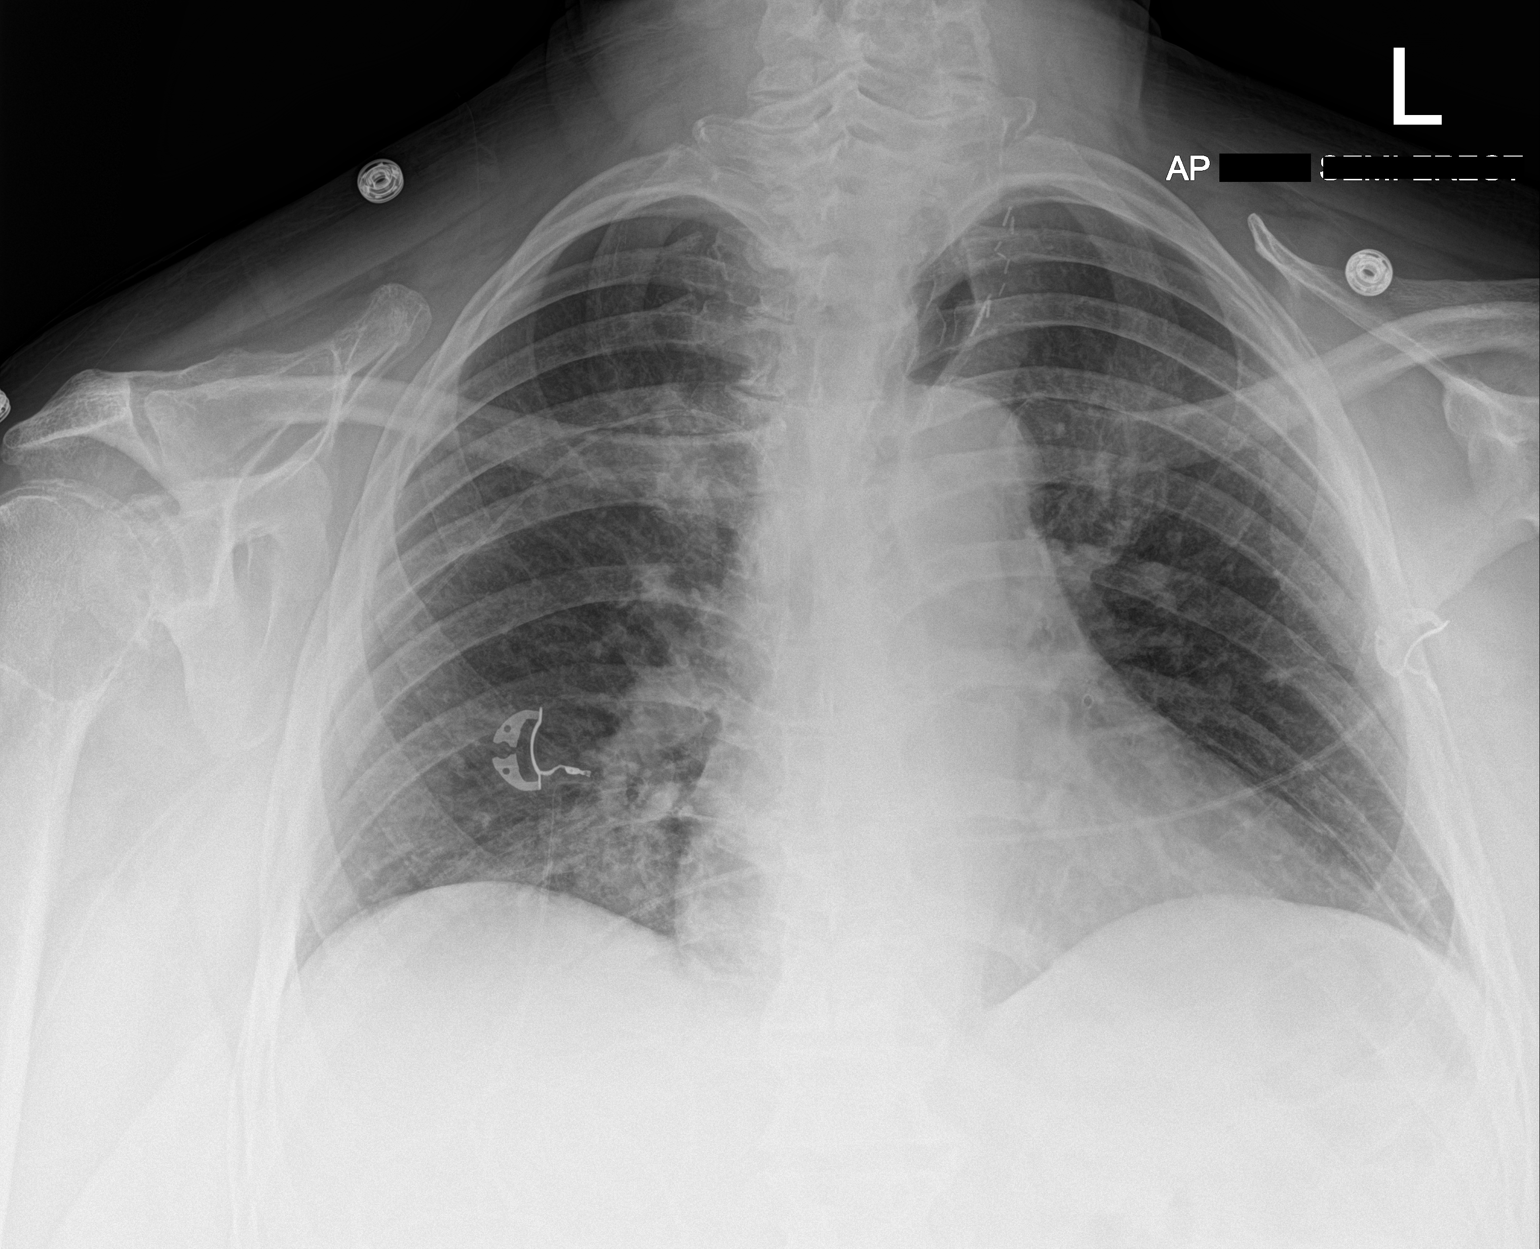

[1 of 1 positions shown; findings below may reference images not displayed]

FINDINGS: No enteric tube identified. Right-sided PICC with tip close to the
cavoatrial junction. Faint bilateral densities, likely atelectasis.
Atypical infection is not excluded. No focal consolidation, pleural
effusion, or pneumothorax. Stable cardiac silhouette. No acute
osseous pathology. Postsurgical changes of left thyroidectomy. There
is deviation of the upper trachea to the left secondary to right
thyroid nodule seen on the CT of [DATE].
IMPRESSION: 1. No enteric tube identified.
2. Faint bilateral pulmonary densities.

## 2021-05-12 MED ORDER — FREE WATER
250.0000 mL | Freq: Four times a day (QID) | Status: DC
  Administered 2021-05-13 – 2021-05-19 (×25): 250 mL

## 2021-05-12 MED ORDER — PROSOURCE TF PO LIQD
45.0000 mL | Freq: Four times a day (QID) | ORAL | Status: DC
  Administered 2021-05-13 – 2021-05-19 (×22): 45 mL
  Filled 2021-05-12 (×21): qty 45

## 2021-05-12 MED ORDER — VITAL 1.5 CAL PO LIQD
1000.0000 mL | ORAL | Status: DC
  Administered 2021-05-13 – 2021-05-19 (×6): 1000 mL
  Filled 2021-05-12 (×10): qty 1000

## 2021-05-12 NOTE — Consult Note (Signed)
Physical Medicine and Rehabilitation Consult   Reason for Consult: Functional deficits due to meningitis Referring Physician: Dr. Antony Haste   HPI: Hayden Mcbride is a 60 y.o. RH-male with history of HTN, OSA, glaucoma, right hearing loss, reports of recent visit to [place] who was admitted on 04/29/2020 with mental status changes nonsensical speech, lethargy, headaches, right gaze preference and became progressively agitated in ED requiring intubation for airway protection.  MRI brain done showing diffuse leptomeningeal enhancement and abnormal FLAIR signal throughout subarachnoid spaces compatible with acute meningitis as well as punctate DWI foci along midline parieto-occipital sulci with question of trace purulence.  CTA head/neck was negative for LVO or high-grade stenosis and showed incidental 4.7 cm right thyroid nodule. LP done showing marked xanthochromia with elevated opening pressure consistent with encephalitis and he was started on acyclovir as well as broad-spectrum antibiotics.  CSF revealed neutrophilic pleocytosis with WBC 585, protein>600, glucose<20  and gram stain showing few Streptococcus pneumonia.  Blood cultures x2 negative.  EEG without evidence of seizures.  Antibiotics narrowed to high-dose penicillin per ID input.  He continued to have lethargy with decrease in movement, disconjugate gaze as well as decrease in ability to follow commands.  He was noted to have decreasing movement on LLE.  Neurology recommended repeat CT head 5/23 which showed increased density in some of dural venous sinuses question hemoconcentration versus thrombus.  MRI/MRV brain repeated showing changes consistent with bacterial meningitis and increase in numerous foci of restricted diffusion about subarachnoid spaces with areas of septation and synechiae formation consistent with bacterial meningitis, no evidence of bacterial ventriculitis or venous thrombosis.  He developed fevers with leukocytosis and  sputum cultures positive for MRSA.  He was started on linezolid and tolerated extubation to BiPAP by 05/27.  CT chest on 05/28 showing consolidation at the posterior base right lower lobe consistent with pneumonia as well as incidental bilateral adrenal masses with recommendations of adrenal MRI for further assessment. 2D echo done showing EF 65 to 70% with no valve abnormality.  He did develop A. fib/a flutter felt to be due to stress of underlying illness and was started on amiodarone for rate control.  On core track for tube feeds and electrolyte abnormalities treated with D5W as well as potassium/magnesium replacement.  Therapy evaluations completed this weekend revealing pharyngeal dysphagia with thick secretions and difficulty initiating swallow, cognitive deficits with delay in processing and generalized weakness BUE>BLE with bilateral knee instability affecting mobility and ADLs.    CIR was recommended due to functional decline   Review of Systems  Constitutional: Negative for chills, fever and weight loss.  HENT: Negative for congestion and nosebleeds.   Eyes: Negative for discharge and redness.  Respiratory: Negative for cough, shortness of breath and stridor.   Cardiovascular: Negative for chest pain and leg swelling.  Gastrointestinal: Negative for abdominal pain, nausea and vomiting.  Genitourinary: Positive for frequency.  Musculoskeletal: Negative.   Skin: Negative for itching.  Neurological: Positive for weakness. Negative for headaches.  Psychiatric/Behavioral: Positive for memory loss. Negative for hallucinations.      Past Medical History:  Diagnosis Date  . Allergy   . Dry eyes   . GERD (gastroesophageal reflux disease)   . Glaucoma   . Hx of adenomatous colonic polyps 07/08/2006   06/2006 - diminutive adenoma 04/19/2015 - diminutive adenoma and one polyp lost - repeat colonoscopy 2021  . Hypertension   . Post-operative nausea and vomiting   . Sleep apnea  wears CPAP   . Thyroid disease    long ago- half thyroid removed ~20 yrs ago     Past Surgical History:  Procedure Laterality Date  . BREATH TEK H PYLORI N/A 12/24/2014   Procedure: BREATH TEK H PYLORI;  Surgeon: Alphonsa Overall, MD;  Location: [facility];  Service: General;  Laterality: N/A;  . CHOLECYSTECTOMY    . COLONOSCOPY    . EYE SURGERY     age 23   . 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 POLYPECTOMY    . SHOULDER SURGERY     left shoulder  . THYROID SURGERY      Family History  Problem Relation Age of Onset  . Diverticulitis Mother   . Colon cancer Father        in his 10's dx'd   . Cancer Maternal Grandmother   . Esophageal cancer Neg Hx   . Stomach cancer Neg Hx   . Rectal cancer Neg Hx   . 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  Reports that he quit smoking about 36 years ago. His smoking use included cigarettes. He quit after 10.00 years of use. He has never used smokeless tobacco. He reports current alcohol use of about 2.0 standard drinks of alcohol per week. He reports that he does not use drugs.   Allergies  Allergen Reactions  . Demerol [Meperidine]     Large doses cause nausea and vomiting    Medications Prior to Admission  Medication Sig Dispense Refill  . amLODipine (NORVASC) 5 MG tablet Take 5 mg by mouth daily.    Marland Kitchen ibuprofen (ADVIL) 200 MG tablet Take 200-400 mg by mouth every 6 (six) hours as needed for headache or moderate pain.    Marland Kitchen losartan (COZAAR) 100 MG tablet Take 100 mg by mouth daily.    . Multiple Vitamins-Minerals (ONE-A-DAY MENS 50+) TABS Take 1 tablet by mouth daily.    Marland Kitchen omeprazole (PRILOSEC) 20 MG capsule Take 20 mg by mouth daily.       Home: Home Living Family/patient expects to be discharged to:: Private residence Living Arrangements: Spouse/significant other Available Help at Discharge: Family,Available PRN/intermittently Type of Home: House Home Access: Stairs  to enter [employer] of Steps: 3 Entrance Stairs-Rails: Right Home Layout: One level Bathroom Shower/Tub: Tub/shower Psychologist, occupational: Standard Home Equipment: Shower seat - built in  Functional History: Prior Function Level of Independence: Independent Comments: Pt was fully independent.  He works as a Corporate investment banker.  He enjoys playing golf Functional Status:  Mobility: Bed Mobility Overal bed mobility: Needs Assistance Bed Mobility: Supine to Sit Supine to sit: Mod assist,+2 for safety/equipment,HOB elevated General bed mobility comments: cues for sequencing.  He required assist to initiate movement and assist to move LEs off the bed and to lift trunk Transfers Overall transfer level: Needs assistance Equipment used: 2 person hand held assist Transfers: Sit to/from [place] to Stand: Max assist,+2 physical assistance,+2 safety/equipment Stand pivot transfers: Mod assist,+2 physical assistance,+2 safety/equipment General transfer comment: Pt attempted to stand x 2 with mod A +2 progressing to max A +2 but unable to fully extend hips and trunk.  He transferred to the recliner with mod A +2 to lift buttocks, for balance, and to pivot feet. Knees buckling partially Ambulation/Gait General Gait Details: unable to step feet safely    ADL: ADL Overall  ADL's : Needs assistance/impaired Eating/Feeding: NPO Grooming: Wash/dry hands,Wash/dry face,Moderate assistance,Sitting Upper Body Bathing: Maximal assistance,Sitting Lower Body Bathing: Maximal assistance,Sit to/from stand,Sitting/lateral leans Upper Body Dressing : Maximal assistance,Sitting Lower Body Dressing: Total assistance,Sit to/from stand,Sitting/lateral leans Toilet Transfer: Moderate assistance,+2 for physical assistance,+2 for safety/equipment,Squat-pivot,BSC Toileting- Clothing Manipulation and Hygiene: Total assistance,Sitting/lateral lean,Sit to/from  stand Functional mobility during ADLs: Moderate assistance,+2 for physical assistance,+2 for safety/equipment (squat pivot transfer)  Cognition: Cognition Overall Cognitive Status: Impaired/Different from baseline Orientation Level: Oriented to person,Oriented to place,Oriented to situation,Disoriented to time Cognition Arousal/Alertness: Awake/alert Behavior During Therapy: Flat affect Overall Cognitive Status: Impaired/Different from baseline Area of Impairment: Attention,Following commands,Problem solving,Orientation Orientation Level: Situation Current Attention Level: Sustained Following Commands: Follows one step commands consistently,Follows one step commands inconsistently Problem Solving: Slow processing,Decreased initiation,Requires verbal cues General Comments: Pt oriented to time, hospital, but not fully oriented to situation.  He follows commands consistently, but is slow to process info.  Speech is difficult to understand.   Blood pressure 140/75, pulse 67, temperature 98.8 F (37.1 C), temperature source Axillary, resp. rate 20, height 5' 10.98" (1.803 m), weight 108.5 kg, SpO2 96 %. Physical Exam Vitals and nursing note reviewed.  Constitutional:      Appearance: He is obese.  HENT:     Head: Normocephalic and atraumatic.     Mouth/Throat:     Mouth: Mucous membranes are dry.     Pharynx: No posterior oropharyngeal erythema.  Eyes:     Extraocular Movements: Extraocular movements intact.  Cardiovascular:     Rate and Rhythm: Normal rate and regular rhythm.     Pulses: Normal pulses.     Heart sounds: Normal heart sounds. No murmur heard.   Pulmonary:     Effort: Pulmonary effort is normal. No respiratory distress.     Breath sounds: Normal breath sounds.  Abdominal:     General: Abdomen is flat. Bowel sounds are normal.     Palpations: Abdomen is soft.  Musculoskeletal:     Cervical back: No rigidity.  Neurological:     Mental Status: He is alert. He is  disoriented.     Cranial Nerves: Dysarthria present.     Gait: Gait abnormal.     Comments: Oriented to person and place  Strength is 4/5 bilateral deltoid bicep tricep grip 4 - at bilateral hip flexor knee extensor ankle dorsiflexor Sensation intact bilaterally in upper and lower extremities.  Psychiatric:        Mood and Affect: Affect is blunt.        Speech: Speech is delayed.     Results for orders placed or performed during the hospital encounter of 04/29/21 (from the past 24 hour(s))  Glucose, capillary     Status: Abnormal   Collection Time: 05/11/21 11:17 AM  Result Value Ref Range   Glucose-Capillary 136 (H) 70 - 99 mg/dL  Glucose, capillary     Status: Abnormal   Collection Time: 05/11/21  3:40 PM  Result Value Ref Range   Glucose-Capillary 102 (H) 70 - 99 mg/dL  Glucose, capillary     Status: Abnormal   Collection Time: 05/11/21  7:51 PM  Result Value Ref Range   Glucose-Capillary 121 (H) 70 - 99 mg/dL  Glucose, capillary     Status: Abnormal   Collection Time: 05/11/21 11:34 PM  Result Value Ref Range   Glucose-Capillary 125 (H) 70 - 99 mg/dL   Comment 1 Notify RN   CBC     Status: Abnormal  Collection Time: 05/12/21 12:59 AM  Result Value Ref Range   WBC 16.8 (H) 4.0 - 10.5 K/uL   RBC 4.30 4.22 - 5.81 MIL/uL   Hemoglobin 13.7 13.0 - 17.0 g/dL   HCT 41.6 39.0 - 52.0 %   MCV 96.7 80.0 - 100.0 fL   MCH 31.9 26.0 - 34.0 pg   MCHC 32.9 30.0 - 36.0 g/dL   RDW 13.2 11.5 - 15.5 %   Platelets 175 150 - 400 K/uL   nRBC 0.0 0.0 - 0.2 %  Basic metabolic panel     Status: Abnormal   Collection Time: 05/12/21 12:59 AM  Result Value Ref Range   Sodium 142 135 - 145 mmol/L   Potassium 3.7 3.5 - 5.1 mmol/L   Chloride 107 98 - 111 mmol/L   CO2 29 22 - 32 mmol/L   Glucose, Bld 111 (H) 70 - 99 mg/dL   BUN 27 (H) 8 - 23 mg/dL   Creatinine, Ser 0.76 0.61 - 1.24 mg/dL   Calcium 8.0 (L) 8.9 - 10.3 mg/dL   GFR, Estimated >60 >60 mL/min   Anion gap 6 5 - 15  Magnesium      Status: None   Collection Time: 05/12/21 12:59 AM  Result Value Ref Range   Magnesium 2.1 1.7 - 2.4 mg/dL  Phosphorus     Status: None   Collection Time: 05/12/21 12:59 AM  Result Value Ref Range   Phosphorus 3.1 2.5 - 4.6 mg/dL  Glucose, capillary     Status: Abnormal   Collection Time: 05/12/21  3:07 AM  Result Value Ref Range   Glucose-Capillary 106 (H) 70 - 99 mg/dL  Glucose, capillary     Status: Abnormal   Collection Time: 05/12/21  7:20 AM  Result Value Ref Range   Glucose-Capillary 102 (H) 70 - 99 mg/dL   DG Abd 1 View  Result Date: 05/11/2021 CLINICAL DATA:  60 year old male status post nasogastric tube placement. EXAM: ABDOMEN - 1 VIEW COMPARISON:  05/09/2021 FINDINGS: Nonspecific bowel gas pattern with mild gaseous distension of the small and large bowel, similar to comparison. Gastric decompression tube is kinked at its proximal side hole in the gastric fundus with the tip oriented toward the gastroesophageal junction. Cholecystectomy clips in the right upper quadrant. No acute osseous abnormality. IMPRESSION: Gastric decompression tube is kinked at its proximal side hole within the gastric fundus. The tip of the catheter is oriented toward the gastroesophageal junction. Continued advancement into the stomach may improve function. Electronically Signed   By: Ruthann Cancer MD   On: 05/11/2021 10:26   CT CHEST WO CONTRAST  Result Date: 05/10/2021 CLINICAL DATA:  Fever of unknown origin. EXAM: CT CHEST WITHOUT CONTRAST TECHNIQUE: Multidetector CT imaging of the chest was performed following the standard protocol without IV contrast. COMPARISON:  Chest radiograph, 05/09/2021 and older studies. FINDINGS: Cardiovascular: Normal sized heart. No pericardial effusion. No coronary artery calcifications. Great vessels normal in caliber. No aortic atherosclerosis. Mediastinum/Nodes: Previous left thyroidectomy. Right thyroid lobe is enlarged and heterogeneous consistent with nodules. Either  combined nodules or 1 large nodule spans 4.2 cm. No mediastinal or hilar masses or enlarged lymph nodes. Trachea and esophagus are unremarkable. Lungs/Pleura: There is consolidation in the posterior base of the right lower lobe consistent with pneumonia. There is minor atelectasis in the dependent left lower lobe. Focus of calcification in the right middle lobe. Mild scarring at the right apex. Remainder of the lungs is clear. No pleural effusion or pneumothorax. Upper  Abdomen: No acute findings. 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 Left adrenal mass, 3.3 cm, average Hounsfield units of 54, nonspecific. Status post cholecystectomy. Musculoskeletal: No fracture or acute finding. No aggressive bone lesion. IMPRESSION: 1. Consolidation in the posterior base of the right lower lobe consistent with pneumonia. 2. No other acute abnormality. 3. Bilateral adrenal masses, the left measuring 3.3 cm, indeterminate, not meeting criteria for an adenoma on unenhanced imaging. Recommend follow-up adrenal MRI for further assessment. Right adrenal myelolipoma, benign. Electronically Signed   By: Lajean Manes M.D.   On: 05/10/2021 10:53   DG Chest Port 1 View  Result Date: 05/11/2021 CLINICAL DATA:  Atelectasis EXAM: PORTABLE CHEST 1 VIEW COMPARISON:  05/09/2021 FINDINGS: Lungs are clear.  No pleural effusion or pneumothorax. The heart is top-normal in size. Right arm PICC terminates in the mid SVC. Surgical clips along the left neck/mediastinum. IMPRESSION: No evidence of acute cardiopulmonary disease. Right arm PICC terminates in the mid SVC. Electronically Signed   By: Julian Hy M.D.   On: 05/11/2021 08:17     Assessment/Plan: Diagnosis: Meningitis 1. Does the need for close, 24 hr/day medical supervision in concert with the patient's rehab needs make it unreasonable for this patient to be served in a less intensive setting? Yes 2. Co-Morbidities requiring supervision/potential complications: Hypertension right  lower lobe pneumonia, atrial fibrillation and flutter 3. Due to bladder management, bowel management, safety, skin/wound care, disease management, medication administration, pain management and patient education, does the patient require 24 hr/day rehab nursing? Yes 4. Does the patient require coordinated care of a physician, rehab nurse, therapy disciplines of PT, OT, speech to address physical and functional deficits in the context of the above medical diagnosis(es)? Yes Addressing deficits in the following areas: balance, endurance, locomotion, strength, transferring, bowel/bladder control, bathing, dressing, feeding, grooming, toileting, cognition, swallowing and psychosocial support 5. Can the patient actively participate in an intensive therapy program of at least 3 hrs of therapy per day at least 5 days per week? Yes 6. The potential for patient to make measurable gains while on inpatient rehab is good 7. Anticipated functional outcomes upon discharge from inpatient rehab are min assist  with PT, min assist with OT, supervision with SLP. 8. Estimated rehab length of stay to reach the above functional goals is: 21 to 25 days 9. Anticipated discharge destination: Home 10. Overall Rehab/Functional Prognosis: good  RECOMMENDATIONS: This patient's condition is appropriate for continued rehabilitative care in the following setting: CIR Patient has agreed to participate in recommended program. Yes Note that insurance prior authorization may be required for reimbursement for recommended care.  Comment: Need to establish caregiver availability and capability   Bary Leriche, PA-C 05/12/2021   "I have personally performed a face to face diagnostic evaluation of this patient.  Additionally, I have reviewed and concur with the physician assistant's documentation above." Charlett Blake M.D. [facility] Fellow Am Acad of Phys Med and Rehab Diplomate Am Board of Electrodiagnostic  Med Fellow Am Board of Interventional Pain

## 2021-05-12 NOTE — Progress Notes (Signed)
Patient assisted back to bed.

## 2021-05-12 NOTE — Progress Notes (Signed)
Inpatient Rehab Admissions Coordinator:   I spoke with Pt.'s wife over the phone regarding potential CIR admission. She stated that she is interested in CIR for Pt. And that she works from home and can provide some support at d/c. Pt. Does not appear medically read for CIR at this time, but I will follow and consider for CIR admission pending medical readiness, bed availability, and insurance auth.   Clemens Catholic, [place], [place] Admissions Coordinator  [phone] ([place]) [phone] (office)

## 2021-05-12 NOTE — Progress Notes (Signed)
Physical Therapy Treatment Patient Details Name: Hayden Mcbride MRN: [id number] DOB: 09-04-1961 Today's Date: 05/12/2021    History of Present Illness Pt is 60 yo male who presented on 04/29/21 with HA and ear pain became combative with slurred speech and intubated 5/18 > extubated 5/27. Found to have bacterial meningitis. PMH: HTN, GERD, OSA on CPAP, thyroid disease, colonic polyps.    PT Comments    Pt making good progress today.  He was able to transfer with mod A and ambulated 6' with Mod A and close chair follow.  Pt's legs remain weak and fatigues very easily.  VSS throughout session.    Follow Up Recommendations  CIR;Supervision/Assistance - 24 hour     Equipment Recommendations  Rolling walker with 5" wheels;Wheelchair cushion (measurements PT);Wheelchair (measurements PT);3in1 (PT) (needs further assessment)    Recommendations for Other Services       Precautions / Restrictions Precautions Precautions: Fall Precaution Comments: generalized weakness, delayed processing    Mobility  Bed Mobility Overal bed mobility: Needs Assistance Bed Mobility: Supine to Sit     Supine to sit: Mod assist;HOB elevated     General bed mobility comments: Cues for sequencing/initiation ; increased time; pt requiring assist for legs and trunk but was able to scoot self to edge of bed with cues    Transfers Overall transfer level: Needs assistance Equipment used: Rolling walker (2 wheeled) Transfers: Sit to/from Stand Sit to Stand: Mod assist;Max assist         General transfer comment: Mod A to rise from elevated bed and max A from low chair; cues for hand placement; cues and assist for controlled descent  Ambulation/Gait Ambulation/Gait assistance: Mod assist;+2 safety/equipment Gait Distance (Feet): 6 Feet (2' to chair then 6' forward) Assistive device: Rolling walker (2 wheeled) Gait Pattern/deviations: Step-to pattern;Decreased stride length;Shuffle Gait velocity:  decreased   General Gait Details: Pt's legs/knees very shakey but not buckling.  Cues for steps, posture, and RW.  Had very close chair follow.  Cues for rest breaks   Stairs             Wheelchair Mobility    Modified Rankin (Stroke Patients Only)       Balance Overall balance assessment: Needs assistance Sitting-balance support: Feet supported;No upper extremity supported Sitting balance-Leahy Scale: Fair     Standing balance support: Bilateral upper extremity supported Standing balance-Leahy Scale: Poor Standing balance comment: requiring RW and min A                            Cognition Arousal/Alertness: Awake/alert Behavior During Therapy: Flat affect Overall Cognitive Status: Impaired/Different from baseline Area of Impairment: Safety/judgement                 Orientation Level: Situation Current Attention Level: Sustained Memory: Decreased short-term memory Following Commands: Follows one step commands consistently Safety/Judgement: Decreased awareness of safety   Problem Solving: Slow processing;Decreased initiation;Requires verbal cues General Comments: Slow to process information and speech difficult to understand at times. Cueing for rest breaks due to visible fatigue. Pt is also HOH.      Exercises      General Comments General comments (skin integrity, edema, etc.): VSS on RA.  RN had reported Cortrak feeding stopped due to suspected incorrect position.  Did note pt with auidable crackling with breathing that wife reports is new-suspected due to possible incorrect position cortrak.      Pertinent Vitals/Pain Pain Assessment:  No/denies pain    Home Living                      Prior Function            PT Goals (current goals can now be found in the care plan section) Acute Rehab PT Goals Patient Stated Goal: "I want to go home" PT Goal Formulation: With patient Time For Goal Achievement: 05/24/21 Potential to  Achieve Goals: Good Progress towards PT goals: Progressing toward goals    Frequency    Min 3X/week      PT Plan Current plan remains appropriate    Co-evaluation              AM-PAC PT "6 Clicks" Mobility   Outcome Measure  Help needed turning from your back to your side while in a flat bed without using bedrails?: A Little Help needed moving from lying on your back to sitting on the side of a flat bed without using bedrails?: A Lot Help needed moving to and from a bed to a chair (including a wheelchair)?: A Lot Help needed standing up from a chair using your arms (e.g., wheelchair or bedside chair)?: A Lot Help needed to walk in hospital room?: A Lot Help needed climbing 3-5 steps with a railing? : Total 6 Click Score: 12    End of Session Equipment Utilized During Treatment: Gait belt Activity Tolerance: Patient tolerated treatment well Patient left: in chair;with call bell/phone within reach;with chair alarm set;with family/visitor present Nurse Communication: Mobility status;Need for lift equipment (STEDY of mod x 2 back to bed) PT Visit Diagnosis: Muscle weakness (generalized) (M62.81);Difficulty in walking, not elsewhere classified (R26.2);Unsteadiness on feet (R26.81)     Time: 1206-1230 PT Time Calculation (min) (ACUTE ONLY): 24 min  Charges:  $Gait Training: 8-22 mins $Therapeutic Activity: 8-22 mins                     Abran Richard, PT Acute Rehab Services Pager [phone] [facility] 05/12/2021, 1:37 PM

## 2021-05-12 NOTE — Progress Notes (Signed)
MD made aware of nasogastric tube coming out. Per MD, it was OK to remain out.

## 2021-05-12 NOTE — Progress Notes (Signed)
Brief Follow-up:  RN notified RD that pt coughing up secretions which appeared and smelled like TF. TF now on hold. Per RN, HOB not >30 degrees on arrival which may have contributed to this.   RD noted abd xray from 5/29 with NG in stomach but tip turned towards GE junction with advancement recommended. Per RN, tube placement confirmed via checking the "mark" on tube with no change. No cm measurement located in chart.   Recommend considering abd xray to confirm NG placement prior to resuming today. If TF resume, HOB must remain >30 degrees at all times during feedings. Plan for possible Cortrak tomorrow pending SLP evaluation  Kerman Passey MS, RDN, LDN, CNSC Registered Dietitian III Clinical Nutrition RD Pager and On-Call Pager Number Located in [place]

## 2021-05-12 NOTE — Progress Notes (Signed)
  Speech Language Pathology Treatment: Dysphagia  Patient Details Name: Hayden Mcbride MRN: [id number] DOB: April 03, 1961 Today's Date: 05/12/2021 Time: 3235-5732 SLP Time Calculation (min) (ACUTE ONLY): 12 min  Assessment / Plan / Recommendation Clinical Impression  Pt seen with small bore NG tube in place, though tube not currently functioning per RN. Pt demonstrates improved vocal quality but required cueing and suction to clear pharyngeal secretions prior to assessment. Coughing still present with ice chips, but pt tolerated sips of honey thick liquids and puree with multiple swallows and dry vocal quality. At end of session pt had prolonged hard coughing. For now, keep NPO except for meds whole in puree. SLP will f/u with FEES tomorrow and report findings to [facility] team depending on result.   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 Recent return from 2 week trip to [place] early May.  Pt has persistent encephalopathy, slowly improving. ETT 517-5/27.  CXR 5/27 concerning for possible RLL pna.  MRI 5/24 c/w bacterial meningitis.  Prior hx: GERD, Glaucoma, Colon Polyps, HTN, OSA, Thyroid Disease      SLP Plan  Continue with current plan of care       Recommendations  Diet recommendations: NPO;Other(comment) (except for meds in puree) Medication Administration: Whole meds with puree                Oral Care Recommendations: Oral care QID Follow up Recommendations: Inpatient Rehab Plan: Continue with current plan of care       GO               Herbie Baltimore, [place] Pager [phone] Office [phone]  Lynann Beaver 05/12/2021, 12:25 PM

## 2021-05-12 NOTE — Progress Notes (Addendum)
PROGRESS NOTE  Hayden Mcbride MGQ:[id number] DOB: 06/01/1961 DOA: 04/29/2021 PCP: Robyne Peers, MD   LOS: 12 days   Brief Narrative / Interim history: 60 year old male with hypertension, OSA, unspecified thyroid disease, comes into the hospital and is admitted on 5/17 due to sudden onset of headache, hearing loss and combativeness/confusion.  He recently returned from a 2-week trip to [place].  He was intubated in the ED and was admitted to the ICU.  Further work-up with an LP showed evidence of having pneumococcal meningitis.  ID consulted.  He was eventually extubated and transferred to the hospitalist service on 5/30.  Significant events 5/17 presented to ED, intubated for agitation and airway protection. Ampicillin, vanc & rocephin started.  5/18 CSF culture and gram stain>> S. Pneumoniae >> pan sensitive 5/18 EEG w/right hemispheric cortical dysfunction, no evidence of seizures. 5/19 amp stopped 5/20 vanc stopped 5/21 Penicillin G Continuous Infusion 5/21 >> 5/23 CT head no evidence acute hemorrhage, infarct, extra-axial collection. Some increased density dural venous sinuses, no clear evidence of clot MRI/V 5/24 >> numerous foci of restricted diffusion around the subarachnoid spaces consistent with ongoing bacterial meningitis small amount restricted diffusion in the fourth ventricle but no evidence of wide spread ventriculitis, no evidence for venous thrombosis 5/26 Slow improvement in neuro status, WBC 36, fever / cultures repeated, on precedex. vanc started 5/27 Sputum culture positive for MRSA, losartan held with rise in cr. Extubated. 528 required BIPAP early am hours 5/30 transfer to [facility]   Subjective / 24h Interval events: Seems confused this morning, alert to place, self, but not situation or time.  He remembers he went to [place] but is not sure why he is here.  Has mittens on.  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  Continue high-dose penicillin, ID following, appreciate input  Active Problems MRSA pneumonia-he was having fevers, cultured and tracheal aspirate cultures on 5/25 showed MRSA.  He was initially on vancomycin but transition to Zyvox because of an elevation in creatinine  Acute hypoxic respiratory failure, ventilator dependent respiratory failure -improving, on nasal cannula.  Paroxysmal A. fib-apparently had an episode of A. fib and he was placed on amiodarone. Currently he is in sinus rhythm.  He has not been anticoagulated due to bacterial meningitis and high risk of ICH.  His A. fib is likely in the setting of acute illness, if he remains in sinus and does not have recurrence can probably avoid anticoagulation altogether.  His CHA2DS2-VASc score is 1 for hypertension.  2D echo done on 5/27 showed an EF of 65-70%, normal LVEF without WMA, normal RV.  Acute toxic encephalopathy -due to #1, supportive care.  Still has an NG tube, speech to see  Thyroid disease-mentioned in the chart however TSH unremarkable.  Acute kidney injury-on 5/27 creatinine acutely increased to 1.6, possibly due to vancomycin.  Has normalized now  Hypernatremia-resolved, continue tube feeds/water flushes.  Leukocytosis-overall improving  Hypertension-Home medications include amlodipine 5 mg, losartan 100 mg.  Blood pressure is normal here, monitor  Hypokalemia-monitor and replete as necessary  Acute urinary retention-failed voiding trial on 5/27, he was placed on doxazosin, continue, once more awake will give another voiding trial  Obesity -BMI 33  Scheduled Meds: . amiodarone  200 mg Per Tube BID  . Chlorhexidine Gluconate Cloth  6 each Topical Q0600  . doxazosin  2 mg Per Tube Daily  . feeding supplement (PROSource TF)  45 mL  Per Tube TID  . feeding supplement (VITAL HIGH PROTEIN)  1,000 mL Per Tube Q24H  . heparin injection (subcutaneous)  5,000 Units  Subcutaneous Q8H  . insulin aspart  0-20 Units Subcutaneous TID WC  . mouth rinse  15 mL Mouth Rinse BID  . sodium chloride flush  10-40 mL Intracatheter Q12H   Continuous Infusions: . dextrose 50 mL/hr at 05/11/21 1300  . feeding supplement (VITAL AF 1.2 CAL) 1,000 mL (05/09/21 0410)  . linezolid (ZYVOX) IV Stopped (05/11/21 2315)  . penicillin g continuous IV infusion 12 Million Units (05/11/21 2216)   PRN Meds:.bisacodyl, sodium chloride flush  Diet Orders (From admission, onward)    Start     Ordered   05/09/21 1241  Diet NPO time specified  Diet effective now        05/09/21 1240          DVT prophylaxis: heparin injection 5,000 Units Start: 05/05/21 2200 SCDs Start: 04/30/21 0120     Code Status: Full Code  Family Communication: no family at bedside   Status is: Inpatient  Remains inpatient appropriate because:Altered mental status and Inpatient level of care appropriate due to severity of illness   Dispo: The patient is from: Home              Anticipated d/c is to: TBD              Patient currently is not medically stable to d/c.   Difficult to place patient No   Level of care: Telemetry Cardiac  Consultants:  PCCM ID  Procedures:  2D echo LP  Microbiology  CSF culture Streptococcus pneumonia Tracheal aspirate MRSA  Antimicrobials: Penicillin 5/21 >> Vancomycin 5/26 >> 5/28 Linezolid 5/28 >>  Objective: Vitals:   05/11/21 1300 05/11/21 1537 05/11/21 1947 05/12/21 0506  BP: 132/62 137/65 (!) 117/55 140/75  Pulse: 72 75 78 67  Resp: (!) 24 16 20 20   Temp:  97.8 F (36.6 C) 97.9 F (36.6 C) 98.8 F (37.1 C)  TempSrc:  Oral Oral Axillary  SpO2: 100% 98% 93% 96%  Weight:      Height:        Intake/Output Summary (Last 24 hours) at 05/12/2021 1013 Last data filed at 05/12/2021 0346 Gross per 24 hour  Intake 903.21 ml  Output 1500 ml  Net -596.79 ml   Filed Weights   05/09/21 0352 05/10/21 0500 05/11/21 0500  Weight: 112.1 kg 109.7  kg 108.5 kg    Examination:  Constitutional: No apparent distress, confused Eyes: no scleral icterus ENMT: Mucous membranes are dry Neck: normal, supple Respiratory: No wheezing, no crackles, overall clear Cardiovascular: Regular rate and rhythm, no murmurs / rubs / gallops. No LE edema.  Abdomen: non distended, no tenderness. Bowel sounds positive.  Musculoskeletal: no clubbing / cyanosis.  Skin: no rashes Neurologic: Nonfocal, follows commands Psychiatric: Alert to self, place  Data Reviewed: I have independently reviewed following labs and imaging studies   CBC: Recent Labs  Lab 05/08/21 0534 05/09/21 0409 05/10/21 0500 05/11/21 0349 05/12/21 0059  WBC 36.1* 39.7* 31.6* 22.7* 16.8*  HGB 16.7 16.0 15.0 13.8 13.7  HCT 51.2 49.4 46.3 42.2 41.6  MCV 97.0 98.2 97.3 97.7 96.7  PLT 191 188 168 167 253   Basic Metabolic Panel: Recent Labs  Lab 05/06/21 0540 05/07/21 0359 05/07/21 2355 05/08/21 1000 05/09/21 0409 05/10/21 0500 05/11/21 0349 05/12/21 0059  NA 145 132*   < > 147* 146* 148* 145 142  K 4.2  4.3   < > 4.1 3.9 3.3* 3.2* 3.7  CL 110 97*   < > 112* 113* 108 111 107  CO2 30 27   < > 27 27 31 30 29   GLUCOSE 178* 213*   < > 171* 157* 203* 142* 111*  BUN 36* 41*   < > 65* 90* 56* 36* 27*  CREATININE 0.81 0.80   < > 0.96 1.57* 0.96 0.70 0.76  CALCIUM 7.3* 6.8*   < > 7.6* 7.6* 7.6* 7.8* 8.0*  MG 2.4 2.2  --   --  2.8*  --   --  2.1  PHOS  --  3.2  --   --   --   --   --  3.1   < > = values in this interval not displayed.   Liver Function Tests: Recent Labs  Lab 05/10/21 0500  AST 82*  ALT 86*  ALKPHOS 36*  BILITOT 0.5  PROT 4.7*  ALBUMIN 1.7*   Coagulation Profile: No results for input(s): INR, PROTIME in the last 168 hours. HbA1C: No results for input(s): HGBA1C in the last 72 hours. CBG: Recent Labs  Lab 05/11/21 1540 05/11/21 1951 05/11/21 2334 05/12/21 0307 05/12/21 0720  GLUCAP 102* 121* 125* 106* 102*    Recent Results (from the past  240 hour(s))  Culture, Respiratory w Gram Stain     Status: None   Collection Time: 05/07/21 11:58 AM   Specimen: Tracheal Aspirate; Respiratory  Result Value Ref Range Status   Specimen Description TRACHEAL ASPIRATE  Final   Special Requests Immunocompromised  Final   Gram Stain   Final    ABUNDANT WBC PRESENT, PREDOMINANTLY PMN ABUNDANT GRAM POSITIVE COCCI RARE YEAST Performed at [facility], [address] [place], [place] [address]    Culture   Final    MODERATE METHICILLIN RESISTANT STAPHYLOCOCCUS AUREUS CORRECTED ON 05/27 AT 5621: PREVIOUSLY REPORTED AS MODERATE STAPHYLOCOCCUS AUREUS   Report Status 05/09/2021 FINAL  Final   Organism ID, Bacteria METHICILLIN RESISTANT STAPHYLOCOCCUS AUREUS  Final      Susceptibility   Methicillin resistant staphylococcus aureus - MIC*    CIPROFLOXACIN >=8 RESISTANT Resistant     ERYTHROMYCIN >=8 RESISTANT Resistant     GENTAMICIN <=0.5 SENSITIVE Sensitive     OXACILLIN >=4 RESISTANT Resistant     TETRACYCLINE <=1 SENSITIVE Sensitive     VANCOMYCIN 1 SENSITIVE Sensitive     TRIMETH/SULFA >=320 RESISTANT Resistant     CLINDAMYCIN <=0.25 SENSITIVE Sensitive     RIFAMPIN <=0.5 SENSITIVE Sensitive     Inducible Clindamycin NEGATIVE Sensitive     * MODERATE METHICILLIN RESISTANT STAPHYLOCOCCUS AUREUS CORRECTED ON 05/27 AT 3086: PREVIOUSLY REPORTED AS MODERATE STAPHYLOCOCCUS AUREUS  Culture, blood (Routine X 2) w Reflex to ID Panel     Status: None   Collection Time: 05/07/21  3:50 PM   Specimen: BLOOD  Result Value Ref Range Status   Specimen Description BLOOD LEFT ANTECUBITAL  Final   Special Requests   Final    BOTTLES DRAWN AEROBIC AND ANAEROBIC Blood Culture results may not be optimal due to an inadequate volume of blood received in culture bottles   Culture   Final    NO GROWTH 5 DAYS Performed at [facility], [place] [address] [place], [place] [address]    Report Status 05/12/2021 FINAL  Final  Culture, blood (Routine X 2)  w Reflex to ID Panel     Status: None   Collection Time:  05/07/21  3:57 PM   Specimen: BLOOD  Result Value Ref Range Status   Specimen Description BLOOD LEFT ANTECUBITAL  Final   Special Requests   Final    BOTTLES DRAWN AEROBIC AND ANAEROBIC Blood Culture adequate volume   Culture   Final    NO GROWTH 5 DAYS Performed at [facility], [address], [place], [place] [address]    Report Status 05/12/2021 FINAL  Final     Radiology Studies: DG Abd 1 View  Result Date: 05/11/2021 CLINICAL DATA:  60 year old male status post nasogastric tube placement. EXAM: ABDOMEN - 1 VIEW COMPARISON:  05/09/2021 FINDINGS: Nonspecific bowel gas pattern with mild gaseous distension of the small and large bowel, similar to comparison. Gastric decompression tube is kinked at its proximal side hole in the gastric fundus with the tip oriented toward the gastroesophageal junction. Cholecystectomy clips in the right upper quadrant. No acute osseous abnormality. IMPRESSION: Gastric decompression tube is kinked at its proximal side hole within the gastric fundus. The tip of the catheter is oriented toward the gastroesophageal junction. Continued advancement into the stomach may improve function. Electronically Signed   By: Ruthann Cancer MD   On: 05/11/2021 10:26    Marzetta Board, MD, PhD [facility]  Between 7 am - 7 pm I am available, please contact me via Amion (for emergencies) or Securechat (non urgent messages)  Between 7 pm - 7 am I am not available, please contact night coverage MD/APP via Amion

## 2021-05-12 NOTE — Progress Notes (Signed)
Occupational Therapy Treatment Patient Details Name: Hayden Mcbride MRN: [id number] DOB: 1961-02-14 Today's Date: 05/12/2021    History of present illness Pt is 60 yo male who presented on 04/29/21 with HA and ear pain became combative with slurred speech and intubated 5/18 > extubated 5/27. Found to have bacterial meningitis. PMH: HTN, GERD, OSA on CPAP, thyroid disease, colonic polyps.   OT comments  Pt is making excellent progress.  He was able to move to EOB with min guard assist. He is able to don/doff socks with min guard assist, and perform grooming with set up assistance.  He demonstrates improving activity tolerance.  Recommend CIR.   Follow Up Recommendations  CIR    Equipment Recommendations  3 in 1 bedside commode    Recommendations for Other Services Rehab consult    Precautions / Restrictions Precautions Precautions: Fall Precaution Comments: generalized weakness, delayed processing Restrictions Weight Bearing Restrictions: No       Mobility Bed Mobility Overal bed mobility: Needs Assistance Bed Mobility: Supine to Sit;Sit to Supine     Supine to sit: Min guard Sit to supine: Min guard   General bed mobility comments: requires increased time and min guard assist for safety    Transfers Overall transfer level: Needs assistance Equipment used: Rolling walker (2 wheeled) Transfers: Sit to/from Stand Sit to Stand: Mod assist;Max assist         General transfer comment: Mod A to rise from elevated bed and max A from low chair; cues for hand placement; cues and assist for controlled descent    Balance Overall balance assessment: Needs assistance Sitting-balance support: No upper extremity supported;Feet unsupported Sitting balance-Leahy Scale: Good Sitting balance - Comments: able to don/doff seated EOB without LOB   Standing balance support: Bilateral upper extremity supported Standing balance-Leahy Scale: Poor Standing balance comment: requiring RW  and min A                           ADL either performed or assessed with clinical judgement   ADL Overall ADL's : Needs assistance/impaired     Grooming: Wash/dry hands;Wash/dry face;Brushing hair;Set up;Supervision/safety;Sitting               Lower Body Dressing: Maximal assistance;Sit to/from stand Lower Body Dressing Details (indicate cue type and reason): Pt able to don/doff socks with min A while seated EOB.  He was too fagitued to stand up to simulate pulling pants over hips                     Vision       Perception     Praxis      Cognition Arousal/Alertness: Awake/alert Behavior During Therapy: Flat affect Overall Cognitive Status: Impaired/Different from baseline Area of Impairment: Attention;Memory;Safety/judgement;Problem solving                 Orientation Level: Situation Current Attention Level: Selective;Alternating Memory: Decreased short-term memory Following Commands: Follows one step commands consistently;Follows multi-step commands inconsistently Safety/Judgement: Decreased awareness of deficits;Decreased awareness of safety   Problem Solving: Slow processing;Requires verbal cues General Comments: pt is slow to respond.  He was able to recall events of earlier therapies, but occasionally gets details mixed up.        Exercises     Shoulder Instructions       General Comments VSS on RA.  RN had reported Cortrak feeding stopped due to suspected incorrect position.  Did note  pt with auidable crackling with breathing that wife reports is new-suspected due to possible incorrect position cortrak.    Pertinent Vitals/ Pain       Pain Assessment: No/denies pain  Home Living                                      Lives With: Spouse    Prior Functioning/Environment              Frequency  Min 2X/week        Progress Toward Goals  OT Goals(current goals can now be found in the care plan  section)  Progress towards OT goals: Progressing toward goals  Acute Rehab OT Goals Patient Stated Goal: "I want to go home"  Plan Discharge plan remains appropriate    Co-evaluation                 AM-PAC OT "6 Clicks" Daily Activity     Outcome Measure   Help from another person eating meals?: Total Help from another person taking care of personal grooming?: A Little Help from another person toileting, which includes using toliet, bedpan, or urinal?: A Lot Help from another person bathing (including washing, rinsing, drying)?: A Lot Help from another person to put on and taking off regular upper body clothing?: A Lot Help from another person to put on and taking off regular lower body clothing?: A Lot 6 Click Score: 12    End of Session    OT Visit Diagnosis: Unsteadiness on feet (R26.81);Muscle weakness (generalized) (M62.81);Cognitive communication deficit (R41.841)   Activity Tolerance Patient tolerated treatment well   Patient Left in bed;with call bell/phone within reach;with bed alarm set;Other (comment) (MD present)   Nurse Communication Mobility status;Need for lift equipment        Time: 7829-5621 OT Time Calculation (min): 30 min  Charges: OT General Charges $OT Visit: 1 Visit OT Treatments $Self Care/Home Management : 8-22 mins $Therapeutic Activity: 8-22 mins  Nilsa Nutting., OTR/L Acute Rehabilitation Services Pager [phone] Office [place], [place] 05/12/2021, 3:52 PM

## 2021-05-12 NOTE — PMR Pre-admission (Addendum)
PMR Admission Coordinator Pre-Admission Assessment  Patient: Hayden Mcbride is an 60 y.o., male MRN: [id number] DOB: 05-09-1961 Height: 5' 10.98" (180.3 cm) Weight: 104.7 kg              Insurance Information HMO:     PPO:  yes    PCP:      IPA:      80/20:      OTHER:  PRIMARY: : UMR/U[employer] Choice Plus- 80/20, [employer],  - YCXKGY#:[id number]      Subscriber:  Pt. Received approval from [place] at [email or web address]  For admission 6/7 for 7 days, with updates due 6/10 CM Name: Gasper Lloyd     Phone#:   [phone]   Fax#: [phone] Pre-Cert#: [id number]     Employer:  [employer] Date: 12/15/2019 - still active Deductible: $1,500 ($1,500 met) OOP Max: $3,750 ($2,357.35 met) CIR: 80% coverage, 20% co-isurance SNF: 80% coverage, 20% co-insurance, limited to 120 days/cal yr. Outpatient: $35 copay/100% coverage, limited to 120 visits/cal yr Home 80% coverage, 20% co-insurance, limited to 120 days/cal yr DME: 80% coverage, 20% co-insurance Providers: in network   SECONDARY: none      Policy#:       Phone#:    Development worker, community:       Phone#:   The Engineer, petroleum" for patients in Inpatient Rehabilitation Facilities with attached "Privacy Act [place] Records" was provided and verbally reviewed with: N/A  Emergency Contact Information Contact Information    Name Relation Home Work Mobile   Heidt,Jill Spouse [phone]       Current Medical History  Patient Admitting Diagnosis: Bacterial Meningitis  History of Present Illness: Hayden Mcbride is a 60 y.o. RH-male with history of HTN, OSA, glaucoma, right hearing loss, reports of recent visit to [place] who was admitted on 04/29/2020 with mental status changes nonsensical speech, lethargy, headaches, right gaze preference and became progressively agitated in ED requiring intubation for airway protection.  MRI brain done showing diffuse leptomeningeal enhancement and abnormal FLAIR signal throughout subarachnoid  spaces compatible with acute meningitis as well as punctate DWI foci along midline parieto-occipital sulci with question of trace purulence.  CTA head/neck was negative for LVO or high-grade stenosis and showed incidental 4.7 cm right thyroid nodule. LP done showing marked xanthochromia with elevated opening pressure consistent with encephalitis and he was started on acyclovir as well as broad-spectrum antibiotics.  CSF revealed neutrophilic pleocytosis with WBC 585, protein>600, glucose<20  and gram stain showing few Streptococcus pneumonia.  Blood cultures x2 negative.  EEG without evidence of seizures.  Antibiotics narrowed to high-dose penicillin per ID input.  He continued to have lethargy with decrease in movement, disconjugate gaze as well as decrease in ability to follow commands.  He was noted to have decreasing movement on LLE.  Neurology recommended repeat CT head 5/23 which showed increased density in some of dural venous sinuses question hemoconcentration versus thrombus.  MRI/MRV brain repeated showing changes consistent with bacterial meningitis and increase in numerous foci of restricted diffusion about subarachnoid spaces with areas of septation and synechiae formation consistent with bacterial meningitis, no evidence of bacterial ventriculitis or venous thrombosis.  He developed fevers with leukocytosis and sputum cultures positive for MRSA.  He was started on linezolid and tolerated extubation to BiPAP by 05/27.  CT chest on 05/28 showing consolidation at the posterior base right lower lobe consistent with pneumonia as well as incidental bilateral adrenal masses with recommendations of adrenal MRI for further assessment. 2D echo  done showing EF 65 to 70% with no valve abnormality.  He did develop A. fib/a flutter felt to be due to stress of underlying illness and was started on amiodarone for rate control.  On core track for tube feeds and electrolyte abnormalities treated with D5W as well  as potassium/magnesium replacement.  Therapy evaluations completed this weekend revealing pharyngeal dysphagia with thick secretions and difficulty initiating swallow, cognitive deficits with delay in processing and generalized weakness BUE>BLE with bilateral knee instability affecting mobility and ADLs.    CIR was recommended due to functional decline  Complete NIHSS TOTAL: 0 Glasgow Coma Scale Score: 15  Past Medical History  Past Medical History:  Diagnosis Date  . Allergy   . Dry eyes   . GERD (gastroesophageal reflux disease)   . Glaucoma   . Hx of adenomatous colonic polyps 07/08/2006   06/2006 - diminutive adenoma 04/19/2015 - diminutive adenoma and one polyp lost - repeat colonoscopy 2021  . Hypertension   . Post-operative nausea and vomiting   . Sleep apnea    wears CPAP  . Thyroid disease    long ago- half thyroid removed ~20 yrs ago     Family History  family history includes Cancer in his maternal grandmother; Colon cancer in his father; Diverticulitis in his mother.  Prior Rehab/Hospitalizations:  Has the patient had prior rehab or hospitalizations prior to admission? No  Has the patient had major surgery during 100 days prior to admission? Yes  Current Medications   Current Facility-Administered Medications:  .  acetaminophen (TYLENOL) tablet 650 mg, 650 mg, Oral, Q6H PRN, Caren Griffins, MD, 650 mg at 05/14/21 0852 .  amiodarone (PACERONE) tablet 200 mg, 200 mg, Per Tube, BID, Icard, Bradley L, DO, 200 mg at 05/19/21 1010 .  bisacodyl (DULCOLAX) suppository 10 mg, 10 mg, Rectal, Daily PRN, Desai, Rahul P, PA-C, 10 mg at 05/06/21 0941 .  Chlorhexidine Gluconate Cloth 2 % PADS 6 each, 6 each, Topical, Q0600, Collene Gobble, MD, 6 each at 05/19/21 [phone] .  diclofenac Sodium (VOLTAREN) 1 % topical gel 2 g, 2 g, Topical, TID PRN, Shalhoub, Sherryll Burger, MD, 2 g at 05/18/21 2131 .  doxazosin (CARDURA) tablet 2 mg, 2 mg, Per Tube, Daily, Icard, Bradley L, DO, 2 mg at  05/19/21 1010 .  feeding supplement (PROSource TF) liquid 45 mL, 45 mL, Per Tube, QID, Gherghe, Costin M, MD, 45 mL at 05/19/21 1010 .  feeding supplement (VITAL 1.5 CAL) liquid 1,000 mL, 1,000 mL, Per Tube, Continuous, Caren Griffins, MD, Last Rate: 55 mL/hr at 05/19/21 0433, 1,000 mL at 05/19/21 0433 .  free water 250 mL, 250 mL, Per Tube, Q6H, Gherghe, Costin M, MD, 250 mL at 05/19/21 0429 .  heparin injection 5,000 Units, 5,000 Units, Subcutaneous, Q8H, Collene Gobble, MD, 5,000 Units at 05/19/21 0428 .  insulin aspart (novoLOG) injection 0-20 Units, 0-20 Units, Subcutaneous, TID WC, Rigoberto Noel, MD, 3 Units at 05/18/21 1604 .  MEDLINE mouth rinse, 15 mL, Mouth Rinse, BID, Byrum, Rose Fillers, MD, 15 mL at 05/18/21 2132 .  nystatin cream (MYCOSTATIN), , Topical, BID, Donne Hazel, MD, Given at 05/19/21 1011 .  sodium chloride flush (NS) 0.9 % injection 10-40 mL, 10-40 mL, Intracatheter, Q12H, Collene Gobble, MD, 10 mL at 05/18/21 2132 .  sodium chloride flush (NS) 0.9 % injection 10-40 mL, 10-40 mL, Intracatheter, PRN, Collene Gobble, MD  Patients Current Diet:  Diet Order  DIET DYS 3 Room service appropriate? Yes; Fluid consistency: Honey Thick  Diet effective now                5/31 Cortrak for feeds placed  Precautions / Restrictions Precautions Precautions: Fall Precaution Comments: generalized weakness, delayed processing Restrictions Weight Bearing Restrictions: No   Has the patient had 2 or more falls or a fall with injury in the past year?No  Prior Activity Level Community (5-7x/wk): Pt. was working PTA  Prior Functional Level Prior Function Level of Independence: Independent Comments: Pt was fully independent.  He works as a Corporate investment banker.  He enjoys playing golf  Self Care: Did the patient need help bathing, dressing, using the toilet or eating?  Independent  Indoor Mobility: Did the patient need assistance with walking from room to room  (with or without device)? Independent  Stairs: Did the patient need assistance with internal or external stairs (with or without device)? Independent  Functional Cognition: Did the patient need help planning regular tasks such as shopping or remembering to take medications? Independent  Home Assistive Devices / Equipment Home Assistive Devices/Equipment: None Home Equipment: Shower seat - built in  Prior Device Use: Indicate devices/aids used by the patient prior to current illness, exacerbation or injury? None of the above  Current Functional Level Cognition  Overall Cognitive Status: Impaired/Different from baseline Current Attention Level: Selective Orientation Level: Oriented X4 Following Commands: Follows one step commands consistently,Follows multi-step commands inconsistently Safety/Judgement: Decreased awareness of deficits,Decreased awareness of safety General Comments: Pt demonstrates delayed processing and requires cues to initiate    Extremity Assessment (includes Sensation/Coordination)  Upper Extremity Assessment: Generalized weakness RUE Deficits / Details: shoulder grossly 2/5; elbow distally 4-/5 RUE Coordination: decreased fine motor,decreased gross motor LUE Deficits / Details: shoulder grossly 3-/5; elbow distally grossly 4-/5 LUE Coordination: decreased gross motor,decreased fine motor  Lower Extremity Assessment: Defer to PT evaluation    ADLs  Overall ADL's : Needs assistance/impaired Eating/Feeding: NPO Grooming: Wash/dry hands,Wash/dry face,Brushing hair,Set up,Supervision/safety,Sitting Upper Body Bathing: Maximal assistance,Sitting Lower Body Bathing: Maximal assistance,Sit to/from stand,Sitting/lateral leans Upper Body Dressing : Maximal assistance,Sitting Lower Body Dressing: Maximal assistance,Sit to/from stand Lower Body Dressing Details (indicate cue type and reason): Pt able to don/doff socks with min A while seated EOB.  He was too fagitued to  stand up to simulate pulling pants over hips Toilet Transfer: Moderate assistance,Squat-pivot,BSC Toileting- Clothing Manipulation and Hygiene: Total assistance,Sitting/lateral lean,Sit to/from stand Functional mobility during ADLs: Moderate assistance    Mobility  Overal bed mobility: Needs Assistance Bed Mobility: Sit to Sidelying Supine to sit: Min guard,HOB elevated Sit to supine: Min guard Sit to sidelying: Min assist General bed mobility comments: Assist to bring legs back up into the bed    Transfers  Overall transfer level: Needs assistance Equipment used: Rolling walker (2 wheeled) Transfers: Sit to/from [employer] Sit to Stand: Min assist Stand pivot transfers: Min assist Squat pivot transfers: Mod assist General transfer comment: Assist to bring hips up and for balance. Verbal cues for hand placement. Chair to bed with stand pivot using walker    Ambulation / Gait / Stairs / Wheelchair Mobility  Ambulation/Gait Ambulation/Gait assistance: +2 safety/equipment,Min assist Gait Distance (Feet): 6 Feet Assistive device: Rolling walker (2 wheeled) Gait Pattern/deviations: Shuffle,Step-through pattern,Decreased step length - right,Decreased step length - left General Gait Details: Assist for balance and support. Slow initiation of steps. Followed with chair for safety. Gait velocity: decr Gait velocity interpretation: <1.31 ft/sec, indicative of household ambulator  Posture / Balance Dynamic Sitting Balance Sitting balance - Comments: able to don/doff seated EOB without LOB Balance Overall balance assessment: Needs assistance Sitting-balance support: No upper extremity supported,Feet supported Sitting balance-Leahy Scale: Good Sitting balance - Comments: able to don/doff seated EOB without LOB Standing balance support: Bilateral upper extremity supported Standing balance-Leahy Scale: Poor Standing balance comment: walker and min assist for static  standing    Special needs/care consideration Contact precautions for MRSA    Previous Home Environment  Living Arrangements: Spouse/significant other  Lives With: Spouse Available Help at Discharge: Family,Available PRN/intermittently Type of Home: House Home Layout: One level Home Access: Stairs to enter Entrance Stairs-Rails: Right Entrance Stairs-Number of Steps: 3 Bathroom Shower/Tub: Tub/shower Psychologist, occupational: [place]: No  Discharge Living Setting Plans for Discharge Living Setting: Patient's home Type of Home at Discharge: House Discharge Home Layout: One level Discharge Home Access: Stairs to enter Entrance Stairs-Rails: Can reach both Entrance Stairs-Number of Steps: 3-4 Discharge Bathroom Shower/Tub: Tub/shower unit Discharge Bathroom Toilet: Handicapped height Discharge Bathroom Accessibility: Yes How Accessible: Accessible via walker Does the patient have any problems obtaining your medications?: No  Social/Family/Support Systems Patient Roles: Spouse Contact Information: [phone] Anticipated Caregiver: Faris Coolman Anticipated Caregiver's Contact Information: [phone] Ability/Limitations of Caregiver: Can provide Min A Caregiver Availability: 24/7 Discharge Plan Discussed with Primary Caregiver: Yes Is Caregiver In Agreement with Plan?: Yes Does Caregiver/Family have Issues with Lodging/Transportation while Pt is in Rehab?: No  Goals Patient/Family Goal for Rehab: PT/OT/SLP Min A Expected length of stay: 21-24 days Pt/Family Agrees to Admission and willing to participate: Yes Program Orientation Provided & Reviewed with Pt/Caregiver Including Roles  & Responsibilities: Yes  Decrease burden of Care through IP rehab admission: n/a  Possible need for SNF placement upon discharge:not anticipated   Patient Condition: This patient's medical and functional status has changed since the consult dated: 05/12/29 in  which the Rehabilitation Physician determined and documented that the patient's condition is appropriate for intensive rehabilitative care in an inpatient rehabilitation facility. See "History of Present Illness" (above) for medical update. Functional changes are: Pt. Now min-mod A with transfers, mobility, and ADLS and taking a PO diet. Patient's medical and functional status update has been discussed with the Rehabilitation physician and patient remains appropriate for inpatient rehabilitation. Will admit to inpatient rehab today.  Preadmission Screen Completed By: Clemens Catholic with updates by  Genella Mech, CCC-SLP, 05/19/2021 11:15 AM ______________________________________________________________________   Discussed status with Dr. Naaman Plummer on 05/19/2021 at 1000 and received approval for admission today.  Admission Coordinator: Clemens Catholic with updates by  Genella Mech, time 1115/Date 6/6/20200

## 2021-05-12 NOTE — Progress Notes (Signed)
Nutrition Follow-up  DOCUMENTATION CODES:   Not applicable  INTERVENTION:   Plan to exchange NG tube for Cortrak tomorrow (5/31)  Tube Feeding via NG:  Change to Vital 1.5 at 55 ml/hr Pro-Source TF 45 mL QID Provides 2140 kcals, 133 g of protein and 1003 mL of free water  Add Free water 250 mL q 6 hours or 40 ml/hr: total free water 2003 mL   NUTRITION DIAGNOSIS:   Increased nutrient needs related to acute illness as evidenced by estimated needs.  Being addressed via TF   GOAL:   Patient will meet greater than or equal to 90% of their needs  Met via TF   MONITOR:   Vent status,Skin,TF tolerance,Weight trends,Labs,I & O's  REASON FOR ASSESSMENT:   Ventilator    ASSESSMENT:   Patient with PMH significant for HTN, GERD, colonic polyps, thyroid disease, and OSA. Presents this admission with bacterial meningitis.  5/27 Extubated  NPO, SLP following. Pt is confused, alert and oriented x 2. Mittens in place  10 fr NG tube present and TF infusing. Vital High Protein at 20 ml/hr infusing. Also noted order for Vital AF 1.2 at 65 ml/hr active Noted order for Cortrak, service available tomorrow and can exchange at that time  No skin breakdown noted at this time + stool   Weight down to 108.5 kg; admit weight 116.8 kg  Labs: LR at 50 ml/hr Meds: reviewed  Diet Order:   Diet Order            Diet NPO time specified  Diet effective now                 EDUCATION NEEDS:   Not appropriate for education at this time  Skin:  Skin Assessment: Reviewed RN Assessment  Last BM:  5/30 large type 7  Height:   Ht Readings from Last 1 Encounters:  05/01/21 5' 10.98" (1.803 m)    Weight:   Wt Readings from Last 1 Encounters:  05/11/21 108.5 kg    BMI:  Body mass index is 33.38 kg/m.  Estimated Nutritional Needs:   Kcal:  2100-2300 kcals  Protein:  115-140  Fluid:  >/= 2 L   Kerman Passey MS, RDN, LDN, CNSC Registered Dietitian III Clinical  Nutrition RD Pager and On-Call Pager Number Located in [place]

## 2021-05-12 NOTE — Progress Notes (Signed)
RT NOTES: Unable to do CPT at this time. Pt unavailable.

## 2021-05-12 NOTE — Progress Notes (Signed)
Walked into patient's room and found nasogastric tube completely out of patient's nare.

## 2021-05-13 ENCOUNTER — Inpatient Hospital Stay (HOSPITAL_COMMUNITY): Payer: Commercial Managed Care - PPO

## 2021-05-13 DIAGNOSIS — T17908A Unspecified foreign body in respiratory tract, part unspecified causing other injury, initial encounter: Secondary | ICD-10-CM

## 2021-05-13 DIAGNOSIS — J9811 Atelectasis: Secondary | ICD-10-CM

## 2021-05-13 DIAGNOSIS — T17908D Unspecified foreign body in respiratory tract, part unspecified causing other injury, subsequent encounter: Secondary | ICD-10-CM

## 2021-05-13 DIAGNOSIS — J9601 Acute respiratory failure with hypoxia: Secondary | ICD-10-CM | POA: Diagnosis not present

## 2021-05-13 DIAGNOSIS — G001 Pneumococcal meningitis: Secondary | ICD-10-CM | POA: Diagnosis not present

## 2021-05-13 DIAGNOSIS — R4182 Altered mental status, unspecified: Secondary | ICD-10-CM | POA: Diagnosis not present

## 2021-05-13 LAB — COMPREHENSIVE METABOLIC PANEL
ALT: 131 U/L — ABNORMAL HIGH (ref 0–44)
AST: 85 U/L — ABNORMAL HIGH (ref 15–41)
Albumin: 1.8 g/dL — ABNORMAL LOW (ref 3.5–5.0)
Alkaline Phosphatase: 45 U/L (ref 38–126)
Anion gap: 7 (ref 5–15)
BUN: 17 mg/dL (ref 8–23)
CO2: 26 mmol/L (ref 22–32)
Calcium: 8 mg/dL — ABNORMAL LOW (ref 8.9–10.3)
Chloride: 105 mmol/L (ref 98–111)
Creatinine, Ser: 0.84 mg/dL (ref 0.61–1.24)
GFR, Estimated: >60 mL/min (ref >60)
Glucose, Bld: 105 mg/dL — ABNORMAL HIGH (ref 70–99)
Potassium: 4 mmol/L (ref 3.5–5.1)
Sodium: 138 mmol/L (ref 135–145)
Total Bilirubin: 1.1 mg/dL (ref 0.3–1.2)
Total Protein: 5.5 g/dL — ABNORMAL LOW (ref 6.5–8.1)

## 2021-05-13 LAB — CBC
HCT: 41.9 % (ref 39.0–52.0)
Hemoglobin: 13.8 g/dL (ref 13.0–17.0)
MCH: 31.8 pg (ref 26.0–34.0)
MCHC: 32.9 g/dL (ref 30.0–36.0)
MCV: 96.5 fL (ref 80.0–100.0)
Platelets: 208 10*3/uL (ref 150–400)
RBC: 4.34 MIL/uL (ref 4.22–5.81)
RDW: 13.2 % (ref 11.5–15.5)
WBC: 15.4 10*3/uL — ABNORMAL HIGH (ref 4.0–10.5)
nRBC: 0 % (ref 0.0–0.2)

## 2021-05-13 LAB — GLUCOSE, CAPILLARY
Glucose-Capillary: 109 mg/dL — ABNORMAL HIGH (ref 70–99)
Glucose-Capillary: 122 mg/dL — ABNORMAL HIGH (ref 70–99)
Glucose-Capillary: 132 mg/dL — ABNORMAL HIGH (ref 70–99)
Glucose-Capillary: 135 mg/dL — ABNORMAL HIGH (ref 70–99)
Glucose-Capillary: 94 mg/dL (ref 70–99)
Glucose-Capillary: 98 mg/dL (ref 70–99)
Glucose-Capillary: 99 mg/dL (ref 70–99)

## 2021-05-13 LAB — MAGNESIUM: Magnesium: 2 mg/dL (ref 1.7–2.4)

## 2021-05-13 IMAGING — DX DG ABD PORTABLE 1V
1 series · 1 of 1 positions shown · non-contrast
Comparison: Chest radiograph [DATE]

CLINICAL DATA: Feeding tube placement.

EXAM:
PORTABLE ABDOMEN - 1 VIEW

[abdomen]
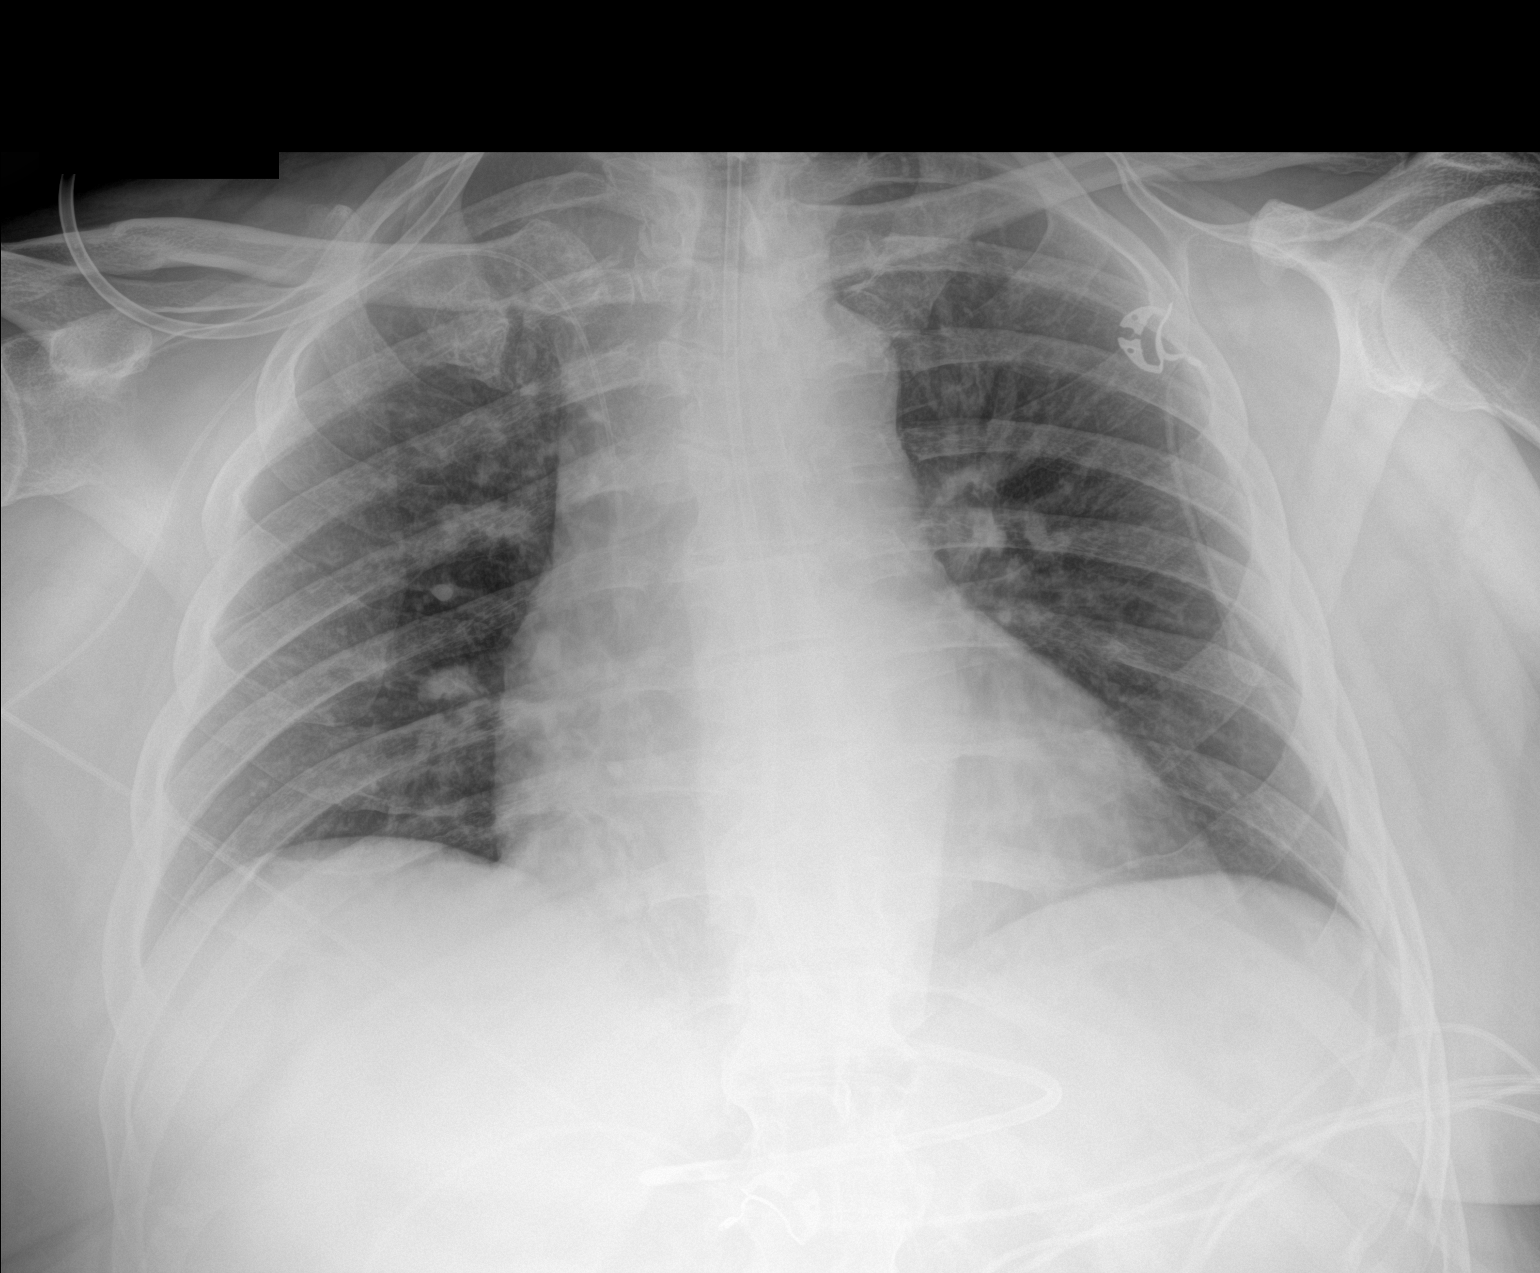

[1 of 1 positions shown; findings below may reference images not displayed]

FINDINGS: A feeding tube has been placed and terminates in the expected region
of the mid gastric body. A right PICC remains in place. Lung volumes
are mildly low without evidence of airspace consolidation, edema, a
sizable pleural effusion, or pneumothorax. Only the uppermost
portion of the abdomen was included on this study.
IMPRESSION: Feeding tube placement as above.

## 2021-05-13 MED ORDER — ACETAMINOPHEN 325 MG PO TABS
650.0000 mg | ORAL_TABLET | Freq: Four times a day (QID) | ORAL | Status: DC | PRN
  Administered 2021-05-13 – 2021-05-14 (×2): 650 mg via ORAL
  Filled 2021-05-13 (×2): qty 2

## 2021-05-13 NOTE — Progress Notes (Signed)
Physical Therapy Treatment Patient Details Name: Hayden Mcbride MRN: [id number] DOB: 04/03/1961 Today's Date: 05/13/2021    History of Present Illness Pt is 60 yo male who presented on 04/29/21 with HA and ear pain became combative with slurred speech and intubated 5/18 > extubated 5/27. Found to have bacterial meningitis. PMH: HTN, GERD, OSA on CPAP, thyroid disease, colonic polyps.    PT Comments    Continuing work on functional mobility and activity tolerance;  Making very nice progress with upright activity, taking steps; He indicated he is pleased with being able to work with therapies, he also indicated he was quite disappointed that he needs to continue the cortrack; took short steps with heavy dependence on the RW for support; Continue to recommend comprehensive inpatient rehab (CIR) for post-acute therapy needs.    Follow Up Recommendations  CIR;Supervision/Assistance - 24 hour     Equipment Recommendations  Rolling walker with 5" wheels;Wheelchair cushion (measurements PT);Wheelchair (measurements PT);3in1 (PT) (needs further assessment)    Recommendations for Other Services Rehab consult     Precautions / Restrictions Precautions Precautions: Fall Precaution Comments: generalized weakness, delayed processing    Mobility  Bed Mobility Overal bed mobility: Needs Assistance Bed Mobility: Supine to Sit     Supine to sit: Min guard;Min assist     General bed mobility comments: requires increased time and min guard assist for safety    Transfers Overall transfer level: Needs assistance Equipment used: Rolling walker (2 wheeled) Transfers: Sit to/from Stand Sit to Stand: Max assist;+2 physical assistance         General transfer comment: Max assist to stand from low bed; cues for hand placement and safety  Ambulation/Gait Ambulation/Gait assistance: Mod assist;+2 safety/equipment Gait Distance (Feet): 4 Feet Assistive device: Rolling walker (2 wheeled) Gait  Pattern/deviations: Step-to pattern;Decreased stride length;Shuffle     General Gait Details: Pt's legs/knees very shakey but not buckling, though noted some pushback into knee hyperextension for stance stability;  Cues for steps, posture, and RW.  Had very close chair follow.  Cues for rest breaks   Stairs             Wheelchair Mobility    Modified Rankin (Stroke Patients Only)       Balance     Sitting balance-Leahy Scale: Good       Standing balance-Leahy Scale: Poor Standing balance comment: requiring RW and min A                            Cognition Arousal/Alertness: Awake/alert Behavior During Therapy: WFL for tasks assessed/performed Overall Cognitive Status: Impaired/Different from baseline                     Current Attention Level: Selective;Alternating Memory: Decreased short-term memory Following Commands: Follows one step commands consistently;Follows multi-step commands inconsistently Safety/Judgement: Decreased awareness of deficits;Decreased awareness of safety   Problem Solving: Slow processing;Requires verbal cues General Comments: pt is slow to respond.  He was able to recall events of earlier therapies, but occasionally gets details mixed up.      Exercises      General Comments General comments (skin integrity, edema, etc.): able to cough up secretions and use suction to clear from mouth      Pertinent Vitals/Pain Pain Assessment: No/denies pain    Home Living  Prior Function            PT Goals (current goals can now be found in the care plan section) Acute Rehab PT Goals Patient Stated Goal: "I want to go home" PT Goal Formulation: With patient Time For Goal Achievement: 05/24/21 Potential to Achieve Goals: Good Progress towards PT goals: Progressing toward goals    Frequency    Min 3X/week      PT Plan Current plan remains appropriate    Co-evaluation               AM-PAC PT "6 Clicks" Mobility   Outcome Measure  Help needed turning from your back to your side while in a flat bed without using bedrails?: A Little Help needed moving from lying on your back to sitting on the side of a flat bed without using bedrails?: A Lot Help needed moving to and from a bed to a chair (including a wheelchair)?: A Lot Help needed standing up from a chair using your arms (e.g., wheelchair or bedside chair)?: A Lot Help needed to walk in hospital room?: A Lot Help needed climbing 3-5 steps with a railing? : Total 6 Click Score: 12    End of Session Equipment Utilized During Treatment: Gait belt Activity Tolerance: Patient tolerated treatment well Patient left: in chair;with call bell/phone within reach;with chair alarm set Nurse Communication: Mobility status;Need for lift equipment PT Visit Diagnosis: Muscle weakness (generalized) (M62.81);Difficulty in walking, not elsewhere classified (R26.2);Unsteadiness on feet (R26.81)     Time: 8032-1224 PT Time Calculation (min) (ACUTE ONLY): 29 min  Charges:  $Gait Training: 8-22 mins $Therapeutic Activity: 8-22 mins                     Roney Marion, PT  Acute Rehabilitation Services Pager [phone] Office [place] 05/13/2021, 2:14 PM

## 2021-05-13 NOTE — Procedures (Signed)
Cortrak  Person Inserting Tube:  Hayden Mcbride, RD Tube Type:  Cortrak - 43 inches Tube Location:  Right nare Initial Placement:  Stomach Secured by: Bridle Technique Used to Measure Tube Placement:  Documented cm marking at nare/ corner of mouth Cortrak Secured At:  67 cm    Cortrak Tube Team Note:  Consult received to place a Cortrak feeding tube.   X-ray is required, abdominal x-ray has been ordered by the Cortrak team. Please confirm tube placement before using the Cortrak tube.   If the tube becomes dislodged please keep the tube and contact the Cortrak team at [email or web address] (password [email or web address]) for replacement.  If after hours and replacement cannot be delayed, place a NG tube and confirm placement with an abdominal x-ray.    Hayden Mcbride RD, LDN Clinical Nutrition Pager listed in [place]

## 2021-05-13 NOTE — Progress Notes (Signed)
MD Gherghe at bedside, verbal order to this RN for prn tylenol, 650mg  for headache and mild pain q6h. Order to be placed at bedside with MD present.   Guillermo Nehring RN

## 2021-05-13 NOTE — Procedures (Signed)
Objective Swallowing Evaluation: Type of Study: FEES-Fiberoptic Endoscopic Evaluation of Swallow   Patient Details  Name: Hayden Mcbride MRN: [id number] Date of Birth: 30-Jul-1961  Today's Date: 05/13/2021 Time: SLP Start Time (ACUTE ONLY): 0920 -SLP Stop Time (ACUTE ONLY): 0950  SLP Time Calculation (min) (ACUTE ONLY): 30 min   Past Medical History:  Past Medical History:  Diagnosis Date  . Allergy   . Dry eyes   . GERD (gastroesophageal reflux disease)   . Glaucoma   . Hx of adenomatous colonic polyps 07/08/2006   06/2006 - diminutive adenoma 04/19/2015 - diminutive adenoma and one polyp lost - repeat colonoscopy 2021  . Hypertension   . Post-operative nausea and vomiting   . Sleep apnea    wears CPAP  . Thyroid disease    long ago- half thyroid removed ~20 yrs ago    Past Surgical History:  Past Surgical History:  Procedure Laterality Date  . BREATH TEK H PYLORI N/A 12/24/2014   Procedure: BREATH TEK H PYLORI;  Surgeon: Alphonsa Overall, MD;  Location: [facility];  Service: General;  Laterality: N/A;  . CHOLECYSTECTOMY    . COLONOSCOPY    . EYE SURGERY     age 60   . 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 POLYPECTOMY    . SHOULDER SURGERY     left shoulder  . 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 Recent return from 2 week trip to [place] early May.  Pt has persistent encephalopathy, slowly improving. ETT 517-5/27.  CXR 5/27 concerning for possible RLL pna.  MRI 5/24 c/w bacterial meningitis.  Prior hx: GERD, Glaucoma, Colon Polyps, HTN, OSA, Thyroid Disease   Subjective: Pt awake, alert, pleasant, participative    Assessment / Plan / Recommendation  CHL IP CLINICAL IMPRESSIONS 05/13/2021  Clinical Impression Pt demonstrates severe dysphagia though study very limited due to poor visualization of airway due to  narrow pharynx/protrusion of pharyngeal wall into pharyngeal space at level of epiglottis. Difficult to maintain a consistent view of the glottis though pt seen to have standing secretino in glottis that he could not clear. Pt have very little ability to protect airway during the swallow; epiglottis does not invert given pharyngeal wall impedance and there is compromised glottic closure with errythema and mild excresence on VF following prolonged intubation. After minimal PO given, nectar and honey teaspoon, pt had aspiration, residue, coughing attempts but repeated aspiration. A chin tuck was attempted with multiple swallows to clear without any success. Overall, pt is not ready for PO given his poor abiltiy to protect airway. Will plan to complete an MBS when pt shows clinical improvement at bedside. Recommend placement of Cortrak, reported to RD.  SLP Visit Diagnosis Dysphagia, oropharyngeal phase (R13.12)  Attention and concentration deficit following --  Frontal lobe and executive function deficit following --  Impact on safety and function Severe aspiration risk      CHL IP TREATMENT RECOMMENDATION 05/13/2021  Treatment Recommendations Therapy as outlined in treatment plan below     Prognosis 05/13/2021  Prognosis for Safe Diet Advancement Good  Barriers to Reach Goals Severity of deficits  Barriers/Prognosis Comment --    CHL IP DIET RECOMMENDATION 05/13/2021  SLP Diet Recommendations NPO;Alternative means - temporary  Liquid Administration via --  Medication Administration Via alternative means  Compensations --  Postural Changes --  CHL IP OTHER RECOMMENDATIONS 05/13/2021  Recommended Consults --  Oral Care Recommendations Oral care BID  Other Recommendations --      CHL IP FOLLOW UP RECOMMENDATIONS 05/13/2021  Follow up Recommendations Inpatient Rehab      CHL IP FREQUENCY AND DURATION 05/13/2021  Speech Therapy Frequency (ACUTE ONLY) min 2x/week  Treatment Duration 2  weeks           CHL IP ORAL PHASE 05/13/2021  Oral Phase WFL  Oral - Pudding Teaspoon --  Oral - Pudding Cup --  Oral - Honey Teaspoon --  Oral - Honey Cup --  Oral - Nectar Teaspoon --  Oral - Nectar Cup --  Oral - Nectar Straw --  Oral - Thin Teaspoon --  Oral - Thin Cup --  Oral - Thin Straw --  Oral - Puree --  Oral - Mech Soft --  Oral - Regular --  Oral - Multi-Consistency --  Oral - Pill --  Oral Phase - Comment --    CHL IP PHARYNGEAL PHASE 05/13/2021  Pharyngeal Phase Impaired  Pharyngeal- Pudding Teaspoon --  Pharyngeal --  Pharyngeal- Pudding Cup --  Pharyngeal --  Pharyngeal- Honey Teaspoon Reduced epiglottic inversion;Reduced airway/laryngeal closure;Delayed swallow initiation-pyriform sinuses;Penetration/Aspiration during swallow;Penetration/Apiration after swallow;Moderate aspiration;Pharyngeal residue - valleculae;Pharyngeal residue - pyriform;Inter-arytenoid space residue;Lateral channel residue;Compensatory strategies attempted (with notebox)  Pharyngeal Material enters airway, passes BELOW cords and not ejected out despite cough attempt by patient;Material enters airway, passes BELOW cords then ejected out  Pharyngeal- Honey Cup --  Pharyngeal --  Pharyngeal- Nectar Teaspoon Reduced epiglottic inversion;Reduced airway/laryngeal closure;Delayed swallow initiation-pyriform sinuses;Penetration/Aspiration during swallow;Penetration/Apiration after swallow;Moderate aspiration;Pharyngeal residue - valleculae;Pharyngeal residue - pyriform;Inter-arytenoid space residue;Lateral channel residue;Compensatory strategies attempted (with notebox)  Pharyngeal Material enters airway, passes BELOW cords and not ejected out despite cough attempt by patient;Material enters airway, passes BELOW cords then ejected out  Pharyngeal- Nectar Cup --  Pharyngeal --  Pharyngeal- Nectar Straw --  Pharyngeal --  Pharyngeal- Thin Teaspoon --  Pharyngeal --  Pharyngeal- Thin Cup --   Pharyngeal --  Pharyngeal- Thin Straw --  Pharyngeal --  Pharyngeal- Puree --  Pharyngeal --  Pharyngeal- Mechanical Soft --  Pharyngeal --  Pharyngeal- Regular --  Pharyngeal --  Pharyngeal- Multi-consistency --  Pharyngeal --  Pharyngeal- Pill --  Pharyngeal --  Pharyngeal Comment --     No flowsheet data found.  Herbie Baltimore, MA CCC-SLP  Acute Rehabilitation Services Pager [phone] Office [phone]  Lynann Beaver 05/13/2021, 11:36 AM

## 2021-05-13 NOTE — Progress Notes (Signed)
Subjective: No complaints   Antibiotics:  Anti-infectives (From admission, onward)   Start     Dose/Rate Route Frequency Ordered Stop   05/10/21 0500  linezolid (ZYVOX) IVPB 600 mg        600 mg 300 mL/hr over 60 Minutes Intravenous Every 12 hours 05/09/21 1050 05/17/21 0959   05/09/21 0500  vancomycin (VANCOREADY) IVPB 1500 mg/300 mL  Status:  Discontinued        1,500 mg 150 mL/hr over 120 Minutes Intravenous Every 12 hours 05/08/21 1353 05/09/21 1050   05/08/21 1500  vancomycin (VANCOREADY) IVPB 2000 mg/400 mL        2,000 mg 200 mL/hr over 120 Minutes Intravenous  Once 05/08/21 1353 05/08/21 1714   05/03/21 1215  penicillin G potassium 12 Million Units in dextrose 5 % 500 mL continuous infusion        12 Million Units 41.7 mL/hr over 12 Hours Intravenous Every 12 hours 05/03/21 1123 05/13/21 2359   05/01/21 2200  vancomycin (VANCOREADY) IVPB 1000 mg/200 mL  Status:  Discontinued        1,000 mg 200 mL/hr over 60 Minutes Intravenous Every 8 hours 05/01/21 1312 05/02/21 0902   05/01/21 1400  vancomycin (VANCOREADY) IVPB 1000 mg/200 mL        1,000 mg 200 mL/hr over 60 Minutes Intravenous NOW 05/01/21 1312 05/01/21 1523   04/30/21 1200  vancomycin (VANCOREADY) IVPB 1000 mg/200 mL  Status:  Discontinued        1,000 mg 200 mL/hr over 60 Minutes Intravenous Every 12 hours 04/29/21 2334 05/01/21 1312   04/30/21 0600  acyclovir (ZOVIRAX) 1,000 mg in dextrose 5 % 150 mL IVPB  Status:  Discontinued        1,000 mg 170 mL/hr over 60 Minutes Intravenous Every 8 hours 04/30/21 0243 04/30/21 1119   04/30/21 0000  ampicillin (OMNIPEN) 2 g in sodium chloride 0.9 % 100 mL IVPB  Status:  Discontinued        2 g 300 mL/hr over 20 Minutes Intravenous Every 4 hours 04/29/21 2330 05/01/21 0958   04/29/21 2345  cefTRIAXone (ROCEPHIN) 2 g in sodium chloride 0.9 % 100 mL IVPB  Status:  Discontinued        2 g 200 mL/hr over 30 Minutes Intravenous Every 12 hours 04/29/21 2330 05/03/21  1123   04/29/21 2345  vancomycin (VANCOREADY) IVPB 2000 mg/400 mL        2,000 mg 200 mL/hr over 120 Minutes Intravenous  Once 04/29/21 2334 04/30/21 0240      Medications: Scheduled Meds: . amiodarone  200 mg Per Tube BID  . Chlorhexidine Gluconate Cloth  6 each Topical Q0600  . doxazosin  2 mg Per Tube Daily  . feeding supplement (PROSource TF)  45 mL Per Tube QID  . free water  250 mL Per Tube Q6H  . heparin injection (subcutaneous)  5,000 Units Subcutaneous Q8H  . insulin aspart  0-20 Units Subcutaneous TID WC  . mouth rinse  15 mL Mouth Rinse BID  . sodium chloride flush  10-40 mL Intracatheter Q12H   Continuous Infusions: . dextrose 50 mL/hr at 05/12/21 2048  . feeding supplement (VITAL 1.5 CAL) 1,000 mL (05/13/21 1230)  . linezolid (ZYVOX) IV 600 mg (05/13/21 0927)  . penicillin g continuous IV infusion 12 Million Units (05/13/21 0913)   PRN Meds:.acetaminophen, bisacodyl, sodium chloride flush    Objective: Weight change:   Intake/Output Summary (Last 24 hours) at  05/13/2021 1447 Last data filed at 05/13/2021 1100 Gross per 24 hour  Intake --  Output 2935 ml  Net -2935 ml   Blood pressure 140/60, pulse 78, temperature 97.8 F (36.6 C), temperature source Axillary, resp. rate 20, height 5' 10.98" (1.803 m), weight 112.6 kg, SpO2 96 %. Temp:  [97.7 F (36.5 C)-97.8 F (36.6 C)] 97.8 F (36.6 C) (05/31 0319) Pulse Rate:  [78] 78 (05/30 2031) Resp:  [20] 20 (05/31 0319) BP: (139-140)/(52-60) 140/60 (05/31 0319) SpO2:  [96 %] 96 % (05/31 0319) Weight:  [112.6 kg] 112.6 kg (05/31 0500)  Physical Exam: Physical Exam Constitutional:      Appearance: He is well-developed. He is ill-appearing.     Interventions: He is intubated.  HENT:     Head: Normocephalic and atraumatic.  Eyes:     General:        Right eye: No discharge.        Left eye: No discharge.     Extraocular Movements: Extraocular movements intact.     Conjunctiva/sclera: Conjunctivae normal.   Cardiovascular:     Rate and Rhythm: Regular rhythm. Tachycardia present.     Heart sounds: No murmur heard. No gallop.   Pulmonary:     Effort: Pulmonary effort is normal. No respiratory distress. He is intubated.     Breath sounds: Normal breath sounds. No stridor. No wheezing or rhonchi.  Abdominal:     General: Bowel sounds are normal. There is no distension.     Palpations: Abdomen is soft. There is no mass.  Musculoskeletal:        General: Normal range of motion.     Cervical back: Normal range of motion and neck supple.  Skin:    General: Skin is warm and dry.     Findings: No erythema or rash.  Neurological:     General: No focal deficit present.     Mental Status: He is alert and oriented to person, place, and time.     Comments: Was fairly sleepy when I saw him but arousable and fully oriented  Psychiatric:        Mood and Affect: Mood normal.        Speech: Speech is delayed.        Cognition and Memory: Cognition normal.    CBC:    BMET Recent Labs    05/12/21 0059 05/13/21 0159  NA 142 138  K 3.7 4.0  CL 107 105  CO2 29 26  GLUCOSE 111* 105*  BUN 27* 17  CREATININE 0.76 0.84  CALCIUM 8.0* 8.0*     Liver Panel  Recent Labs    05/13/21 0159  PROT 5.5*  ALBUMIN 1.8*  AST 85*  ALT 131*  ALKPHOS 45  BILITOT 1.1       Sedimentation Rate No results for input(s): ESRSEDRATE in the last 72 hours. C-Reactive Protein No results for input(s): CRP in the last 72 hours.  Micro Results: Recent Results (from the past 720 hour(s))  SARS CORONAVIRUS 2 (TAT 6-24 HRS) Nasopharyngeal Nasopharyngeal Swab     Status: None   Collection Time: 04/29/21 10:57 PM   Specimen: Nasopharyngeal Swab  Result Value Ref Range Status   SARS Coronavirus 2 NEGATIVE NEGATIVE Final    Comment: (NOTE) SARS-CoV-2 target nucleic acids are NOT DETECTED.  The SARS-CoV-2 RNA is generally detectable in upper and lower respiratory specimens during the acute phase of  infection. Negative results do not preclude SARS-CoV-2 infection, do not rule  out co-infections with other pathogens, and should not be used as the sole basis for treatment or other patient management decisions. Negative results must be combined with clinical observations, patient history, and epidemiological information. The expected result is Negative.  Fact Sheet for Patients: SugarRoll.be  Fact Sheet for Healthcare Providers: https://www.woods-mathews.com/  This test is not yet approved or cleared by the [place] FDA and  has been authorized for detection and/or diagnosis of SARS-CoV-2 by FDA under an Emergency Use Authorization (EUA). This EUA will remain  in effect (meaning this test can be used) for the duration of the COVID-19 declaration under Se ction 564(b)(1) of the Act, 21 U.S.C. section 360bbb-3(b)(1), unless the authorization is terminated or revoked sooner.  Performed at [facility], [place] [address], [place], [place] [address]   CSF culture     Status: None   Collection Time: 04/30/21  2:57 AM   Specimen: CSF; Cerebrospinal Fluid  Result Value Ref Range Status   Specimen Description CSF  Final   Special Requests Normal  Final   Gram Stain   Final    GRAM POSITIVE COCCI WBC PRESENT, PREDOMINANTLY MONONUCLEAR CRITICAL RESULT CALLED TO, READ BACK BY AND VERIFIED WITH: RN MEGAN PLUMMER BY MESSAN H. AT 0345 ON 5 18 2022 CYTOSPIN SMEAR Performed at [facility], [place] [address] [place], [place] [address]    Culture FEW STREPTOCOCCUS PNEUMONIAE  Final   Report Status 05/02/2021 FINAL  Final   Organism ID, Bacteria STREPTOCOCCUS PNEUMONIAE  Final      Susceptibility   Streptococcus pneumoniae - MIC*    ERYTHROMYCIN <=0.12 SENSITIVE Sensitive     LEVOFLOXACIN 0.5 SENSITIVE Sensitive     VANCOMYCIN 0.5 SENSITIVE Sensitive     PENICILLIN (meningitis) <=0.06 SENSITIVE Sensitive     PENO - penicillin <=0.06       PENICILLIN (non-meningitis) <=0.06 SENSITIVE Sensitive     PENICILLIN (oral) <=0.06 SENSITIVE Sensitive     CEFTRIAXONE (non-meningitis) <=0.12 SENSITIVE Sensitive     CEFTRIAXONE (meningitis) <=0.12 SENSITIVE Sensitive     * FEW STREPTOCOCCUS PNEUMONIAE  Culture, fungus without smear     Status: None (Preliminary result)   Collection Time: 04/30/21  2:57 AM   Specimen: CSF; Cerebrospinal Fluid  Result Value Ref Range Status   Specimen Description CSF  Final   Special Requests NONE  Final   Culture   Final    NO FUNGUS ISOLATED AFER 12 DAYS Performed at [facility], [address], [place], [place] [address]    Report Status PENDING  Incomplete  MRSA PCR Screening     Status: Abnormal   Collection Time: 04/30/21  5:17 AM   Specimen: Nasal Mucosa; Nasopharyngeal  Result Value Ref Range Status   MRSA by PCR POSITIVE (A) NEGATIVE Final    Comment:        The GeneXpert MRSA Assay (FDA approved for NASAL specimens only), is one component of a comprehensive MRSA colonization surveillance program. It is not intended to diagnose MRSA infection nor to guide or monitor treatment for MRSA infections. RESULT CALLED TO, READ BACK BY AND VERIFIED WITH: PLUMBER,M RN 04/30/2021 AT 4034 SKEEN,P Performed at [facility], [place] [address] [place],  [address]   HSV 1/2 Ab IgG/IgM CSF     Status: None   Collection Time: 04/30/21  5:27 AM   Specimen: Cerebrospinal Fluid  Result Value Ref Range Status   HSV 1/2 Ab, IgM, CSF 0.66 <=0.89 IV  Final    Comment: (NOTE) INTERPRETIVE INFORMATION: Herpes Simplex Virus                          Type 1 and/or 2 Antibodies,                          IgM by ELISA, CSF  0.89 IV or Less .......... Negative: No significant                             level of detectable HSV IgM                             antibody.  0.90 - 1.09 IV ........... Equivocal: Questionable                             presence of IgM antibodies.                              Repeat testing in 10-14 days                             may be helpful.  1.10 IV or Greater ....... Positive: IgM antibody to HSV                             detected, which may indicate a                             current or recent infection.                             However, low levels of IgM                             antibodies may occasionally                             persist for more than 12                             months post-infection. The detection of antibodies to herpes simplex virus in CSF may indicate central nervou s system infection. However, consideration must be given to possible contamination by blood or transfer of serum antibodies across the blood-brain barrier. Fourfold or greater rise in CSF antibodies to herpes on specimens at least 4 weeks apart are found in 74-94 % of patients with herpes encephalitis. Specificity of the test based on a single CSF testing is not established. Presently PCR is the primary means of establishing a diagnosis of herpes encephalitis. This test was developed and its performance characteristics determined by [employer]. It has not been cleared or approved by the [place] Food and Drug Administration. This test was performed in a CLIA certified laboratory and is intended for clinical purposes.    HSV 1/2 Ab Screen IgG, CSF <0.34 <=0.89 IV Final    Comment: (NOTE) INTERPRETIVE INFORMATION: Herpes Simplex  Virus Type 1 and/or 2                    Antibodies, IgG CSF  0.89 IV or Less .......... Negative: No significant                             level of detectable HSV IgG                             antibody.  0.90 - 1.09 IV ........... Equivocal: Questionable                             presence of IgG antibodies.                             Repeat testing in 10-14 days                             may be helpful.  1.10 IV or Greater ....... Positive: IgG antibody to HSV                              detected, which may indicate                             a current or past HSV                             infection. The detection of antibodies to herpes simplex virus in CSF may indicate central nervous system infection. However, consideration must be given to possible contamination by blood or transfer of serum antibodies across the blood-brain barrier. Fourfold or greater rise in CSF antibodies to herpes on specimens at l east 4 weeks apart are found in 74-94 % of patients with herpes encephalitis. Specificity of the test based on a single CSF testing is not established. Presently PCR is the primary means of establishing a diagnosis of herpes encephalitis. This test was developed and its performance characteristics determined by [employer]. It has not been cleared or approved by the [place] Food and Drug Administration. This test was performed in a CLIA certified laboratory and is intended for clinical purposes. Performed At: [facility] [address] [place], [place] [id number] Hilton Sinclair I MD [phone]   Culture, blood (Routine X 2) w Reflex to ID Panel     Status: None   Collection Time: 04/30/21 11:30 AM   Specimen: BLOOD  Result Value Ref Range Status   Specimen Description BLOOD LEFT ANTECUBITAL  Final   Special Requests   Final    BOTTLES DRAWN AEROBIC AND ANAEROBIC Blood Culture adequate volume   Culture   Final    NO GROWTH 5 DAYS Performed at [facility], [place] [address], [place], [place] [address]    Report Status 05/05/2021 FINAL  Final  Culture, blood (Routine X 2) w Reflex to ID Panel     Status: None   Collection Time: 04/30/21 11:47 AM   Specimen: BLOOD LEFT FOREARM  Result Value Ref Range Status   Specimen Description BLOOD LEFT FOREARM  Final  Special Requests   Final    BOTTLES DRAWN AEROBIC ONLY Blood Culture results may not be optimal due to an inadequate volume of blood received in culture bottles   Culture   Final     NO GROWTH 5 DAYS Performed at [facility], [place] [address] [place], [place] [address]    Report Status 05/05/2021 FINAL  Final  Culture, Respiratory w Gram Stain     Status: None   Collection Time: 05/07/21 11:58 AM   Specimen: Tracheal Aspirate; Respiratory  Result Value Ref Range Status   Specimen Description TRACHEAL ASPIRATE  Final   Special Requests Immunocompromised  Final   Gram Stain   Final    ABUNDANT WBC PRESENT, PREDOMINANTLY PMN ABUNDANT GRAM POSITIVE COCCI RARE YEAST Performed at [facility], [address], [place], [place] [address]    Culture   Final    MODERATE METHICILLIN RESISTANT STAPHYLOCOCCUS AUREUS CORRECTED ON 05/27 AT 5638: PREVIOUSLY REPORTED AS MODERATE STAPHYLOCOCCUS AUREUS   Report Status 05/09/2021 FINAL  Final   Organism ID, Bacteria METHICILLIN RESISTANT STAPHYLOCOCCUS AUREUS  Final      Susceptibility   Methicillin resistant staphylococcus aureus - MIC*    CIPROFLOXACIN >=8 RESISTANT Resistant     ERYTHROMYCIN >=8 RESISTANT Resistant     GENTAMICIN <=0.5 SENSITIVE Sensitive     OXACILLIN >=4 RESISTANT Resistant     TETRACYCLINE <=1 SENSITIVE Sensitive     VANCOMYCIN 1 SENSITIVE Sensitive     TRIMETH/SULFA >=320 RESISTANT Resistant     CLINDAMYCIN <=0.25 SENSITIVE Sensitive     RIFAMPIN <=0.5 SENSITIVE Sensitive     Inducible Clindamycin NEGATIVE Sensitive     * MODERATE METHICILLIN RESISTANT STAPHYLOCOCCUS AUREUS CORRECTED ON 05/27 AT 7564: PREVIOUSLY REPORTED AS MODERATE STAPHYLOCOCCUS AUREUS  Culture, blood (Routine X 2) w Reflex to ID Panel     Status: None   Collection Time: 05/07/21  3:50 PM   Specimen: BLOOD  Result Value Ref Range Status   Specimen Description BLOOD LEFT ANTECUBITAL  Final   Special Requests   Final    BOTTLES DRAWN AEROBIC AND ANAEROBIC Blood Culture results may not be optimal due to an inadequate volume of blood received in culture bottles   Culture   Final    NO GROWTH 5 DAYS Performed at [facility], [place] [address], [place], [place] [address]    Report Status 05/12/2021 FINAL  Final  Culture, blood (Routine X 2) w Reflex to ID Panel     Status: None   Collection Time: 05/07/21  3:57 PM   Specimen: BLOOD  Result Value Ref Range Status   Specimen Description BLOOD LEFT ANTECUBITAL  Final   Special Requests   Final    BOTTLES DRAWN AEROBIC AND ANAEROBIC Blood Culture adequate volume   Culture   Final    NO GROWTH 5 DAYS Performed at [facility], [place] [address], [place], [place] [address]    Report Status 05/12/2021 FINAL  Final    Studies/Results: DG CHEST PORT 1 VIEW  Result Date: 05/12/2021 CLINICAL DATA:  60 year old male with NG coming up. EXAM: PORTABLE CHEST 1 VIEW COMPARISON:  Chest radiograph dated 05/11/2021. FINDINGS: No enteric tube identified. Right-sided PICC with tip close to the cavoatrial junction. Faint bilateral densities, likely atelectasis. Atypical infection is not excluded. No focal consolidation, pleural effusion, or pneumothorax. Stable cardiac silhouette. No acute osseous pathology. Postsurgical changes of left thyroidectomy. There is deviation of the upper trachea to  the left secondary to right thyroid nodule seen on the CT of 05/10/2021. IMPRESSION: 1. No enteric tube identified. 2. Faint bilateral pulmonary densities. Electronically Signed   By: Anner Crete M.D.   On: 05/12/2021 15:51   DG Abd Portable 1V  Result Date: 05/13/2021 CLINICAL DATA:  Feeding tube placement. EXAM: PORTABLE ABDOMEN - 1 VIEW COMPARISON:  Chest radiograph 05/12/2021 FINDINGS: A feeding tube has been placed and terminates in the expected region of the mid gastric body. A right PICC remains in place. Lung volumes are mildly low without evidence of airspace consolidation, edema, a sizable pleural effusion, or pneumothorax. Only the uppermost portion of the abdomen was included on this study. IMPRESSION: Feeding tube placement as above. Electronically Signed   By: Logan Bores M.D.   On: 05/13/2021 11:15      Assessment/Plan:  INTERVAL HISTORY:  He has been extubated and transferred out of the ICU to the hospitalist service.  His leukocytosis is resolving  Principal Problem:   Pneumococcal meningitis Active Problems:   Encounter for orogastric tube placement   AMS (altered mental status)   Meningitis   Acute respiratory failure (HCC)   Status post peripherally inserted central catheter (PICC) central line placement   FUO (fever of unknown origin)   Staphylococcus aureus pneumonia (HCC)    Hayden Mcbride is a 60 y.o. male with severe pneumococcal meningitis  #`1 Pneumococcal meningitis:   He will be completing his 14 days of effective treatment for pneumococcal meningitis a day   #2 VAP with MRSA : To complete 7 days of treatment  #3 fevers: These have disappeared now and leukocytosis is resolving.  I spent greater than 35  minutes with the patient including greater than 50% of time in face to face counsel of the patient during his radiographs personally his laboratory microbiological data and in coordination of his care.  I will sign off for now please call with further questions.   LOS: 13 days   Alcide Evener 05/13/2021, 2:47 PM

## 2021-05-13 NOTE — Progress Notes (Addendum)
PROGRESS NOTE  Hayden Mcbride:[id number] DOB: 11-27-1961 DOA: 04/29/2021 PCP: Robyne Peers, MD   LOS: 13 days   Brief Narrative / Interim history: 60 year old male with hypertension, OSA, unspecified thyroid disease, comes into the hospital and is admitted on 5/17 due to sudden onset of headache, hearing loss and combativeness/confusion.  He recently returned from a 2-week trip to [place].  He was intubated in the ED and was admitted to the ICU.  Further work-up with an LP showed evidence of having pneumococcal meningitis.  ID consulted.  He was eventually extubated and transferred to the hospitalist service on 5/30.  Significant events 5/17 presented to ED, intubated for agitation and airway protection. Ampicillin, vanc & rocephin started.  5/18 CSF culture and gram stain>> S. Pneumoniae >> pan sensitive 5/18 EEG w/right hemispheric cortical dysfunction, no evidence of seizures. 5/19 amp stopped 5/20 vanc stopped 5/21 Penicillin G Continuous Infusion 5/21 >> 5/23 CT head no evidence acute hemorrhage, infarct, extra-axial collection. Some increased density dural venous sinuses, no clear evidence of clot MRI/V 5/24 >> numerous foci of restricted diffusion around the subarachnoid spaces consistent with ongoing bacterial meningitis small amount restricted diffusion in the fourth ventricle but no evidence of wide spread ventriculitis, no evidence for venous thrombosis 5/26 Slow improvement in neuro status, WBC 36, fever / cultures repeated, on precedex. vanc started 5/27 Sputum culture positive for MRSA, losartan held with rise in cr. Extubated. 528 required BIPAP early am hours 5/30 transfer to [facility]  Subjective / 24h Interval events: More alert and appropriate this morning.  Alert to place, year.  Complains of a mild headache.  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  Continue high-dose penicillin, appreciate ID follow-up  Active Problems MRSA pneumonia-he was having fevers, cultured and tracheal aspirate cultures on 5/25 showed MRSA.  He was initially on vancomycin but transition to Zyvox because of an elevation in creatinine.  Respiratory status is stable today  Acute hypoxic respiratory failure, ventilator dependent respiratory failure -improving, on nasal cannula.  Attempt to wean off to room air as tolerated  Paroxysmal A. fib-apparently had an episode of A. fib and he was placed on amiodarone. Currently he is in sinus rhythm.  He has not been anticoagulated due to bacterial meningitis and high risk of ICH.  His A. fib is likely in the setting of acute illness, if he remains in sinus and does not have recurrence can probably avoid anticoagulation altogether.  His CHA2DS2-VASc score is 1 for hypertension.  2D echo done on 5/27 showed an EF of 65-70%, normal LVEF without WMA, normal RV.  Acute toxic encephalopathy -due to #1, supportive care.  Speech to see today, he is much more alert I anticipate him being able to eat  Thyroid disease-mentioned in the chart however TSH unremarkable.  Acute kidney injury-on 5/27 creatinine acutely increased to 1.6, possibly due to vancomycin.  Creatinine is now normal  Hypernatremia-resolved, DC IV fluids once cleared to eat by speech  Leukocytosis-overall improving  LFT elevation-overall stable for the past week, mild, I wonder whether this is related to penicillin/amiodarone.  Monitor once off penicillin per ID  Hypertension-Home medications include amlodipine 5 mg, losartan 100 mg.  Blood pressure is normal here, monitor  Hypokalemia-monitor and replete as necessary  Acute urinary retention-failed voiding trial on 5/27, he was placed on doxazosin, continue, once more awake will  give another voiding trial  Obesity -BMI 33  Scheduled Meds: . amiodarone  200 mg Per Tube  BID  . Chlorhexidine Gluconate Cloth  6 each Topical Q0600  . doxazosin  2 mg Per Tube Daily  . feeding supplement (PROSource TF)  45 mL Per Tube QID  . free water  250 mL Per Tube Q6H  . heparin injection (subcutaneous)  5,000 Units Subcutaneous Q8H  . insulin aspart  0-20 Units Subcutaneous TID WC  . mouth rinse  15 mL Mouth Rinse BID  . sodium chloride flush  10-40 mL Intracatheter Q12H   Continuous Infusions: . dextrose 50 mL/hr at 05/12/21 2048  . feeding supplement (VITAL 1.5 CAL) Stopped (05/12/21 1131)  . linezolid (ZYVOX) IV 600 mg (05/13/21 0927)  . penicillin g continuous IV infusion 12 Million Units (05/13/21 0913)   PRN Meds:.acetaminophen, bisacodyl, sodium chloride flush  Diet Orders (From admission, onward)    Start     Ordered   05/12/21 1230  Diet NPO time specified Except for: Other (See Comments)  Diet effective now       Comments: Meds whole in puree as needed  Question:  Except for  Answer:  Other (See Comments)   05/12/21 1229          DVT prophylaxis: heparin injection 5,000 Units Start: 05/05/21 2200 SCDs Start: 04/30/21 0120     Code Status: Full Code  Family Communication: no family at bedside   Status is: Inpatient  Remains inpatient appropriate because:Altered mental status and Inpatient level of care appropriate due to severity of illness   Dispo: The patient is from: Home              Anticipated d/c is to: TBD              Patient currently is not medically stable to d/c.   Difficult to place patient No   Level of care: Telemetry Cardiac  Consultants:  PCCM ID  Procedures:  2D echo LP  Microbiology  CSF culture Streptococcus pneumonia Tracheal aspirate MRSA  Antimicrobials: Penicillin 5/21 >> Vancomycin 5/26 >> 5/28 Linezolid 5/28 >>  Objective: Vitals:   05/12/21 1441 05/12/21 2031 05/13/21 0319 05/13/21 0500  BP: (!) 150/65 (!) 139/52 140/60   Pulse: 79 78    Resp:  20 20   Temp: 98.5 F (36.9 C) 97.7 F (36.5  C) 97.8 F (36.6 C)   TempSrc: Oral  Axillary   SpO2: 98% 96% 96%   Weight:    112.6 kg  Height:        Intake/Output Summary (Last 24 hours) at 05/13/2021 1660 Last data filed at 05/13/2021 0957 Gross per 24 hour  Intake --  Output 2675 ml  Net -2675 ml   Filed Weights   05/10/21 0500 05/11/21 0500 05/13/21 0500  Weight: 109.7 kg 108.5 kg 112.6 kg    Examination:  Constitutional: No distress, alert Eyes: No scleral icterus ENMT: Moist mucous membranes Neck: normal, supple Respiratory: Diminished at the bases but overall clear, no wheezing Cardiovascular: Regular rate and rhythm, no murmurs, no peripheral edema Abdomen: Soft, nontender, nondistended, bowel sounds positive Musculoskeletal: no clubbing / cyanosis.  Skin: No new rashes Neurologic: No focal deficits Psychiatric: Alert and oriented x3  Data Reviewed: I have independently reviewed following labs and imaging studies   CBC: Recent Labs  Lab 05/09/21 0409 05/10/21 0500 05/11/21 0349 05/12/21 0059 05/13/21 0159  WBC 39.7* 31.6* 22.7* 16.8* 15.4*  HGB 16.0 15.0 13.8 13.7  13.8  HCT 49.4 46.3 42.2 41.6 41.9  MCV 98.2 97.3 97.7 96.7 96.5  PLT 188 168 167 175 938   Basic Metabolic Panel: Recent Labs  Lab 05/07/21 0359 05/07/21 2355 05/09/21 0409 05/10/21 0500 05/11/21 0349 05/12/21 0059 05/13/21 0159  NA 132*   < > 146* 148* 145 142 138  K 4.3   < > 3.9 3.3* 3.2* 3.7 4.0  CL 97*   < > 113* 108 111 107 105  CO2 27   < > 27 31 30 29 26   GLUCOSE 213*   < > 157* 203* 142* 111* 105*  BUN 41*   < > 90* 56* 36* 27* 17  CREATININE 0.80   < > 1.57* 0.96 0.70 0.76 0.84  CALCIUM 6.8*   < > 7.6* 7.6* 7.8* 8.0* 8.0*  MG 2.2  --  2.8*  --   --  2.1 2.0  PHOS 3.2  --   --   --   --  3.1  --    < > = values in this interval not displayed.   Liver Function Tests: Recent Labs  Lab 05/10/21 0500 05/13/21 0159  AST 82* 85*  ALT 86* 131*  ALKPHOS 36* 45  BILITOT 0.5 1.1  PROT 4.7* 5.5*  ALBUMIN 1.7* 1.8*    Coagulation Profile: No results for input(s): INR, PROTIME in the last 168 hours. HbA1C: No results for input(s): HGBA1C in the last 72 hours. CBG: Recent Labs  Lab 05/12/21 1203 05/12/21 1641 05/13/21 0045 05/13/21 0332 05/13/21 0812  GLUCAP 126* 112* 99 122* 94    Recent Results (from the past 240 hour(s))  Culture, Respiratory w Gram Stain     Status: None   Collection Time: 05/07/21 11:58 AM   Specimen: Tracheal Aspirate; Respiratory  Result Value Ref Range Status   Specimen Description TRACHEAL ASPIRATE  Final   Special Requests Immunocompromised  Final   Gram Stain   Final    ABUNDANT WBC PRESENT, PREDOMINANTLY PMN ABUNDANT GRAM POSITIVE COCCI RARE YEAST Performed at [facility], [address] [place], [place] [address]    Culture   Final    MODERATE METHICILLIN RESISTANT STAPHYLOCOCCUS AUREUS CORRECTED ON 05/27 AT 1025: PREVIOUSLY REPORTED AS MODERATE STAPHYLOCOCCUS AUREUS   Report Status 05/09/2021 FINAL  Final   Organism ID, Bacteria METHICILLIN RESISTANT STAPHYLOCOCCUS AUREUS  Final      Susceptibility   Methicillin resistant staphylococcus aureus - MIC*    CIPROFLOXACIN >=8 RESISTANT Resistant     ERYTHROMYCIN >=8 RESISTANT Resistant     GENTAMICIN <=0.5 SENSITIVE Sensitive     OXACILLIN >=4 RESISTANT Resistant     TETRACYCLINE <=1 SENSITIVE Sensitive     VANCOMYCIN 1 SENSITIVE Sensitive     TRIMETH/SULFA >=320 RESISTANT Resistant     CLINDAMYCIN <=0.25 SENSITIVE Sensitive     RIFAMPIN <=0.5 SENSITIVE Sensitive     Inducible Clindamycin NEGATIVE Sensitive     * MODERATE METHICILLIN RESISTANT STAPHYLOCOCCUS AUREUS CORRECTED ON 05/27 AT 8527: PREVIOUSLY REPORTED AS MODERATE STAPHYLOCOCCUS AUREUS  Culture, blood (Routine X 2) w Reflex to ID Panel     Status: None   Collection Time: 05/07/21  3:50 PM   Specimen: BLOOD  Result Value Ref Range Status   Specimen Description BLOOD LEFT ANTECUBITAL  Final   Special Requests   Final    BOTTLES DRAWN  AEROBIC AND ANAEROBIC Blood Culture results may not be optimal due to an inadequate volume of blood received in culture bottles  Culture   Final    NO GROWTH 5 DAYS Performed at [facility], [place] [address], [place], [place] [address]    Report Status 05/12/2021 FINAL  Final  Culture, blood (Routine X 2) w Reflex to ID Panel     Status: None   Collection Time: 05/07/21  3:57 PM   Specimen: BLOOD  Result Value Ref Range Status   Specimen Description BLOOD LEFT ANTECUBITAL  Final   Special Requests   Final    BOTTLES DRAWN AEROBIC AND ANAEROBIC Blood Culture adequate volume   Culture   Final    NO GROWTH 5 DAYS Performed at [facility], [place] [address] [place], [place] [address]    Report Status 05/12/2021 FINAL  Final     Radiology Studies: DG CHEST PORT 1 VIEW  Result Date: 05/12/2021 CLINICAL DATA:  61 year old male with NG coming up. EXAM: PORTABLE CHEST 1 VIEW COMPARISON:  Chest radiograph dated 05/11/2021. FINDINGS: No enteric tube identified. Right-sided PICC with tip close to the cavoatrial junction. Faint bilateral densities, likely atelectasis. Atypical infection is not excluded. No focal consolidation, pleural effusion, or pneumothorax. Stable cardiac silhouette. No acute osseous pathology. Postsurgical changes of left thyroidectomy. There is deviation of the upper trachea to the left secondary to right thyroid nodule seen on the CT of 05/10/2021. IMPRESSION: 1. No enteric tube identified. 2. Faint bilateral pulmonary densities. Electronically Signed   By: Anner Crete M.D.   On: 05/12/2021 15:51    Marzetta Board, MD, PhD [facility]  Between 7 am - 7 pm I am available, please contact me via Amion (for emergencies) or Securechat (non urgent messages)  Between 7 pm - 7 am I am not available, please contact night coverage MD/APP via Amion

## 2021-05-13 NOTE — Progress Notes (Signed)
Inpatient Rehab Admissions Coordinator:   Pt. Is not medically ready for CIR at this time. I will continue to follow and submit to insurance once pt. Is medically ready.   Clemens Catholic, [place], [place] Admissions Coordinator  [phone] ([place]) [phone] (office)

## 2021-05-14 ENCOUNTER — Inpatient Hospital Stay (HOSPITAL_COMMUNITY): Payer: Commercial Managed Care - PPO

## 2021-05-14 LAB — GLUCOSE, CAPILLARY
Glucose-Capillary: 119 mg/dL — ABNORMAL HIGH (ref 70–99)
Glucose-Capillary: 119 mg/dL — ABNORMAL HIGH (ref 70–99)
Glucose-Capillary: 125 mg/dL — ABNORMAL HIGH (ref 70–99)
Glucose-Capillary: 99 mg/dL (ref 70–99)
Glucose-Capillary: 133 mg/dL — ABNORMAL HIGH (ref 70–99)
Glucose-Capillary: 114 mg/dL — ABNORMAL HIGH (ref 70–99)

## 2021-05-14 LAB — CBC
HCT: 39.6 % (ref 39.0–52.0)
Hemoglobin: 13.3 g/dL (ref 13.0–17.0)
MCH: 31.7 pg (ref 26.0–34.0)
MCHC: 33.6 g/dL (ref 30.0–36.0)
MCV: 94.3 fL (ref 80.0–100.0)
Platelets: 205 10*3/uL (ref 150–400)
RBC: 4.2 MIL/uL — ABNORMAL LOW (ref 4.22–5.81)
RDW: 12.8 % (ref 11.5–15.5)
WBC: 13.2 10*3/uL — ABNORMAL HIGH (ref 4.0–10.5)
nRBC: 0 % (ref 0.0–0.2)

## 2021-05-14 LAB — COMPREHENSIVE METABOLIC PANEL
ALT: 148 U/L — ABNORMAL HIGH (ref 0–44)
AST: 75 U/L — ABNORMAL HIGH (ref 15–41)
Albumin: 1.8 g/dL — ABNORMAL LOW (ref 3.5–5.0)
Alkaline Phosphatase: 48 U/L (ref 38–126)
Anion gap: 9 (ref 5–15)
BUN: 14 mg/dL (ref 8–23)
CO2: 25 mmol/L (ref 22–32)
Calcium: 8.2 mg/dL — ABNORMAL LOW (ref 8.9–10.3)
Chloride: 103 mmol/L (ref 98–111)
Creatinine, Ser: 0.69 mg/dL (ref 0.61–1.24)
GFR, Estimated: >60 mL/min (ref >60)
Glucose, Bld: 123 mg/dL — ABNORMAL HIGH (ref 70–99)
Potassium: 4.1 mmol/L (ref 3.5–5.1)
Sodium: 137 mmol/L (ref 135–145)
Total Bilirubin: 0.7 mg/dL (ref 0.3–1.2)
Total Protein: 5.5 g/dL — ABNORMAL LOW (ref 6.5–8.1)

## 2021-05-14 IMAGING — US US ABDOMEN LIMITED
1 series · 14 of 24 positions shown · non-contrast
Comparison: None.

CLINICAL DATA: 60-year-old male with elevated LFTs.

EXAM:
ULTRASOUND ABDOMEN LIMITED RIGHT UPPER QUADRANT

[Series 1: us abdomen limited ruq (liver/gb) · 14 of 24 slices shown]
[im 1/24]
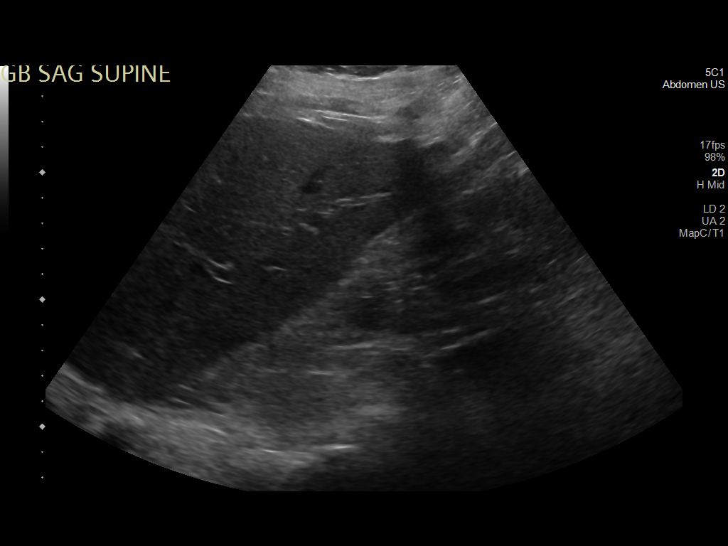
[im 3/24]
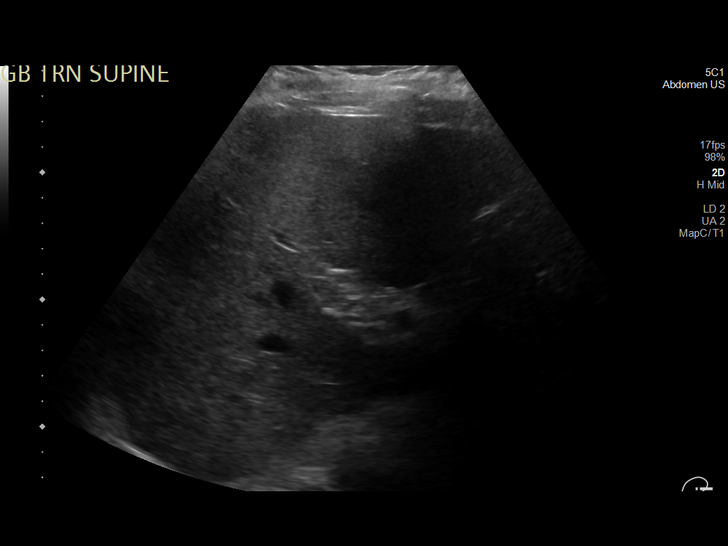
[im 5/24]
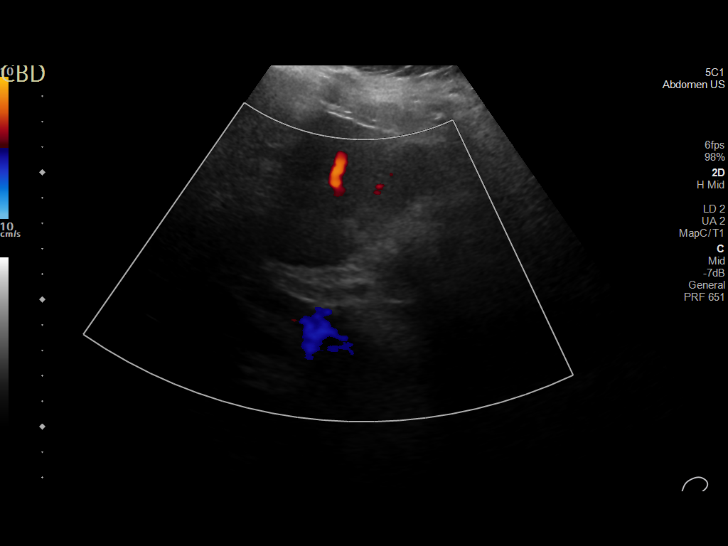
[im 7/24]
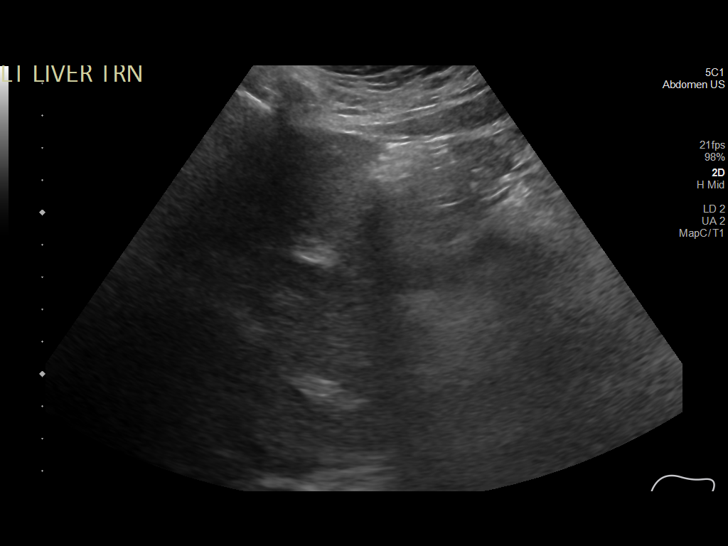
[im 8/24]
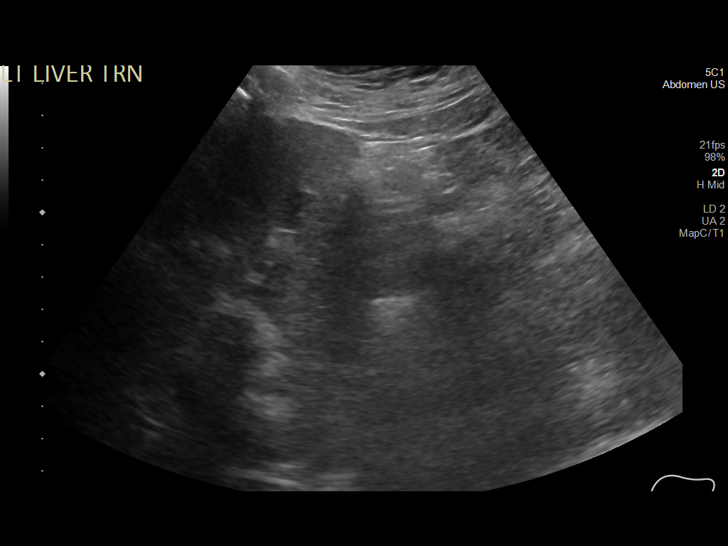
[im 10/24]
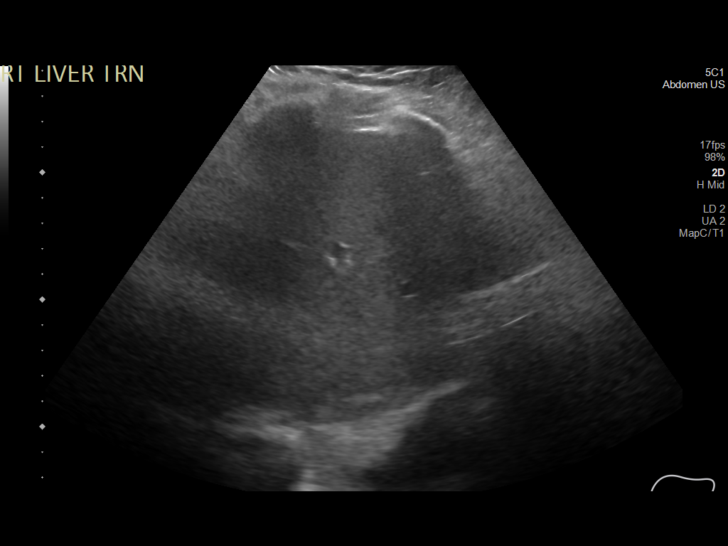
[im 12/24]
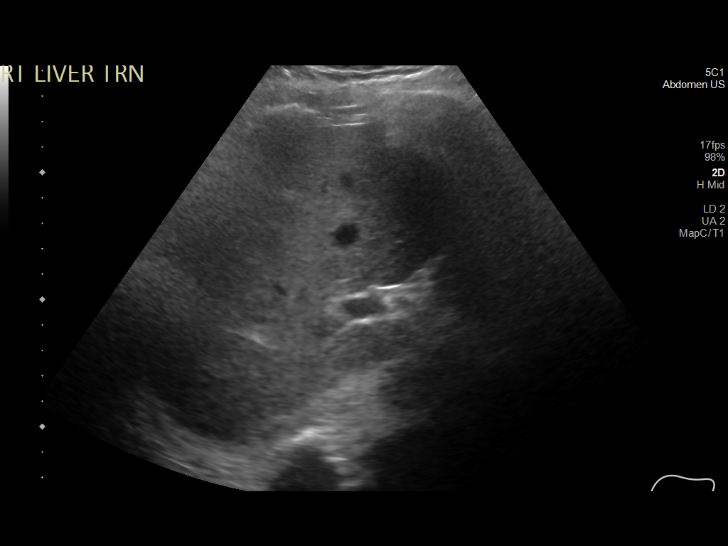
[im 13/24]
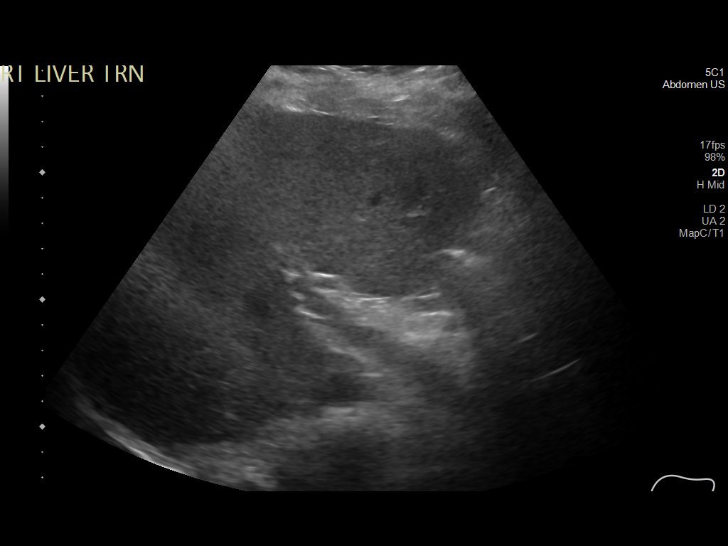
[im 15/24]
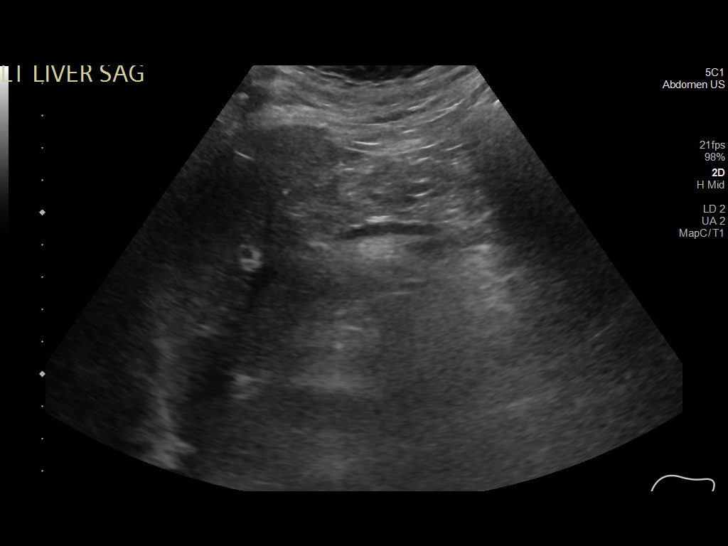
[im 17/24]
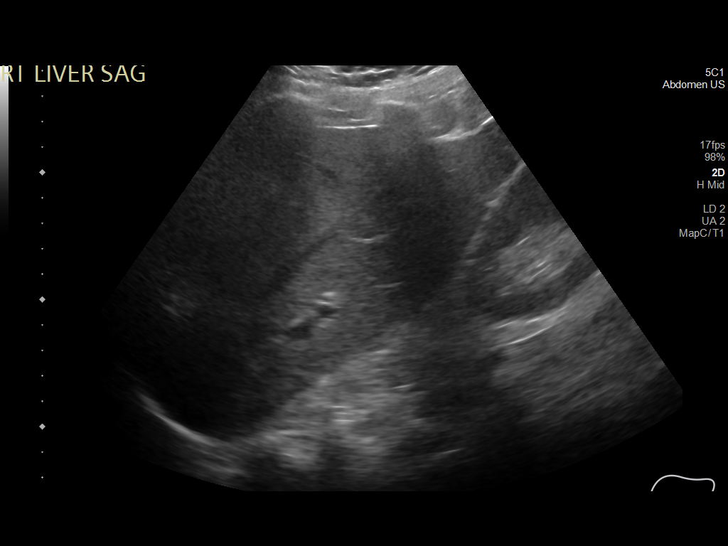
[im 19/24]
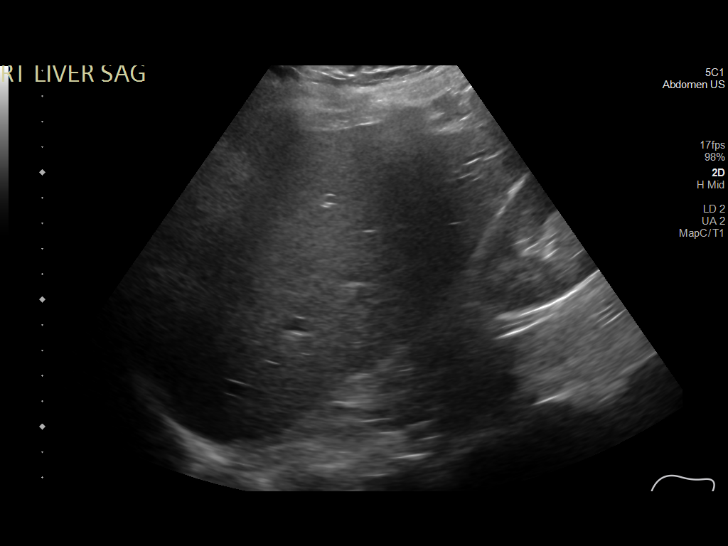
[im 20/24]
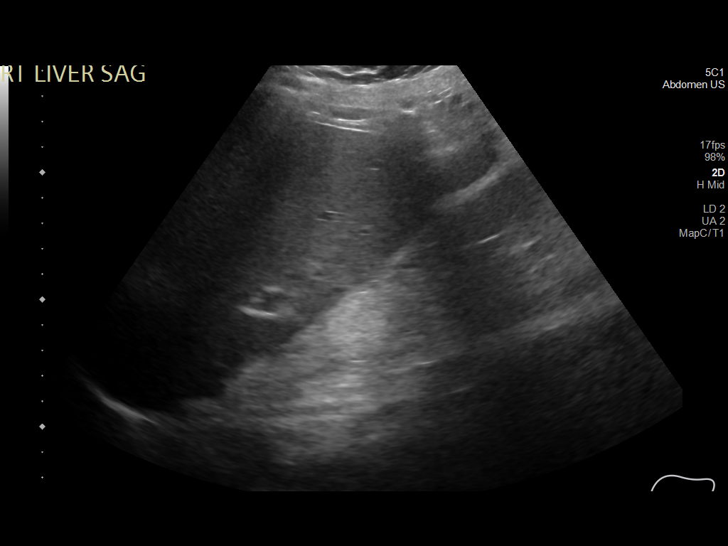
[im 22/24]
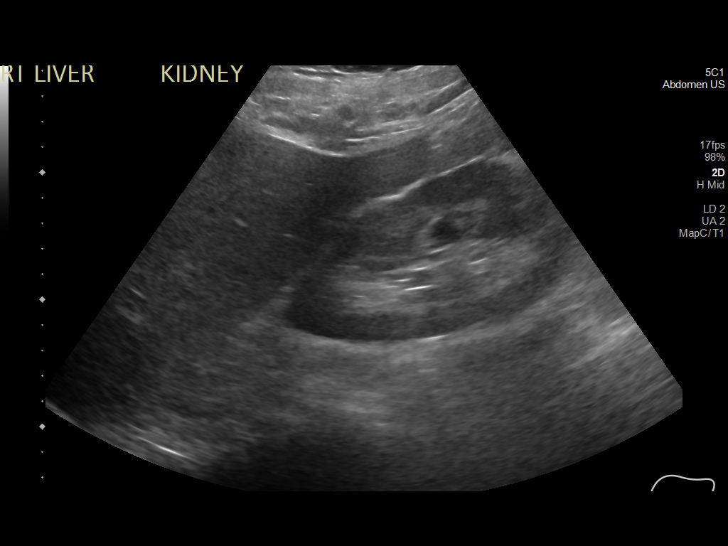
[im 24/24]
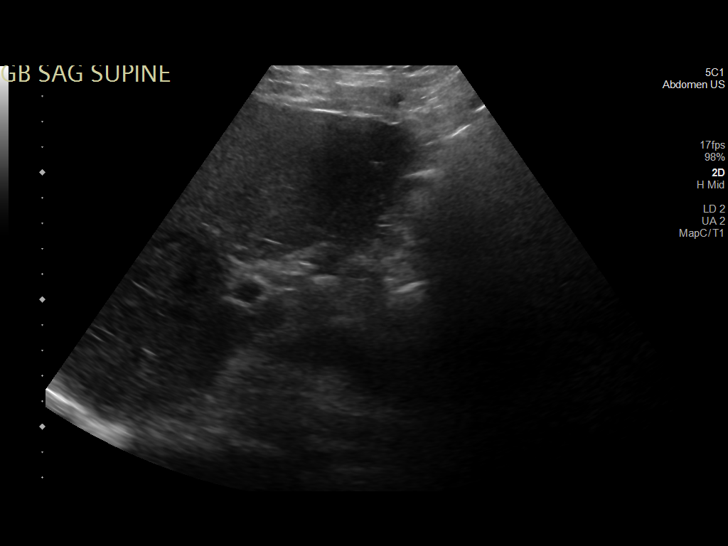

[14 of 24 positions shown; findings below may reference images not displayed]

FINDINGS: Gallbladder:

Cholecystectomy.

Common bile duct:

Diameter: 5 mm

Liver:

No focal lesion identified. Within normal limits in parenchymal
echogenicity. Portal vein is patent on color Doppler imaging with
normal direction of blood flow towards the liver.

Other: None.
IMPRESSION: Cholecystectomy, otherwise unremarkable right upper quadrant
ultrasound.

## 2021-05-14 NOTE — Progress Notes (Signed)
Inpatient Rehab Admissions Coordinator:   Pt. More interactive today, appears appropriate for CIR. I opened a case for insurance auth this [place], [place], [place] Admissions Coordinator  [phone] (celll) [phone] (office)

## 2021-05-14 NOTE — Progress Notes (Signed)
Occupational Therapy Treatment Patient Details Name: Hayden Mcbride MRN: [id number] DOB: 26-Feb-1961 Today's Date: 05/14/2021    History of present illness Pt is 60 yo male who presented on 04/29/21 with HA and ear pain became combative with slurred speech and intubated 5/18 > extubated 5/27. Found to have bacterial meningitis. PMH: HTN, GERD, OSA on CPAP, thyroid disease, colonic polyps.   OT comments  Pt continues to make excellent progress.  He was asleep upon OT entrance, and required increased time to rouse.  He demonstrates delayed response time and decreased initiation, but was able to stand x 1 with mod A, and x1 with min A.   He transferred to recliner with mod A.   Continue to recommend CIR.   Follow Up Recommendations  CIR    Equipment Recommendations  3 in 1 bedside commode    Recommendations for Other Services Rehab consult    Precautions / Restrictions Precautions Precautions: Fall Precaution Comments: generalized weakness, delayed processing       Mobility Bed Mobility Overal bed mobility: Needs Assistance Bed Mobility: Supine to Sit     Supine to sit: Min guard;HOB elevated     General bed mobility comments: min guard assist with HOB elevated and with use of grab bars    Transfers Overall transfer level: Needs assistance Equipment used: Rolling walker (2 wheeled);1 person hand held assist Transfers: Sit to/from [employer] Sit to Stand: Mod assist   Squat pivot transfers: Mod assist     General transfer comment: Pt stood x 2 for up to 2 mins.  First stand, he required mod A, on the second stand, he required min A.  Attempted a third attempt, but pt fatigued.  He was able to perform squat pivot transfer to reclincer with mod A with cues for hand placement and assist to guide hips    Balance Overall balance assessment: Needs assistance Sitting-balance support: No upper extremity supported;Feet unsupported Sitting balance-Leahy Scale:  Good     Standing balance support: Bilateral upper extremity supported Standing balance-Leahy Scale: Poor Standing balance comment: requires bil. UE supporty and min - mod A (variable)                           ADL either performed or assessed with clinical judgement   ADL                           Toilet Transfer: Moderate assistance;Squat-pivot;BSC           Functional mobility during ADLs: Moderate assistance       Vision       Perception     Praxis      Cognition Arousal/Alertness: Awake/alert Behavior During Therapy: Flat affect Overall Cognitive Status: Impaired/Different from baseline Area of Impairment: Attention;Following commands;Problem solving                   Current Attention Level: Sustained;Selective   Following Commands: Follows one step commands consistently;Follows one step commands inconsistently     Problem Solving: Slow processing;Decreased initiation;Difficulty sequencing;Requires verbal cues;Requires tactile cues General Comments: Pt demonstrates delayed processing and requires cues to initiate        Exercises     Shoulder Instructions       General Comments      Pertinent Vitals/ Pain       Pain Assessment: No/denies pain  Home Living  Prior Functioning/Environment              Frequency  Min 2X/week        Progress Toward Goals  OT Goals(current goals can now be found in the care plan section)  Progress towards OT goals: Progressing toward goals     Plan Discharge plan remains appropriate    Co-evaluation                 AM-PAC OT "6 Clicks" Daily Activity     Outcome Measure   Help from another person eating meals?: Total Help from another person taking care of personal grooming?: A Little Help from another person toileting, which includes using toliet, bedpan, or urinal?: A Lot Help from another person  bathing (including washing, rinsing, drying)?: A Lot Help from another person to put on and taking off regular upper body clothing?: A Lot Help from another person to put on and taking off regular lower body clothing?: A Lot 6 Click Score: 12    End of Session    OT Visit Diagnosis: Unsteadiness on feet (R26.81);Muscle weakness (generalized) (M62.81);Cognitive communication deficit (R41.841)   Activity Tolerance Patient tolerated treatment well   Patient Left in chair;with call bell/phone within reach;with chair alarm set   Nurse Communication Mobility status        Time: 3794-4461 OT Time Calculation (min): 40 min  Charges: OT General Charges $OT Visit: 1 Visit OT Treatments $Neuromuscular Re-education: 38-52 mins  Nilsa Nutting., OTR/L Acute Rehabilitation Services Pager [phone] Office [phone]    Lucille Passy M 05/14/2021, 12:13 PM

## 2021-05-14 NOTE — Plan of Care (Signed)

## 2021-05-14 NOTE — Progress Notes (Signed)
PROGRESS NOTE    Hayden Mcbride  UMP:[id number] DOB: 1960/12/22 DOA: 04/29/2021 PCP: Robyne Peers, MD    Brief Narrative:  60 year old male with hypertension, OSA, unspecified thyroid disease, comes into the hospital and is admitted on 5/17 due to sudden onset of headache, hearing loss and combativeness/confusion.  He recently returned from a 2-week trip to [place].  He was intubated in the ED and was admitted to the ICU.  Further work-up with an LP showed evidence of having pneumococcal meningitis.  ID consulted.  He was eventually extubated and transferred to the hospitalist service on 5/30.  Assessment & Plan:   Principal Problem:   Pneumococcal meningitis Active Problems:   Encounter for orogastric tube placement   AMS (altered mental status)   Meningitis   Acute respiratory failure with hypoxemia (HCC)   Status post peripherally inserted central catheter (PICC) central line placement   FUO (fever of unknown origin)   Staphylococcus aureus pneumonia (HCC)   Aspiration into respiratory tract   Atelectasis  Pneumococcal meningitis-he underwent an LP on 5/18 and cultures showed Streptococcus pneumoniae, pansensitive.  Completed course of  high-dose penicillin, appreciate ID follow-up  Active Problems MRSA pneumonia-he was having fevers, cultured and tracheal aspirate cultures on 5/25 showed MRSA.  He was initially on vancomycin but transition to Zyvox because of an elevation in creatinine.  Respiratory status is stable today -Would complete 7 days of tx per ID recs  Acute hypoxic respiratory failure, ventilator dependent respiratory failure -improving, weaned to room air  Paroxysmal A. fib-apparently had an episode of A. fib and he was placed on amiodarone. Currently he is in sinus rhythm.  He has not been anticoagulated due to bacterial meningitis and high risk of ICH.  His A. fib is likely in the setting of acute illness, if he remains in sinus and does not have recurrence  can probably avoid anticoagulation altogether.  His CHA2DS2-VASc score is 1 for hypertension.  2D echo done on 5/27 showed an EF of 65-70%, normal LVEF without WMA, normal RV.  Acute toxic encephalopathy -due to #1, supportive care.  Alert, somewhat conversant, following commands this AM  Thyroid disease-mentioned in the chart however TSH unremarkable.  Acute kidney injury-on 5/27 creatinine acutely increased to 1.6, possibly due to vancomycin.  Cr now stable and normal  Hypernatremia-resolved, DC IV fluids once cleared to eat by speech  Leukocytosis-reviewed, improving  LFT elevation-overall stable for the past week, mild. Cont to follow LFT trends. Will check RUQ [place]  Hypertension-Home medications include amlodipine 5 mg, losartan 100 mg.  Blood pressure is normal here, monitor  Hypokalemia-monitor and replete as necessary  Acute urinary retention-failed voiding trial on 5/27, he was placed on doxazosin, continue, would attempt voiding trial soon   DVT prophylaxis: Heparin subq Code Status: Full Family Communication: Pt in room, family not at bedside  Status is: Inpatient  Remains inpatient appropriate because:Inpatient level of care appropriate due to severity of illness   Dispo: The patient is from: Home              Anticipated d/c is to: CIR              Patient currently is not medically stable to d/c.   Difficult to place patient No       Consultants:   PCCM  ID  Procedures:   2d echo  LP  Antimicrobials: Anti-infectives (From admission, onward)   Start     Dose/Rate Route Frequency Ordered Stop  05/10/21 0500  linezolid (ZYVOX) IVPB 600 mg        600 mg 300 mL/hr over 60 Minutes Intravenous Every 12 hours 05/09/21 1050 05/17/21 0959   05/09/21 0500  vancomycin (VANCOREADY) IVPB 1500 mg/300 mL  Status:  Discontinued        1,500 mg 150 mL/hr over 120 Minutes Intravenous Every 12 hours 05/08/21 1353 05/09/21 1050   05/08/21 1500   vancomycin (VANCOREADY) IVPB 2000 mg/400 mL        2,000 mg 200 mL/hr over 120 Minutes Intravenous  Once 05/08/21 1353 05/08/21 1714   05/03/21 1215  penicillin G potassium 12 Million Units in dextrose 5 % 500 mL continuous infusion        12 Million Units 41.7 mL/hr over 12 Hours Intravenous Every 12 hours 05/03/21 1123 05/14/21 0800   05/01/21 2200  vancomycin (VANCOREADY) IVPB 1000 mg/200 mL  Status:  Discontinued        1,000 mg 200 mL/hr over 60 Minutes Intravenous Every 8 hours 05/01/21 1312 05/02/21 0902   05/01/21 1400  vancomycin (VANCOREADY) IVPB 1000 mg/200 mL        1,000 mg 200 mL/hr over 60 Minutes Intravenous NOW 05/01/21 1312 05/01/21 1523   04/30/21 1200  vancomycin (VANCOREADY) IVPB 1000 mg/200 mL  Status:  Discontinued        1,000 mg 200 mL/hr over 60 Minutes Intravenous Every 12 hours 04/29/21 2334 05/01/21 1312   04/30/21 0600  acyclovir (ZOVIRAX) 1,000 mg in dextrose 5 % 150 mL IVPB  Status:  Discontinued        1,000 mg 170 mL/hr over 60 Minutes Intravenous Every 8 hours 04/30/21 0243 04/30/21 1119   04/30/21 0000  ampicillin (OMNIPEN) 2 g in sodium chloride 0.9 % 100 mL IVPB  Status:  Discontinued        2 g 300 mL/hr over 20 Minutes Intravenous Every 4 hours 04/29/21 2330 05/01/21 0958   04/29/21 2345  cefTRIAXone (ROCEPHIN) 2 g in sodium chloride 0.9 % 100 mL IVPB  Status:  Discontinued        2 g 200 mL/hr over 30 Minutes Intravenous Every 12 hours 04/29/21 2330 05/03/21 1123   04/29/21 2345  vancomycin (VANCOREADY) IVPB 2000 mg/400 mL        2,000 mg 200 mL/hr over 120 Minutes Intravenous  Once 04/29/21 2334 04/30/21 0240       Subjective: Without complaints this AM  Objective: Vitals:   05/14/21 0500 05/14/21 0512 05/14/21 0736 05/14/21 1226  BP:  (!) 160/67 (!) 179/72 (!) 150/95  Pulse:  77 75 78  Resp:  20 18 18   Temp:  98.7 F (37.1 C) 98.8 F (37.1 C)   TempSrc:  Axillary Oral   SpO2:  95% 98% 98%  Weight: 110.4 kg     Height:         Intake/Output Summary (Last 24 hours) at 05/14/2021 1541 Last data filed at 05/14/2021 1500 Gross per 24 hour  Intake 1470 ml  Output 6100 ml  Net -4630 ml   Filed Weights   05/11/21 0500 05/13/21 0500 05/14/21 0500  Weight: 108.5 kg 112.6 kg 110.4 kg    Examination: General exam: Awake, laying in bed, in nad, NG is in place Respiratory system: Normal respiratory effort, no wheezing Cardiovascular system: regular rate, s1, s2 Gastrointestinal system: Soft, nondistended, positive BS Central nervous system: CN2-12 grossly intact, strength intact Extremities: Perfused, no clubbing Skin: Normal skin turgor, no notable skin lesions seen Psychiatry:  Mood normal // no visual hallucinations   Data Reviewed: I have personally reviewed following labs and imaging studies  CBC: Recent Labs  Lab 05/10/21 0500 05/11/21 0349 05/12/21 0059 05/13/21 0159 05/14/21 0356  WBC 31.6* 22.7* 16.8* 15.4* 13.2*  HGB 15.0 13.8 13.7 13.8 13.3  HCT 46.3 42.2 41.6 41.9 39.6  MCV 97.3 97.7 96.7 96.5 94.3  PLT 168 167 175 208 093   Basic Metabolic Panel: Recent Labs  Lab 05/09/21 0409 05/10/21 0500 05/11/21 0349 05/12/21 0059 05/13/21 0159 05/14/21 0356  NA 146* 148* 145 142 138 137  K 3.9 3.3* 3.2* 3.7 4.0 4.1  CL 113* 108 111 107 105 103  CO2 27 31 30 29 26 25   GLUCOSE 157* 203* 142* 111* 105* 123*  BUN 90* 56* 36* 27* 17 14  CREATININE 1.57* 0.96 0.70 0.76 0.84 0.69  CALCIUM 7.6* 7.6* 7.8* 8.0* 8.0* 8.2*  MG 2.8*  --   --  2.1 2.0  --   PHOS  --   --   --  3.1  --   --    GFR: Estimated Creatinine Clearance: 122.5 mL/min (by C-G formula based on SCr of 0.69 mg/dL). Liver Function Tests: Recent Labs  Lab 05/10/21 0500 05/13/21 0159 05/14/21 0356  AST 82* 85* 75*  ALT 86* 131* 148*  ALKPHOS 36* 45 48  BILITOT 0.5 1.1 0.7  PROT 4.7* 5.5* 5.5*  ALBUMIN 1.7* 1.8* 1.8*   No results for input(s): LIPASE, AMYLASE in the last 168 hours. No results for input(s): AMMONIA in the  last 168 hours. Coagulation Profile: No results for input(s): INR, PROTIME in the last 168 hours. Cardiac Enzymes: No results for input(s): CKTOTAL, CKMB, CKMBINDEX, TROPONINI in the last 168 hours. BNP (last 3 results) No results for input(s): PROBNP in the last 8760 hours. HbA1C: No results for input(s): HGBA1C in the last 72 hours. CBG: Recent Labs  Lab 05/13/21 2026 05/13/21 2301 05/14/21 0324 05/14/21 0739 05/14/21 1219  GLUCAP 109* 132* 119* 119* 125*   Lipid Profile: No results for input(s): CHOL, HDL, LDLCALC, TRIG, CHOLHDL, LDLDIRECT in the last 72 hours. Thyroid Function Tests: No results for input(s): TSH, T4TOTAL, FREET4, T3FREE, THYROIDAB in the last 72 hours. Anemia Panel: No results for input(s): VITAMINB12, FOLATE, FERRITIN, TIBC, IRON, RETICCTPCT in the last 72 hours. Sepsis Labs: No results for input(s): PROCALCITON, LATICACIDVEN in the last 168 hours.  Recent Results (from the past 240 hour(s))  Culture, Respiratory w Gram Stain     Status: None   Collection Time: 05/07/21 11:58 AM   Specimen: Tracheal Aspirate; Respiratory  Result Value Ref Range Status   Specimen Description TRACHEAL ASPIRATE  Final   Special Requests Immunocompromised  Final   Gram Stain   Final    ABUNDANT WBC PRESENT, PREDOMINANTLY PMN ABUNDANT GRAM POSITIVE COCCI RARE YEAST Performed at [facility], [address], [place], [place] [address]    Culture   Final    MODERATE METHICILLIN RESISTANT STAPHYLOCOCCUS AUREUS CORRECTED ON 05/27 AT 4580: PREVIOUSLY REPORTED AS MODERATE STAPHYLOCOCCUS AUREUS   Report Status 05/09/2021 FINAL  Final   Organism ID, Bacteria METHICILLIN RESISTANT STAPHYLOCOCCUS AUREUS  Final      Susceptibility   Methicillin resistant staphylococcus aureus - MIC*    CIPROFLOXACIN >=8 RESISTANT Resistant     ERYTHROMYCIN >=8 RESISTANT Resistant     GENTAMICIN <=0.5 SENSITIVE Sensitive     OXACILLIN >=4 RESISTANT Resistant     TETRACYCLINE <=1 SENSITIVE  Sensitive  VANCOMYCIN 1 SENSITIVE Sensitive     TRIMETH/SULFA >=320 RESISTANT Resistant     CLINDAMYCIN <=0.25 SENSITIVE Sensitive     RIFAMPIN <=0.5 SENSITIVE Sensitive     Inducible Clindamycin NEGATIVE Sensitive     * MODERATE METHICILLIN RESISTANT STAPHYLOCOCCUS AUREUS CORRECTED ON 05/27 AT 3559: PREVIOUSLY REPORTED AS MODERATE STAPHYLOCOCCUS AUREUS  Culture, blood (Routine X 2) w Reflex to ID Panel     Status: None   Collection Time: 05/07/21  3:50 PM   Specimen: BLOOD  Result Value Ref Range Status   Specimen Description BLOOD LEFT ANTECUBITAL  Final   Special Requests   Final    BOTTLES DRAWN AEROBIC AND ANAEROBIC Blood Culture results may not be optimal due to an inadequate volume of blood received in culture bottles   Culture   Final    NO GROWTH 5 DAYS Performed at [facility], [place] [address] [place], [place] [address]    Report Status 05/12/2021 FINAL  Final  Culture, blood (Routine X 2) w Reflex to ID Panel     Status: None   Collection Time: 05/07/21  3:57 PM   Specimen: BLOOD  Result Value Ref Range Status   Specimen Description BLOOD LEFT ANTECUBITAL  Final   Special Requests   Final    BOTTLES DRAWN AEROBIC AND ANAEROBIC Blood Culture adequate volume   Culture   Final    NO GROWTH 5 DAYS Performed at [facility], [place] [address] [place], [place] [address]    Report Status 05/12/2021 FINAL  Final     Radiology Studies: DG Abd Portable 1V  Result Date: 05/13/2021 CLINICAL DATA:  Feeding tube placement. EXAM: PORTABLE ABDOMEN - 1 VIEW COMPARISON:  Chest radiograph 05/12/2021 FINDINGS: A feeding tube has been placed and terminates in the expected region of the mid gastric body. A right PICC remains in place. Lung volumes are mildly low without evidence of airspace consolidation, edema, a sizable pleural effusion, or pneumothorax. Only the uppermost portion of the abdomen was included on this study. IMPRESSION: Feeding tube placement as above.  Electronically Signed   By: Logan Bores M.D.   On: 05/13/2021 11:15    Scheduled Meds: . amiodarone  200 mg Per Tube BID  . Chlorhexidine Gluconate Cloth  6 each Topical Q0600  . doxazosin  2 mg Per Tube Daily  . feeding supplement (PROSource TF)  45 mL Per Tube QID  . free water  250 mL Per Tube Q6H  . heparin injection (subcutaneous)  5,000 Units Subcutaneous Q8H  . insulin aspart  0-20 Units Subcutaneous TID WC  . mouth rinse  15 mL Mouth Rinse BID  . sodium chloride flush  10-40 mL Intracatheter Q12H   Continuous Infusions: . dextrose 50 mL/hr at 05/14/21 0904  . feeding supplement (VITAL 1.5 CAL) 1,000 mL (05/13/21 1230)  . linezolid (ZYVOX) IV 600 mg (05/14/21 0854)     LOS: 14 days   Marylu Lund, MD [facility] Pager On Amion  If 7PM-7AM, please contact night-coverage 05/14/2021, 3:41 PM

## 2021-05-15 LAB — COMPREHENSIVE METABOLIC PANEL
ALT: 134 U/L — ABNORMAL HIGH (ref 0–44)
AST: 55 U/L — ABNORMAL HIGH (ref 15–41)
Albumin: 1.9 g/dL — ABNORMAL LOW (ref 3.5–5.0)
Alkaline Phosphatase: 46 U/L (ref 38–126)
Anion gap: 6 (ref 5–15)
BUN: 12 mg/dL (ref 8–23)
CO2: 25 mmol/L (ref 22–32)
Calcium: 8.1 mg/dL — ABNORMAL LOW (ref 8.9–10.3)
Chloride: 102 mmol/L (ref 98–111)
Creatinine, Ser: 0.65 mg/dL (ref 0.61–1.24)
GFR, Estimated: >60 mL/min (ref >60)
Glucose, Bld: 115 mg/dL — ABNORMAL HIGH (ref 70–99)
Potassium: 3.8 mmol/L (ref 3.5–5.1)
Sodium: 133 mmol/L — ABNORMAL LOW (ref 135–145)
Total Bilirubin: 0.8 mg/dL (ref 0.3–1.2)
Total Protein: 5.9 g/dL — ABNORMAL LOW (ref 6.5–8.1)

## 2021-05-15 LAB — CBC
HCT: 39.5 % (ref 39.0–52.0)
Hemoglobin: 13.2 g/dL (ref 13.0–17.0)
MCH: 31.4 pg (ref 26.0–34.0)
MCHC: 33.4 g/dL (ref 30.0–36.0)
MCV: 94 fL (ref 80.0–100.0)
Platelets: 240 10*3/uL (ref 150–400)
RBC: 4.2 MIL/uL — ABNORMAL LOW (ref 4.22–5.81)
RDW: 12.9 % (ref 11.5–15.5)
WBC: 12.6 10*3/uL — ABNORMAL HIGH (ref 4.0–10.5)
nRBC: 0 % (ref 0.0–0.2)

## 2021-05-15 LAB — GLUCOSE, CAPILLARY
Glucose-Capillary: 133 mg/dL — ABNORMAL HIGH (ref 70–99)
Glucose-Capillary: 138 mg/dL — ABNORMAL HIGH (ref 70–99)
Glucose-Capillary: 133 mg/dL — ABNORMAL HIGH (ref 70–99)
Glucose-Capillary: 119 mg/dL — ABNORMAL HIGH (ref 70–99)
Glucose-Capillary: 115 mg/dL — ABNORMAL HIGH (ref 70–99)

## 2021-05-15 LAB — ARBOVIRUS PANEL, ~~LOC~~ LAB

## 2021-05-15 NOTE — Progress Notes (Signed)
Per shift report at 7am patient's tube feeding was stooped 5 mins ago however was running throughout the night after US abdomen was done with concerns of aspiration because patient was coughing vigorously and per nurse sounded wet. Upon assessment patient's lung sounds wet and fine crackles herd. Patient is not having vigorous cough at this time.Patient's VS checked and within normal limit. Patient resting and no signs of acute distress noted. MD made aware of the situation and assessment. No new order or further instruction received at this time.

## 2021-05-15 NOTE — Progress Notes (Signed)
PROGRESS NOTE    Hayden Mcbride  TGY:[id number] DOB: 05/05/1961 DOA: 04/29/2021 PCP: Robyne Peers, MD    Brief Narrative:  60 year old male with hypertension, OSA, unspecified thyroid disease, comes into the hospital and is admitted on 5/17 due to sudden onset of headache, hearing loss and combativeness/confusion.  He recently returned from a 2-week trip to [place].  He was intubated in the ED and was admitted to the ICU.  Further work-up with an LP showed evidence of having pneumococcal meningitis.  ID consulted.  He was eventually extubated and transferred to the hospitalist service on 5/30.  Assessment & Plan:   Principal Problem:   Pneumococcal meningitis Active Problems:   Encounter for orogastric tube placement   AMS (altered mental status)   Meningitis   Acute respiratory failure with hypoxemia (HCC)   Status post peripherally inserted central catheter (PICC) central line placement   FUO (fever of unknown origin)   Staphylococcus aureus pneumonia (HCC)   Aspiration into respiratory tract   Atelectasis  Pneumococcal meningitis-he underwent an LP on 5/18 and cultures showed Streptococcus pneumoniae, pansensitive.  Completed course of  high-dose penicillin, ID had been following  Active Problems MRSA pneumonia-he was having fevers, cultured and tracheal aspirate cultures on 5/25 showed MRSA.  He was initially on vancomycin but transition to Zyvox because of an elevation in creatinine.  Respiratory status is stable today -Plan to complete 7 days of tx per ID recs  Acute hypoxic respiratory failure, ventilator dependent respiratory failure -improving, weaned to room air -Coarse breath sounds, however pt noted to have mucus in posterior pharynx, lung sounds clear after pt cleared throat  Paroxysmal A. fib-apparently had an episode of A. fib and he was placed on amiodarone. Currently he is in sinus rhythm.  He has not been anticoagulated due to bacterial meningitis and high  risk of ICH.  His A. fib is likely in the setting of acute illness, if he remains in sinus and does not have recurrence can probably avoid anticoagulation altogether.  His CHA2DS2-VASc score is 1 for hypertension.  2D echo done on 5/27 showed an EF of 65-70%, normal LVEF without WMA, normal RV.  Acute toxic encephalopathy -due to #1, supportive care.  Remains alert, conversant, follows commands  Thyroid disease-mentioned in the chart however TSH unremarkable.  Acute kidney injury-on 5/27 creatinine acutely increased to 1.6, possibly due to vancomycin.  Cr currently stable  Hypernatremia-resolved, DC IV fluids once cleared to eat by speech  Leukocytosis-reviewed, improving  LFT elevation-overall stable for the past week, mild. Cont to follow LFT trends. Will check RUQ [place]  Hypertension-Home medications include amlodipine 5 mg, losartan 100 mg.  Blood pressure is currently stable  Hypokalemia-monitor and replete as necessary  Acute urinary retention-failed voiding trial on 5/27, he was placed on doxazosin, continue, consider voiding trial down the road   DVT prophylaxis: Heparin subq Code Status: Full Family Communication: Pt in room, family not at bedside  Status is: Inpatient  Remains inpatient appropriate because:Inpatient level of care appropriate due to severity of illness   Dispo: The patient is from: Home              Anticipated d/c is to: CIR              Patient currently is not medically stable to d/c.   Difficult to place patient No   Consultants:   PCCM  ID  Procedures:   2d echo  LP  Antimicrobials: Anti-infectives (From admission, onward)  Start     Dose/Rate Route Frequency Ordered Stop   05/10/21 0500  linezolid (ZYVOX) IVPB 600 mg        600 mg 300 mL/hr over 60 Minutes Intravenous Every 12 hours 05/09/21 1050 05/17/21 0959   05/09/21 0500  vancomycin (VANCOREADY) IVPB 1500 mg/300 mL  Status:  Discontinued        1,500 mg 150 mL/hr  over 120 Minutes Intravenous Every 12 hours 05/08/21 1353 05/09/21 1050   05/08/21 1500  vancomycin (VANCOREADY) IVPB 2000 mg/400 mL        2,000 mg 200 mL/hr over 120 Minutes Intravenous  Once 05/08/21 1353 05/08/21 1714   05/03/21 1215  penicillin G potassium 12 Million Units in dextrose 5 % 500 mL continuous infusion        12 Million Units 41.7 mL/hr over 12 Hours Intravenous Every 12 hours 05/03/21 1123 05/14/21 0800   05/01/21 2200  vancomycin (VANCOREADY) IVPB 1000 mg/200 mL  Status:  Discontinued        1,000 mg 200 mL/hr over 60 Minutes Intravenous Every 8 hours 05/01/21 1312 05/02/21 0902   05/01/21 1400  vancomycin (VANCOREADY) IVPB 1000 mg/200 mL        1,000 mg 200 mL/hr over 60 Minutes Intravenous NOW 05/01/21 1312 05/01/21 1523   04/30/21 1200  vancomycin (VANCOREADY) IVPB 1000 mg/200 mL  Status:  Discontinued        1,000 mg 200 mL/hr over 60 Minutes Intravenous Every 12 hours 04/29/21 2334 05/01/21 1312   04/30/21 0600  acyclovir (ZOVIRAX) 1,000 mg in dextrose 5 % 150 mL IVPB  Status:  Discontinued        1,000 mg 170 mL/hr over 60 Minutes Intravenous Every 8 hours 04/30/21 0243 04/30/21 1119   04/30/21 0000  ampicillin (OMNIPEN) 2 g in sodium chloride 0.9 % 100 mL IVPB  Status:  Discontinued        2 g 300 mL/hr over 20 Minutes Intravenous Every 4 hours 04/29/21 2330 05/01/21 0958   04/29/21 2345  cefTRIAXone (ROCEPHIN) 2 g in sodium chloride 0.9 % 100 mL IVPB  Status:  Discontinued        2 g 200 mL/hr over 30 Minutes Intravenous Every 12 hours 04/29/21 2330 05/03/21 1123   04/29/21 2345  vancomycin (VANCOREADY) IVPB 2000 mg/400 mL        2,000 mg 200 mL/hr over 120 Minutes Intravenous  Once 04/29/21 2334 04/30/21 0240      Subjective: Complains of "slimy" mucus in "the back of my throat"  Objective: Vitals:   05/14/21 2007 05/15/21 0500 05/15/21 0538 05/15/21 0749  BP: (!) 146/78  (!) 151/74 (!) 164/79  Pulse: 79  77 75  Resp: 19  (!) 22 20  Temp: 99.3 F  (37.4 C)  97.8 F (36.6 C) 98.1 F (36.7 C)  TempSrc: Oral  Oral Oral  SpO2: 96%  96% 97%  Weight:  108.5 kg    Height:        Intake/Output Summary (Last 24 hours) at 05/15/2021 1504 Last data filed at 05/15/2021 1338 Gross per 24 hour  Intake 1093.3 ml  Output 4150 ml  Net -3056.7 ml   Filed Weights   05/13/21 0500 05/14/21 0500 05/15/21 0500  Weight: 112.6 kg 110.4 kg 108.5 kg    Examination: General exam: Conversant, in no acute distress Respiratory system: normal chest rise, clear, no audible wheezing Cardiovascular system: regular rhythm, s1-s2 Gastrointestinal system: Nondistended, nontender, pos BS Central nervous system: No seizures,  no tremors Extremities: No cyanosis, no joint deformities Skin: No rashes, no pallor Psychiatry: Affect normal // no auditory hallucinations   Data Reviewed: I have personally reviewed following labs and imaging studies  CBC: Recent Labs  Lab 05/11/21 0349 05/12/21 0059 05/13/21 0159 05/14/21 0356 05/15/21 0235  WBC 22.7* 16.8* 15.4* 13.2* 12.6*  HGB 13.8 13.7 13.8 13.3 13.2  HCT 42.2 41.6 41.9 39.6 39.5  MCV 97.7 96.7 96.5 94.3 94.0  PLT 167 175 208 205 622   Basic Metabolic Panel: Recent Labs  Lab 05/09/21 0409 05/10/21 0500 05/11/21 0349 05/12/21 0059 05/13/21 0159 05/14/21 0356 05/15/21 0235  NA 146*   < > 145 142 138 137 133*  K 3.9   < > 3.2* 3.7 4.0 4.1 3.8  CL 113*   < > 111 107 105 103 102  CO2 27   < > 30 29 26 25 25   GLUCOSE 157*   < > 142* 111* 105* 123* 115*  BUN 90*   < > 36* 27* 17 14 12   CREATININE 1.57*   < > 0.70 0.76 0.84 0.69 0.65  CALCIUM 7.6*   < > 7.8* 8.0* 8.0* 8.2* 8.1*  MG 2.8*  --   --  2.1 2.0  --   --   PHOS  --   --   --  3.1  --   --   --    < > = values in this interval not displayed.   GFR: Estimated Creatinine Clearance: 121.5 mL/min (by C-G formula based on SCr of 0.65 mg/dL). Liver Function Tests: Recent Labs  Lab 05/10/21 0500 05/13/21 0159 05/14/21 0356 05/15/21 0235   AST 82* 85* 75* 55*  ALT 86* 131* 148* 134*  ALKPHOS 36* 45 48 46  BILITOT 0.5 1.1 0.7 0.8  PROT 4.7* 5.5* 5.5* 5.9*  ALBUMIN 1.7* 1.8* 1.8* 1.9*   No results for input(s): LIPASE, AMYLASE in the last 168 hours. No results for input(s): AMMONIA in the last 168 hours. Coagulation Profile: No results for input(s): INR, PROTIME in the last 168 hours. Cardiac Enzymes: No results for input(s): CKTOTAL, CKMB, CKMBINDEX, TROPONINI in the last 168 hours. BNP (last 3 results) No results for input(s): PROBNP in the last 8760 hours. HbA1C: No results for input(s): HGBA1C in the last 72 hours. CBG: Recent Labs  Lab 05/14/21 1923 05/14/21 2304 05/15/21 0311 05/15/21 0719 05/15/21 1111  GLUCAP 133* 114* 133* 138* 133*   Lipid Profile: No results for input(s): CHOL, HDL, LDLCALC, TRIG, CHOLHDL, LDLDIRECT in the last 72 hours. Thyroid Function Tests: No results for input(s): TSH, T4TOTAL, FREET4, T3FREE, THYROIDAB in the last 72 hours. Anemia Panel: No results for input(s): VITAMINB12, FOLATE, FERRITIN, TIBC, IRON, RETICCTPCT in the last 72 hours. Sepsis Labs: No results for input(s): PROCALCITON, LATICACIDVEN in the last 168 hours.  Recent Results (from the past 240 hour(s))  Culture, Respiratory w Gram Stain     Status: None   Collection Time: 05/07/21 11:58 AM   Specimen: Tracheal Aspirate; Respiratory  Result Value Ref Range Status   Specimen Description TRACHEAL ASPIRATE  Final   Special Requests Immunocompromised  Final   Gram Stain   Final    ABUNDANT WBC PRESENT, PREDOMINANTLY PMN ABUNDANT GRAM POSITIVE COCCI RARE YEAST Performed at [facility], [address] [place], [place] [address]    Culture   Final    MODERATE METHICILLIN RESISTANT STAPHYLOCOCCUS AUREUS CORRECTED ON 05/27 AT 0824: PREVIOUSLY REPORTED AS MODERATE STAPHYLOCOCCUS AUREUS  Report Status 05/09/2021 FINAL  Final   Organism ID, Bacteria METHICILLIN RESISTANT STAPHYLOCOCCUS AUREUS  Final       Susceptibility   Methicillin resistant staphylococcus aureus - MIC*    CIPROFLOXACIN >=8 RESISTANT Resistant     ERYTHROMYCIN >=8 RESISTANT Resistant     GENTAMICIN <=0.5 SENSITIVE Sensitive     OXACILLIN >=4 RESISTANT Resistant     TETRACYCLINE <=1 SENSITIVE Sensitive     VANCOMYCIN 1 SENSITIVE Sensitive     TRIMETH/SULFA >=320 RESISTANT Resistant     CLINDAMYCIN <=0.25 SENSITIVE Sensitive     RIFAMPIN <=0.5 SENSITIVE Sensitive     Inducible Clindamycin NEGATIVE Sensitive     * MODERATE METHICILLIN RESISTANT STAPHYLOCOCCUS AUREUS CORRECTED ON 05/27 AT 6270: PREVIOUSLY REPORTED AS MODERATE STAPHYLOCOCCUS AUREUS  Culture, blood (Routine X 2) w Reflex to ID Panel     Status: None   Collection Time: 05/07/21  3:50 PM   Specimen: BLOOD  Result Value Ref Range Status   Specimen Description BLOOD LEFT ANTECUBITAL  Final   Special Requests   Final    BOTTLES DRAWN AEROBIC AND ANAEROBIC Blood Culture results may not be optimal due to an inadequate volume of blood received in culture bottles   Culture   Final    NO GROWTH 5 DAYS Performed at [facility], [address] [place], [place] [address]    Report Status 05/12/2021 FINAL  Final  Culture, blood (Routine X 2) w Reflex to ID Panel     Status: None   Collection Time: 05/07/21  3:57 PM   Specimen: BLOOD  Result Value Ref Range Status   Specimen Description BLOOD LEFT ANTECUBITAL  Final   Special Requests   Final    BOTTLES DRAWN AEROBIC AND ANAEROBIC Blood Culture adequate volume   Culture   Final    NO GROWTH 5 DAYS Performed at [facility], [place] [address], [place], [place] [address]    Report Status 05/12/2021 FINAL  Final     Radiology Studies: US Abdomen Limited RUQ (LIVER/GB)  Result Date: 05/14/2021 CLINICAL DATA:  60 year old male with elevated LFTs. EXAM: ULTRASOUND ABDOMEN LIMITED RIGHT UPPER QUADRANT COMPARISON:  None. FINDINGS: Gallbladder: Cholecystectomy. Common bile duct: Diameter: 5 mm Liver: No focal  lesion identified. Within normal limits in parenchymal echogenicity. Portal vein is patent on color Doppler imaging with normal direction of blood flow towards the liver. Other: None. IMPRESSION: Cholecystectomy, otherwise unremarkable right upper quadrant ultrasound. Electronically Signed   By: Anner Crete M.D.   On: 05/14/2021 23:49    Scheduled Meds: . amiodarone  200 mg Per Tube BID  . Chlorhexidine Gluconate Cloth  6 each Topical Q0600  . doxazosin  2 mg Per Tube Daily  . feeding supplement (PROSource TF)  45 mL Per Tube QID  . free water  250 mL Per Tube Q6H  . heparin injection (subcutaneous)  5,000 Units Subcutaneous Q8H  . insulin aspart  0-20 Units Subcutaneous TID WC  . mouth rinse  15 mL Mouth Rinse BID  . sodium chloride flush  10-40 mL Intracatheter Q12H   Continuous Infusions: . dextrose 50 mL/hr at 05/14/21 0904  . feeding supplement (VITAL 1.5 CAL) 55 mL/hr at 05/15/21 1132  . linezolid (ZYVOX) IV 600 mg (05/15/21 0906)     LOS: 15 days   Marylu Lund, MD [facility] Pager On Amion  If 7PM-7AM, please contact night-coverage 05/15/2021, 3:04 PM

## 2021-05-15 NOTE — Plan of Care (Signed)

## 2021-05-15 NOTE — Progress Notes (Signed)
IP rehab admissions - following for potential acute inpatient rehab admission.  Awaiting insurance authorization.  Patient up in chair today and currently is asleep.  Will update all once I hear back from insurance case manager.   Call for questions.  [phone]

## 2021-05-15 NOTE — Progress Notes (Signed)
Tube feeding resumed per order at this time.

## 2021-05-15 NOTE — Progress Notes (Signed)
  Speech Language Pathology Treatment: Dysphagia  Patient Details Name: Hayden Mcbride MRN: [id number] DOB: 12-06-1961 Today's Date: 05/15/2021 Time: 6144-3154 SLP Time Calculation (min) (ACUTE ONLY): 29 min  Assessment / Plan / Recommendation Clinical Impression  Pt seen upright in bed after PT session. More alert and aware than in prior sessions. Pt complains of a slimy mouth so provided set up assist and verbal cues for pt to brush his teeth and clear coated lingual and palatal secretions. Pt able to swish and suction with instruction. Pt self fed bites of ice with multiple swallows and coughing. Spoonfuls of honey thick water better tolerated than in prior sessions. Will proceed with MBS tomorrow to reassess swallowing.   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 Recent return from 2 week trip to [place] early May.  Pt has persistent encephalopathy, slowly improving. ETT 517-5/27.  CXR 5/27 concerning for possible RLL pna.  MRI 5/24 c/w bacterial meningitis.  Prior hx: GERD, Glaucoma, Colon Polyps, HTN, OSA, Thyroid Disease      SLP Plan  MBS       Recommendations  Diet recommendations: NPO                Oral Care Recommendations: Oral care BID (brush teeth with a toothbrush) Follow up Recommendations: Inpatient Rehab SLP Visit Diagnosis: Dysphagia, oropharyngeal phase (R13.12) Plan: MBS       GO                Seung Nidiffer, Katherene Ponto 05/15/2021, 2:13 PM

## 2021-05-15 NOTE — Progress Notes (Signed)
Physical Therapy Treatment Patient Details Name: Hayden Mcbride MRN: [id number] DOB: 1961/06/30 Today's Date: 05/15/2021    History of Present Illness Pt is 60 yo male who presented on 04/29/21 with HA and ear pain became combative with slurred speech and intubated 5/18 > extubated 5/27. Found to have bacterial meningitis. PMH: HTN, GERD, OSA on CPAP, thyroid disease, colonic polyps.    PT Comments    Pt making good progress with all aspects of mobility. Continue to feel pt is excellent candidate for CIR. Will continue to work toward progressing to hallway ambulation.    Follow Up Recommendations  CIR;Supervision/Assistance - 24 hour     Equipment Recommendations  Rolling walker with 5" wheels;Wheelchair cushion (measurements PT);Wheelchair (measurements PT);3in1 (PT)    Recommendations for Other Services       Precautions / Restrictions Precautions Precautions: Fall    Mobility  Bed Mobility Overal bed mobility: Needs Assistance Bed Mobility: Sit to Sidelying         Sit to sidelying: Min assist General bed mobility comments: Assist to bring legs back up into the bed    Transfers Overall transfer level: Needs assistance Equipment used: Rolling walker (2 wheeled) Transfers: Sit to/from [facility] Sit to Stand: Min assist Stand pivot transfers: Min assist       General transfer comment: Assist to bring hips up and for balance. Verbal cues for hand placement. Chair to bed with stand pivot using walker  Ambulation/Gait Ambulation/Gait assistance: +2 safety/equipment;Min assist Gait Distance (Feet): 6 Feet Assistive device: Rolling walker (2 wheeled) Gait Pattern/deviations: Shuffle;Step-through pattern;Decreased step length - right;Decreased step length - left Gait velocity: decr Gait velocity interpretation: <1.31 ft/sec, indicative of household ambulator General Gait Details: Assist for balance and support. Slow initiation of steps. Followed with  chair for safety.   Stairs             Wheelchair Mobility    Modified Rankin (Stroke Patients Only)       Balance Overall balance assessment: Needs assistance Sitting-balance support: No upper extremity supported;Feet supported Sitting balance-Leahy Scale: Good     Standing balance support: Bilateral upper extremity supported Standing balance-Leahy Scale: Poor Standing balance comment: walker and min assist for static standing                            Cognition Arousal/Alertness: Awake/alert Behavior During Therapy: Flat affect Overall Cognitive Status: Impaired/Different from baseline Area of Impairment: Attention;Following commands;Problem solving                   Current Attention Level: Selective   Following Commands: Follows one step commands consistently;Follows multi-step commands inconsistently     Problem Solving: Slow processing;Requires verbal cues;Decreased initiation;Requires tactile cues        Exercises      General Comments        Pertinent Vitals/Pain Pain Assessment: No/denies pain    Home Living                      Prior Function            PT Goals (current goals can now be found in the care plan section) Acute Rehab PT Goals Patient Stated Goal: go home Progress towards PT goals: Progressing toward goals    Frequency    Min 3X/week      PT Plan Current plan remains appropriate    Co-evaluation  AM-PAC PT "6 Clicks" Mobility   Outcome Measure  Help needed turning from your back to your side while in a flat bed without using bedrails?: A Little Help needed moving from lying on your back to sitting on the side of a flat bed without using bedrails?: A Little Help needed moving to and from a bed to a chair (including a wheelchair)?: A Little Help needed standing up from a chair using your arms (e.g., wheelchair or bedside chair)?: A Little Help needed to walk in  hospital room?: A Little Help needed climbing 3-5 steps with a railing? : Total 6 Click Score: 16    End of Session Equipment Utilized During Treatment: Gait belt Activity Tolerance: Patient tolerated treatment well Patient left: with call bell/phone within reach;in bed;with bed alarm set;Other (comment) (SLP present) Nurse Communication: Mobility status PT Visit Diagnosis: Muscle weakness (generalized) (M62.81);Difficulty in walking, not elsewhere classified (R26.2);Unsteadiness on feet (R26.81)     Time: 1572-6203 PT Time Calculation (min) (ACUTE ONLY): 24 min  Charges:  $Gait Training: 8-22 mins $Therapeutic Activity: 8-22 mins                     [place] Pager [phone] Office [place] 05/15/2021, 12:39 PM

## 2021-05-16 ENCOUNTER — Inpatient Hospital Stay (HOSPITAL_COMMUNITY): Payer: Commercial Managed Care - PPO

## 2021-05-16 LAB — COMPREHENSIVE METABOLIC PANEL
ALT: 117 U/L — ABNORMAL HIGH (ref 0–44)
AST: 43 U/L — ABNORMAL HIGH (ref 15–41)
Albumin: 1.8 g/dL — ABNORMAL LOW (ref 3.5–5.0)
Alkaline Phosphatase: 49 U/L (ref 38–126)
Anion gap: 4 — ABNORMAL LOW (ref 5–15)
BUN: 13 mg/dL (ref 8–23)
CO2: 25 mmol/L (ref 22–32)
Calcium: 8.1 mg/dL — ABNORMAL LOW (ref 8.9–10.3)
Chloride: 108 mmol/L (ref 98–111)
Creatinine, Ser: 0.67 mg/dL (ref 0.61–1.24)
GFR, Estimated: >60 mL/min (ref >60)
Glucose, Bld: 124 mg/dL — ABNORMAL HIGH (ref 70–99)
Potassium: 3.8 mmol/L (ref 3.5–5.1)
Sodium: 137 mmol/L (ref 135–145)
Total Bilirubin: 0.5 mg/dL (ref 0.3–1.2)
Total Protein: 5.6 g/dL — ABNORMAL LOW (ref 6.5–8.1)

## 2021-05-16 LAB — CBC
HCT: 38.1 % — ABNORMAL LOW (ref 39.0–52.0)
Hemoglobin: 12.8 g/dL — ABNORMAL LOW (ref 13.0–17.0)
MCH: 32 pg (ref 26.0–34.0)
MCHC: 33.6 g/dL (ref 30.0–36.0)
MCV: 95.3 fL (ref 80.0–100.0)
Platelets: 260 10*3/uL (ref 150–400)
RBC: 4 MIL/uL — ABNORMAL LOW (ref 4.22–5.81)
RDW: 13 % (ref 11.5–15.5)
WBC: 12.6 10*3/uL — ABNORMAL HIGH (ref 4.0–10.5)
nRBC: 0 % (ref 0.0–0.2)

## 2021-05-16 LAB — GLUCOSE, CAPILLARY
Glucose-Capillary: 134 mg/dL — ABNORMAL HIGH (ref 70–99)
Glucose-Capillary: 132 mg/dL — ABNORMAL HIGH (ref 70–99)
Glucose-Capillary: 136 mg/dL — ABNORMAL HIGH (ref 70–99)
Glucose-Capillary: 133 mg/dL — ABNORMAL HIGH (ref 70–99)
Glucose-Capillary: 101 mg/dL — ABNORMAL HIGH (ref 70–99)
Glucose-Capillary: 124 mg/dL — ABNORMAL HIGH (ref 70–99)

## 2021-05-16 MED ORDER — DICLOFENAC SODIUM 1 % EX GEL
2.0000 g | Freq: Three times a day (TID) | CUTANEOUS | Status: DC | PRN
  Administered 2021-05-16 – 2021-05-18 (×3): 2 g via TOPICAL
  Filled 2021-05-16: qty 100

## 2021-05-16 NOTE — Progress Notes (Addendum)
Assisted with patient ambulation in the room with assistance of walker. No distress noted. Patient up and in the chair resting comfortably.

## 2021-05-16 NOTE — Progress Notes (Signed)
Assisted with patient ambulation in the room with assistance of walker. No distress noted. Patient up and in the chair resting comfortably.

## 2021-05-16 NOTE — Plan of Care (Signed)

## 2021-05-16 NOTE — Progress Notes (Signed)
PROGRESS NOTE    Hayden Mcbride  TSV:[id number] DOB: 01/12/1961 DOA: 04/29/2021 PCP: Robyne Peers, MD    Brief Narrative:  60 year old male with hypertension, OSA, unspecified thyroid disease, comes into the hospital and is admitted on 5/17 due to sudden onset of headache, hearing loss and combativeness/confusion.  He recently returned from a 2-week trip to [place].  He was intubated in the ED and was admitted to the ICU.  Further work-up with an LP showed evidence of having pneumococcal meningitis.  ID consulted.  He was eventually extubated and transferred to the hospitalist service on 5/30.  Assessment & Plan:   Principal Problem:   Pneumococcal meningitis Active Problems:   Encounter for orogastric tube placement   AMS (altered mental status)   Meningitis   Acute respiratory failure with hypoxemia (HCC)   Status post peripherally inserted central catheter (PICC) central line placement   FUO (fever of unknown origin)   Staphylococcus aureus pneumonia (HCC)   Aspiration into respiratory tract   Atelectasis  Pneumococcal meningitis-he underwent an LP on 5/18 and cultures showed Streptococcus pneumoniae, pansensitive.  Completed course of  high-dose penicillin, ID had been following  Active Problems MRSA pneumonia-he was having fevers, cultured and tracheal aspirate cultures on 5/25 showed MRSA.  He was initially on vancomycin but transition to Zyvox because of an elevation in creatinine.  Respiratory status is stable today -Plan to complete 7 days of tx per ID recs, ending 6/4  Acute hypoxic respiratory failure, ventilator dependent respiratory failure -improving, weaned to room air -Coarse breath sounds, however pt noted to have mucus in posterior pharynx, lung sounds remain clear, improved after pt cleared throat  Paroxysmal A. fib-apparently had an episode of A. fib and he was placed on amiodarone. Currently he is in sinus rhythm.  He has not been anticoagulated due to  bacterial meningitis and high risk of ICH.  His A. fib is likely in the setting of acute illness, if he remains in sinus and does not have recurrence can probably avoid anticoagulation altogether.  His CHA2DS2-VASc score is 1 for hypertension.  2D echo done on 5/27 showed an EF of 65-70%, normal LVEF without WMA, normal RV.  Acute toxic encephalopathy -due to #1, supportive care.  Remains alert, conversant, follows commands  Thyroid disease-mentioned in the chart however TSH unremarkable.  Acute kidney injury-on 5/27 creatinine acutely increased to 1.6, possibly due to vancomycin.  Cr currently stable  Hypernatremia-resolved, DC IV fluids once cleared to eat by speech  Leukocytosis-reviewed, improving  LFT elevation-overall stable for the past week, mild. Cont to follow LFT trends. RUQ [place] reviewed, s/p cholecystectomy, unremarkable  Hypertension-Home medications include amlodipine 5 mg, losartan 100 mg.  Blood pressure is currently stable  Hypokalemia-monitor and replete as necessary  Acute urinary retention-failed voiding trial on 5/27, he was placed on doxazosin, continue, consider voiding trial down the road  DVT prophylaxis: Heparin subq Code Status: Full Family Communication: Pt in room, family not at bedside  Status is: Inpatient  Remains inpatient appropriate because:Inpatient level of care appropriate due to severity of illness   Dispo: The patient is from: Home              Anticipated d/c is to: CIR              Patient currently is not medically stable to d/c.   Difficult to place patient No   Consultants:   PCCM  ID  Procedures:   2d echo  LP  Antimicrobials: Anti-infectives (From admission, onward)   Start     Dose/Rate Route Frequency Ordered Stop   05/10/21 0500  linezolid (ZYVOX) IVPB 600 mg        600 mg 300 mL/hr over 60 Minutes Intravenous Every 12 hours 05/09/21 1050 05/17/21 0959   05/09/21 0500  vancomycin (VANCOREADY) IVPB 1500  mg/300 mL  Status:  Discontinued        1,500 mg 150 mL/hr over 120 Minutes Intravenous Every 12 hours 05/08/21 1353 05/09/21 1050   05/08/21 1500  vancomycin (VANCOREADY) IVPB 2000 mg/400 mL        2,000 mg 200 mL/hr over 120 Minutes Intravenous  Once 05/08/21 1353 05/08/21 1714   05/03/21 1215  penicillin G potassium 12 Million Units in dextrose 5 % 500 mL continuous infusion        12 Million Units 41.7 mL/hr over 12 Hours Intravenous Every 12 hours 05/03/21 1123 05/14/21 0800   05/01/21 2200  vancomycin (VANCOREADY) IVPB 1000 mg/200 mL  Status:  Discontinued        1,000 mg 200 mL/hr over 60 Minutes Intravenous Every 8 hours 05/01/21 1312 05/02/21 0902   05/01/21 1400  vancomycin (VANCOREADY) IVPB 1000 mg/200 mL        1,000 mg 200 mL/hr over 60 Minutes Intravenous NOW 05/01/21 1312 05/01/21 1523   04/30/21 1200  vancomycin (VANCOREADY) IVPB 1000 mg/200 mL  Status:  Discontinued        1,000 mg 200 mL/hr over 60 Minutes Intravenous Every 12 hours 04/29/21 2334 05/01/21 1312   04/30/21 0600  acyclovir (ZOVIRAX) 1,000 mg in dextrose 5 % 150 mL IVPB  Status:  Discontinued        1,000 mg 170 mL/hr over 60 Minutes Intravenous Every 8 hours 04/30/21 0243 04/30/21 1119   04/30/21 0000  ampicillin (OMNIPEN) 2 g in sodium chloride 0.9 % 100 mL IVPB  Status:  Discontinued        2 g 300 mL/hr over 20 Minutes Intravenous Every 4 hours 04/29/21 2330 05/01/21 0958   04/29/21 2345  cefTRIAXone (ROCEPHIN) 2 g in sodium chloride 0.9 % 100 mL IVPB  Status:  Discontinued        2 g 200 mL/hr over 30 Minutes Intravenous Every 12 hours 04/29/21 2330 05/03/21 1123   04/29/21 2345  vancomycin (VANCOREADY) IVPB 2000 mg/400 mL        2,000 mg 200 mL/hr over 120 Minutes Intravenous  Once 04/29/21 2334 04/30/21 0240      Subjective: Without complaints. Denies sob  Objective: Vitals:   05/15/21 2147 05/16/21 0352 05/16/21 0730 05/16/21 1315  BP: 140/73 140/75  127/66  Pulse: 77 72  74  Resp: 18 18   17   Temp: 98.4 F (36.9 C) 98.3 F (36.8 C)  98.3 F (36.8 C)  TempSrc:      SpO2: 100% 91% 96% 95%  Weight:      Height:        Intake/Output Summary (Last 24 hours) at 05/16/2021 1720 Last data filed at 05/16/2021 1600 Gross per 24 hour  Intake 5753.07 ml  Output 3501 ml  Net 2252.07 ml   Filed Weights   05/13/21 0500 05/14/21 0500 05/15/21 0500  Weight: 112.6 kg 110.4 kg 108.5 kg    Examination: General exam: Awake, laying in bed, in nad Respiratory system: Normal respiratory effort, no wheezing Cardiovascular system: regular rate, s1, s2 Gastrointestinal system: Soft, nondistended, positive BS Central nervous system: CN2-12 grossly intact, strength intact Extremities:  Perfused, no clubbing Skin: Normal skin turgor, no notable skin lesions seen Psychiatry: Mood normal // no visual hallucinations   Data Reviewed: I have personally reviewed following labs and imaging studies  CBC: Recent Labs  Lab 05/12/21 0059 05/13/21 0159 05/14/21 0356 05/15/21 0235 05/16/21 0122  WBC 16.8* 15.4* 13.2* 12.6* 12.6*  HGB 13.7 13.8 13.3 13.2 12.8*  HCT 41.6 41.9 39.6 39.5 38.1*  MCV 96.7 96.5 94.3 94.0 95.3  PLT 175 208 205 240 373   Basic Metabolic Panel: Recent Labs  Lab 05/12/21 0059 05/13/21 0159 05/14/21 0356 05/15/21 0235 05/16/21 0122  NA 142 138 137 133* 137  K 3.7 4.0 4.1 3.8 3.8  CL 107 105 103 102 108  CO2 29 26 25 25 25   GLUCOSE 111* 105* 123* 115* 124*  BUN 27* 17 14 12 13   CREATININE 0.76 0.84 0.69 0.65 0.67  CALCIUM 8.0* 8.0* 8.2* 8.1* 8.1*  MG 2.1 2.0  --   --   --   PHOS 3.1  --   --   --   --    GFR: Estimated Creatinine Clearance: 121.5 mL/min (by C-G formula based on SCr of 0.67 mg/dL). Liver Function Tests: Recent Labs  Lab 05/10/21 0500 05/13/21 0159 05/14/21 0356 05/15/21 0235 05/16/21 0122  AST 82* 85* 75* 55* 43*  ALT 86* 131* 148* 134* 117*  ALKPHOS 36* 45 48 46 49  BILITOT 0.5 1.1 0.7 0.8 0.5  PROT 4.7* 5.5* 5.5* 5.9* 5.6*   ALBUMIN 1.7* 1.8* 1.8* 1.9* 1.8*   No results for input(s): LIPASE, AMYLASE in the last 168 hours. No results for input(s): AMMONIA in the last 168 hours. Coagulation Profile: No results for input(s): INR, PROTIME in the last 168 hours. Cardiac Enzymes: No results for input(s): CKTOTAL, CKMB, CKMBINDEX, TROPONINI in the last 168 hours. BNP (last 3 results) No results for input(s): PROBNP in the last 8760 hours. HbA1C: No results for input(s): HGBA1C in the last 72 hours. CBG: Recent Labs  Lab 05/16/21 0114 05/16/21 0348 05/16/21 0735 05/16/21 1323 05/16/21 1531  GLUCAP 134* 132* 136* 133* 101*   Lipid Profile: No results for input(s): CHOL, HDL, LDLCALC, TRIG, CHOLHDL, LDLDIRECT in the last 72 hours. Thyroid Function Tests: No results for input(s): TSH, T4TOTAL, FREET4, T3FREE, THYROIDAB in the last 72 hours. Anemia Panel: No results for input(s): VITAMINB12, FOLATE, FERRITIN, TIBC, IRON, RETICCTPCT in the last 72 hours. Sepsis Labs: No results for input(s): PROCALCITON, LATICACIDVEN in the last 168 hours.  Recent Results (from the past 240 hour(s))  Culture, Respiratory w Gram Stain     Status: None   Collection Time: 05/07/21 11:58 AM   Specimen: Tracheal Aspirate; Respiratory  Result Value Ref Range Status   Specimen Description TRACHEAL ASPIRATE  Final   Special Requests Immunocompromised  Final   Gram Stain   Final    ABUNDANT WBC PRESENT, PREDOMINANTLY PMN ABUNDANT GRAM POSITIVE COCCI RARE YEAST Performed at [facility], [address], [place], [place] [address]    Culture   Final    MODERATE METHICILLIN RESISTANT STAPHYLOCOCCUS AUREUS CORRECTED ON 05/27 AT 0824: PREVIOUSLY REPORTED AS MODERATE STAPHYLOCOCCUS AUREUS   Report Status 05/09/2021 FINAL  Final   Organism ID, Bacteria METHICILLIN RESISTANT STAPHYLOCOCCUS AUREUS  Final      Susceptibility   Methicillin resistant staphylococcus aureus - MIC*    CIPROFLOXACIN >=8 RESISTANT Resistant      ERYTHROMYCIN >=8 RESISTANT Resistant     GENTAMICIN <=0.5 SENSITIVE Sensitive  OXACILLIN >=4 RESISTANT Resistant     TETRACYCLINE <=1 SENSITIVE Sensitive     VANCOMYCIN 1 SENSITIVE Sensitive     TRIMETH/SULFA >=320 RESISTANT Resistant     CLINDAMYCIN <=0.25 SENSITIVE Sensitive     RIFAMPIN <=0.5 SENSITIVE Sensitive     Inducible Clindamycin NEGATIVE Sensitive     * MODERATE METHICILLIN RESISTANT STAPHYLOCOCCUS AUREUS CORRECTED ON 05/27 AT 8341: PREVIOUSLY REPORTED AS MODERATE STAPHYLOCOCCUS AUREUS  Culture, blood (Routine X 2) w Reflex to ID Panel     Status: None   Collection Time: 05/07/21  3:50 PM   Specimen: BLOOD  Result Value Ref Range Status   Specimen Description BLOOD LEFT ANTECUBITAL  Final   Special Requests   Final    BOTTLES DRAWN AEROBIC AND ANAEROBIC Blood Culture results may not be optimal due to an inadequate volume of blood received in culture bottles   Culture   Final    NO GROWTH 5 DAYS Performed at [facility], [place] [address] [place], [place] [address]    Report Status 05/12/2021 FINAL  Final  Culture, blood (Routine X 2) w Reflex to ID Panel     Status: None   Collection Time: 05/07/21  3:57 PM   Specimen: BLOOD  Result Value Ref Range Status   Specimen Description BLOOD LEFT ANTECUBITAL  Final   Special Requests   Final    BOTTLES DRAWN AEROBIC AND ANAEROBIC Blood Culture adequate volume   Culture   Final    NO GROWTH 5 DAYS Performed at [facility], [place] [address], [place],  [address]    Report Status 05/12/2021 FINAL  Final     Radiology Studies: US Abdomen Limited RUQ (LIVER/GB)  Result Date: 05/14/2021 CLINICAL DATA:  60 year old male with elevated LFTs. EXAM: ULTRASOUND ABDOMEN LIMITED RIGHT UPPER QUADRANT COMPARISON:  None. FINDINGS: Gallbladder: Cholecystectomy. Common bile duct: Diameter: 5 mm Liver: No focal lesion identified. Within normal limits in parenchymal echogenicity. Portal vein is patent on color Doppler imaging  with normal direction of blood flow towards the liver. Other: None. IMPRESSION: Cholecystectomy, otherwise unremarkable right upper quadrant ultrasound. Electronically Signed   By: Anner Crete M.D.   On: 05/14/2021 23:49    Scheduled Meds: . amiodarone  200 mg Per Tube BID  . Chlorhexidine Gluconate Cloth  6 each Topical Q0600  . doxazosin  2 mg Per Tube Daily  . feeding supplement (PROSource TF)  45 mL Per Tube QID  . free water  250 mL Per Tube Q6H  . heparin injection (subcutaneous)  5,000 Units Subcutaneous Q8H  . insulin aspart  0-20 Units Subcutaneous TID WC  . mouth rinse  15 mL Mouth Rinse BID  . sodium chloride flush  10-40 mL Intracatheter Q12H   Continuous Infusions: . dextrose 50 mL/hr at 05/15/21 1755  . feeding supplement (VITAL 1.5 CAL) 1,000 mL (05/15/21 2258)  . linezolid (ZYVOX) IV 600 mg (05/16/21 0910)     LOS: 16 days   Marylu Lund, MD [facility] Pager On Amion  If 7PM-7AM, please contact night-coverage 05/16/2021, 5:20 PM

## 2021-05-16 NOTE — Progress Notes (Signed)
Inpatient Rehabilitation Admissions Coordinator  We have received insurance approval for CIR, but no bed available . We will follow up on Monday.  Danne Baxter, RN, MSN Rehab Admissions Coordinator [phone] 05/16/2021 1:42 PM

## 2021-05-17 LAB — CBC
HCT: 39.1 % (ref 39.0–52.0)
Hemoglobin: 13.3 g/dL (ref 13.0–17.0)
MCH: 32 pg (ref 26.0–34.0)
MCHC: 34 g/dL (ref 30.0–36.0)
MCV: 94 fL (ref 80.0–100.0)
Platelets: 258 10*3/uL (ref 150–400)
RBC: 4.16 MIL/uL — ABNORMAL LOW (ref 4.22–5.81)
RDW: 13.2 % (ref 11.5–15.5)
WBC: 13 10*3/uL — ABNORMAL HIGH (ref 4.0–10.5)
nRBC: 0 % (ref 0.0–0.2)

## 2021-05-17 LAB — COMPREHENSIVE METABOLIC PANEL
ALT: 101 U/L — ABNORMAL HIGH (ref 0–44)
AST: 32 U/L (ref 15–41)
Albumin: 2 g/dL — ABNORMAL LOW (ref 3.5–5.0)
Alkaline Phosphatase: 52 U/L (ref 38–126)
Anion gap: 6 (ref 5–15)
BUN: 15 mg/dL (ref 8–23)
CO2: 24 mmol/L (ref 22–32)
Calcium: 8.4 mg/dL — ABNORMAL LOW (ref 8.9–10.3)
Chloride: 107 mmol/L (ref 98–111)
Creatinine, Ser: 0.61 mg/dL (ref 0.61–1.24)
GFR, Estimated: >60 mL/min (ref >60)
Glucose, Bld: 114 mg/dL — ABNORMAL HIGH (ref 70–99)
Potassium: 4 mmol/L (ref 3.5–5.1)
Sodium: 137 mmol/L (ref 135–145)
Total Bilirubin: 0.3 mg/dL (ref 0.3–1.2)
Total Protein: 6.1 g/dL — ABNORMAL LOW (ref 6.5–8.1)

## 2021-05-17 LAB — GLUCOSE, CAPILLARY
Glucose-Capillary: 101 mg/dL — ABNORMAL HIGH (ref 70–99)
Glucose-Capillary: 104 mg/dL — ABNORMAL HIGH (ref 70–99)
Glucose-Capillary: 107 mg/dL — ABNORMAL HIGH (ref 70–99)
Glucose-Capillary: 118 mg/dL — ABNORMAL HIGH (ref 70–99)
Glucose-Capillary: 127 mg/dL — ABNORMAL HIGH (ref 70–99)
Glucose-Capillary: 98 mg/dL (ref 70–99)
Glucose-Capillary: 98 mg/dL (ref 70–99)

## 2021-05-17 MED ORDER — NYSTATIN 100000 UNIT/GM EX CREA
TOPICAL_CREAM | Freq: Two times a day (BID) | CUTANEOUS | Status: DC
  Filled 2021-05-17: qty 15

## 2021-05-17 NOTE — Progress Notes (Signed)
PROGRESS NOTE    Hayden Mcbride  ZOX:[id number] DOB: Aug 01, 1961 DOA: 04/29/2021 PCP: Robyne Peers, MD    Brief Narrative:  60 year old male with hypertension, OSA, unspecified thyroid disease, comes into the hospital and is admitted on 5/17 due to sudden onset of headache, hearing loss and combativeness/confusion.  He recently returned from a 2-week trip to [place].  He was intubated in the ED and was admitted to the ICU.  Further work-up with an LP showed evidence of having pneumococcal meningitis.  ID consulted.  He was eventually extubated and transferred to the hospitalist service on 5/30.  Assessment & Plan:   Principal Problem:   Pneumococcal meningitis Active Problems:   Encounter for orogastric tube placement   AMS (altered mental status)   Meningitis   Acute respiratory failure with hypoxemia (HCC)   Status post peripherally inserted central catheter (PICC) central line placement   FUO (fever of unknown origin)   Staphylococcus aureus pneumonia (HCC)   Aspiration into respiratory tract   Atelectasis  Pneumococcal meningitis-he underwent an LP on 5/18 and cultures showed Streptococcus pneumoniae, pansensitive.  Completed course of  high-dose penicillin, ID had been following  Active Problems MRSA pneumonia-he was having fevers, cultured and tracheal aspirate cultures on 5/25 showed MRSA.  He was initially on vancomycin but transition to Zyvox because of an elevation in creatinine.  Respiratory status is stable today -completed 7 day course of linezolid as of today  Acute hypoxic respiratory failure, ventilator dependent respiratory failure -improving, weaned to room air -Coarse breath sounds, however pt noted to have mucus in posterior pharynx, lung sounds remain clear, improved -Recently cleared for PO diet -Have consulted dietitian to assist with weaning off tube feeding as tolerated  Paroxysmal A. fib-apparently had an episode of A. fib and he was placed on  amiodarone. Currently he is in sinus rhythm.  He has not been anticoagulated due to bacterial meningitis and high risk of ICH.  His A. fib is likely in the setting of acute illness, if he remains in sinus and does not have recurrence can probably avoid anticoagulation altogether.  His CHA2DS2-VASc score is 1 for hypertension.  2D echo done on 5/27 showed an EF of 65-70%, normal LVEF without WMA, normal RV.  Acute toxic encephalopathy -due to #1, supportive care.  Remains alert, conversant, follows commands  Thyroid disease-mentioned in the chart however TSH unremarkable.  Acute kidney injury-on 5/27 creatinine acutely increased to 1.6, possibly due to vancomycin.  Cr currently stable  Hypernatremia-resolved, have d/c IVF as pt is cleared for diet   Leukocytosis-reviewed, improved  LFT elevation-overall stable for the past week, mild. Cont to follow LFT trends. RUQ [place] reviewed, s/p cholecystectomy, unremarkable  Hypertension-Home medications include amlodipine 5 mg, losartan 100 mg.  Blood pressure is currently stable  Hypokalemia-monitor and replete as necessary  Acute urinary retention-failed voiding trial on 5/27, he was placed on doxazosin, continue, consider voiding trial down the road  DVT prophylaxis: Heparin subq Code Status: Full Family Communication: Pt in room, family currently at bedside  Status is: Inpatient  Remains inpatient appropriate because:Inpatient level of care appropriate due to severity of illness   Dispo: The patient is from: Home              Anticipated d/c is to: CIR              Patient currently is not medically stable to d/c.   Difficult to place patient No   Consultants:   PCCM  ID  Procedures:   2d echo  LP  Antimicrobials: Anti-infectives (From admission, onward)   Start     Dose/Rate Route Frequency Ordered Stop   05/10/21 0500  linezolid (ZYVOX) IVPB 600 mg        600 mg 300 mL/hr over 60 Minutes Intravenous Every 12  hours 05/09/21 1050 05/16/21 2256   05/09/21 0500  vancomycin (VANCOREADY) IVPB 1500 mg/300 mL  Status:  Discontinued        1,500 mg 150 mL/hr over 120 Minutes Intravenous Every 12 hours 05/08/21 1353 05/09/21 1050   05/08/21 1500  vancomycin (VANCOREADY) IVPB 2000 mg/400 mL        2,000 mg 200 mL/hr over 120 Minutes Intravenous  Once 05/08/21 1353 05/08/21 1714   05/03/21 1215  penicillin G potassium 12 Million Units in dextrose 5 % 500 mL continuous infusion        12 Million Units 41.7 mL/hr over 12 Hours Intravenous Every 12 hours 05/03/21 1123 05/14/21 0800   05/01/21 2200  vancomycin (VANCOREADY) IVPB 1000 mg/200 mL  Status:  Discontinued        1,000 mg 200 mL/hr over 60 Minutes Intravenous Every 8 hours 05/01/21 1312 05/02/21 0902   05/01/21 1400  vancomycin (VANCOREADY) IVPB 1000 mg/200 mL        1,000 mg 200 mL/hr over 60 Minutes Intravenous NOW 05/01/21 1312 05/01/21 1523   04/30/21 1200  vancomycin (VANCOREADY) IVPB 1000 mg/200 mL  Status:  Discontinued        1,000 mg 200 mL/hr over 60 Minutes Intravenous Every 12 hours 04/29/21 2334 05/01/21 1312   04/30/21 0600  acyclovir (ZOVIRAX) 1,000 mg in dextrose 5 % 150 mL IVPB  Status:  Discontinued        1,000 mg 170 mL/hr over 60 Minutes Intravenous Every 8 hours 04/30/21 0243 04/30/21 1119   04/30/21 0000  ampicillin (OMNIPEN) 2 g in sodium chloride 0.9 % 100 mL IVPB  Status:  Discontinued        2 g 300 mL/hr over 20 Minutes Intravenous Every 4 hours 04/29/21 2330 05/01/21 0958   04/29/21 2345  cefTRIAXone (ROCEPHIN) 2 g in sodium chloride 0.9 % 100 mL IVPB  Status:  Discontinued        2 g 200 mL/hr over 30 Minutes Intravenous Every 12 hours 04/29/21 2330 05/03/21 1123   04/29/21 2345  vancomycin (VANCOREADY) IVPB 2000 mg/400 mL        2,000 mg 200 mL/hr over 120 Minutes Intravenous  Once 04/29/21 2334 04/30/21 0240      Subjective: Without complaints. Eager to resume therapy  Objective: Vitals:   05/16/21 1315  05/16/21 2130 05/17/21 0533 05/17/21 0749  BP: 127/66 (!) 144/81 (!) 143/83 (!) 147/80  Pulse: 74 75 78 70  Resp: 17 15 17 18   Temp: 98.3 F (36.8 C) 98.6 F (37 C) 97.6 F (36.4 C) 98.5 F (36.9 C)  TempSrc:  Oral Oral Oral  SpO2: 95% 99% 98% 98%  Weight:   104.7 kg   Height:        Intake/Output Summary (Last 24 hours) at 05/17/2021 1647 Last data filed at 05/17/2021 1422 Gross per 24 hour  Intake 1965.46 ml  Output 4375 ml  Net -2409.54 ml   Filed Weights   05/14/21 0500 05/15/21 0500 05/17/21 0533  Weight: 110.4 kg 108.5 kg 104.7 kg    Examination: General exam: Conversant, in no acute distress Respiratory system: normal chest rise, clear, no audible wheezing Cardiovascular  system: regular rhythm, s1-s2 Gastrointestinal system: Nondistended, nontender, pos BS, NG in place Central nervous system: No seizures, no tremors Extremities: No cyanosis, no joint deformities Skin: No rashes, no pallor Psychiatry: Affect normal // no auditory hallucinations   Data Reviewed: I have personally reviewed following labs and imaging studies  CBC: Recent Labs  Lab 05/13/21 0159 05/14/21 0356 05/15/21 0235 05/16/21 0122 05/17/21 0226  WBC 15.4* 13.2* 12.6* 12.6* 13.0*  HGB 13.8 13.3 13.2 12.8* 13.3  HCT 41.9 39.6 39.5 38.1* 39.1  MCV 96.5 94.3 94.0 95.3 94.0  PLT 208 205 240 260 376   Basic Metabolic Panel: Recent Labs  Lab 05/12/21 0059 05/13/21 0159 05/14/21 0356 05/15/21 0235 05/16/21 0122 05/17/21 0226  NA 142 138 137 133* 137 137  K 3.7 4.0 4.1 3.8 3.8 4.0  CL 107 105 103 102 108 107  CO2 29 26 25 25 25 24   GLUCOSE 111* 105* 123* 115* 124* 114*  BUN 27* 17 14 12 13 15   CREATININE 0.76 0.84 0.69 0.65 0.67 0.61  CALCIUM 8.0* 8.0* 8.2* 8.1* 8.1* 8.4*  MG 2.1 2.0  --   --   --   --   PHOS 3.1  --   --   --   --   --    GFR: Estimated Creatinine Clearance: 119.5 mL/min (by C-G formula based on SCr of 0.61 mg/dL). Liver Function Tests: Recent Labs  Lab  05/13/21 0159 05/14/21 0356 05/15/21 0235 05/16/21 0122 05/17/21 0226  AST 85* 75* 55* 43* 32  ALT 131* 148* 134* 117* 101*  ALKPHOS 45 48 46 49 52  BILITOT 1.1 0.7 0.8 0.5 0.3  PROT 5.5* 5.5* 5.9* 5.6* 6.1*  ALBUMIN 1.8* 1.8* 1.9* 1.8* 2.0*   No results for input(s): LIPASE, AMYLASE in the last 168 hours. No results for input(s): AMMONIA in the last 168 hours. Coagulation Profile: No results for input(s): INR, PROTIME in the last 168 hours. Cardiac Enzymes: No results for input(s): CKTOTAL, CKMB, CKMBINDEX, TROPONINI in the last 168 hours. BNP (last 3 results) No results for input(s): PROBNP in the last 8760 hours. HbA1C: No results for input(s): HGBA1C in the last 72 hours. CBG: Recent Labs  Lab 05/17/21 0058 05/17/21 0556 05/17/21 0741 05/17/21 1122 05/17/21 1608  GLUCAP 104* 118* 127* 98 98   Lipid Profile: No results for input(s): CHOL, HDL, LDLCALC, TRIG, CHOLHDL, LDLDIRECT in the last 72 hours. Thyroid Function Tests: No results for input(s): TSH, T4TOTAL, FREET4, T3FREE, THYROIDAB in the last 72 hours. Anemia Panel: No results for input(s): VITAMINB12, FOLATE, FERRITIN, TIBC, IRON, RETICCTPCT in the last 72 hours. Sepsis Labs: No results for input(s): PROCALCITON, LATICACIDVEN in the last 168 hours.  No results found for this or any previous visit (from the past 240 hour(s)).   Radiology Studies: No results found.  Scheduled Meds: . amiodarone  200 mg Per Tube BID  . Chlorhexidine Gluconate Cloth  6 each Topical Q0600  . doxazosin  2 mg Per Tube Daily  . feeding supplement (PROSource TF)  45 mL Per Tube QID  . free water  250 mL Per Tube Q6H  . heparin injection (subcutaneous)  5,000 Units Subcutaneous Q8H  . insulin aspart  0-20 Units Subcutaneous TID WC  . mouth rinse  15 mL Mouth Rinse BID  . nystatin cream   Topical BID  . sodium chloride flush  10-40 mL Intracatheter Q12H   Continuous Infusions: . dextrose 50 mL/hr at 05/17/21 1118  . feeding  supplement (  VITAL 1.5 CAL) 1,000 mL (05/16/21 2235)     LOS: 17 days   Marylu Lund, MD [facility] Pager On Amion  If 7PM-7AM, please contact night-coverage 05/17/2021, 4:47 PM

## 2021-05-17 NOTE — Plan of Care (Signed)

## 2021-05-18 LAB — COMPREHENSIVE METABOLIC PANEL
ALT: 82 U/L — ABNORMAL HIGH (ref 0–44)
AST: 26 U/L (ref 15–41)
Albumin: 2 g/dL — ABNORMAL LOW (ref 3.5–5.0)
Alkaline Phosphatase: 48 U/L (ref 38–126)
Anion gap: 6 (ref 5–15)
BUN: 15 mg/dL (ref 8–23)
CO2: 26 mmol/L (ref 22–32)
Calcium: 8.4 mg/dL — ABNORMAL LOW (ref 8.9–10.3)
Chloride: 105 mmol/L (ref 98–111)
Creatinine, Ser: 0.68 mg/dL (ref 0.61–1.24)
GFR, Estimated: >60 mL/min (ref >60)
Glucose, Bld: 132 mg/dL — ABNORMAL HIGH (ref 70–99)
Potassium: 4.1 mmol/L (ref 3.5–5.1)
Sodium: 137 mmol/L (ref 135–145)
Total Bilirubin: 0.7 mg/dL (ref 0.3–1.2)
Total Protein: 5.9 g/dL — ABNORMAL LOW (ref 6.5–8.1)

## 2021-05-18 LAB — GLUCOSE, CAPILLARY
Glucose-Capillary: 114 mg/dL — ABNORMAL HIGH (ref 70–99)
Glucose-Capillary: 150 mg/dL — ABNORMAL HIGH (ref 70–99)
Glucose-Capillary: 106 mg/dL — ABNORMAL HIGH (ref 70–99)
Glucose-Capillary: 106 mg/dL — ABNORMAL HIGH (ref 70–99)
Glucose-Capillary: 125 mg/dL — ABNORMAL HIGH (ref 70–99)
Glucose-Capillary: 112 mg/dL — ABNORMAL HIGH (ref 70–99)
Glucose-Capillary: 117 mg/dL — ABNORMAL HIGH (ref 70–99)

## 2021-05-18 LAB — CBC
HCT: 40.7 % (ref 39.0–52.0)
Hemoglobin: 13.5 g/dL (ref 13.0–17.0)
MCH: 31.5 pg (ref 26.0–34.0)
MCHC: 33.2 g/dL (ref 30.0–36.0)
MCV: 95.1 fL (ref 80.0–100.0)
Platelets: 279 10*3/uL (ref 150–400)
RBC: 4.28 MIL/uL (ref 4.22–5.81)
RDW: 13.3 % (ref 11.5–15.5)
WBC: 13.9 10*3/uL — ABNORMAL HIGH (ref 4.0–10.5)
nRBC: 0 % (ref 0.0–0.2)

## 2021-05-18 NOTE — Progress Notes (Signed)
PROGRESS NOTE    Hayden Mcbride  MGN:[id number] DOB: Jan 31, 1961 DOA: 04/29/2021 PCP: Robyne Peers, MD    Brief Narrative:  60 year old male with hypertension, OSA, unspecified thyroid disease, comes into the hospital and is admitted on 5/17 due to sudden onset of headache, hearing loss and combativeness/confusion.  He recently returned from a 2-week trip to [place].  He was intubated in the ED and was admitted to the ICU.  Further work-up with an LP showed evidence of having pneumococcal meningitis.  ID consulted.  He was eventually extubated and transferred to the hospitalist service on 5/30.  Assessment & Plan:   Principal Problem:   Pneumococcal meningitis Active Problems:   Encounter for orogastric tube placement   AMS (altered mental status)   Meningitis   Acute respiratory failure with hypoxemia (HCC)   Status post peripherally inserted central catheter (PICC) central line placement   FUO (fever of unknown origin)   Staphylococcus aureus pneumonia (HCC)   Aspiration into respiratory tract   Atelectasis  Pneumococcal meningitis-he underwent an LP on 5/18 and cultures showed Streptococcus pneumoniae, pansensitive.  Completed course of  high-dose penicillin, ID had been following earlier  Active Problems MRSA pneumonia-he was having fevers, cultured and tracheal aspirate cultures on 5/25 showed MRSA.  He was initially on vancomycin but transition to Zyvox because of an elevation in creatinine.  Respiratory status is stable today -completed 7 day course of linezolid as of 6/4  Acute hypoxic respiratory failure, ventilator dependent respiratory failure -improving, weaned to room air -Coarse breath sounds, however pt noted to have mucus in posterior pharynx, lung sounds remain clear, improved -Recently cleared for PO diet -Have consulted dietitian to assist with weaning off tube feeding as tolerated  Paroxysmal A. fib-apparently had an episode of A. fib and he was placed  on amiodarone. Currently he is in sinus rhythm.  He has not been anticoagulated due to bacterial meningitis and high risk of ICH.  His A. fib is likely in the setting of acute illness, if he remains in sinus and does not have recurrence can probably avoid anticoagulation altogether.  His CHA2DS2-VASc score is 1 for hypertension.  2D echo done on 5/27 showed an EF of 65-70%, normal LVEF without WMA, normal RV.  Acute toxic encephalopathy -due to #1, supportive care.  Seems now much improved, conversant and following commands  Thyroid disease-mentioned in the chart however TSH unremarkable.  Acute kidney injury-on 5/27 creatinine acutely increased to 1.6, possibly due to vancomycin.  Cr currently stable  Hypernatremia-resolved, have d/c IVF as pt is cleared for diet   Leukocytosis-reviewed, improved  LFT elevation-overall stable for the past week, mild. Cont to follow LFT trends. RUQ [place] reviewed, s/p cholecystectomy, unremarkable  Hypertension-Home medications include amlodipine 5 mg, losartan 100 mg.  Blood pressure is currently stable  Hypokalemia-monitor and replete as necessary  Acute urinary retention-failed voiding trial on 5/27, he was placed on doxazosin -as pt is much more alert and oriented, will proceed with voiding trial and d/c cath  DVT prophylaxis: Heparin subq Code Status: Full Family Communication: Pt in room, family currently at bedside  Status is: Inpatient  Remains inpatient appropriate because:Inpatient level of care appropriate due to severity of illness   Dispo: The patient is from: Home              Anticipated d/c is to: CIR              Patient currently is not medically stable to d/c.  Difficult to place patient No   Consultants:   PCCM  ID  Procedures:   2d echo  LP  Antimicrobials: Anti-infectives (From admission, onward)   Start     Dose/Rate Route Frequency Ordered Stop   05/10/21 0500  linezolid (ZYVOX) IVPB 600 mg        600  mg 300 mL/hr over 60 Minutes Intravenous Every 12 hours 05/09/21 1050 05/17/21 1915   05/09/21 0500  vancomycin (VANCOREADY) IVPB 1500 mg/300 mL  Status:  Discontinued        1,500 mg 150 mL/hr over 120 Minutes Intravenous Every 12 hours 05/08/21 1353 05/09/21 1050   05/08/21 1500  vancomycin (VANCOREADY) IVPB 2000 mg/400 mL        2,000 mg 200 mL/hr over 120 Minutes Intravenous  Once 05/08/21 1353 05/08/21 1714   05/03/21 1215  penicillin G potassium 12 Million Units in dextrose 5 % 500 mL continuous infusion        12 Million Units 41.7 mL/hr over 12 Hours Intravenous Every 12 hours 05/03/21 1123 05/14/21 0800   05/01/21 2200  vancomycin (VANCOREADY) IVPB 1000 mg/200 mL  Status:  Discontinued        1,000 mg 200 mL/hr over 60 Minutes Intravenous Every 8 hours 05/01/21 1312 05/02/21 0902   05/01/21 1400  vancomycin (VANCOREADY) IVPB 1000 mg/200 mL        1,000 mg 200 mL/hr over 60 Minutes Intravenous NOW 05/01/21 1312 05/01/21 1523   04/30/21 1200  vancomycin (VANCOREADY) IVPB 1000 mg/200 mL  Status:  Discontinued        1,000 mg 200 mL/hr over 60 Minutes Intravenous Every 12 hours 04/29/21 2334 05/01/21 1312   04/30/21 0600  acyclovir (ZOVIRAX) 1,000 mg in dextrose 5 % 150 mL IVPB  Status:  Discontinued        1,000 mg 170 mL/hr over 60 Minutes Intravenous Every 8 hours 04/30/21 0243 04/30/21 1119   04/30/21 0000  ampicillin (OMNIPEN) 2 g in sodium chloride 0.9 % 100 mL IVPB  Status:  Discontinued        2 g 300 mL/hr over 20 Minutes Intravenous Every 4 hours 04/29/21 2330 05/01/21 0958   04/29/21 2345  cefTRIAXone (ROCEPHIN) 2 g in sodium chloride 0.9 % 100 mL IVPB  Status:  Discontinued        2 g 200 mL/hr over 30 Minutes Intravenous Every 12 hours 04/29/21 2330 05/03/21 1123   04/29/21 2345  vancomycin (VANCOREADY) IVPB 2000 mg/400 mL        2,000 mg 200 mL/hr over 120 Minutes Intravenous  Once 04/29/21 2334 04/30/21 0240      Subjective: Reports feeling better  today  Objective: Vitals:   05/16/21 2130 05/17/21 0533 05/17/21 0749 05/18/21 1015  BP: (!) 144/81 (!) 143/83 (!) 147/80 135/84  Pulse: 75 78 70 73  Resp: 15 17 18  (!) 22  Temp: 98.6 F (37 C) 97.6 F (36.4 C) 98.5 F (36.9 C) 97.9 F (36.6 C)  TempSrc: Oral Oral Oral Oral  SpO2: 99% 98% 98% 98%  Weight:  104.7 kg    Height:        Intake/Output Summary (Last 24 hours) at 05/18/2021 1606 Last data filed at 05/18/2021 4696 Gross per 24 hour  Intake 2418.42 ml  Output 2851 ml  Net -432.58 ml   Filed Weights   05/14/21 0500 05/15/21 0500 05/17/21 0533  Weight: 110.4 kg 108.5 kg 104.7 kg    Examination: General exam: Awake, laying in bed,  in nad, NG in place Respiratory system: Normal respiratory effort, no wheezing Cardiovascular system: regular rate, s1, s2 Gastrointestinal system: Soft, nondistended, positive BS Central nervous system: CN2-12 grossly intact, strength intact Extremities: Perfused, no clubbing Skin: Normal skin turgor, no notable skin lesions seen Psychiatry: Mood normal // no visual hallucinations   Data Reviewed: I have personally reviewed following labs and imaging studies  CBC: Recent Labs  Lab 05/14/21 0356 05/15/21 0235 05/16/21 0122 05/17/21 0226 05/18/21 0221  WBC 13.2* 12.6* 12.6* 13.0* 13.9*  HGB 13.3 13.2 12.8* 13.3 13.5  HCT 39.6 39.5 38.1* 39.1 40.7  MCV 94.3 94.0 95.3 94.0 95.1  PLT 205 240 260 258 211   Basic Metabolic Panel: Recent Labs  Lab 05/12/21 0059 05/13/21 0159 05/14/21 0356 05/15/21 0235 05/16/21 0122 05/17/21 0226 05/18/21 0221  NA 142 138 137 133* 137 137 137  K 3.7 4.0 4.1 3.8 3.8 4.0 4.1  CL 107 105 103 102 108 107 105  CO2 29 26 25 25 25 24 26   GLUCOSE 111* 105* 123* 115* 124* 114* 132*  BUN 27* 17 14 12 13 15 15   CREATININE 0.76 0.84 0.69 0.65 0.67 0.61 0.68  CALCIUM 8.0* 8.0* 8.2* 8.1* 8.1* 8.4* 8.4*  MG 2.1 2.0  --   --   --   --   --   PHOS 3.1  --   --   --   --   --   --    GFR: Estimated  Creatinine Clearance: 119.5 mL/min (by C-G formula based on SCr of 0.68 mg/dL). Liver Function Tests: Recent Labs  Lab 05/14/21 0356 05/15/21 0235 05/16/21 0122 05/17/21 0226 05/18/21 0221  AST 75* 55* 43* 32 26  ALT 148* 134* 117* 101* 82*  ALKPHOS 48 46 49 52 48  BILITOT 0.7 0.8 0.5 0.3 0.7  PROT 5.5* 5.9* 5.6* 6.1* 5.9*  ALBUMIN 1.8* 1.9* 1.8* 2.0* 2.0*   No results for input(s): LIPASE, AMYLASE in the last 168 hours. No results for input(s): AMMONIA in the last 168 hours. Coagulation Profile: No results for input(s): INR, PROTIME in the last 168 hours. Cardiac Enzymes: No results for input(s): CKTOTAL, CKMB, CKMBINDEX, TROPONINI in the last 168 hours. BNP (last 3 results) No results for input(s): PROBNP in the last 8760 hours. HbA1C: No results for input(s): HGBA1C in the last 72 hours. CBG: Recent Labs  Lab 05/18/21 0437 05/18/21 0739 05/18/21 1005 05/18/21 1212 05/18/21 1537  GLUCAP 114* 150* 106* 106* 125*   Lipid Profile: No results for input(s): CHOL, HDL, LDLCALC, TRIG, CHOLHDL, LDLDIRECT in the last 72 hours. Thyroid Function Tests: No results for input(s): TSH, T4TOTAL, FREET4, T3FREE, THYROIDAB in the last 72 hours. Anemia Panel: No results for input(s): VITAMINB12, FOLATE, FERRITIN, TIBC, IRON, RETICCTPCT in the last 72 hours. Sepsis Labs: No results for input(s): PROCALCITON, LATICACIDVEN in the last 168 hours.  No results found for this or any previous visit (from the past 240 hour(s)).   Radiology Studies: No results found.  Scheduled Meds: . amiodarone  200 mg Per Tube BID  . Chlorhexidine Gluconate Cloth  6 each Topical Q0600  . doxazosin  2 mg Per Tube Daily  . feeding supplement (PROSource TF)  45 mL Per Tube QID  . free water  250 mL Per Tube Q6H  . heparin injection (subcutaneous)  5,000 Units Subcutaneous Q8H  . insulin aspart  0-20 Units Subcutaneous TID WC  . mouth rinse  15 mL Mouth Rinse BID  . nystatin cream  Topical BID  .  sodium chloride flush  10-40 mL Intracatheter Q12H   Continuous Infusions: . feeding supplement (VITAL 1.5 CAL) 1,000 mL (05/18/21 1319)     LOS: 18 days   Marylu Lund, MD [facility] Pager On Amion  If 7PM-7AM, please contact night-coverage 05/18/2021, 4:06 PM

## 2021-05-19 ENCOUNTER — Other Ambulatory Visit: Payer: Self-pay

## 2021-05-19 ENCOUNTER — Inpatient Hospital Stay (HOSPITAL_COMMUNITY): Payer: Commercial Managed Care - PPO

## 2021-05-19 ENCOUNTER — Inpatient Hospital Stay (HOSPITAL_COMMUNITY)
Admission: RE | Admit: 2021-05-19 | Discharge: 2021-05-30 | DRG: 945 | Disposition: A | Discharge status: Home Health | Payer: Commercial Managed Care - PPO | Source: Intra-hospital | Attending: Physical Medicine & Rehabilitation | Admitting: Physical Medicine & Rehabilitation

## 2021-05-19 ENCOUNTER — Encounter (HOSPITAL_COMMUNITY): Payer: Self-pay | Admitting: Physical Medicine & Rehabilitation

## 2021-05-19 DIAGNOSIS — H409 Unspecified glaucoma: Secondary | ICD-10-CM | POA: Diagnosis present

## 2021-05-19 DIAGNOSIS — Z8614 Personal history of Methicillin resistant Staphylococcus aureus infection: Secondary | ICD-10-CM

## 2021-05-19 DIAGNOSIS — R5381 Other malaise: Principal | ICD-10-CM | POA: Diagnosis present

## 2021-05-19 DIAGNOSIS — G001 Pneumococcal meningitis: Secondary | ICD-10-CM | POA: Diagnosis present

## 2021-05-19 DIAGNOSIS — Z87891 Personal history of nicotine dependence: Secondary | ICD-10-CM

## 2021-05-19 DIAGNOSIS — B370 Candidal stomatitis: Secondary | ICD-10-CM | POA: Diagnosis present

## 2021-05-19 DIAGNOSIS — Z79899 Other long term (current) drug therapy: Secondary | ICD-10-CM

## 2021-05-19 DIAGNOSIS — I10 Essential (primary) hypertension: Secondary | ICD-10-CM | POA: Diagnosis present

## 2021-05-19 DIAGNOSIS — I4891 Unspecified atrial fibrillation: Secondary | ICD-10-CM | POA: Diagnosis present

## 2021-05-19 DIAGNOSIS — R4182 Altered mental status, unspecified: Secondary | ICD-10-CM | POA: Diagnosis not present

## 2021-05-19 DIAGNOSIS — Z8661 Personal history of infections of the central nervous system: Secondary | ICD-10-CM | POA: Diagnosis not present

## 2021-05-19 DIAGNOSIS — E279 Disorder of adrenal gland, unspecified: Secondary | ICD-10-CM | POA: Diagnosis present

## 2021-05-19 DIAGNOSIS — R1310 Dysphagia, unspecified: Secondary | ICD-10-CM | POA: Diagnosis present

## 2021-05-19 DIAGNOSIS — G4700 Insomnia, unspecified: Secondary | ICD-10-CM | POA: Diagnosis present

## 2021-05-19 DIAGNOSIS — E669 Obesity, unspecified: Secondary | ICD-10-CM | POA: Diagnosis present

## 2021-05-19 DIAGNOSIS — R471 Dysarthria and anarthria: Secondary | ICD-10-CM | POA: Diagnosis present

## 2021-05-19 DIAGNOSIS — J9601 Acute respiratory failure with hypoxia: Secondary | ICD-10-CM | POA: Diagnosis not present

## 2021-05-19 DIAGNOSIS — E8809 Other disorders of plasma-protein metabolism, not elsewhere classified: Secondary | ICD-10-CM | POA: Diagnosis present

## 2021-05-19 DIAGNOSIS — Z6832 Body mass index (BMI) 32.0-32.9, adult: Secondary | ICD-10-CM

## 2021-05-19 DIAGNOSIS — Z9049 Acquired absence of other specified parts of digestive tract: Secondary | ICD-10-CM

## 2021-05-19 DIAGNOSIS — R269 Unspecified abnormalities of gait and mobility: Secondary | ICD-10-CM | POA: Diagnosis present

## 2021-05-19 DIAGNOSIS — K219 Gastro-esophageal reflux disease without esophagitis: Secondary | ICD-10-CM | POA: Diagnosis present

## 2021-05-19 DIAGNOSIS — K5900 Constipation, unspecified: Secondary | ICD-10-CM | POA: Diagnosis present

## 2021-05-19 DIAGNOSIS — G929 Unspecified toxic encephalopathy: Secondary | ICD-10-CM | POA: Diagnosis present

## 2021-05-19 DIAGNOSIS — G5622 Lesion of ulnar nerve, left upper limb: Secondary | ICD-10-CM | POA: Diagnosis not present

## 2021-05-19 DIAGNOSIS — R1312 Dysphagia, oropharyngeal phase: Secondary | ICD-10-CM

## 2021-05-19 DIAGNOSIS — E890 Postprocedural hypothyroidism: Secondary | ICD-10-CM | POA: Diagnosis present

## 2021-05-19 DIAGNOSIS — H918X1 Other specified hearing loss, right ear: Secondary | ICD-10-CM | POA: Diagnosis present

## 2021-05-19 DIAGNOSIS — G4730 Sleep apnea, unspecified: Secondary | ICD-10-CM | POA: Diagnosis present

## 2021-05-19 DIAGNOSIS — Z885 Allergy status to narcotic agent status: Secondary | ICD-10-CM

## 2021-05-19 LAB — COMPREHENSIVE METABOLIC PANEL
ALT: 72 U/L — ABNORMAL HIGH (ref 0–44)
AST: 23 U/L (ref 15–41)
Albumin: 2.2 g/dL — ABNORMAL LOW (ref 3.5–5.0)
Alkaline Phosphatase: 52 U/L (ref 38–126)
Anion gap: 5 (ref 5–15)
BUN: 17 mg/dL (ref 8–23)
CO2: 28 mmol/L (ref 22–32)
Calcium: 8.6 mg/dL — ABNORMAL LOW (ref 8.9–10.3)
Chloride: 104 mmol/L (ref 98–111)
Creatinine, Ser: 0.65 mg/dL (ref 0.61–1.24)
GFR, Estimated: >60 mL/min (ref >60)
Glucose, Bld: 120 mg/dL — ABNORMAL HIGH (ref 70–99)
Potassium: 4.1 mmol/L (ref 3.5–5.1)
Sodium: 137 mmol/L (ref 135–145)
Total Bilirubin: 0.8 mg/dL (ref 0.3–1.2)
Total Protein: 6.5 g/dL (ref 6.5–8.1)

## 2021-05-19 LAB — CBC
HCT: 43.5 % (ref 39.0–52.0)
Hemoglobin: 14.3 g/dL (ref 13.0–17.0)
MCH: 31.4 pg (ref 26.0–34.0)
MCHC: 32.9 g/dL (ref 30.0–36.0)
MCV: 95.6 fL (ref 80.0–100.0)
Platelets: 297 10*3/uL (ref 150–400)
RBC: 4.55 MIL/uL (ref 4.22–5.81)
RDW: 13.6 % (ref 11.5–15.5)
WBC: 12.9 10*3/uL — ABNORMAL HIGH (ref 4.0–10.5)
nRBC: 0 % (ref 0.0–0.2)

## 2021-05-19 LAB — GLUCOSE, CAPILLARY
Glucose-Capillary: 115 mg/dL — ABNORMAL HIGH (ref 70–99)
Glucose-Capillary: 111 mg/dL — ABNORMAL HIGH (ref 70–99)
Glucose-Capillary: 133 mg/dL — ABNORMAL HIGH (ref 70–99)
Glucose-Capillary: 109 mg/dL — ABNORMAL HIGH (ref 70–99)
Glucose-Capillary: 107 mg/dL — ABNORMAL HIGH (ref 70–99)

## 2021-05-19 IMAGING — DX DG HIP (WITH OR WITHOUT PELVIS) 2-3V*L*
3 series · 3 of 3 positions shown · non-contrast
Comparison: None.

CLINICAL DATA: Left hip pain after a fall.  Initial encounter.

EXAM:
DG HIP (WITH OR WITHOUT PELVIS) 2-3V LEFT

[pelvis ap]
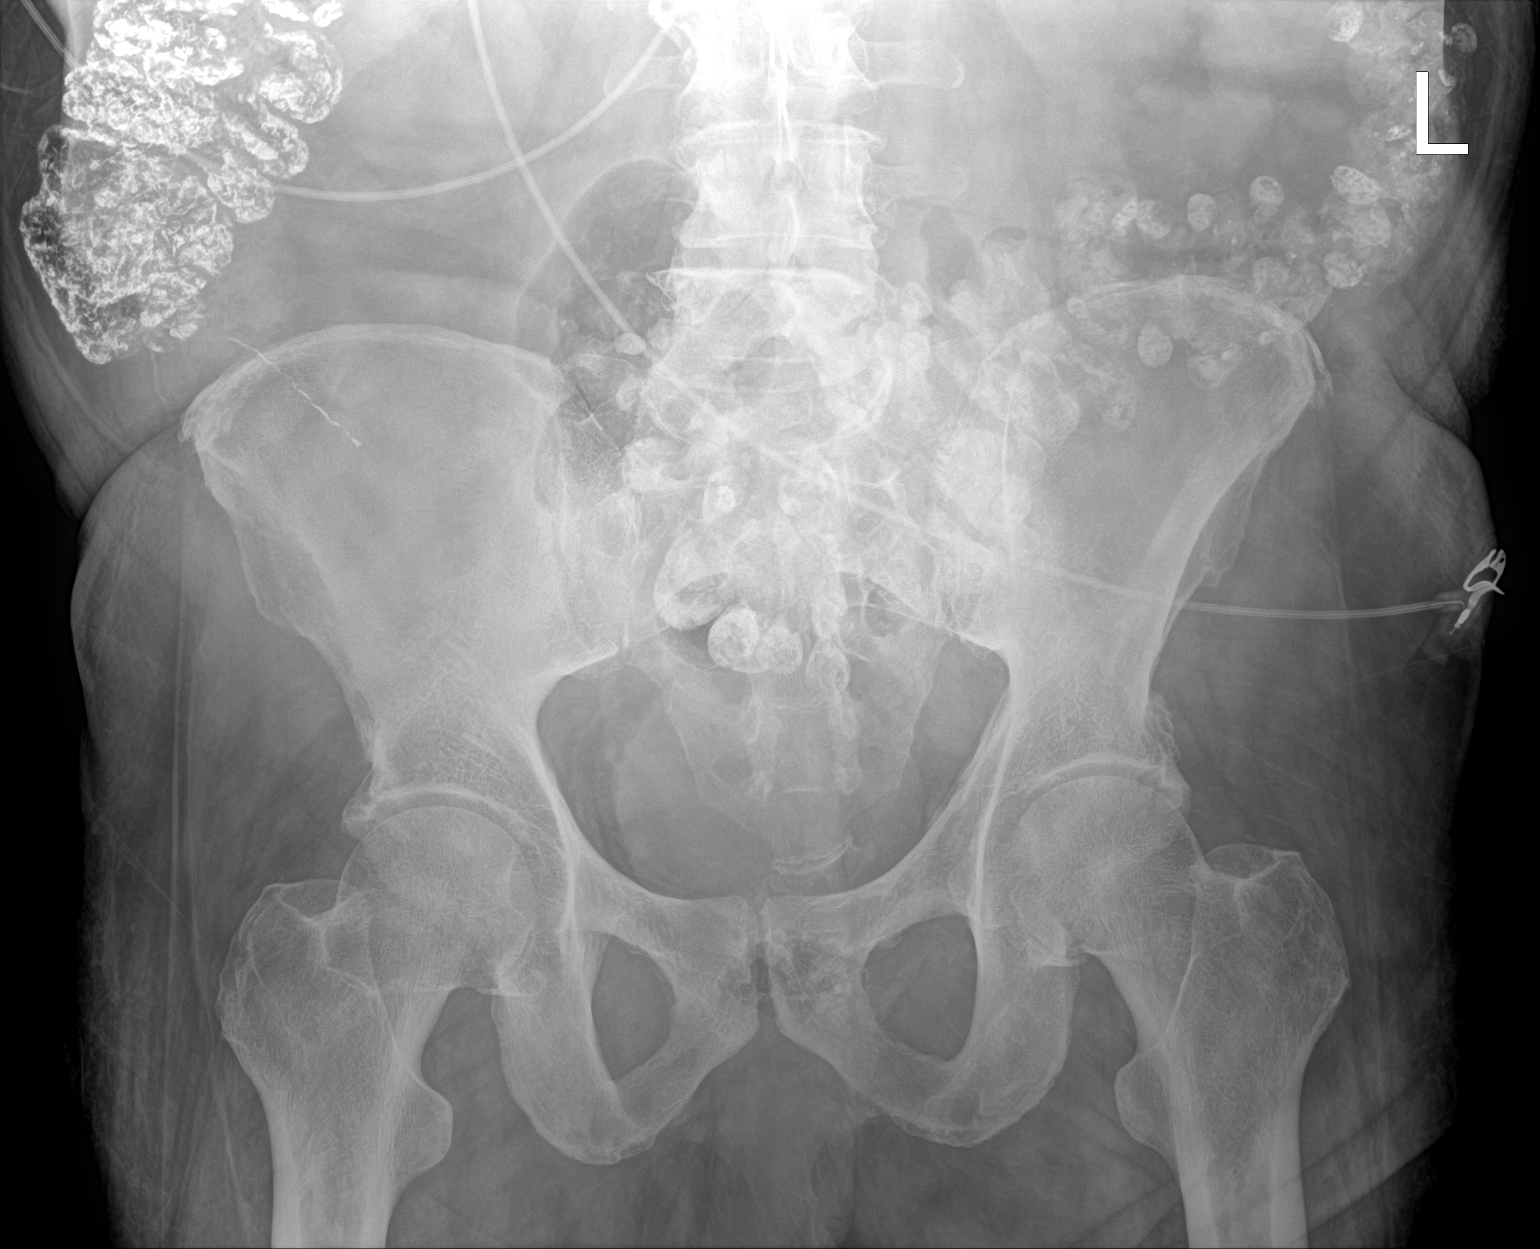

[hip ap]
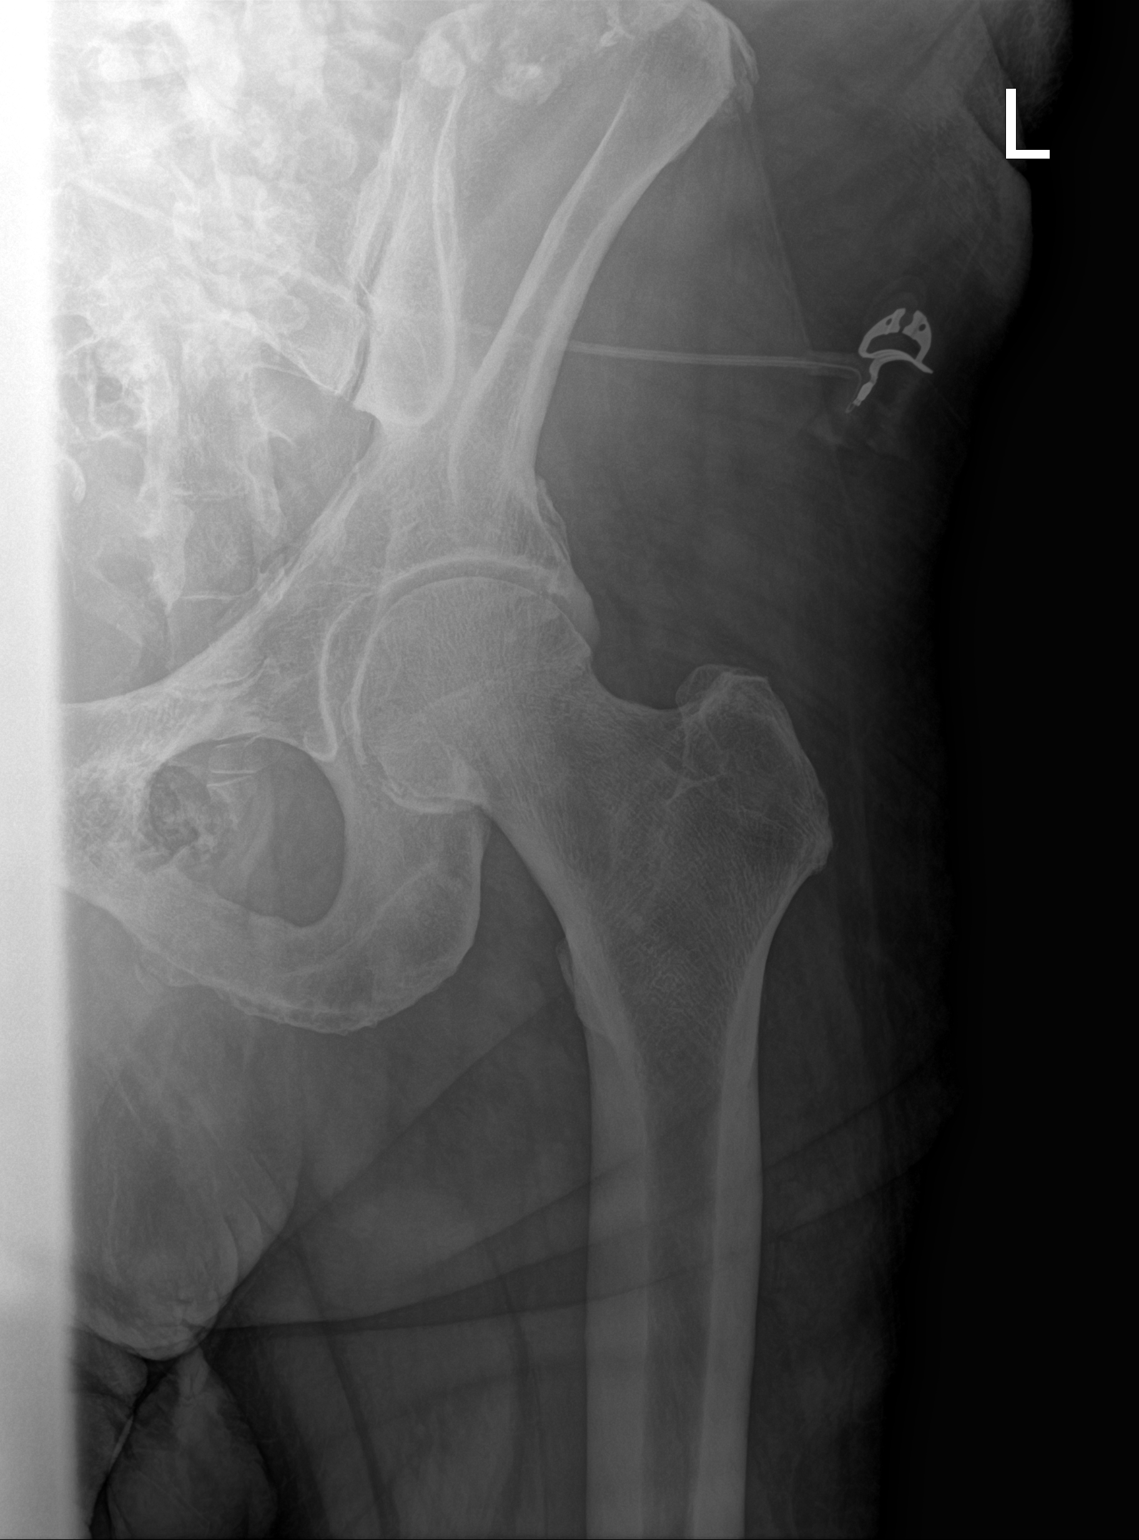

[hip lat]
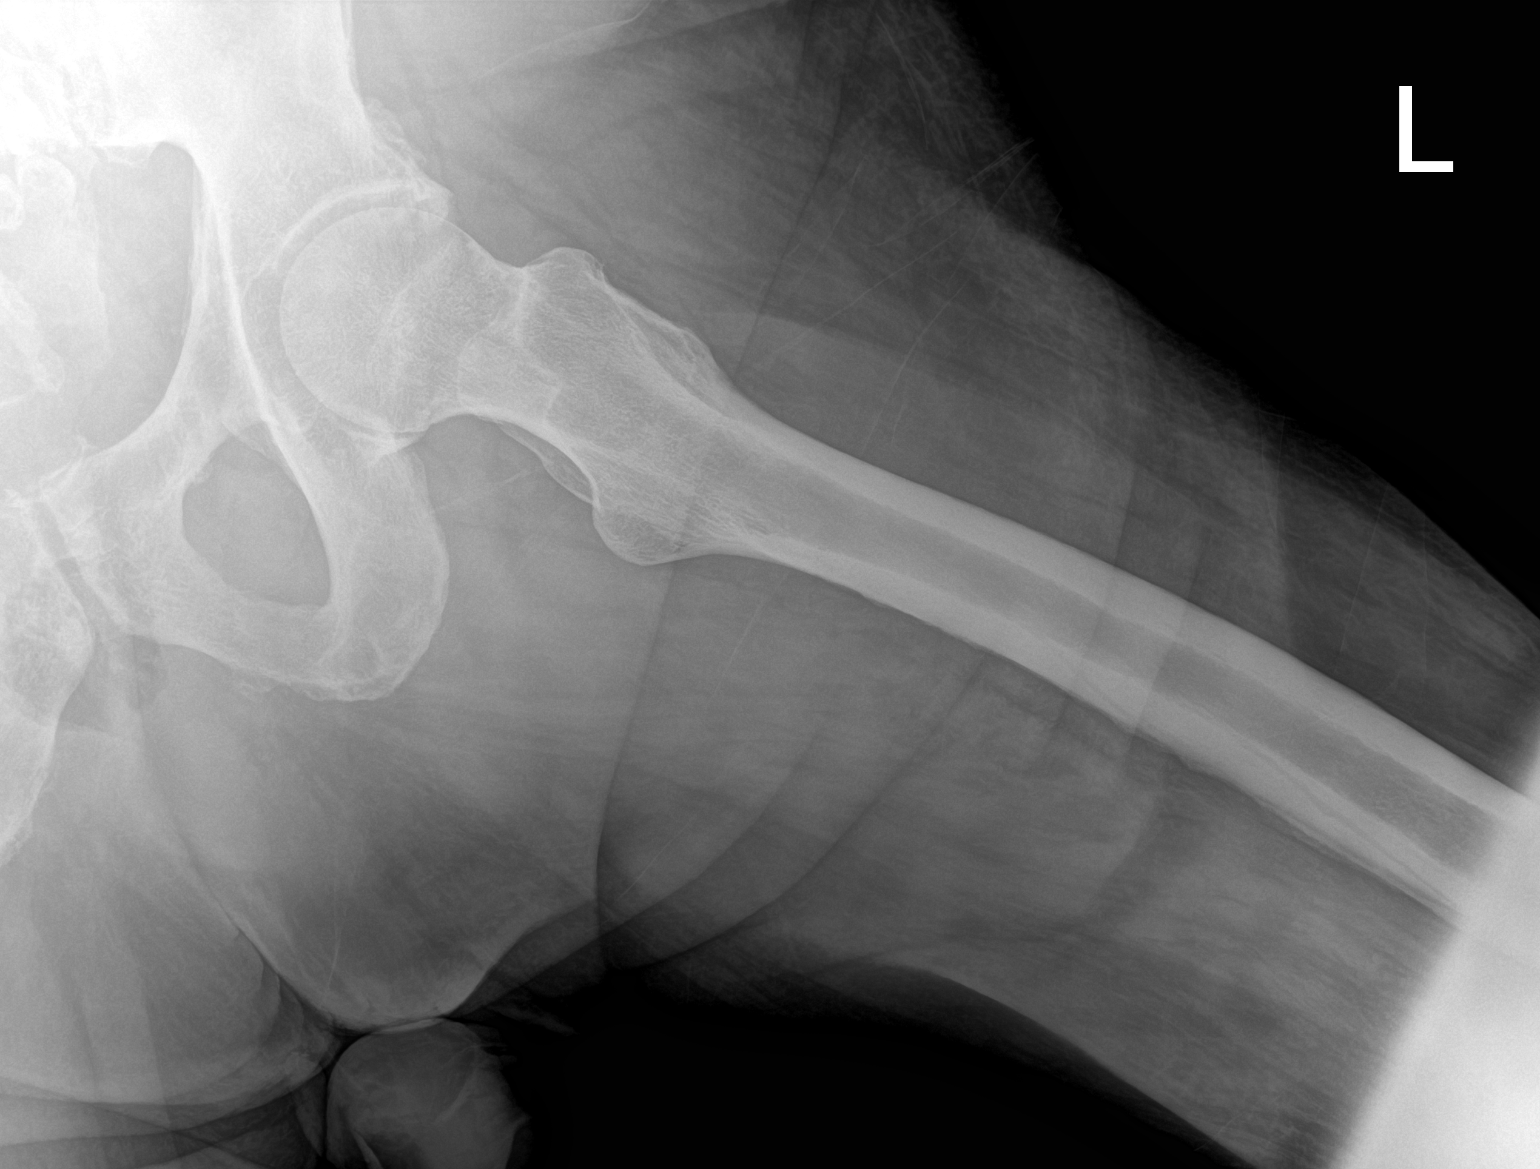

[3 of 3 positions shown; findings below may reference images not displayed]

FINDINGS: There is no evidence of hip fracture or dislocation. There is no
evidence of arthropathy or other focal bone abnormality. Contrast in
the colon from a barium swallow [DATE] noted.
IMPRESSION: Negative exam.

## 2021-05-19 MED ORDER — FLUCONAZOLE 100 MG PO TABS
100.0000 mg | ORAL_TABLET | Freq: Every day | ORAL | Status: AC
  Administered 2021-05-19 – 2021-05-23 (×5): 100 mg via ORAL
  Filled 2021-05-19 (×5): qty 1

## 2021-05-19 MED ORDER — ACETAMINOPHEN 325 MG PO TABS: 650.0000 mg | ORAL_TABLET | Freq: Four times a day (QID) | ORAL | Status: AC | PRN

## 2021-05-19 MED ORDER — BISACODYL 10 MG RE SUPP: 10.0000 mg | Freq: Every day | RECTAL | Status: DC | PRN

## 2021-05-19 MED ORDER — ADULT MULTIVITAMIN W/MINERALS CH
1.0000 | ORAL_TABLET | Freq: Every day | ORAL | Status: DC
  Administered 2021-05-20 – 2021-05-30 (×11): 1 via ORAL
  Filled 2021-05-19 (×11): qty 1

## 2021-05-19 MED ORDER — ADULT MULTIVITAMIN W/MINERALS CH
1.0000 | ORAL_TABLET | Freq: Every day | ORAL | Status: DC
  Administered 2021-05-19: 1 via ORAL
  Filled 2021-05-19: qty 1

## 2021-05-19 MED ORDER — HEPARIN SODIUM (PORCINE) 5000 UNIT/ML IJ SOLN
5000.0000 [IU] | Freq: Three times a day (TID) | INTRAMUSCULAR | Status: DC
  Administered 2021-05-19 – 2021-05-26 (×21): 5000 [IU] via SUBCUTANEOUS
  Filled 2021-05-19 (×21): qty 1

## 2021-05-19 MED ORDER — AMIODARONE HCL 200 MG PO TABS
200.0000 mg | ORAL_TABLET | Freq: Two times a day (BID) | ORAL | Status: DC
  Administered 2021-05-19 – 2021-05-20 (×3): 200 mg
  Filled 2021-05-19 (×4): qty 1

## 2021-05-19 MED ORDER — DICLOFENAC SODIUM 1 % EX GEL
2.0000 g | Freq: Three times a day (TID) | CUTANEOUS | Status: DC | PRN
  Administered 2021-05-20: 2 g via TOPICAL
  Filled 2021-05-19: qty 100

## 2021-05-19 MED ORDER — DOXAZOSIN MESYLATE 2 MG PO TABS
2.0000 mg | ORAL_TABLET | Freq: Every day | ORAL | Status: DC
  Administered 2021-05-20: 2 mg
  Filled 2021-05-19 (×2): qty 1

## 2021-05-19 MED ORDER — INSULIN ASPART 100 UNIT/ML IJ SOLN
0.0000 [IU] | Freq: Three times a day (TID) | INTRAMUSCULAR | Status: DC
  Administered 2021-05-20 – 2021-05-29 (×10): 3 [IU] via SUBCUTANEOUS

## 2021-05-19 MED ORDER — DOXAZOSIN MESYLATE 2 MG PO TABS: 2.0000 mg | ORAL_TABLET | Freq: Every day | ORAL | Status: DC

## 2021-05-19 MED ORDER — AMIODARONE HCL 200 MG PO TABS: 200.0000 mg | ORAL_TABLET | Freq: Two times a day (BID) | ORAL | Status: DC

## 2021-05-19 MED ORDER — ACETAMINOPHEN 325 MG PO TABS
650.0000 mg | ORAL_TABLET | Freq: Four times a day (QID) | ORAL | Status: DC | PRN
  Administered 2021-05-24 – 2021-05-28 (×5): 650 mg via ORAL
  Filled 2021-05-19 (×5): qty 2

## 2021-05-19 MED ORDER — HEPARIN SODIUM (PORCINE) 5000 UNIT/ML IJ SOLN: 5000.0000 [IU] | Freq: Three times a day (TID) | INTRAMUSCULAR | Status: DC

## 2021-05-19 MED ORDER — NYSTATIN 100000 UNIT/GM EX CREA
TOPICAL_CREAM | Freq: Two times a day (BID) | CUTANEOUS | Status: DC
  Administered 2021-05-20 – 2021-05-25 (×6): 1 via TOPICAL
  Filled 2021-05-19: qty 15

## 2021-05-19 NOTE — Discharge Summary (Signed)
Physician Discharge Summary  Hayden Mcbride:[id number] DOB: 11-Aug-1961 DOA: 04/29/2021  PCP: Robyne Peers, MD  Admit date: 04/29/2021 Discharge date: 05/19/2021  Admitted From: Home Disposition:  CIR  Recommendations for Outpatient Follow-up:  1. Follow up with PCP in 1-2 weeks    Discharge Condition:Improved CODE STATUS:Full Diet recommendation: Dysphagia 3 with honey thick liquids   Brief/Interim Summary: 60 year old male with hypertension, OSA, unspecified thyroid disease, comes into the hospital and is admitted on 5/17 due to sudden onset of headache, hearing loss and combativeness/confusion. He recently returned from a 2-week trip to [place]. He was intubated in the ED and was admitted to the ICU. Further work-up with an LP showed evidence of having pneumococcal meningitis. ID consulted. He was eventually extubated and transferred to the hospitalist service on 5/30.  Discharge Diagnoses:  Principal Problem:   Pneumococcal meningitis Active Problems:   Encounter for orogastric tube placement   AMS (altered mental status)   Meningitis   Acute respiratory failure with hypoxemia (HCC)   Status post peripherally inserted central catheter (PICC) central line placement   FUO (fever of unknown origin)   Staphylococcus aureus pneumonia (HCC)   Aspiration into respiratory tract   Atelectasis  Pneumococcal meningitis-he underwent an LP on 5/18 and cultures showed Streptococcus pneumoniae, pansensitive. Completed course of  high-dose penicillin,ID had been following earlier  Active Problems MRSA pneumonia-he was having fevers, cultured and tracheal aspirate cultures on 5/25 showed MRSA. He was initially on vancomycin but transition to Zyvox because of an elevation in creatinine. Respiratory status remains stable on room air -completed 7 day course of linezolid as of 6/4  Acute hypoxic respiratory failure, ventilator dependent respiratory failure-improving, weaned to  room air -Had required tube feeding -Now cleared for dysphagia 3 diet with coretrak removed per dietitian  Paroxysmal A. fib-apparently had an episode of A. fib and he was placed on amiodarone. Currently he is in sinus rhythm. He has not been anticoagulated due to bacterial meningitis and high risk of ICH. His A. fib is likely in the setting of acute illness, if he remains in sinus and does not have recurrence can probably avoid anticoagulation altogether. His CHA2DS2-VASc score is 1 for hypertension. 2D echo done on 5/27 showed an EF of 65-70%, normal LVEF without WMA, normal RV.  Acute toxic encephalopathy-due to #1, supportive care.  Seems now much improved, conversant and following commands  Thyroid disease-mentioned in the chart however TSH unremarkable.  Acute kidney injury-on 5/27 creatinine acutely increased to 1.6, possibly due to vancomycin.Cr improved  Hypernatremia-resolved   Leukocytosis-reviewed, improved  LFT elevation-overall stable for the past week, mild. Cont to follow LFT trends. RUQ [place] reviewed, s/p cholecystectomy, unremarkable  Hypertension-Home medications include amlodipine 5 mg, losartan 100 mg. Blood pressure is currently stable  Hypokalemia-would continue to monitor and replete as necessary  Acute urinary retention-had previously failed voiding trial on 5/27, he was placed on doxazosin -as pt is much more alert and oriented, successfully removed foley cath 6/5   Discharge Instructions   Allergies as of 05/19/2021      Reactions   Demerol [meperidine]    Large doses cause nausea and vomiting      Medication List    STOP taking these medications   amLODipine 5 MG tablet Commonly known as: NORVASC   ibuprofen 200 MG tablet Commonly known as: ADVIL   losartan 100 MG tablet Commonly known as: COZAAR     TAKE these medications   acetaminophen 325 MG tablet Commonly  known as: TYLENOL Take 2 tablets (650 mg total) by mouth every  6 (six) hours as needed for mild pain or headache.   amiodarone 200 MG tablet Commonly known as: PACERONE Place 1 tablet (200 mg total) into feeding tube 2 (two) times daily.   doxazosin 2 MG tablet Commonly known as: CARDURA Place 1 tablet (2 mg total) into feeding tube daily. Start taking on: May 20, 2021   omeprazole 20 MG capsule Commonly known as: PRILOSEC Take 20 mg by mouth daily.   One-A-Day Mens 50+ Tabs Take 1 tablet by mouth daily.       Follow-up Information    Robyne Peers, MD. Schedule an appointment as soon as possible for a visit in 1 week(s).   Specialty: Family Medicine Contact information: [address] [place] [address] [phone]              Allergies  Allergen Reactions  . Demerol [Meperidine]     Large doses cause nausea and vomiting    Consultations:  PCCM  ID  CIR  Procedures/Studies: DG Abd 1 View  Result Date: 05/11/2021 CLINICAL DATA:  60 year old male status post nasogastric tube placement. EXAM: ABDOMEN - 1 VIEW COMPARISON:  05/09/2021 FINDINGS: Nonspecific bowel gas pattern with mild gaseous distension of the small and large bowel, similar to comparison. Gastric decompression tube is kinked at its proximal side hole in the gastric fundus with the tip oriented toward the gastroesophageal junction. Cholecystectomy clips in the right upper quadrant. No acute osseous abnormality. IMPRESSION: Gastric decompression tube is kinked at its proximal side hole within the gastric fundus. The tip of the catheter is oriented toward the gastroesophageal junction. Continued advancement into the stomach may improve function. Electronically Signed   By: Ruthann Cancer MD   On: 05/11/2021 10:26   DG Abd 1 View  Result Date: 05/09/2021 CLINICAL DATA:  60 year old male with NG placement. EXAM: ABDOMEN - 1 VIEW COMPARISON:  Abdominal radiograph dated 05/02/2021. FINDINGS: Enteric tube with tip in the proximal stomach and side-port in  the region of the GE junction. Recommend further advancing of the tube by additional 10 cm. No bowel dilatation noted. Right upper quadrant cholecystectomy clips. IMPRESSION: Enteric tube with tip in the proximal stomach. Recommend further advancing of the tube by additional 10 cm. Electronically Signed   By: Anner Crete M.D.   On: 05/09/2021 18:44   DG Abd 1 View  Result Date: 05/02/2021 CLINICAL DATA:  Gastric catheter placement EXAM: ABDOMEN - 1 VIEW COMPARISON:  None. FINDINGS: Gastric catheter is noted within the stomach. Scattered large and small bowel gas is noted. No free air is noted. Degenerative change of the lumbar spine is seen. IMPRESSION: Gastric catheter within the stomach. Patient's known PICC line is not visualized on this image due to the image field of view. Electronically Signed   By: Inez Catalina M.D.   On: 05/02/2021 19:05   CT HEAD WO CONTRAST  Result Date: 05/05/2021 CLINICAL DATA:  Bacterial meningitis EXAM: CT HEAD WITHOUT CONTRAST TECHNIQUE: Contiguous axial images were obtained from the base of the skull through the vertex without intravenous contrast. COMPARISON:  Recent MR and CT imaging FINDINGS: There is significant streak artifact through the posterior fossa and skull base. Brain: There is no acute intracranial hemorrhage, mass effect, or edema. Gray-white differentiation is preserved. There is no extra-axial fluid collection. Ventricles and sulci are stable in size and configuration. Vascular: Increased density of some of the dural venous  sinuses. Skull: Calvarium is unremarkable. Sinuses/Orbits: No acute finding. Other: None. IMPRESSION: No acute intracranial hemorrhage, acute infarction, extra-axial collection, or hydrocephalus. There is increased density of some of the dural venous sinuses. This more likely reflects hemoconcentration than thrombus. MRV could be considered as indicated. Electronically Signed   By: Macy Mis M.D.   On: 05/05/2021 13:20   CT  CHEST WO CONTRAST  Result Date: 05/10/2021 CLINICAL DATA:  Fever of unknown origin. EXAM: CT CHEST WITHOUT CONTRAST TECHNIQUE: Multidetector CT imaging of the chest was performed following the standard protocol without IV contrast. COMPARISON:  Chest radiograph, 05/09/2021 and older studies. FINDINGS: Cardiovascular: Normal sized heart. No pericardial effusion. No coronary artery calcifications. Great vessels normal in caliber. No aortic atherosclerosis. Mediastinum/Nodes: Previous left thyroidectomy. Right thyroid lobe is enlarged and heterogeneous consistent with nodules. Either combined nodules or 1 large nodule spans 4.2 cm. No mediastinal or hilar masses or enlarged lymph nodes. Trachea and esophagus are unremarkable. Lungs/Pleura: There is consolidation in the posterior base of the right lower lobe consistent with pneumonia. There is minor atelectasis in the dependent left lower lobe. Focus of calcification in the right middle lobe. Mild scarring at the right apex. Remainder of the lungs is clear. No pleural effusion or pneumothorax. Upper Abdomen: No acute findings. 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 Left adrenal mass, 3.3 cm, average Hounsfield units of 54, nonspecific. Status post cholecystectomy. Musculoskeletal: No fracture or acute finding. No aggressive bone lesion. IMPRESSION: 1. Consolidation in the posterior base of the right lower lobe consistent with pneumonia. 2. No other acute abnormality. 3. Bilateral adrenal masses, the left measuring 3.3 cm, indeterminate, not meeting criteria for an adenoma on unenhanced imaging. Recommend follow-up adrenal MRI for further assessment. Right adrenal myelolipoma, benign. Electronically Signed   By: Lajean Manes M.D.   On: 05/10/2021 10:53   MR BRAIN W WO CONTRAST  Result Date: 05/06/2021 CLINICAL DATA:  Follow-up dural venous thrombosis. Bacterial meningitis. EXAM: MRI HEAD WITHOUT AND WITH CONTRAST TECHNIQUE: Multiplanar, multiecho pulse sequences of  the brain and surrounding structures were obtained without and with intravenous contrast. CONTRAST:  38mL GADAVIST GADOBUTROL 1 MMOL/ML IV SOLN COMPARISON:  Head CT yesterday.  MRI 04/30/2021 FINDINGS: Brain: There is a constellation of findings consistent with bacterial meningitis. Diffusion imaging does not show any restricted diffusion affecting the brain parenchyma to indicate infarction. Numerous septations/synechiae are noted within the subarachnoid spaces, and there scattered discrete foci of restricted diffusion within the subarachnoid spaces consistent with bacterial meningitis. These appear progressive compared to the study of 6 days ago. No evidence of ventriculitis of the lateral or third ventricles. Some restricted diffusion material present in the fourth ventricle. No evidence change in ventricular size. Small focus of hemosiderin deposition in the left temporoparietal junction region is unchanged, possibly pre-existing the current infection. After contrast administration, there is a remarkable absence of abnormal contrast enhancement of the leptomeninges. Vascular: Major vessels at the base of the brain show flow. Postcontrast imaging does not show evidence of venous thrombosis. Skull and upper cervical spine: No primary bone pathology. Sinuses/Orbits: Paranasal sinuses are clear. Fluid in left mastoid region and middle ear, similar to the previous exam. Other: None IMPRESSION: Findings consistent with the clinical diagnosis of bacterial meningitis. Since the previous study, the patient has developed more numerous foci of restricted diffusion scattered about the subarachnoid spaces, with areas of septation and synechiae formation consistent with ongoing bacterial meningitis. Small amount of restricted diffusion material present in the fourth ventricle, but no  evidence of widespread ventriculitis. No sign of venous thrombosis. No sign of brain parenchymal infarction. No change in ventricular size.  Remarkable absence of abnormal leptomeningeal contrast enhancement. Electronically Signed   By: Nelson Chimes M.D.   On: 05/06/2021 17:23   MR BRAIN W WO CONTRAST  Result Date: 04/30/2021 CLINICAL DATA:  60 year old male code stroke presentation. Altered mental status and headache, agitation. Encephalitis suspected clinically, with elevated CSF opening pressure and xanthochromia on lumbar puncture. EXAM: MRI HEAD WITHOUT AND WITH CONTRAST TECHNIQUE: Multiplanar, multiecho pulse sequences of the brain and surrounding structures were obtained without and with intravenous contrast. CONTRAST:  30mL GADAVIST GADOBUTROL 1 MMOL/ML IV SOLN COMPARISON:  CT head and CTA head and neck yesterday. FINDINGS: Brain: Susceptibility artifact along the left lateral face appears to only affect axial DWI and is of unclear etiology. There is diffusely abnormal FLAIR hyperintensity throughout the cerebral sulci (series 13, image 20) associated with diffuse smooth leptomeningeal enhancement following contrast (series 21, image 11). No pachymeningeal thickening identified. No discrete cerebral edema. No ventriculomegaly or intraventricular debris identified. No ependymal enhancement. There is a cluster of punctate abnormal subarachnoid space diffusion at the posterior midline near the parieto-occipital sulcus on series 5, image 83. No discrete abnormality here on the other sequences. No superimposed restricted diffusion suggestive of acute infarction. No midline shift, mass effect, evidence of mass lesion, ventriculomegaly, extra-axial collection or acute intracranial hemorrhage. Cervicomedullary junction and pituitary are within normal limits. Vascular: Major intracranial vascular flow voids are preserved. The major dural venous sinuses are enhancing and appear to be patent. Skull and upper cervical spine: Negative visible cervical spine and spinal cord. Visualized bone marrow signal is within normal limits. Sinuses/Orbits:  Disconjugate gaze. Otherwise negative orbits. Trace paranasal sinus mucosal thickening. Other: Intubated with fluid in the pharynx and nasal cavity. Mild left mastoid effusion. IMPRESSION: Diffuse leptomeningeal enhancement and abnormal FLAIR signal throughout the subarachnoid spaces compatible with an Acute Meningitis. Punctate DWI foci along the midline parieto-occipital sulcus might indicate trace purulence. But there is no drainable empyema and no focal parenchymal encephalitis. Also no evidence of acute infarct. Electronically Signed   By: Genevie Ann M.D.   On: 04/30/2021 05:12   MR Venogram Head  Result Date: 05/06/2021 CLINICAL DATA:  Bacterial meningitis.  Assess for venous thrombosis. EXAM: MR VENOGRAM HEAD WITHOUT AND WITH CONTRAST TECHNIQUE: Angiographic images of the intracranial venous structures were acquired using MRV technique without intravenous contrast. COMPARISON:  MRI brain same day.  Head CT yesterday. FINDINGS: No evidence intracranial venous thrombosis. Dominant right transverse sinus and jugular system, with a diminutive left transverse sinus, sigmoid sinus and jugular vein, but without evidence of thrombosis, particularly when correlated with the postcontrast images from the brain MRI same day. IMPRESSION: No intracranial venous thrombosis. The patient shows dominance of the right transverse sinus and jugular system. The left transverse sinus is diminutive, but does not show evidence of thrombosis, particularly given the postcontrast imaging. Electronically Signed   By: Nelson Chimes M.D.   On: 05/06/2021 17:27   DG CHEST PORT 1 VIEW  Result Date: 05/12/2021 CLINICAL DATA:  60 year old male with NG coming up. EXAM: PORTABLE CHEST 1 VIEW COMPARISON:  Chest radiograph dated 05/11/2021. FINDINGS: No enteric tube identified. Right-sided PICC with tip close to the cavoatrial junction. Faint bilateral densities, likely atelectasis. Atypical infection is not excluded. No focal consolidation,  pleural effusion, or pneumothorax. Stable cardiac silhouette. No acute osseous pathology. Postsurgical changes of left thyroidectomy. There is deviation of the  upper trachea to the left secondary to right thyroid nodule seen on the CT of 05/10/2021. IMPRESSION: 1. No enteric tube identified. 2. Faint bilateral pulmonary densities. Electronically Signed   By: Anner Crete M.D.   On: 05/12/2021 15:51   DG Chest Port 1 View  Result Date: 05/11/2021 CLINICAL DATA:  Atelectasis EXAM: PORTABLE CHEST 1 VIEW COMPARISON:  05/09/2021 FINDINGS: Lungs are clear.  No pleural effusion or pneumothorax. The heart is top-normal in size. Right arm PICC terminates in the mid SVC. Surgical clips along the left neck/mediastinum. IMPRESSION: No evidence of acute cardiopulmonary disease. Right arm PICC terminates in the mid SVC. Electronically Signed   By: Julian Hy M.D.   On: 05/11/2021 08:17   DG Chest Port 1 View  Result Date: 05/09/2021 CLINICAL DATA:  Acute respiratory failure, aspiration EXAM: PORTABLE CHEST 1 VIEW COMPARISON:  05/07/2021 FINDINGS: Single frontal view of the chest was obtained with the patient rotated toward the left. Endotracheal and enteric catheter seen previously have been removed. Cardiac silhouette is stable. Interval development of patchy right basilar consolidation which could reflect aspiration given clinical history. No effusion or pneumothorax. No acute bony abnormalities. IMPRESSION: 1. Interval development of patchy right basilar consolidation, which could reflect aspiration given clinical history. Electronically Signed   By: Randa Ngo M.D.   On: 05/09/2021 21:03   DG Chest Port 1 View  Result Date: 05/07/2021 CLINICAL DATA:  Acute respiratory failure.  Hypoxia EXAM: PORTABLE CHEST 1 VIEW COMPARISON:  Chest x-ray 05/06/2021 FINDINGS: Enteric tube with tip terminating 6.3 cm above the carina. Enteric tube coursing below the hemidiaphragm with tip overlying the gastric lumen  and side port not definitely identified. Right PICC not well visualized. The heart size and mediastinal contours are within normal limits. Right lower lung zone opacity. Left base nodular density. No pulmonary edema. No pleural effusion. No pneumothorax. No acute osseous abnormality. IMPRESSION: 1. Bilateral lower lobe nodular-like opacities. 2. Enteric tube with tip terminating 6.3 cm above the carina. 3. Enteric tube coursing below the hemidiaphragm with tip overlying the gastric lumen and side port not definitely identified. 4. Right PICC not well visualized. Electronically Signed   By: Iven Finn M.D.   On: 05/07/2021 04:31   DG Chest Port 1 View  Result Date: 05/06/2021 CLINICAL DATA:  Respiratory failure.  Hypoxia. EXAM: PORTABLE CHEST 1 VIEW COMPARISON:  05/04/2021. FINDINGS: Endotracheal tube, right PICC line in stable position. NG tube tip below left hemidiaphragm. NG tube tip not imaged. Heart size normal. Low lung volumes with mild right base subsegmental atelectasis. No pleural effusion or pneumothorax. IMPRESSION: 1. Endotracheal tube right PICC line stable position. NG tube tip below left hemidiaphragm. NG tube tip not imaged. 2.  Low lung volumes with mild right base subsegmental atelectasis. Electronically Signed   By: Marcello Moores  Register   On: 05/06/2021 06:23   DG Chest Port 1 View  Result Date: 05/04/2021 CLINICAL DATA:  Acute respiratory failure EXAM: PORTABLE CHEST 1 VIEW COMPARISON:  May 02, 2021 FINDINGS: An NG tube terminates in the region of the proximal duodenum. A right PICC line terminates near the caval atrial junction. An ETT is in good position. No pneumothorax. Stable cardiomegaly. The hila and mediastinum are unchanged. Mild atelectasis in the left base. No overt edema or focal infiltrate. No nodule or mass. IMPRESSION: 1. The NG tube terminates in the right side of the abdomen, probably in the proximal right duodenum. Consider withdrawing 5 or 6 cm. 2. Other support  apparatus  as above. 3. No other acute abnormalities. Electronically Signed   By: Dorise Bullion III M.D   On: 05/04/2021 07:08   DG CHEST PORT 1 VIEW  Result Date: 05/02/2021 CLINICAL DATA:  PICC placement. EXAM: PORTABLE CHEST 1 VIEW COMPARISON:  Same day. FINDINGS: Stable cardiomediastinal silhouette. Endotracheal nasogastric tubes are unchanged in position. Bibasilar atelectasis is noted with probable small left pleural effusion. No pneumothorax is noted. Right-sided PICC line is noted with tip in expected position of the SVC. Bony thorax is unremarkable. IMPRESSION: Right-sided PICC line with distal tip in expected position of the SVC. Stable support apparatus. Stable bibasilar atelectasis is noted with probable small left pleural effusion. Electronically Signed   By: Marijo Conception M.D.   On: 05/02/2021 20:34   DG CHEST PORT 1 VIEW  Result Date: 05/02/2021 CLINICAL DATA:  Central line placement. EXAM: PORTABLE CHEST 1 VIEW COMPARISON:  Apr 29, 2021. FINDINGS: Stable cardiomediastinal silhouette. Endotracheal and nasogastric tubes are unchanged in position. No pneumothorax is noted. Right-sided PICC line is noted, although its distal tip cannot be visualized due to overlying cardiac shadow. Bibasilar atelectasis is noted. Small left pleural effusion may be present. Bony thorax is unremarkable. IMPRESSION: Interval placement of right-sided PICC line, although distal tip cannot be visualized due to overlying cardiac shadow. Stable support apparatus. Bibasilar subsegmental atelectasis. Electronically Signed   By: Marijo Conception M.D.   On: 05/02/2021 17:12   DG Chest Portable 1 View  Result Date: 04/29/2021 CLINICAL DATA:  Status post intubation EXAM: PORTABLE CHEST 1 VIEW COMPARISON:  None. FINDINGS: Endotracheal tube is noted approximately 3.5 cm above the carina. Gastric catheter extends into the stomach although the proximal side port lies at the gastroesophageal junction. This could be advanced  further into the stomach. Cardiac shadow is enlarged accentuated by the portable technique. Mild vascular congestion is noted. IMPRESSION: Tubes and lines as described above. Gastric catheter should be advanced deeper into the stomach. Mild vascular congestion. Electronically Signed   By: Inez Catalina M.D.   On: 04/29/2021 23:45   DG Abd Portable 1V  Result Date: 05/13/2021 CLINICAL DATA:  Feeding tube placement. EXAM: PORTABLE ABDOMEN - 1 VIEW COMPARISON:  Chest radiograph 05/12/2021 FINDINGS: A feeding tube has been placed and terminates in the expected region of the mid gastric body. A right PICC remains in place. Lung volumes are mildly low without evidence of airspace consolidation, edema, a sizable pleural effusion, or pneumothorax. Only the uppermost portion of the abdomen was included on this study. IMPRESSION: Feeding tube placement as above. Electronically Signed   By: Logan Bores M.D.   On: 05/13/2021 11:15   DG Abd Portable 1V  Result Date: 04/30/2021 CLINICAL DATA:  Check gastric catheter placement EXAM: PORTABLE ABDOMEN - 1 VIEW COMPARISON:  None FINDINGS: Gastric catheter is noted in the distal stomach in satisfactory position. Scattered mildly dilated loops of small bowel are noted. IMPRESSION: Gastric catheter as described. Findings consistent with mild small bowel dilatation. Electronically Signed   By: Inez Catalina M.D.   On: 04/30/2021 18:13   EEG adult  Result Date: 04/30/2021 Lora Havens, MD     04/30/2021 11:12 AM Patient Name: Hayden Mcbride MRN: [id number] Epilepsy Attending: Lora Havens Referring Physician/Provider: Dr Amie [place] Date: 04/30/2021 Duration: 24.13 mins Patient history: 60 year old with sudden onset of altered mental status with a preceding headache. EEG to evaluate for seizure. Level of alertness: comatose AEDs during EEG study: None Technical aspects: This EEG study  was done with scalp electrodes positioned according to the 10-20 International system  of electrode placement. Electrical activity was acquired at a sampling rate of 500Hz  and reviewed with a high frequency filter of 70Hz  and a low frequency filter of 1Hz . EEG data were recorded continuously and digitally stored. Description: EEG showed continuous generalized and lateralized right hemisphere 3 to 6 Hz theta-delta slowing. Hyperventilation and photic stimulation were not performed.   ABNORMALITY - Continuous slow, generalized and lateralized right hemisphere IMPRESSION: This study is suggestive of cortical dysfunction arising from right hemisphere region likely secondary to underlying structural abnormalitywithin normal limits. Additionally, there is moderate diffuse encephalopathy, non specific etiology. No seizures or epileptiform discharges were seen throughout the recording. Hayden Mcbride   Overnight EEG with video  Result Date: 05/08/2021 Lora Havens, MD     05/08/2021  4:39 PM Patient Name: Hayden Mcbride MRN: [id number] Epilepsy Attending: Lora Havens Referring Physician/Provider: Dr Kathrynn Speed Duration:  05/07/2021 1532 to 05/09/2019 1009  Patient history: 60 year old with strep pneumo meningitis. EEG to evaluate for seizure.  Level of alertness: awake, asleep  AEDs during EEG study: None  Technical aspects: This EEG study was done with scalp electrodes positioned according to the 10-20 International system of electrode placement. Electrical activity was acquired at a sampling rate of 500Hz  and reviewed with a high frequency filter of 70Hz  and a low frequency filter of 1Hz . EEG data were recorded continuously and digitally stored.  Description: No clear posterior dominant rhythm was seen. EEG showed continuous generalized polymorphic mixed frequencies with predominantly 5 to 9 Hz theta and alpha activity as well as intermittent generalized 2 to 3 Hz delta slowing, at times with triphasic morphology.  Hyperventilation and photic stimulation were not performed.     ABNORMALITY - Continuous slow, generalized  IMPRESSION: This study is suggestive of moderate diffuse encephalopathy, non specific etiology. No seizures or definite epileptiform discharges were seen throughout the recording.  Lora Havens   ECHOCARDIOGRAM COMPLETE  Result Date: 05/09/2021    ECHOCARDIOGRAM REPORT   Patient Name:   Hayden Mcbride Date of Exam: 05/09/2021 Medical Rec #:  [id number]       Height:       71.0 in Accession #:    [id number]      Weight:       247.1 lb Date of Birth:  [birth year]        BSA:          2.307 m Patient Age:    42 years        BP:           141/53 mmHg Patient Gender: M               HR:           82 bpm. Exam Location:  Inpatient Procedure: 2D Echo, Cardiac Doppler and Color Doppler Indications:    Fever R50.9  History:        Patient has prior history of Echocardiogram examinations, most                 recent 03/19/2015. Arrythmias:Atrial Fibrillation; Risk                 Factors:Hypertension and Sleep Apnea. GERD, thyroid disease.  Sonographer:    Darlina Sicilian RDCS Referring Phys: [place]  1. Left ventricular ejection fraction, by estimation, is 65 to 70%. The left ventricle has normal function. The  left ventricle has no regional wall motion abnormalities. Left ventricular diastolic function could not be evaluated.  2. Right ventricular systolic function is normal. The right ventricular size is normal. Tricuspid regurgitation signal is inadequate for assessing PA pressure.  3. The mitral valve is grossly normal. No evidence of mitral valve regurgitation. No evidence of mitral stenosis.  4. The aortic valve is tricuspid. Aortic valve regurgitation is not visualized. No aortic stenosis is present.  5. The inferior vena cava is normal in size with greater than 50% respiratory variability, suggesting right atrial pressure of 3 mmHg. Conclusion(s)/Recommendation(s): No evidence of valvular vegetations on this transthoracic echocardiogram.  Would recommend a transesophageal echocardiogram to exclude infective endocarditis if clinically indicated. FINDINGS  Left Ventricle: Left ventricular ejection fraction, by estimation, is 65 to 70%. The left ventricle has normal function. The left ventricle has no regional wall motion abnormalities. The left ventricular internal cavity size was normal in size. There is  no left ventricular hypertrophy. Left ventricular diastolic function could not be evaluated due to atrial fibrillation. Left ventricular diastolic function could not be evaluated. Right Ventricle: The right ventricular size is normal. No increase in right ventricular wall thickness. Right ventricular systolic function is normal. Tricuspid regurgitation signal is inadequate for assessing PA pressure. Left Atrium: Left atrial size was normal in size. Right Atrium: Right atrial size was normal in size. Pericardium: Trivial pericardial effusion is present. Presence of pericardial fat pad. Mitral Valve: The mitral valve is grossly normal. No evidence of mitral valve regurgitation. No evidence of mitral valve stenosis. Tricuspid Valve: The tricuspid valve is grossly normal. Tricuspid valve regurgitation is not demonstrated. No evidence of tricuspid stenosis. Aortic Valve: The aortic valve is tricuspid. Aortic valve regurgitation is not visualized. No aortic stenosis is present. Pulmonic Valve: The pulmonic valve was grossly normal. Pulmonic valve regurgitation is not visualized. No evidence of pulmonic stenosis. Aorta: The aortic root and ascending aorta are structurally normal, with no evidence of dilitation. Venous: The inferior vena cava is normal in size with greater than 50% respiratory variability, suggesting right atrial pressure of 3 mmHg. IAS/Shunts: The atrial septum is grossly normal.  LEFT VENTRICLE PLAX 2D LVIDd:         5.20 cm LVIDs:         3.50 cm LV PW:         1.10 cm LV IVS:        1.15 cm LVOT diam:     2.20 cm LVOT Area:     3.80 cm   RIGHT VENTRICLE RV S prime:     11.30 cm/s TAPSE (M-mode): 1.0 cm LEFT ATRIUM           Index LA diam:      3.80 cm 1.65 cm/m LA Vol (A4C): 35.3 ml 15.30 ml/m   AORTA Ao Root diam: 3.80 cm Ao Asc diam:  3.20 cm MITRAL VALVE MV Area (PHT): 2.58 cm    SHUNTS MV Decel Time: 294 msec    Systemic Diam: 2.20 cm MV E velocity: 57.00 cm/s Eleonore Chiquito MD Electronically signed by Eleonore Chiquito MD Signature Date/Time: 05/09/2021/4:55:50 PM    Final    DG HIP UNILAT WITH PELVIS 2-3 VIEWS LEFT  Result Date: 05/19/2021 CLINICAL DATA:  Left hip pain after a fall.  Initial encounter. EXAM: DG HIP (WITH OR WITHOUT PELVIS) 2-3V LEFT COMPARISON:  None. FINDINGS: There is no evidence of hip fracture or dislocation. There is no evidence of arthropathy or other focal bone abnormality.  Contrast in the colon from a barium swallow 05/16/2021 noted. IMPRESSION: Negative exam. Electronically Signed   By: Inge Rise M.D.   On: 05/19/2021 11:58   CT HEAD CODE STROKE WO CONTRAST  Result Date: 04/29/2021 CLINICAL DATA:  Code stroke.  Stroke, follow up EXAM: CT HEAD WITHOUT CONTRAST TECHNIQUE: Contiguous axial images were obtained from the base of the skull through the vertex without intravenous contrast. COMPARISON:  None. FINDINGS: Brain: There is no mass, hemorrhage or extra-axial collection. The size and configuration of the ventricles and extra-axial CSF spaces are normal. The brain parenchyma is normal, without evidence of acute or chronic infarction. Vascular: No abnormal hyperdensity of the major intracranial arteries or dural venous sinuses. No intracranial atherosclerosis. Skull: The visualized skull base, calvarium and extracranial soft tissues are normal. Sinuses/Orbits: No fluid levels or advanced mucosal thickening of the visualized paranasal sinuses. Left mastoid effusion. The orbits are normal. ASPECTS [facility] Stroke Program Early CT Score) - Ganglionic level infarction (caudate, lentiform nuclei, internal  capsule, insula, M1-M3 cortex): 7 - Supraganglionic infarction (M4-M6 cortex): 3 Total score (0-10 with 10 being normal): 10 IMPRESSION: 1. No acute intracranial abnormality. 2. ASPECTS is 10. These results were communicated to Dr. Lesleigh Noe at 11:09 pm on 04/29/2021 by text page via the [facility] messaging system. Electronically Signed   By: Ulyses Jarred M.D.   On: 04/29/2021 23:09   [place] EKG SITE RITE  Result Date: 05/02/2021 If Site Rite image not attached, placement could not be confirmed due to current cardiac rhythm.  CT ANGIO HEAD NECK W WO CM W PERF (CODE STROKE)  Result Date: 04/29/2021 CLINICAL DATA:  Acute neurologic deficit EXAM: CT ANGIOGRAPHY HEAD AND NECK TECHNIQUE: Multidetector CT imaging of the head and neck was performed using the standard protocol during bolus administration of intravenous contrast. Multiplanar CT image reconstructions and MIPs were obtained to evaluate the vascular anatomy. Carotid stenosis measurements (when applicable) are obtained utilizing NASCET criteria, using the distal internal carotid diameter as the denominator. CONTRAST:  140mL OMNIPAQUE IOHEXOL 350 MG/ML SOLN COMPARISON:  None. FINDINGS: CTA NECK FINDINGS SKELETON: There is no bony spinal canal stenosis. No lytic or blastic lesion. OTHER NECK: Normal pharynx, larynx and major salivary glands. No cervical lymphadenopathy. 4.7 cm right thyroid nodule. UPPER CHEST: No pneumothorax or pleural effusion. No nodules or masses. AORTIC ARCH: There is no calcific atherosclerosis of the aortic arch. There is no aneurysm, dissection or hemodynamically significant stenosis of the visualized portion of the aorta. Conventional 3 vessel aortic branching pattern. The visualized proximal subclavian arteries are widely patent. RIGHT CAROTID SYSTEM: Normal without aneurysm, dissection or stenosis. LEFT CAROTID SYSTEM: Normal without aneurysm, dissection or stenosis. VERTEBRAL ARTERIES: Left dominant configuration. Both origins  are clearly patent. There is no dissection, occlusion or flow-limiting stenosis to the skull base (V1-V3 segments). CTA HEAD FINDINGS POSTERIOR CIRCULATION: --Vertebral arteries: Normal V4 segments. --Inferior cerebellar arteries: Normal. --Basilar artery: Normal. --Superior cerebellar arteries: Normal. --Posterior cerebral arteries (PCA): Normal. ANTERIOR CIRCULATION: --Intracranial internal carotid arteries: Normal. --Anterior cerebral arteries (ACA): Normal. Both A1 segments are present. Patent anterior communicating artery (a-comm). --Middle cerebral arteries (MCA): Normal. VENOUS SINUSES: As permitted by contrast timing, patent. ANATOMIC VARIANTS: Fetal origin of the left posterior cerebral artery. Review of the MIP images confirms the above findings. IMPRESSION: 1. No emergent large vessel occlusion or high-grade stenosis of the intracranial arteries. 2. 4.7 cm right thyroid nodule. Recommend thyroid US (ref: J Am Coll Radiol. 2015 Feb;12(2): 143-50). Electronically Signed   By:  Ulyses Jarred M.D.   On: 04/29/2021 23:21   US Abdomen Limited RUQ (LIVER/GB)  Result Date: 05/14/2021 CLINICAL DATA:  60 year old male with elevated LFTs. EXAM: ULTRASOUND ABDOMEN LIMITED RIGHT UPPER QUADRANT COMPARISON:  None. FINDINGS: Gallbladder: Cholecystectomy. Common bile duct: Diameter: 5 mm Liver: No focal lesion identified. Within normal limits in parenchymal echogenicity. Portal vein is patent on color Doppler imaging with normal direction of blood flow towards the liver. Other: None. IMPRESSION: Cholecystectomy, otherwise unremarkable right upper quadrant ultrasound. Electronically Signed   By: Anner Crete M.D.   On: 05/14/2021 23:49     Subjective: Eager to start therapy  Discharge Exam: Vitals:   05/19/21 0515 05/19/21 0707  BP: (!) 167/74 132/84  Pulse: 75 96  Resp: 20 15  Temp: 98 F (36.7 C) 98.5 F (36.9 C)  SpO2: 95% 100%   Vitals:   05/18/21 1947 05/18/21 2015 05/19/21 0515 05/19/21 0707   BP:  (!) 159/72 (!) 167/74 132/84  Pulse:  77 75 96  Resp: 19 20 20 15   Temp:  98.6 F (37 C) 98 F (36.7 C) 98.5 F (36.9 C)  TempSrc:  Oral Oral Oral  SpO2:  99% 95% 100%  Weight:      Height:        General: Pt is alert, awake, not in acute distress Cardiovascular: RRR, S1/S2 + Respiratory: CTA bilaterally, no wheezing, no rhonchi Abdominal: Soft, NT, ND, bowel sounds + Extremities: no edema, no cyanosis   The results of significant diagnostics from this hospitalization (including imaging, microbiology, ancillary and laboratory) are listed below for reference.     Microbiology: No results found for this or any previous visit (from the past 240 hour(s)).   Labs: BNP (last 3 results) No results for input(s): BNP in the last 8760 hours. Basic Metabolic Panel: Recent Labs  Lab 05/13/21 0159 05/14/21 0356 05/15/21 0235 05/16/21 0122 05/17/21 0226 05/18/21 0221 05/19/21 0423  NA 138   < > 133* 137 137 137 137  K 4.0   < > 3.8 3.8 4.0 4.1 4.1  CL 105   < > 102 108 107 105 104  CO2 26   < > 25 25 24 26 28   GLUCOSE 105*   < > 115* 124* 114* 132* 120*  BUN 17   < > 12 13 15 15 17   CREATININE 0.84   < > 0.65 0.67 0.61 0.68 0.65  CALCIUM 8.0*   < > 8.1* 8.1* 8.4* 8.4* 8.6*  MG 2.0  --   --   --   --   --   --    < > = values in this interval not displayed.   Liver Function Tests: Recent Labs  Lab 05/15/21 0235 05/16/21 0122 05/17/21 0226 05/18/21 0221 05/19/21 0423  AST 55* 43* 32 26 23  ALT 134* 117* 101* 82* 72*  ALKPHOS 46 49 52 48 52  BILITOT 0.8 0.5 0.3 0.7 0.8  PROT 5.9* 5.6* 6.1* 5.9* 6.5  ALBUMIN 1.9* 1.8* 2.0* 2.0* 2.2*   No results for input(s): LIPASE, AMYLASE in the last 168 hours. No results for input(s): AMMONIA in the last 168 hours. CBC: Recent Labs  Lab 05/15/21 0235 05/16/21 0122 05/17/21 0226 05/18/21 0221 05/19/21 0423  WBC 12.6* 12.6* 13.0* 13.9* 12.9*  HGB 13.2 12.8* 13.3 13.5 14.3  HCT 39.5 38.1* 39.1 40.7 43.5  MCV 94.0  95.3 94.0 95.1 95.6  PLT 240 260 258 279 297   Cardiac Enzymes: No results  for input(s): CKTOTAL, CKMB, CKMBINDEX, TROPONINI in the last 168 hours. BNP: Invalid input(s): POCBNP CBG: Recent Labs  Lab 05/18/21 2018 05/18/21 2313 05/19/21 0303 05/19/21 0704 05/19/21 1153  GLUCAP 112* 117* 115* 111* 133*   D-Dimer No results for input(s): DDIMER in the last 72 hours. Hgb A1c No results for input(s): HGBA1C in the last 72 hours. Lipid Profile No results for input(s): CHOL, HDL, LDLCALC, TRIG, CHOLHDL, LDLDIRECT in the last 72 hours. Thyroid function studies No results for input(s): TSH, T4TOTAL, T3FREE, THYROIDAB in the last 72 hours.  Invalid input(s): FREET3 Anemia work up No results for input(s): VITAMINB12, FOLATE, FERRITIN, TIBC, IRON, RETICCTPCT in the last 72 hours. Urinalysis    Component Value Date/Time   COLORURINE YELLOW 04/29/2021 2342   APPEARANCEUR CLEAR 04/29/2021 2342   LABSPEC 1.033 (H) 04/29/2021 2342   PHURINE 6.0 04/29/2021 2342   GLUCOSEU 50 (A) 04/29/2021 2342   HGBUR SMALL (A) 04/29/2021 2342   BILIRUBINUR NEGATIVE 04/29/2021 2342   KETONESUR 5 (A) 04/29/2021 2342   PROTEINUR NEGATIVE 04/29/2021 2342   NITRITE NEGATIVE 04/29/2021 2342   LEUKOCYTESUR NEGATIVE 04/29/2021 2342   Sepsis Labs Invalid input(s): PROCALCITONIN,  WBC,  LACTICIDVEN Microbiology No results found for this or any previous visit (from the past 240 hour(s)).  Time spent: 30 min  SIGNED:   Marylu Lund, MD  [facility] 05/19/2021, 2:01 PM  If 7PM-7AM, please contact night-coverage

## 2021-05-19 NOTE — Progress Notes (Signed)
[] Hover for details       Physical Medicine and Rehabilitation Consult   Reason for Consult: Functional deficits due to meningitis Referring Physician: Dr. Antony Haste   HPI: Hayden Mcbride is a 60 y.o. RH-male with history of HTN, OSA, glaucoma, right hearing loss, reports of recent visit to [place] who was admitted on 04/29/2020 with mental status changes nonsensical speech, lethargy, headaches, right gaze preference and became progressively agitated in ED requiring intubation for airway protection.  MRI brain done showing diffuse leptomeningeal enhancement and abnormal FLAIR signal throughout subarachnoid spaces compatible with acute meningitis as well as punctate DWI foci along midline parieto-occipital sulci with question of trace purulence.  CTA head/neck was negative for LVO or high-grade stenosis and showed incidental 4.7 cm right thyroid nodule. LP done showing marked xanthochromia with elevated opening pressure consistent with encephalitis and he was started on acyclovir as well as broad-spectrum antibiotics.  CSF revealed neutrophilic pleocytosis with WBC 585, protein>600, glucose<20  and gram stain showing few Streptococcus pneumonia.  Blood cultures x2 negative.  EEG without evidence of seizures.  Antibiotics narrowed to high-dose penicillin per ID input.  He continued to have lethargy with decrease in movement, disconjugate gaze as well as decrease in ability to follow commands.  He was noted to have decreasing movement on LLE.  Neurology recommended repeat CT head 5/23 which showed increased density in some of dural venous sinuses question hemoconcentration versus thrombus.  MRI/MRV brain repeated showing changes consistent with bacterial meningitis and increase in numerous foci of restricted diffusion about subarachnoid spaces with areas of septation and synechiae formation consistent with bacterial meningitis, no evidence of bacterial ventriculitis or venous thrombosis.  He  developed fevers with leukocytosis and sputum cultures positive for MRSA.  He was started on linezolid and tolerated extubation to BiPAP by 05/27.  CT chest on 05/28 showing consolidation at the posterior base right lower lobe consistent with pneumonia as well as incidental bilateral adrenal masses with recommendations of adrenal MRI for further assessment. 2D echo done showing EF 65 to 70% with no valve abnormality.  He did develop A. fib/a flutter felt to be due to stress of underlying illness and was started on amiodarone for rate control.  On core track for tube feeds and electrolyte abnormalities treated with D5W as well as potassium/magnesium replacement.  Therapy evaluations completed this weekend revealing pharyngeal dysphagia with thick secretions and difficulty initiating swallow, cognitive deficits with delay in processing and generalized weakness BUE>BLE with bilateral knee instability affecting mobility and ADLs.    CIR was recommended due to functional decline   Review of Systems  Constitutional: Negative for chills, fever and weight loss.  HENT: Negative for congestion and nosebleeds.   Eyes: Negative for discharge and redness.  Respiratory: Negative for cough, shortness of breath and stridor.   Cardiovascular: Negative for chest pain and leg swelling.  Gastrointestinal: Negative for abdominal pain, nausea and vomiting.  Genitourinary: Positive for frequency.  Musculoskeletal: Negative.   Skin: Negative for itching.  Neurological: Positive for weakness. Negative for headaches.  Psychiatric/Behavioral: Positive for memory loss. Negative for hallucinations.          Past Medical History:  Diagnosis Date  . Allergy   . Dry eyes   . GERD (gastroesophageal reflux disease)   . Glaucoma   . Hx of adenomatous colonic polyps 07/08/2006   06/2006 - diminutive adenoma 04/19/2015 - diminutive adenoma and one polyp lost - repeat colonoscopy 2021  . Hypertension   .  Post-operative  nausea and vomiting   . Sleep apnea    wears CPAP  . Thyroid disease    long ago- half thyroid removed ~20 yrs ago          Past Surgical History:  Procedure Laterality Date  . BREATH TEK H PYLORI N/A 12/24/2014   Procedure: BREATH TEK H PYLORI;  Surgeon: Alphonsa Overall, MD;  Location: [facility];  Service: General;  Laterality: N/A;  . CHOLECYSTECTOMY    . COLONOSCOPY    . EYE SURGERY     age 45   . 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 POLYPECTOMY    . SHOULDER SURGERY     left shoulder  . THYROID SURGERY           Family History  Problem Relation Age of Onset  . Diverticulitis Mother   . Colon cancer Father        in his 11's dx'd   . Cancer Maternal Grandmother   . Esophageal cancer Neg Hx   . Stomach cancer Neg Hx   . Rectal cancer Neg Hx   . 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  Reports that he quit smoking about 36 years ago. His smoking use included cigarettes. He quit after 10.00 years of use. He has never used smokeless tobacco. He reports current alcohol use of about 2.0 standard drinks of alcohol per week. He reports that he does not use drugs.        Allergies  Allergen Reactions  . Demerol [Meperidine]     Large doses cause nausea and vomiting          Medications Prior to Admission  Medication Sig Dispense Refill  . amLODipine (NORVASC) 5 MG tablet Take 5 mg by mouth daily.    Marland Kitchen ibuprofen (ADVIL) 200 MG tablet Take 200-400 mg by mouth every 6 (six) hours as needed for headache or moderate pain.    Marland Kitchen losartan (COZAAR) 100 MG tablet Take 100 mg by mouth daily.    . Multiple Vitamins-Minerals (ONE-A-DAY MENS 50+) TABS Take 1 tablet by mouth daily.    Marland Kitchen omeprazole (PRILOSEC) 20 MG capsule Take 20 mg by mouth daily.       Home: Home Living Family/patient expects to be discharged to:: Private  residence Living Arrangements: Spouse/significant other Available Help at Discharge: Family,Available PRN/intermittently Type of Home: House Home Access: Stairs to enter [employer] of Steps: 3 Entrance Stairs-Rails: Right Home Layout: One level Bathroom Shower/Tub: Tub/shower Psychologist, occupational: Standard Home Equipment: Shower seat - built in  Functional History: Prior Function Level of Independence: Independent Comments: Pt was fully independent.  He works as a Corporate investment banker.  He enjoys playing golf Functional Status:  Mobility: Bed Mobility Overal bed mobility: Needs Assistance Bed Mobility: Supine to Sit Supine to sit: Mod assist,+2 for safety/equipment,HOB elevated General bed mobility comments: cues for sequencing.  He required assist to initiate movement and assist to move LEs off the bed and to lift trunk Transfers Overall transfer level: Needs assistance Equipment used: 2 person hand held assist Transfers: Sit to/from [place] to Stand: Max assist,+2 physical assistance,+2 safety/equipment Stand pivot transfers: Mod assist,+2 physical assistance,+2 safety/equipment General transfer comment: Pt attempted to stand x 2 with mod A +2 progressing to max A +2 but unable to fully extend hips and trunk.  He transferred to the recliner  with mod A +2 to lift buttocks, for balance, and to pivot feet. Knees buckling partially Ambulation/Gait General Gait Details: unable to step feet safely  ADL: ADL Overall ADL's : Needs assistance/impaired Eating/Feeding: NPO Grooming: Wash/dry hands,Wash/dry face,Moderate assistance,Sitting Upper Body Bathing: Maximal assistance,Sitting Lower Body Bathing: Maximal assistance,Sit to/from stand,Sitting/lateral leans Upper Body Dressing : Maximal assistance,Sitting Lower Body Dressing: Total assistance,Sit to/from stand,Sitting/lateral leans Toilet Transfer: Moderate assistance,+2 for  physical assistance,+2 for safety/equipment,Squat-pivot,BSC Toileting- Clothing Manipulation and Hygiene: Total assistance,Sitting/lateral lean,Sit to/from stand Functional mobility during ADLs: Moderate assistance,+2 for physical assistance,+2 for safety/equipment (squat pivot transfer)  Cognition: Cognition Overall Cognitive Status: Impaired/Different from baseline Orientation Level: Oriented to person,Oriented to place,Oriented to situation,Disoriented to time Cognition Arousal/Alertness: Awake/alert Behavior During Therapy: Flat affect Overall Cognitive Status: Impaired/Different from baseline Area of Impairment: Attention,Following commands,Problem solving,Orientation Orientation Level: Situation Current Attention Level: Sustained Following Commands: Follows one step commands consistently,Follows one step commands inconsistently Problem Solving: Slow processing,Decreased initiation,Requires verbal cues General Comments: Pt oriented to time, hospital, but not fully oriented to situation.  He follows commands consistently, but is slow to process info.  Speech is difficult to understand.   Blood pressure 140/75, pulse 67, temperature 98.8 F (37.1 C), temperature source Axillary, resp. rate 20, height 5' 10.98" (1.803 m), weight 108.5 kg, SpO2 96 %. Physical Exam Vitals and nursing note reviewed.  Constitutional:      Appearance: He is obese.  HENT:     Head: Normocephalic and atraumatic.     Mouth/Throat:     Mouth: Mucous membranes are dry.     Pharynx: No posterior oropharyngeal erythema.  Eyes:     Extraocular Movements: Extraocular movements intact.  Cardiovascular:     Rate and Rhythm: Normal rate and regular rhythm.     Pulses: Normal pulses.     Heart sounds: Normal heart sounds. No murmur heard.   Pulmonary:     Effort: Pulmonary effort is normal. No respiratory distress.     Breath sounds: Normal breath sounds.  Abdominal:     General: Abdomen is flat. Bowel  sounds are normal.     Palpations: Abdomen is soft.  Musculoskeletal:     Cervical back: No rigidity.  Neurological:     Mental Status: He is alert. He is disoriented.     Cranial Nerves: Dysarthria present.     Gait: Gait abnormal.     Comments: Oriented to person and place  Strength is 4/5 bilateral deltoid bicep tricep grip 4 - at bilateral hip flexor knee extensor ankle dorsiflexor Sensation intact bilaterally in upper and lower extremities.  Psychiatric:        Mood and Affect: Affect is blunt.        Speech: Speech is delayed.     Lab Results Last 24 Hours       Results for orders placed or performed during the hospital encounter of 04/29/21 (from the past 24 hour(s))  Glucose, capillary     Status: Abnormal   Collection Time: 05/11/21 11:17 AM  Result Value Ref Range   Glucose-Capillary 136 (H) 70 - 99 mg/dL  Glucose, capillary     Status: Abnormal   Collection Time: 05/11/21  3:40 PM  Result Value Ref Range   Glucose-Capillary 102 (H) 70 - 99 mg/dL  Glucose, capillary     Status: Abnormal   Collection Time: 05/11/21  7:51 PM  Result Value Ref Range   Glucose-Capillary 121 (H) 70 - 99 mg/dL  Glucose, capillary  Status: Abnormal   Collection Time: 05/11/21 11:34 PM  Result Value Ref Range   Glucose-Capillary 125 (H) 70 - 99 mg/dL   Comment 1 Notify RN   CBC     Status: Abnormal   Collection Time: 05/12/21 12:59 AM  Result Value Ref Range   WBC 16.8 (H) 4.0 - 10.5 K/uL   RBC 4.30 4.22 - 5.81 MIL/uL   Hemoglobin 13.7 13.0 - 17.0 g/dL   HCT 41.6 39.0 - 52.0 %   MCV 96.7 80.0 - 100.0 fL   MCH 31.9 26.0 - 34.0 pg   MCHC 32.9 30.0 - 36.0 g/dL   RDW 13.2 11.5 - 15.5 %   Platelets 175 150 - 400 K/uL   nRBC 0.0 0.0 - 0.2 %  Basic metabolic panel     Status: Abnormal   Collection Time: 05/12/21 12:59 AM  Result Value Ref Range   Sodium 142 135 - 145 mmol/L   Potassium 3.7 3.5 - 5.1 mmol/L   Chloride 107 98 - 111 mmol/L   CO2 29 22  - 32 mmol/L   Glucose, Bld 111 (H) 70 - 99 mg/dL   BUN 27 (H) 8 - 23 mg/dL   Creatinine, Ser 0.76 0.61 - 1.24 mg/dL   Calcium 8.0 (L) 8.9 - 10.3 mg/dL   GFR, Estimated >60 >60 mL/min   Anion gap 6 5 - 15  Magnesium     Status: None   Collection Time: 05/12/21 12:59 AM  Result Value Ref Range   Magnesium 2.1 1.7 - 2.4 mg/dL  Phosphorus     Status: None   Collection Time: 05/12/21 12:59 AM  Result Value Ref Range   Phosphorus 3.1 2.5 - 4.6 mg/dL  Glucose, capillary     Status: Abnormal   Collection Time: 05/12/21  3:07 AM  Result Value Ref Range   Glucose-Capillary 106 (H) 70 - 99 mg/dL  Glucose, capillary     Status: Abnormal   Collection Time: 05/12/21  7:20 AM  Result Value Ref Range   Glucose-Capillary 102 (H) 70 - 99 mg/dL      Imaging Results (Last 48 hours)  DG Abd 1 View  Result Date: 05/11/2021 CLINICAL DATA:  60 year old male status post nasogastric tube placement. EXAM: ABDOMEN - 1 VIEW COMPARISON:  05/09/2021 FINDINGS: Nonspecific bowel gas pattern with mild gaseous distension of the small and large bowel, similar to comparison. Gastric decompression tube is kinked at its proximal side hole in the gastric fundus with the tip oriented toward the gastroesophageal junction. Cholecystectomy clips in the right upper quadrant. No acute osseous abnormality. IMPRESSION: Gastric decompression tube is kinked at its proximal side hole within the gastric fundus. The tip of the catheter is oriented toward the gastroesophageal junction. Continued advancement into the stomach may improve function. Electronically Signed   By: Ruthann Cancer MD   On: 05/11/2021 10:26   CT CHEST WO CONTRAST  Result Date: 05/10/2021 CLINICAL DATA:  Fever of unknown origin. EXAM: CT CHEST WITHOUT CONTRAST TECHNIQUE: Multidetector CT imaging of the chest was performed following the standard protocol without IV contrast. COMPARISON:  Chest radiograph, 05/09/2021 and older studies. FINDINGS:  Cardiovascular: Normal sized heart. No pericardial effusion. No coronary artery calcifications. Great vessels normal in caliber. No aortic atherosclerosis. Mediastinum/Nodes: Previous left thyroidectomy. Right thyroid lobe is enlarged and heterogeneous consistent with nodules. Either combined nodules or 1 large nodule spans 4.2 cm. No mediastinal or hilar masses or enlarged lymph nodes. Trachea and esophagus are unremarkable. Lungs/Pleura: There is  consolidation in the posterior base of the right lower lobe consistent with pneumonia. There is minor atelectasis in the dependent left lower lobe. Focus of calcification in the right middle lobe. Mild scarring at the right apex. Remainder of the lungs is clear. No pleural effusion or pneumothorax. Upper Abdomen: No acute findings. 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 Left adrenal mass, 3.3 cm, average Hounsfield units of 54, nonspecific. Status post cholecystectomy. Musculoskeletal: No fracture or acute finding. No aggressive bone lesion. IMPRESSION: 1. Consolidation in the posterior base of the right lower lobe consistent with pneumonia. 2. No other acute abnormality. 3. Bilateral adrenal masses, the left measuring 3.3 cm, indeterminate, not meeting criteria for an adenoma on unenhanced imaging. Recommend follow-up adrenal MRI for further assessment. Right adrenal myelolipoma, benign. Electronically Signed   By: Lajean Manes M.D.   On: 05/10/2021 10:53   DG Chest Port 1 View  Result Date: 05/11/2021 CLINICAL DATA:  Atelectasis EXAM: PORTABLE CHEST 1 VIEW COMPARISON:  05/09/2021 FINDINGS: Lungs are clear.  No pleural effusion or pneumothorax. The heart is top-normal in size. Right arm PICC terminates in the mid SVC. Surgical clips along the left neck/mediastinum. IMPRESSION: No evidence of acute cardiopulmonary disease. Right arm PICC terminates in the mid SVC. Electronically Signed   By: Julian Hy M.D.   On: 05/11/2021 08:17       Assessment/Plan: Diagnosis: Meningitis 1. Does the need for close, 24 hr/day medical supervision in concert with the patient's rehab needs make it unreasonable for this patient to be served in a less intensive setting? Yes 2. Co-Morbidities requiring supervision/potential complications: Hypertension right lower lobe pneumonia, atrial fibrillation and flutter 3. Due to bladder management, bowel management, safety, skin/wound care, disease management, medication administration, pain management and patient education, does the patient require 24 hr/day rehab nursing? Yes 4. Does the patient require coordinated care of a physician, rehab nurse, therapy disciplines of PT, OT, speech to address physical and functional deficits in the context of the above medical diagnosis(es)? Yes Addressing deficits in the following areas: balance, endurance, locomotion, strength, transferring, bowel/bladder control, bathing, dressing, feeding, grooming, toileting, cognition, swallowing and psychosocial support 5. Can the patient actively participate in an intensive therapy program of at least 3 hrs of therapy per day at least 5 days per week? Yes 6. The potential for patient to make measurable gains while on inpatient rehab is good 7. Anticipated functional outcomes upon discharge from inpatient rehab are min assist  with PT, min assist with OT, supervision with SLP. 8. Estimated rehab length of stay to reach the above functional goals is: 21 to 25 days 9. Anticipated discharge destination: Home 10. Overall Rehab/Functional Prognosis: good  RECOMMENDATIONS: This patient's condition is appropriate for continued rehabilitative care in the following setting: CIR Patient has agreed to participate in recommended program. Yes Note that insurance prior authorization may be required for reimbursement for recommended care.  Comment: Need to establish caregiver availability and capability   Bary Leriche,  PA-C 05/12/2021   "I have personally performed a face to face diagnostic evaluation of this patient.  Additionally, I have reviewed and concur with the physician assistant's documentation above." Charlett Blake M.D. [employer] Fellow Am [employer] Re[employer] Diplomate Am Board of Electrodiagnostic Med Fellow Am Board of Interventional Pain

## 2021-05-19 NOTE — H&P (Signed)
Physical Medicine and Rehabilitation Admission H&P        Chief Complaint  Patient presents with  . Code Stroke  : HPI: Hayden Mcbride is a 60 year old right-handed male with history of hypertension, OSA, glaucoma, hearing loss, reports of recent visit to [place].  Per chart review patient lives with spouse independent prior to admission.  Works as a Corporate investment banker.   Admitted 04/29/2020 with altered mental status changes speech difficulties lethargy headache right gaze preference and became progressively agitated in the ED requiring intubation for airway protection.  MRI of the brain showed diffuse leptomeningeal enhancement and abnormal FLAIR signal throughout subarachnoid spaces compatible with acute meningitis as well as punctate DWI foci along midline parieto-occipital foci with question of trace purulence.  CTA head and neck was negative for LVO or high-grade stenosis and showed incidental 4.7 cm right thyroid nodule.  LP completed showing marked xanthochromia with elevated opening pressure consistent with encephalitis and was started on acyclovir as well as broad-spectrum antibiotics.  CSF revealed neutrophilic pleocytosis with WBC 585, protein greater than 600 glucose less than 20 and gram stain showing few Streptococcus pneumonia.  Blood cultures x2 negative.  EEG without evidence of seizure.  Antibiotics narrowed to high-dose penicillin per ID input.  Patient continued to have some increased lethargy decrease in movement disconjugate gaze as well as decrease in ability to follow commands.  He was noted to have decreasing movement of left lower extremity.  Neurology recommended repeat CT of the head 05/05/2021 which showed increased density in some of the dural venous sinuses question hemoconcentration versus thrombus.  MRI/MRV brain repeated showing changes consistent with bacterial meningitis and increase in numerous foci of restricted diffusion about subarachnoid spaces with areas of  septation and synechie formation consistent with bacterial meningitis, no evidence of bacterial ventriculitis or venous thrombosis.  He developed fevers with leukocytosis and sputum cultures positive for MRSA.  He was started on linezolid and tolerated extubation to BiPAP 05/09/2021.  CTA chest 5/28 showed consolidation at the posterior base right lower lobe consistent with pneumonia as well as incidental bilateral adrenal masses with recommendations of adrenal MRI for further assessment.  Echocardiogram showed ejection fraction 65 to 70% no valve abnormality.  He did develop A. fib/a flutter felt to be due to stress of underlying Eliquis and was started on amiodarone for rate control.  On cortrak for tube feeds and electrolyte abnormalities treated with D5W as well as potassium/magnesium replacement and diet has been advanced to a dysphagia #3 honey thick liquid.  Therapy evaluations completed due to patient decreased functional ability cognitive deficits was admitted for a comprehensive rehab program.   Review of Systems  Constitutional: Positive for fever.  HENT: Positive for hearing loss.   Eyes: Negative for blurred vision and double vision.  Respiratory: Negative for cough and shortness of breath.   Cardiovascular: Positive for leg swelling. Negative for chest pain and palpitations.  Gastrointestinal: Positive for constipation. Negative for heartburn, nausea and vomiting.  Genitourinary: Positive for frequency.  Musculoskeletal: Positive for myalgias.  Skin: Negative for rash.  Neurological: Positive for weakness.  Psychiatric/Behavioral: Positive for memory loss. The patient has insomnia.   All other systems reviewed and are negative.       Past Medical History:  Diagnosis Date  . Allergy    . Dry eyes    . GERD (gastroesophageal reflux disease)    . Glaucoma    . Hx of adenomatous colonic polyps 07/08/2006  06/2006 - diminutive adenoma 04/19/2015 - diminutive adenoma and one polyp lost -  repeat colonoscopy 2021  . Hypertension    . Post-operative nausea and vomiting    . Sleep apnea      wears CPAP  . Thyroid disease      long ago- half thyroid removed ~20 yrs ago          Past Surgical History:  Procedure Laterality Date  . BREATH TEK H PYLORI N/A 12/24/2014    Procedure: BREATH TEK H PYLORI;  Surgeon: Alphonsa Overall, MD;  Location: [facility];  Service: General;  Laterality: N/A;  . CHOLECYSTECTOMY      . COLONOSCOPY      . EYE SURGERY        age 67   . INNER EAR SURGERY        tumor inside right ear  . POLYPECTOMY      . SHOULDER SURGERY        left shoulder  . THYROID SURGERY             Family History  Problem Relation Age of Onset  . Diverticulitis Mother    . Colon cancer Father          in his 12's dx'd   . Cancer Maternal Grandmother    . Esophageal cancer Neg Hx    . Stomach cancer Neg Hx    . Rectal cancer Neg Hx    . Colon polyps Neg Hx      Social History:  reports that he quit smoking about 36 years ago. His smoking use included cigarettes. He quit after 10.00 years of use. He has never used smokeless tobacco. He reports current alcohol use of about 2.0 standard drinks of alcohol per week. He reports that he does not use drugs. Allergies:       Allergies  Allergen Reactions  . Demerol [Meperidine]        Large doses cause nausea and vomiting    Medications Prior to Admission  Medication Sig Dispense Refill  . amLODipine (NORVASC) 5 MG tablet Take 5 mg by mouth daily.      Marland Kitchen ibuprofen (ADVIL) 200 MG tablet Take 200-400 mg by mouth every 6 (six) hours as needed for headache or moderate pain.      Marland Kitchen losartan (COZAAR) 100 MG tablet Take 100 mg by mouth daily.      . Multiple Vitamins-Minerals (ONE-A-DAY MENS 50+) TABS Take 1 tablet by mouth daily.      Marland Kitchen omeprazole (PRILOSEC) 20 MG capsule Take 20 mg by mouth daily.           Drug Regimen Review Drug regimen was reviewed and remains appropriate with no significant issues identified    Home: Home Living Family/patient expects to be discharged to:: Private residence Living Arrangements: Spouse/significant other Available Help at Discharge: Family,Available PRN/intermittently Type of Home: House Home Access: Stairs to enter [employer] of Steps: 3 Entrance Stairs-Rails: Right Home Layout: One level Bathroom Shower/Tub: Tub/shower Psychologist, occupational: Standard Home Equipment: Civil engineer, contracting - built in  Lives With: Spouse   Functional History: Prior Function Level of Independence: Independent Comments: Pt was fully independent.  He works as a Corporate investment banker.  He enjoys playing golf   Functional Status:  Mobility: Bed Mobility Overal bed mobility: Needs Assistance Bed Mobility: Sit to Sidelying Supine to sit: Min guard,HOB elevated Sit to supine: Min guard Sit to sidelying: Min assist General bed mobility comments: Assist to  bring legs back up into the bed Transfers Overall transfer level: Needs assistance Equipment used: Rolling walker (2 wheeled) Transfers: Sit to/from [employer] Sit to Stand: Min assist Stand pivot transfers: Min assist Squat pivot transfers: Mod assist General transfer comment: Assist to bring hips up and for balance. Verbal cues for hand placement. Chair to bed with stand pivot using walker Ambulation/Gait Ambulation/Gait assistance: +2 safety/equipment,Min assist Gait Distance (Feet): 6 Feet Assistive device: Rolling walker (2 wheeled) Gait Pattern/deviations: Shuffle,Step-through pattern,Decreased step length - right,Decreased step length - left General Gait Details: Assist for balance and support. Slow initiation of steps. Followed with chair for safety. Gait velocity: decr Gait velocity interpretation: <1.31 ft/sec, indicative of household ambulator   ADL: ADL Overall ADL's : Needs assistance/impaired Eating/Feeding: NPO Grooming: Wash/dry hands,Wash/dry face,Brushing hair,Set  up,Supervision/safety,Sitting Upper Body Bathing: Maximal assistance,Sitting Lower Body Bathing: Maximal assistance,Sit to/from stand,Sitting/lateral leans Upper Body Dressing : Maximal assistance,Sitting Lower Body Dressing: Maximal assistance,Sit to/from stand Lower Body Dressing Details (indicate cue type and reason): Pt able to don/doff socks with min A while seated EOB.  He was too fagitued to stand up to simulate pulling pants over hips Toilet Transfer: Moderate assistance,Squat-pivot,BSC Toileting- Clothing Manipulation and Hygiene: Total assistance,Sitting/lateral lean,Sit to/from stand Functional mobility during ADLs: Moderate assistance   Cognition: Cognition Overall Cognitive Status: Impaired/Different from baseline Orientation Level: Oriented X4 Cognition Arousal/Alertness: Awake/alert Behavior During Therapy: Flat affect Overall Cognitive Status: Impaired/Different from baseline Area of Impairment: Attention,Following commands,Problem solving Orientation Level: Situation Current Attention Level: Selective Memory: Decreased short-term memory Following Commands: Follows one step commands consistently,Follows multi-step commands inconsistently Safety/Judgement: Decreased awareness of deficits,Decreased awareness of safety Problem Solving: Slow processing,Requires verbal cues,Decreased initiation,Requires tactile cues General Comments: Pt demonstrates delayed processing and requires cues to initiate   Physical Exam: Blood pressure 132/84, pulse 96, temperature 98.5 F (36.9 C), temperature source Oral, resp. rate 15, height 5' 10.98" (1.803 m), weight 104.7 kg, SpO2 100 %. Physical Exam Constitutional:      General: He is not in acute distress.    Appearance: He is obese.  HENT:     Head: Normocephalic and atraumatic.     Right Ear: External ear normal.     Left Ear: External ear normal.     Nose: Nose normal.     Mouth/Throat:     Comments: Thick coating of thrush on  tongue Eyes:     Extraocular Movements: Extraocular movements intact.     Pupils: Pupils are equal, round, and reactive to light.  Cardiovascular:     Rate and Rhythm: Normal rate and regular rhythm.     Heart sounds: No murmur heard. No gallop.   Pulmonary:     Effort: Pulmonary effort is normal. No respiratory distress.     Breath sounds: No wheezing.  Abdominal:     General: Bowel sounds are normal. There is no distension.     Palpations: Abdomen is soft.     Tenderness: There is no abdominal tenderness.  Musculoskeletal:     Cervical back: Normal range of motion.     Right lower leg: Edema present.     Left lower leg: Edema present.  Skin:    General: Skin is warm.     Comments: Dry, flaky skin on both feet. Chronic stasis changes/vasculitic changes in bilateral feet.   Neurological:     Cranial Nerves: No cranial nerve deficit.     Comments: Patient is alert.  No acute distress.  Makes eye contact with examiner.  Dysarthric speech but intelligible.  Oriented to person and place, date, but not time.  Limited medical historian.  Follows simple commands. HOH R>L. Motor 4+/5 BUE, 3/5 prox BLE to 4-/5 distally. Decreased LT in stocking glove distribution in both feet, slightly in finger tips.   Psychiatric:     Comments: Flat, but generally pleasant and appropriate        Lab Results Last 48 Hours        Results for orders placed or performed during the hospital encounter of 04/29/21 (from the past 48 hour(s))  Glucose, capillary     Status: None    Collection Time: 05/17/21 11:22 AM  Result Value Ref Range    Glucose-Capillary 98 70 - 99 mg/dL      Comment: Glucose reference range applies only to samples taken after fasting for at least 8 hours.  Glucose, capillary     Status: None    Collection Time: 05/17/21  4:08 PM  Result Value Ref Range    Glucose-Capillary 98 70 - 99 mg/dL      Comment: Glucose reference range applies only to samples taken after fasting for at least  8 hours.  Glucose, capillary     Status: Abnormal    Collection Time: 05/17/21  7:55 PM  Result Value Ref Range    Glucose-Capillary 107 (H) 70 - 99 mg/dL      Comment: Glucose reference range applies only to samples taken after fasting for at least 8 hours.  Glucose, capillary     Status: Abnormal    Collection Time: 05/17/21 11:35 PM  Result Value Ref Range    Glucose-Capillary 101 (H) 70 - 99 mg/dL      Comment: Glucose reference range applies only to samples taken after fasting for at least 8 hours.  Comprehensive metabolic panel     Status: Abnormal    Collection Time: 05/18/21  2:21 AM  Result Value Ref Range    Sodium 137 135 - 145 mmol/L    Potassium 4.1 3.5 - 5.1 mmol/L    Chloride 105 98 - 111 mmol/L    CO2 26 22 - 32 mmol/L    Glucose, Bld 132 (H) 70 - 99 mg/dL      Comment: Glucose reference range applies only to samples taken after fasting for at least 8 hours.    BUN 15 8 - 23 mg/dL    Creatinine, Ser 0.68 0.61 - 1.24 mg/dL    Calcium 8.4 (L) 8.9 - 10.3 mg/dL    Total Protein 5.9 (L) 6.5 - 8.1 g/dL    Albumin 2.0 (L) 3.5 - 5.0 g/dL    AST 26 15 - 41 U/L    ALT 82 (H) 0 - 44 U/L    Alkaline Phosphatase 48 38 - 126 U/L    Total Bilirubin 0.7 0.3 - 1.2 mg/dL    GFR, Estimated >60 >60 mL/min      Comment: (NOTE) Calculated using the CKD-EPI Creatinine Equation (2021)      Anion gap 6 5 - 15      Comment: Performed at [facility], [place] [address], [place] [address]  CBC     Status: Abnormal    Collection Time: 05/18/21  2:21 AM  Result Value Ref Range    WBC 13.9 (H) 4.0 - 10.5 K/uL    RBC 4.28 4.22 - 5.81 MIL/uL    Hemoglobin 13.5 13.0 - 17.0 g/dL    HCT 40.7 39.0 - 52.0 %  MCV 95.1 80.0 - 100.0 fL    MCH 31.5 26.0 - 34.0 pg    MCHC 33.2 30.0 - 36.0 g/dL    RDW 13.3 11.5 - 15.5 %    Platelets 279 150 - 400 K/uL    nRBC 0.0 0.0 - 0.2 %      Comment: Performed at [facility], [place] [address], [place], [place] [address]  Glucose,  capillary     Status: Abnormal    Collection Time: 05/18/21  4:37 AM  Result Value Ref Range    Glucose-Capillary 114 (H) 70 - 99 mg/dL      Comment: Glucose reference range applies only to samples taken after fasting for at least 8 hours.  Glucose, capillary     Status: Abnormal    Collection Time: 05/18/21  7:39 AM  Result Value Ref Range    Glucose-Capillary 150 (H) 70 - 99 mg/dL      Comment: Glucose reference range applies only to samples taken after fasting for at least 8 hours.  Glucose, capillary     Status: Abnormal    Collection Time: 05/18/21 10:05 AM  Result Value Ref Range    Glucose-Capillary 106 (H) 70 - 99 mg/dL      Comment: Glucose reference range applies only to samples taken after fasting for at least 8 hours.  Glucose, capillary     Status: Abnormal    Collection Time: 05/18/21 12:12 PM  Result Value Ref Range    Glucose-Capillary 106 (H) 70 - 99 mg/dL      Comment: Glucose reference range applies only to samples taken after fasting for at least 8 hours.  Glucose, capillary     Status: Abnormal    Collection Time: 05/18/21  3:37 PM  Result Value Ref Range    Glucose-Capillary 125 (H) 70 - 99 mg/dL      Comment: Glucose reference range applies only to samples taken after fasting for at least 8 hours.  Glucose, capillary     Status: Abnormal    Collection Time: 05/18/21  8:18 PM  Result Value Ref Range    Glucose-Capillary 112 (H) 70 - 99 mg/dL      Comment: Glucose reference range applies only to samples taken after fasting for at least 8 hours.  Glucose, capillary     Status: Abnormal    Collection Time: 05/18/21 11:13 PM  Result Value Ref Range    Glucose-Capillary 117 (H) 70 - 99 mg/dL      Comment: Glucose reference range applies only to samples taken after fasting for at least 8 hours.  Glucose, capillary     Status: Abnormal    Collection Time: 05/19/21  3:03 AM  Result Value Ref Range    Glucose-Capillary 115 (H) 70 - 99 mg/dL      Comment: Glucose  reference range applies only to samples taken after fasting for at least 8 hours.  Comprehensive metabolic panel     Status: Abnormal    Collection Time: 05/19/21  4:23 AM  Result Value Ref Range    Sodium 137 135 - 145 mmol/L    Potassium 4.1 3.5 - 5.1 mmol/L    Chloride 104 98 - 111 mmol/L    CO2 28 22 - 32 mmol/L    Glucose, Bld 120 (H) 70 - 99 mg/dL      Comment: Glucose reference range applies only to samples taken after fasting for at least 8 hours.    BUN 17 8 -  23 mg/dL    Creatinine, Ser 0.65 0.61 - 1.24 mg/dL    Calcium 8.6 (L) 8.9 - 10.3 mg/dL    Total Protein 6.5 6.5 - 8.1 g/dL    Albumin 2.2 (L) 3.5 - 5.0 g/dL    AST 23 15 - 41 U/L    ALT 72 (H) 0 - 44 U/L    Alkaline Phosphatase 52 38 - 126 U/L    Total Bilirubin 0.8 0.3 - 1.2 mg/dL    GFR, Estimated >60 >60 mL/min      Comment: (NOTE) Calculated using the CKD-EPI Creatinine Equation (2021)      Anion gap 5 5 - 15      Comment: Performed at [facility], [place] [address], [place], [place] [address]  CBC     Status: Abnormal    Collection Time: 05/19/21  4:23 AM  Result Value Ref Range    WBC 12.9 (H) 4.0 - 10.5 K/uL    RBC 4.55 4.22 - 5.81 MIL/uL    Hemoglobin 14.3 13.0 - 17.0 g/dL    HCT 43.5 39.0 - 52.0 %    MCV 95.6 80.0 - 100.0 fL    MCH 31.4 26.0 - 34.0 pg    MCHC 32.9 30.0 - 36.0 g/dL    RDW 13.6 11.5 - 15.5 %    Platelets 297 150 - 400 K/uL    nRBC 0.0 0.0 - 0.2 %      Comment: Performed at [facility], [place] [address], [place], [place] [address]  Glucose, capillary     Status: Abnormal    Collection Time: 05/19/21  7:04 AM  Result Value Ref Range    Glucose-Capillary 111 (H) 70 - 99 mg/dL      Comment: Glucose reference range applies only to samples taken after fasting for at least 8 hours.      Imaging Results (Last 48 hours)  No results found.           Medical Problem List and Plan: 1.  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  Patient has completed high-dose penicillin followed by infectious disease             -patient may shower             -ELOS/Goals: 21-24 days, min assist for PT, OT, SLP 2.  Antithrombotics: -DVT/anticoagulation: Subcutaneous heparin             -antiplatelet therapy: N/A 3. Pain Management: Voltaren gel 3 times daily as needed, Tylenol as needed 4. Mood: Provide emotional support             -antipsychotic agents: N/A 5. Neuropsych: This patient is capable of making decisions on his own behalf. 6. Skin/Wound Care: Routine skin checks 7. Fluids/Electrolytes/Nutrition: Routine in and outs with follow-up chemistries 8.  Dysphagia.  Dysphagia #3 honey thick liquids.  Tolerating diet             -NGT removed today 9.  Atrial fibrillation/flutter.  Amiodarone 200 mg twice daily.  Cardiac rate controlled at present 10.  MRSA pneumonia.  Patient completed 7-day course of linezolid as of 05/17/2021 11.  Hypertension.  Cardura 2 mg daily.  Monitor with increased mobility 12. Oral Thrush: diflucan 100mg  po x 5 days 13. HOH: hears best through left ear             -asked him to see if he could have hearing aids brought from home             -  able to hear fairly well if provider speaks loudly and clearly     Cathlyn Parsons, PA-C 05/19/2021   I have personally performed a face to face diagnostic evaluation of this patient and formulated the key components of the plan.  Additionally, I have personally reviewed laboratory data, imaging studies, as well as relevant notes and concur with the physician assistant's documentation above.  The patient's status has not changed from the original H&P.  Any changes in documentation from the acute care chart have been noted above.  Meredith Staggers, MD, [facility]

## 2021-05-19 NOTE — Progress Notes (Signed)
Physical Therapy Treatment Patient Details Name: Hayden Mcbride MRN: [id number] DOB: [birth year] Today's Date: 05/19/2021    History of Present Illness Pt is 60 yo male who presented on 04/29/21 with HA and ear pain became combative with slurred speech and intubated 5/18 > extubated 5/27. Found to have bacterial meningitis. PMH: HTN, GERD, OSA on CPAP, thyroid disease, colonic polyps.    PT Comments    Pt admitted with above diagnosis. Pt incr distance with ambulation today and balance was good overall as well as pt without LOB and steady on feet.  Cues needed for safety but overall improved.  Gooing to Rehab today.  Pt currently with functional limitations due to balance and endurance deficits. Pt will benefit from skilled PT to increase their independence and safety with mobility to allow discharge to the venue listed below.     Follow Up Recommendations  CIR;Supervision/Assistance - 24 hour     Equipment Recommendations  Rolling walker with 5" wheels;Wheelchair cushion (measurements PT);Wheelchair (measurements PT);3in1 (PT)    Recommendations for Other Services Rehab consult     Precautions / Restrictions Precautions Precautions: Fall Precaution Comments: generalized weakness, delayed processing Restrictions Weight Bearing Restrictions: No    Mobility  Bed Mobility               General bed mobility comments: Pt was in chair on arrival    Transfers Overall transfer level: Needs assistance Equipment used: Rolling walker (2 wheeled) Transfers: Sit to/from [employer] Transfers Sit to Stand: Min guard         General transfer comment: Verbal cues for hand placement.  Ambulation/Gait Ambulation/Gait assistance: +2 safety/equipment;Min assist Gait Distance (Feet): 120 Feet Assistive device: Rolling walker (2 wheeled) Gait Pattern/deviations: Step-through pattern;Decreased step length - right;Decreased step length - left Gait velocity: decr Gait velocity  interpretation: <1.31 ft/sec, indicative of household ambulator General Gait Details: Assist for balance and support. Cues at times to stay close to RW.  Cues for sequencing steps as pt fatigued. Noted trunk flexion as he fatigued as well. Slow initiation of steps. Followed with chair for safety.   Stairs             Wheelchair Mobility    Modified Rankin (Stroke Patients Only)       Balance Overall balance assessment: Needs assistance Sitting-balance support: No upper extremity supported;Feet supported Sitting balance-Leahy Scale: Good     Standing balance support: Bilateral upper extremity supported Standing balance-Leahy Scale: Poor Standing balance comment: walker and min assist for static standing                            Cognition Arousal/Alertness: Awake/alert Behavior During Therapy: Flat affect Overall Cognitive Status: Impaired/Different from baseline Area of Impairment: Attention;Following commands;Problem solving                 Orientation Level: Situation Current Attention Level: Selective Memory: Decreased short-term memory Following Commands: Follows one step commands consistently;Follows multi-step commands inconsistently Safety/Judgement: Decreased awareness of deficits;Decreased awareness of safety   Problem Solving: Slow processing;Requires verbal cues;Decreased initiation;Requires tactile cues General Comments: Pt demonstrates delayed processing and requires cues to initiate      Exercises      General Comments        Pertinent Vitals/Pain Pain Assessment: No/denies pain    Home Living  Prior Function            PT Goals (current goals can now be found in the care plan section) Acute Rehab PT Goals Patient Stated Goal: go home Progress towards PT goals: Progressing toward goals    Frequency    Min 3X/week      PT Plan Current plan remains appropriate    Co-evaluation               AM-PAC PT "6 Clicks" Mobility   Outcome Measure  Help needed turning from your back to your side while in a flat bed without using bedrails?: A Little Help needed moving from lying on your back to sitting on the side of a flat bed without using bedrails?: A Little Help needed moving to and from a bed to a chair (including a wheelchair)?: A Little Help needed standing up from a chair using your arms (e.g., wheelchair or bedside chair)?: A Little Help needed to walk in hospital room?: A Little Help needed climbing 3-5 steps with a railing? : Total 6 Click Score: 16    End of Session Equipment Utilized During Treatment: Gait belt Activity Tolerance: Patient tolerated treatment well Patient left: with call bell/phone within reach;in chair;with chair alarm set Nurse Communication: Mobility status PT Visit Diagnosis: Muscle weakness (generalized) (M62.81);Difficulty in walking, not elsewhere classified (R26.2);Unsteadiness on feet (R26.81)     Time: 7579-7282 PT Time Calculation (min) (ACUTE ONLY): 18 min  Charges:  $Gait Training: 8-22 mins                     Hayden Mcbride M,PT Acute Rehab Services [phone] (pager)   Hayden Mcbride 05/19/2021, 3:39 PM

## 2021-05-19 NOTE — Progress Notes (Signed)
Inpatient Rehabilitation Medication Review by a Pharmacist  A complete drug regimen review was completed for this patient to identify any potential clinically significant medication issues.  Clinically significant medication issues were identified:  no  Check AMION for pharmacist assigned to patient if future medication questions/issues arise during this admission.  Pharmacist comments: None  Time spent performing this drug regimen review (minutes):  10 minutes   Tad Moore 05/19/2021 3:37 PM

## 2021-05-19 NOTE — Progress Notes (Signed)
PMR Admission Coordinator Pre-Admission Assessment  Patient: Hayden Mcbride is an 60 y.o., male MRN: [id number] DOB: Oct 07, 1961 Height: 5' 10.98" (180.3 cm) Weight: 104.7 kg                                                                                                                                                  Insurance Information HMO:     PPO:  yes    PCP:      IPA:      80/20:      OTHER:  PRIMARY: : UMR/UHC Choice Plus- 80/20, [employer],  - GBEEFE#:[id number]      Subscriber:  Pt. Received approval from [email or web address]  For admission 6/7 for 7 days, with updates due 6/10 CM Name: Hayden Mcbride     Phone#:   [phone]   Fax#: [phone] Pre-Cert#: [id number]     Employer:  [employer] Date: 12/15/2019 - still active Deductible: $1,500 ($1,500 met) OOP Max: $3,750 ($2,357.35 met) CIR: 80% coverage, 20% co-isurance SNF: 80% coverage, 20% co-insurance, limited to 120 days/cal yr. Outpatient: $35 copay/100% coverage, limited to 120 visits/cal yr Home 80% coverage, 20% co-insurance, limited to 120 days/cal yr DME: 80% coverage, 20% co-insurance Providers: in network   SECONDARY: none      Policy#:       Phone#:    Development worker, community:       Phone#:   The Engineer, [employer]" for patients in Inpatient Rehabilitation Facilities with attached "Privacy Act [place] Records" was provided and verbally reviewed with: N/A  Emergency Contact Information         Contact Information    Name Relation Home Work Mobile   Mcbride,Hayden Spouse [phone]       Current Medical History  Patient Admitting Diagnosis: Bacterial Meningitis  History of Present Illness: Hayden Mcbride a 60 y.o.RH-malewith history of HTN, OSA, glaucoma,right hearing loss, reports of recent visit to [place] who was admitted on 04/29/2020 with mental status changes nonsensical speech, lethargy, headaches,right gaze preference and became progressively agitated in ED  requiring intubation for airway protection. MRI brain done showing diffuse leptomeningeal enhancement and abnormal FLAIR signal throughout subarachnoid spaces compatible with acute meningitis as well as punctate DWI foci along midline parieto-occipital sulci with question of trace purulence. CTA head/neck was negative for LVO or high-grade stenosis and showed incidental 4.7 cm right thyroid nodule. LP done showing marked xanthochromia with elevated opening pressure consistent with encephalitis and he was started on acyclovir as well as broad-spectrum antibiotics. CSF revealed neutrophilic pleocytosis with WBC 585, protein>600, glucose<20and gram stain showing few Streptococcus pneumonia. Blood cultures x2 negative. EEG without evidence of seizures. Antibiotics narrowed to high-dose penicillin per ID input.  He continued to have lethargy with decrease in movement,disconjugate gaze as well as decrease in ability to follow commands. He was noted to  have decreasing movement on LLE.Neurology recommended repeat CT head 5/23 which showed increased density in some of dural venous sinuses question hemoconcentration versus thrombus. MRI/MRV brain repeated showing changes consistent with bacterial meningitis and increase in numerous foci of restricted diffusion about subarachnoid spaces with areas of septation andsynechiaeformation consistent with bacterial meningitis, no evidence of bacterial ventriculitis or venous thrombosis. He developed fevers with leukocytosis and sputum cultures positive for MRSA. He was started on linezolid andtolerated extubation to BiPAP by 05/27.  CT chest on 05/28 showing consolidation at the posterior base right lower lobe consistent with pneumonia as well as incidental bilateral adrenal masses with recommendations of adrenal MRI for further assessment. 2D echo done showing EF 65 to 70% with no valve abnormality. He did develop A. fib/a flutter felt to be due to stress of  underlying illness and was started on amiodarone for rate control. On core track for tube feeds and electrolyte abnormalities treated with D5W as well as potassium/magnesium replacement. Therapy evaluations completed this weekend revealing pharyngeal dysphagiawith thick secretions and difficulty initiating swallow, cognitive deficits with delay in processing and generalized weaknessBUE>BLEwith bilateral knee instability affecting mobility and ADLs. CIR was recommended due to functional decline  Complete NIHSS TOTAL: 0 Glasgow Coma Scale Score: 15  Past Medical History      Past Medical History:  Diagnosis Date  . Allergy   . Dry eyes   . GERD (gastroesophageal reflux disease)   . Glaucoma   . Hx of adenomatous colonic polyps 07/08/2006   06/2006 - diminutive adenoma 04/19/2015 - diminutive adenoma and one polyp lost - repeat colonoscopy 2021  . Hypertension   . Post-operative nausea and vomiting   . Sleep apnea    wears CPAP  . Thyroid disease    long ago- half thyroid removed ~20 yrs ago     Family History  family history includes Cancer in his maternal grandmother; Colon cancer in his father; Diverticulitis in his mother.  Prior Rehab/Hospitalizations:  Has the patient had prior rehab or hospitalizations prior to admission? No  Has the patient had major surgery during 100 days prior to admission? Yes  Current Medications   Current Facility-Administered Medications:  .  acetaminophen (TYLENOL) tablet 650 mg, 650 mg, Oral, Q6H PRN, Caren Griffins, MD, 650 mg at 05/14/21 0852 .  amiodarone (PACERONE) tablet 200 mg, 200 mg, Per Tube, BID, Icard, Bradley L, DO, 200 mg at 05/19/21 1010 .  bisacodyl (DULCOLAX) suppository 10 mg, 10 mg, Rectal, Daily PRN, Desai, Rahul P, PA-C, 10 mg at 05/06/21 0941 .  Chlorhexidine Gluconate Cloth 2 % PADS 6 each, 6 each, Topical, Q0600, Collene Gobble, MD, 6 each at 05/19/21 [phone] .  diclofenac Sodium (VOLTAREN) 1 % topical  gel 2 g, 2 g, Topical, TID PRN, Shalhoub, Sherryll Burger, MD, 2 g at 05/18/21 2131 .  doxazosin (CARDURA) tablet 2 mg, 2 mg, Per Tube, Daily, Icard, Bradley L, DO, 2 mg at 05/19/21 1010 .  feeding supplement (PROSource TF) liquid 45 mL, 45 mL, Per Tube, QID, Gherghe, Costin M, MD, 45 mL at 05/19/21 1010 .  feeding supplement (VITAL 1.5 CAL) liquid 1,000 mL, 1,000 mL, Per Tube, Continuous, Caren Griffins, MD, Last Rate: 55 mL/hr at 05/19/21 0433, 1,000 mL at 05/19/21 0433 .  free water 250 mL, 250 mL, Per Tube, Q6H, Gherghe, Costin M, MD, 250 mL at 05/19/21 0429 .  heparin injection 5,000 Units, 5,000 Units, Subcutaneous, Q8H, Collene Gobble, MD, 5,000 Units at 05/19/21 0428 .  insulin aspart (novoLOG) injection 0-20 Units, 0-20 Units, Subcutaneous, TID WC, Rigoberto Noel, MD, 3 Units at 05/18/21 1604 .  MEDLINE mouth rinse, 15 mL, Mouth Rinse, BID, Byrum, Rose Fillers, MD, 15 mL at 05/18/21 2132 .  nystatin cream (MYCOSTATIN), , Topical, BID, Donne Hazel, MD, Given at 05/19/21 1011 .  sodium chloride flush (NS) 0.9 % injection 10-40 mL, 10-40 mL, Intracatheter, Q12H, Collene Gobble, MD, 10 mL at 05/18/21 2132 .  sodium chloride flush (NS) 0.9 % injection 10-40 mL, 10-40 mL, Intracatheter, PRN, Byrum, Rose Fillers, MD  Patients Current Diet:     Diet Order                  DIET DYS 3 Room service appropriate? Yes; Fluid consistency: Honey Thick  Diet effective now                 5/31 Cortrak for feeds placed  Precautions / Restrictions Precautions Precautions: Fall Precaution Comments: generalized weakness, delayed processing Restrictions Weight Bearing Restrictions: No   Has the patient had 2 or more falls or a fall with injury in the past year?No  Prior Activity Level Community (5-7x/wk): Pt. was working PTA  Prior Functional Level Prior Function Level of Independence: Independent Comments: Pt was fully independent.  He works as a Corporate investment banker.  He enjoys playing  golf  Self Care: Did the patient need help bathing, dressing, using the toilet or eating?  Independent  Indoor Mobility: Did the patient need assistance with walking from room to room (with or without device)? Independent  Stairs: Did the patient need assistance with internal or external stairs (with or without device)? Independent  Functional Cognition: Did the patient need help planning regular tasks such as shopping or remembering to take medications? Independent  Home Assistive Devices / Equipment Home Assistive Devices/Equipment: None Home Equipment: Shower seat - built in  Prior Device Use: Indicate devices/aids used by the patient prior to current illness, exacerbation or injury? None of the above  Current Functional Level Cognition  Overall Cognitive Status: Impaired/Different from baseline Current Attention Level: Selective Orientation Level: Oriented X4 Following Commands: Follows one step commands consistently,Follows multi-step commands inconsistently Safety/Judgement: Decreased awareness of deficits,Decreased awareness of safety General Comments: Pt demonstrates delayed processing and requires cues to initiate    Extremity Assessment (includes Sensation/Coordination)  Upper Extremity Assessment: Generalized weakness RUE Deficits / Details: shoulder grossly 2/5; elbow distally 4-/5 RUE Coordination: decreased fine motor,decreased gross motor LUE Deficits / Details: shoulder grossly 3-/5; elbow distally grossly 4-/5 LUE Coordination: decreased gross motor,decreased fine motor  Lower Extremity Assessment: Defer to PT evaluation    ADLs  Overall ADL's : Needs assistance/impaired Eating/Feeding: NPO Grooming: Wash/dry hands,Wash/dry face,Brushing hair,Set up,Supervision/safety,Sitting Upper Body Bathing: Maximal assistance,Sitting Lower Body Bathing: Maximal assistance,Sit to/from stand,Sitting/lateral leans Upper Body Dressing : Maximal  assistance,Sitting Lower Body Dressing: Maximal assistance,Sit to/from stand Lower Body Dressing Details (indicate cue type and reason): Pt able to don/doff socks with min A while seated EOB.  He was too fagitued to stand up to simulate pulling pants over hips Toilet Transfer: Moderate assistance,Squat-pivot,BSC Toileting- Clothing Manipulation and Hygiene: Total assistance,Sitting/lateral lean,Sit to/from stand Functional mobility during ADLs: Moderate assistance    Mobility  Overal bed mobility: Needs Assistance Bed Mobility: Sit to Sidelying Supine to sit: Min guard,HOB elevated Sit to supine: Min guard Sit to sidelying: Min assist General bed mobility comments: Assist to bring legs back up into the bed    Transfers  Overall transfer level: Needs assistance Equipment used: Rolling walker (2 wheeled) Transfers: Sit to/from [employer] Sit to Stand: Min assist Stand pivot transfers: Min assist Squat pivot transfers: Mod assist General transfer comment: Assist to bring hips up and for balance. Verbal cues for hand placement. Chair to bed with stand pivot using walker    Ambulation / Gait / Stairs / Wheelchair Mobility  Ambulation/Gait Ambulation/Gait assistance: +2 safety/equipment,Min assist Gait Distance (Feet): 6 Feet Assistive device: Rolling walker (2 wheeled) Gait Pattern/deviations: Shuffle,Step-through pattern,Decreased step length - right,Decreased step length - left General Gait Details: Assist for balance and support. Slow initiation of steps. Followed with chair for safety. Gait velocity: decr Gait velocity interpretation: <1.31 ft/sec, indicative of household ambulator    Posture / Balance Dynamic Sitting Balance Sitting balance - Comments: able to don/doff seated EOB without LOB Balance Overall balance assessment: Needs assistance Sitting-balance support: No upper extremity supported,Feet supported Sitting balance-Leahy Scale: Good Sitting  balance - Comments: able to don/doff seated EOB without LOB Standing balance support: Bilateral upper extremity supported Standing balance-Leahy Scale: Poor Standing balance comment: walker and min assist for static standing    Special needs/care consideration Contact precautions for MRSA    Previous Home Environment  Living Arrangements: Spouse/significant other  Lives With: Spouse Available Help at Discharge: Family,Available PRN/intermittently Type of Home: House Home Layout: One level Home Access: Stairs to enter Entrance Stairs-Rails: Right Entrance Stairs-Number of Steps: 3 Bathroom Shower/Tub: Tub/shower Psychologist, occupational: Standard Home Care Services: No  Discharge Living Setting Plans for Discharge Living Setting: Patient's home Type of Home at Discharge: House Discharge Home Layout: One level Discharge Home Access: Stairs to enter Entrance Stairs-Rails: Can reach both Entrance Stairs-Number of Steps: 3-4 Discharge Bathroom Shower/Tub: Tub/shower unit Discharge Bathroom Toilet: Handicapped height Discharge Bathroom Accessibility: Yes How Accessible: Accessible via walker Does the patient have any problems obtaining your medications?: No  Social/Family/Support Systems Patient Roles: Spouse Contact Information: [phone] Anticipated Caregiver: Keltin Baird Anticipated Caregiver's Contact Information: [phone] Ability/Limitations of Caregiver: Can provide Min A Caregiver Availability: 24/7 Discharge Plan Discussed with Primary Caregiver: Yes Is Caregiver In Agreement with Plan?: Yes Does Caregiver/Family have Issues with Lodging/Transportation while Pt is in Rehab?: No  Goals Patient/Family Goal for Rehab: PT/OT/SLP Min A Expected length of stay: 21-24 days Pt/Family Agrees to Admission and willing to participate: Yes Program Orientation Provided & Reviewed with Pt/Caregiver Including Roles  & Responsibilities: Yes  Decrease  burden of Care through IP rehab admission: n/a  Possible need for SNF placement upon discharge:not anticipated   Patient Condition: This patient's medical and functional status has changed since the consult dated: 05/12/29 in which the Rehabilitation Physician determined and documented that the patient's condition is appropriate for intensive rehabilitative care in an inpatient rehabilitation facility. See "History of Present Illness" (above) for medical update. Functional changes are: Pt. Now min-mod A with transfers, mobility, and ADLS and taking a PO diet. Patient's medical and functional status update has been discussed with the Rehabilitation physician and patient remains appropriate for inpatient rehabilitation. Will admit to inpatient rehab today.  Preadmission Screen Completed By: Clemens Catholic with updates by  Genella Mech, CCC-SLP, 05/19/2021 11:15 AM ______________________________________________________________________   Discussed status with Dr. Naaman Plummer on 05/19/2021 at 1000 and received approval for admission today.  Admission Coordinator: Clemens Catholic with updates by  Genella Mech, time 1115/Date 6/6/20200

## 2021-05-19 NOTE — Progress Notes (Addendum)
Inpatient Rehab Admissions Coordinator:   I have a CIR bed for this Pt. Today and will plan to move him to CIR today. RN may call report to [phone] after 12pm.   Clemens Catholic, [place], [place] Admissions Coordinator  [phone] (celll) [phone] (office)   .

## 2021-05-19 NOTE — Progress Notes (Signed)
Nutrition Follow-up  DOCUMENTATION CODES:   Not applicable  INTERVENTION:  -d/c Cortrak and TF orders -Magic cup TID with meals, each supplement provides 290 kcal and 9 grams of protein -double protein portions with meals -mvi with minerals daily   NUTRITION DIAGNOSIS:   Increased nutrient needs related to acute illness as evidenced by estimated needs. -- ongoing  GOAL:   Patient will meet greater than or equal to 90% of their needs -- progressing  MONITOR:   Vent status,Skin,TF tolerance,Weight trends,Labs,I & O's  REASON FOR ASSESSMENT:   Ventilator    ASSESSMENT:   Patient with PMH significant for HTN, GERD, colonic polyps, thyroid disease, and OSA. Presents this admission with bacterial meningitis.  5/27 Extubated  Pt's diet advanced to dysphagia 3 with honey thick liquids on 05/16/21. RD consulted to assess adequacy of pt's intake and determine if pt can be weaned from TF. Pt was receiving and tolerating Vital 1.5 @ 3ml/hr, 21ml Prosource TF QID, 249ml free water Q6H. Per pt, his appetite has been pretty good, he just does not like all of the food options. Pt is confident he will be able to continue doing well with meals (and may even do better) once Cortrak is removed. Confirmed with RN that pt's po intake has been between 50-75% despite TF running at continuous rate. Discussed with MD and after discussing plan for pt to discharge to CIR today, decision was made to remove Cortrak. Will place orders for oral nutrition supplements to improve kcal/protein intake.   UOP: 1367ml x24 hours  Medications: SSI Labs reviewed.  CBGs [phone]  Diet Order:   Diet Order            DIET DYS 3 Room service appropriate? Yes; Fluid consistency: Honey Thick  Diet effective now                 EDUCATION NEEDS:   Not appropriate for education at this time  Skin:  Skin Assessment: Reviewed RN Assessment  Last BM:  5/30 large type 7  Height:   Ht Readings from Last 1  Encounters:  05/01/21 5' 10.98" (1.803 m)    Weight:   Wt Readings from Last 1 Encounters:  05/17/21 104.7 kg   BMI:  Body mass index is 32.21 kg/m.  Estimated Nutritional Needs:   Kcal:  2100-2300 kcals  Protein:  115-140  Fluid:  >/= 2 L    Larkin Ina, MS, RD, LDN RD pager number and weekend/on-call pager number located in [place].

## 2021-05-19 NOTE — Evaluation (Signed)
Occupational Therapy Assessment and Plan  Patient Details  Name: Hayden Mcbride MRN: [id number] Date of Birth: 10/07/1961  OT Diagnosis: cognitive deficits and muscle weakness (generalized) Rehab Potential: Rehab Potential (ACUTE ONLY): Excellent ELOS: 7-10 days   Today's Date: 05/20/2021 OT Individual Time: 0800-0900 OT Individual Time Calculation (min): 60 min     Hospital Problem: Principal Problem:   Pneumococcal meningitis   Past Medical History:  Past Medical History:  Diagnosis Date  . Allergy   . Dry eyes   . GERD (gastroesophageal reflux disease)   . Glaucoma   . Hx of adenomatous colonic polyps 07/08/2006   06/2006 - diminutive adenoma 04/19/2015 - diminutive adenoma and one polyp lost - repeat colonoscopy 2021  . Hypertension   . Post-operative nausea and vomiting   . Sleep apnea    wears CPAP  . Thyroid disease    long ago- half thyroid removed ~20 yrs ago    Past Surgical History:  Past Surgical History:  Procedure Laterality Date  . BREATH TEK H PYLORI N/A 12/24/2014   Procedure: BREATH TEK H PYLORI;  Surgeon: Alphonsa Overall, MD;  Location: [facility];  Service: General;  Laterality: N/A;  . CHOLECYSTECTOMY    . COLONOSCOPY    . EYE SURGERY     age 60   . 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 POLYPECTOMY    . SHOULDER SURGERY     left shoulder  . THYROID SURGERY      Assessment & Plan Clinical Impression: Patient is a 60 y.o. year old male with recent admission to the hospital on 04/29/21 with HA and ear pain became combative with slurred speech and intubated 5/18 > extubated 5/27. Found to have bacterial meningitis. PMH: HTN, GERD, OSA on CPAP, thyroid disease, colonic polyps. Patient transferred to [facility] on 05/19/2021 .    Patient currently requires min with basic self-care skills secondary to muscle weakness, decreased cardiorespiratoy endurance and decreased standing balance, decreased postural control and decreased balance strategies.  Prior to  hospitalization, patient could complete BADL with independent .  Patient will benefit from skilled intervention to increase independence with basic self-care skills prior to discharge home with care partner.  Anticipate patient will require 24 hour supervision and follow up home health.  OT - End of Session Endurance Deficit: Yes Endurance Deficit Description: rest breaks within BADL tasks OT Assessment Rehab Potential (ACUTE ONLY): Excellent OT Patient demonstrates impairments in the following area(s): Balance;Cognition;Endurance;Motor;Safety OT Basic ADL's Functional Problem(s): Grooming;Bathing;Dressing;Toileting;Eating OT Transfers Functional Problem(s): Toilet;Tub/Shower OT Additional Impairment(s): None OT Plan OT Intensity: Minimum of 1-2 x/day, 45 to 90 minutes OT Frequency: 5 out of 7 days OT Duration/Estimated Length of Stay: 7-10 days OT Treatment/Interventions: Balance/vestibular training;Community reintegration;Cognitive remediation/compensation;Discharge planning;DME/adaptive equipment instruction;Disease mangement/prevention;Functional electrical stimulation;Neuromuscular re-education;Functional mobility training;Pain management;Patient/family education;Psychosocial support;Self Care/advanced ADL retraining;Skin care/wound managment;Splinting/orthotics;Therapeutic Exercise;Therapeutic Activities;UE/LE Strength taining/ROM;UE/LE Coordination activities;Visual/perceptual remediation/compensation;Wheelchair propulsion/positioning OT Basic Self-Care Anticipated Outcome(s): Mod i/supervision OT Toileting Anticipated Outcome(s): supervision OT Bathroom Transfers Anticipated Outcome(s): Supervision OT Recommendation Recommendations for Other Services: Neuropsych consult Patient destination: Home Follow Up Recommendations: Home health OT;Outpatient OT (HH vs OP) Equipment Recommended: To be determined   OT Evaluation Precautions/Restrictions  Precautions Precautions:  Fall Precaution Comments: generalized weakness, delayed processing Pain  denies pain Home Living/Prior [place] expects to be discharged to:: Private residence Living Arrangements: Spouse/significant other Available Help at Discharge: Family,Available PRN/intermittently Type of Home: House Home Access: Stairs to enter [employer] of Steps:  3 Entrance Stairs-Rails: Right Home Layout: One level Bathroom Shower/Tub: Tub/shower Psychologist, occupational: Standard  Lives With: Spouse IADL History Occupation: Full time employment Type of Occupation: Chief Financial Officer, works from home Prior Function Level of Independence: Independent with homemaking with ambulation,Independent with basic ADLs Comments: Independent Vision Baseline Vision/History: Wears glasses Wears Glasses: At all times Patient Visual Report: No change from baseline Cognition Overall Cognitive Status: Impaired/Different from baseline Arousal/Alertness: Awake/alert Orientation Level: Place;Person;Situation Person: Oriented Place: Oriented Situation: Oriented Year: 2022 Month: June Day of Week: Correct Memory: Impaired Memory Impairment: Decreased recall of new information;Decreased short term memory Immediate Memory Recall: Sock;Blue;Bed Memory Recall Sock: Without Cue Memory Recall Blue: Without Cue Memory Recall Bed: Without Cue Attention: Sustained Sustained Attention: Appears intact Awareness: Impaired Awareness Impairment: Emergent impairment Problem Solving: Impaired Problem Solving Impairment: Functional basic Safety/Judgment: Appears intact Sensation Sensation Light Touch: Appears Intact Coordination Gross Motor Movements are Fluid and Coordinated: No Fine Motor Movements are Fluid and Coordinated: No Coordination and Movement Description: mild decreased smoothness and accuracy Motor  Motor Motor - Skilled Clinical Observations: Generalized weakness   Balance Balance Balance Assessed: Yes Static Sitting Balance Static Sitting - Balance Support: Feet supported Static Sitting - Level of Assistance: 5: Stand by assistance Dynamic Sitting Balance Dynamic Sitting - Balance Support: Feet supported Dynamic Sitting - Level of Assistance: 5: Stand by assistance Static Standing Balance Static Standing - Balance Support: During functional activity Static Standing - Level of Assistance: 4: Min assist Dynamic Standing Balance Dynamic Standing - Balance Support: During functional activity Dynamic Standing - Level of Assistance: 4: Min assist Extremity/Trunk Assessment RUE Assessment RUE Assessment: Within Functional Limits General Strength Comments: OT Individual Time: 1100-1200  OT Individual Time Calculation (min): 60 min LUE Assessment LUE Assessment: Within Functional Limits General Strength Comments: OT Individual Time: 1100-1200  OT Individual Time Calculation (min): 60 min  Care Tool Care Tool Self Care Eating   Eating Assist Level: Minimal Assistance - Patient > 75%    Oral Care    Oral Care Assist Level: Minimal Assistance - Patient > 75%    Bathing   Body parts bathed by patient: Right arm;Left arm;Chest;Abdomen;Front perineal area;Buttocks;Right upper leg;Left upper leg;Face Body parts bathed by helper: Right lower leg;Left lower leg   Assist Level: Minimal Assistance - Patient > 75%    Upper Body Dressing(including orthotics)   What is the patient wearing?: Pull over shirt   Assist Level: Minimal Assistance - Patient > 75%    Lower Body Dressing (excluding footwear)   What is the patient wearing?: Pants Assist for lower body dressing: Minimal Assistance - Patient > 75%    Putting on/Taking off footwear   What is the patient wearing?: Non-skid slipper socks Assist for footwear: Moderate Assistance - Patient 50 - 74%       Care Tool Toileting Toileting activity   Assist for toileting: Minimal Assistance - Patient  > 75%     Care Tool Bed Mobility Roll left and right activity        Sit to lying activity   Sit to lying assist level: Minimal Assistance - Patient > 75%    Lying to sitting edge of bed activity   Lying to sitting edge of bed assist level: Minimal Assistance - Patient > 75%     Care Tool Transfers Sit to stand transfer   Sit to stand assist level: Minimal Assistance - Patient > 75%    Chair/bed transfer   Chair/bed transfer assist level: Minimal  Assistance - Patient > 75%     Toilet transfer   Assist Level: Minimal Assistance - Patient > 75%     Care Tool Cognition Expression of Ideas and Wants Expression of Ideas and Wants: Some difficulty - exhibits some difficulty with expressing needs and ideas (e.g, some words or finishing thoughts) or speech is not clear   Understanding Verbal and Non-Verbal Content Understanding Verbal and Non-Verbal Content: Understands (complex and basic) - clear comprehension without cues or repetitions   Memory/Recall Ability *first 3 days only Memory/Recall Ability *first 3 days only: Current season;Location of own room;That he or she is in a hospital/hospital unit;Staff names and faces    Refer to Care Plan for [place] [facility] OT Short Term Goal 1 (Week 1): LTG=STG 2/2 ELOS  Recommendations for other services: Neuropsych   Skilled Therapeutic Intervention Pt greeted semi-reclined in bed and agreeable to OT eval and treatment focused on self-care retraining. OT eval completed addressing rehab process, OT purpose, POC, ELOS, and goals. Pt completed bed mobility with min A. Sit<>stand and stand-pivot transfer without AD and min A. Pt then ambulated into bathroom to transfer into shower with RW and min A and min cues for technique. Bathing from shower level and dressing sit<>stand from wc-overall min A. See below for further details on BADL performance. Pt left seated in wc with alarm belt on, call bell in reach, and needs  met. ADL ADL Eating: Minimal assistance Grooming: Minimal assistance Upper Body Bathing: Minimal assistance Lower Body Bathing: Minimal assistance Upper Body Dressing: Minimal assistance Lower Body Dressing: Minimal assistance;Moderate assistance Toileting: Minimal assistance Toilet Transfer: Minimal assistance Mobility  Transfers Sit to Stand: Minimal Assistance - Patient > 75% Stand to Sit: Minimal Assistance - Patient > 75%   Discharge Criteria: Patient will be discharged from OT if patient refuses treatment 3 consecutive times without medical reason, if treatment goals not met, if there is a change in medical status, if patient makes no progress towards goals or if patient is discharged from hospital.  The above assessment, treatment plan, treatment alternatives and goals were discussed and mutually agreed upon: by patient  Valma Cava 05/20/2021, 12:58 PM

## 2021-05-19 NOTE — H&P (Signed)
Physical Medicine and Rehabilitation Admission H&P    Chief Complaint  Patient presents with  . Code Stroke  : HPI: Hayden Mcbride. Withem is a 60 year old right-handed male with history of hypertension, OSA, glaucoma, hearing loss, reports of recent visit to [place].  Per chart review patient lives with spouse independent prior to admission.  Works as a Corporate investment banker.   Admitted 04/29/2020 with altered mental status changes speech difficulties lethargy headache right gaze preference and became progressively agitated in the ED requiring intubation for airway protection.  MRI of the brain showed diffuse leptomeningeal enhancement and abnormal FLAIR signal throughout subarachnoid spaces compatible with acute meningitis as well as punctate DWI foci along midline parieto-occipital foci with question of trace purulence.  CTA head and neck was negative for LVO or high-grade stenosis and showed incidental 4.7 cm right thyroid nodule.  LP completed showing marked xanthochromia with elevated opening pressure consistent with encephalitis and was started on acyclovir as well as broad-spectrum antibiotics.  CSF revealed neutrophilic pleocytosis with WBC 585, protein greater than 600 glucose less than 20 and gram stain showing few Streptococcus pneumonia.  Blood cultures x2 negative.  EEG without evidence of seizure.  Antibiotics narrowed to high-dose penicillin per ID input.  Patient continued to have some increased lethargy decrease in movement disconjugate gaze as well as decrease in ability to follow commands.  He was noted to have decreasing movement of left lower extremity.  Neurology recommended repeat CT of the head 05/05/2021 which showed increased density in some of the dural venous sinuses question hemoconcentration versus thrombus.  MRI/MRV brain repeated showing changes consistent with bacterial meningitis and increase in numerous foci of restricted diffusion about subarachnoid spaces with areas of septation  and synechie formation consistent with bacterial meningitis, no evidence of bacterial ventriculitis or venous thrombosis.  He developed fevers with leukocytosis and sputum cultures positive for MRSA.  He was started on linezolid and tolerated extubation to BiPAP 05/09/2021.  CTA chest 5/28 showed consolidation at the posterior base right lower lobe consistent with pneumonia as well as incidental bilateral adrenal masses with recommendations of adrenal MRI for further assessment.  Echocardiogram showed ejection fraction 65 to 70% no valve abnormality.  He did develop A. fib/a flutter felt to be due to stress of underlying Eliquis and was started on amiodarone for rate control.  On cortrak for tube feeds and electrolyte abnormalities treated with D5W as well as potassium/magnesium replacement and diet has been advanced to a dysphagia #3 honey thick liquid.  Therapy evaluations completed due to patient decreased functional ability cognitive deficits was admitted for a comprehensive rehab program.  Review of Systems  Constitutional: Positive for fever.  HENT: Positive for hearing loss.   Eyes: Negative for blurred vision and double vision.  Respiratory: Negative for cough and shortness of breath.   Cardiovascular: Positive for leg swelling. Negative for chest pain and palpitations.  Gastrointestinal: Positive for constipation. Negative for heartburn, nausea and vomiting.  Genitourinary: Positive for frequency.  Musculoskeletal: Positive for myalgias.  Skin: Negative for rash.  Neurological: Positive for weakness.  Psychiatric/Behavioral: Positive for memory loss. The patient has insomnia.   All other systems reviewed and are negative.  Past Medical History:  Diagnosis Date  . Allergy   . Dry eyes   . GERD (gastroesophageal reflux disease)   . Glaucoma   . Hx of adenomatous colonic polyps 07/08/2006   06/2006 - diminutive adenoma 04/19/2015 - diminutive adenoma and one polyp lost - repeat colonoscopy  2021  . Hypertension   . Post-operative nausea and vomiting   . Sleep apnea    wears CPAP  . Thyroid disease    long ago- half thyroid removed ~20 yrs ago    Past Surgical History:  Procedure Laterality Date  . BREATH TEK H PYLORI N/A 12/24/2014   Procedure: BREATH TEK H PYLORI;  Surgeon: Alphonsa Overall, MD;  Location: [facility];  Service: General;  Laterality: N/A;  . CHOLECYSTECTOMY    . COLONOSCOPY    . EYE SURGERY     age 7   . 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 POLYPECTOMY    . SHOULDER SURGERY     left shoulder  . THYROID SURGERY     Family History  Problem Relation Age of Onset  . Diverticulitis Mother   . Colon cancer Father        in his 33's dx'd   . Cancer Maternal Grandmother   . Esophageal cancer Neg Hx   . Stomach cancer Neg Hx   . Rectal cancer Neg Hx   . Colon polyps Neg Hx    Social History:  reports that he quit smoking about 36 years ago. His smoking use included cigarettes. He quit after 10.00 years of use. He has never used smokeless tobacco. He reports current alcohol use of about 2.0 standard drinks of alcohol per week. He reports that he does not use drugs. Allergies:  Allergies  Allergen Reactions  . Demerol [Meperidine]     Large doses cause nausea and vomiting   Medications Prior to Admission  Medication Sig Dispense Refill  . amLODipine (NORVASC) 5 MG tablet Take 5 mg by mouth daily.    Marland Kitchen ibuprofen (ADVIL) 200 MG tablet Take 200-400 mg by mouth every 6 (six) hours as needed for headache or moderate pain.    Marland Kitchen losartan (COZAAR) 100 MG tablet Take 100 mg by mouth daily.    . Multiple Vitamins-Minerals (ONE-A-DAY MENS 50+) TABS Take 1 tablet by mouth daily.    Marland Kitchen omeprazole (PRILOSEC) 20 MG capsule Take 20 mg by mouth daily.       Drug Regimen Review Drug regimen was reviewed and remains appropriate with no significant issues identified  Home: Home Living Family/patient expects to be discharged to:: Private residence Living  Arrangements: Spouse/significant other Available Help at Discharge: Family,Available PRN/intermittently Type of Home: House Home Access: Stairs to enter [employer] of Steps: 3 Entrance Stairs-Rails: Right Home Layout: One level Bathroom Shower/Tub: Tub/shower Psychologist, occupational: Standard Home Equipment: Civil engineer, contracting - built in  Lives With: Spouse   Functional History: Prior Function Level of Independence: Independent Comments: Pt was fully independent.  He works as a Corporate investment banker.  He enjoys playing golf  Functional Status:  Mobility: Bed Mobility Overal bed mobility: Needs Assistance Bed Mobility: Sit to Sidelying Supine to sit: Min guard,HOB elevated Sit to supine: Min guard Sit to sidelying: Min assist General bed mobility comments: Assist to bring legs back up into the bed Transfers Overall transfer level: Needs assistance Equipment used: Rolling walker (2 wheeled) Transfers: Sit to/from [employer] Sit to Stand: Min assist Stand pivot transfers: Min assist Squat pivot transfers: Mod assist General transfer comment: Assist to bring hips up and for balance. Verbal cues for hand placement. Chair to bed with stand pivot using walker Ambulation/Gait Ambulation/Gait assistance: +2 safety/equipment,Min assist Gait Distance (Feet): 6 Feet Assistive device: Rolling walker (2 wheeled) Gait Pattern/deviations: Shuffle,Step-through  pattern,Decreased step length - right,Decreased step length - left General Gait Details: Assist for balance and support. Slow initiation of steps. Followed with chair for safety. Gait velocity: decr Gait velocity interpretation: <1.31 ft/sec, indicative of household ambulator    ADL: ADL Overall ADL's : Needs assistance/impaired Eating/Feeding: NPO Grooming: Wash/dry hands,Wash/dry face,Brushing hair,Set up,Supervision/safety,Sitting Upper Body Bathing: Maximal assistance,Sitting Lower Body Bathing:  Maximal assistance,Sit to/from stand,Sitting/lateral leans Upper Body Dressing : Maximal assistance,Sitting Lower Body Dressing: Maximal assistance,Sit to/from stand Lower Body Dressing Details (indicate cue type and reason): Pt able to don/doff socks with min A while seated EOB.  He was too fagitued to stand up to simulate pulling pants over hips Toilet Transfer: Moderate assistance,Squat-pivot,BSC Toileting- Clothing Manipulation and Hygiene: Total assistance,Sitting/lateral lean,Sit to/from stand Functional mobility during ADLs: Moderate assistance  Cognition: Cognition Overall Cognitive Status: Impaired/Different from baseline Orientation Level: Oriented X4 Cognition Arousal/Alertness: Awake/alert Behavior During Therapy: Flat affect Overall Cognitive Status: Impaired/Different from baseline Area of Impairment: Attention,Following commands,Problem solving Orientation Level: Situation Current Attention Level: Selective Memory: Decreased short-term memory Following Commands: Follows one step commands consistently,Follows multi-step commands inconsistently Safety/Judgement: Decreased awareness of deficits,Decreased awareness of safety Problem Solving: Slow processing,Requires verbal cues,Decreased initiation,Requires tactile cues General Comments: Pt demonstrates delayed processing and requires cues to initiate  Physical Exam: Blood pressure 132/84, pulse 96, temperature 98.5 F (36.9 C), temperature source Oral, resp. rate 15, height 5' 10.98" (1.803 m), weight 104.7 kg, SpO2 100 %. Physical Exam Constitutional:      General: He is not in acute distress.    Appearance: He is obese.  HENT:     Head: Normocephalic and atraumatic.     Right Ear: External ear normal.     Left Ear: External ear normal.     Nose: Nose normal.     Mouth/Throat:     Comments: Thick coating of thrush on tongue Eyes:     Extraocular Movements: Extraocular movements intact.     Pupils: Pupils are  equal, round, and reactive to light.  Cardiovascular:     Rate and Rhythm: Normal rate and regular rhythm.     Heart sounds: No murmur heard. No gallop.   Pulmonary:     Effort: Pulmonary effort is normal. No respiratory distress.     Breath sounds: No wheezing.  Abdominal:     General: Bowel sounds are normal. There is no distension.     Palpations: Abdomen is soft.     Tenderness: There is no abdominal tenderness.  Musculoskeletal:     Cervical back: Normal range of motion.     Right lower leg: Edema present.     Left lower leg: Edema present.  Skin:    General: Skin is warm.     Comments: Dry, flaky skin on both feet. Chronic stasis changes/vasculitic changes in bilateral feet.   Neurological:     Cranial Nerves: No cranial nerve deficit.     Comments: Patient is alert.  No acute distress.  Makes eye contact with examiner.  Dysarthric speech but intelligible.  Oriented to person and place, date, but not time.  Limited medical historian.  Follows simple commands. HOH R>L. Motor 4+/5 BUE, 3/5 prox BLE to 4-/5 distally. Decreased LT in stocking glove distribution in both feet, slightly in finger tips.   Psychiatric:     Comments: Flat, but generally pleasant and appropriate     Results for orders placed or performed during the hospital encounter of 04/29/21 (from the past 48 hour(s))  Glucose, capillary  Status: None   Collection Time: 05/17/21 11:22 AM  Result Value Ref Range   Glucose-Capillary 98 70 - 99 mg/dL    Comment: Glucose reference range applies only to samples taken after fasting for at least 8 hours.  Glucose, capillary     Status: None   Collection Time: 05/17/21  4:08 PM  Result Value Ref Range   Glucose-Capillary 98 70 - 99 mg/dL    Comment: Glucose reference range applies only to samples taken after fasting for at least 8 hours.  Glucose, capillary     Status: Abnormal   Collection Time: 05/17/21  7:55 PM  Result Value Ref Range   Glucose-Capillary 107 (H)  70 - 99 mg/dL    Comment: Glucose reference range applies only to samples taken after fasting for at least 8 hours.  Glucose, capillary     Status: Abnormal   Collection Time: 05/17/21 11:35 PM  Result Value Ref Range   Glucose-Capillary 101 (H) 70 - 99 mg/dL    Comment: Glucose reference range applies only to samples taken after fasting for at least 8 hours.  Comprehensive metabolic panel     Status: Abnormal   Collection Time: 05/18/21  2:21 AM  Result Value Ref Range   Sodium 137 135 - 145 mmol/L   Potassium 4.1 3.5 - 5.1 mmol/L   Chloride 105 98 - 111 mmol/L   CO2 26 22 - 32 mmol/L   Glucose, Bld 132 (H) 70 - 99 mg/dL    Comment: Glucose reference range applies only to samples taken after fasting for at least 8 hours.   BUN 15 8 - 23 mg/dL   Creatinine, Ser 0.68 0.61 - 1.24 mg/dL   Calcium 8.4 (L) 8.9 - 10.3 mg/dL   Total Protein 5.9 (L) 6.5 - 8.1 g/dL   Albumin 2.0 (L) 3.5 - 5.0 g/dL   AST 26 15 - 41 U/L   ALT 82 (H) 0 - 44 U/L   Alkaline Phosphatase 48 38 - 126 U/L   Total Bilirubin 0.7 0.3 - 1.2 mg/dL   GFR, Estimated >60 >60 mL/min    Comment: (NOTE) Calculated using the CKD-EPI Creatinine Equation (2021)    Anion gap 6 5 - 15    Comment: Performed at [facility], [place] [address] [place], [place] [address]  CBC     Status: Abnormal   Collection Time: 05/18/21  2:21 AM  Result Value Ref Range   WBC 13.9 (H) 4.0 - 10.5 K/uL   RBC 4.28 4.22 - 5.81 MIL/uL   Hemoglobin 13.5 13.0 - 17.0 g/dL   HCT 40.7 39.0 - 52.0 %   MCV 95.1 80.0 - 100.0 fL   MCH 31.5 26.0 - 34.0 pg   MCHC 33.2 30.0 - 36.0 g/dL   RDW 13.3 11.5 - 15.5 %   Platelets 279 150 - 400 K/uL   nRBC 0.0 0.0 - 0.2 %    Comment: Performed at [facility], [place] [address] [place], [place] [address]  Glucose, capillary     Status: Abnormal   Collection Time: 05/18/21  4:37 AM  Result Value Ref Range   Glucose-Capillary 114 (H) 70 - 99 mg/dL    Comment: Glucose reference range applies only to  samples taken after fasting for at least 8 hours.  Glucose, capillary     Status: Abnormal   Collection Time: 05/18/21  7:39 AM  Result Value Ref Range   Glucose-Capillary 150 (H) 70 - 99 mg/dL  Comment: Glucose reference range applies only to samples taken after fasting for at least 8 hours.  Glucose, capillary     Status: Abnormal   Collection Time: 05/18/21 10:05 AM  Result Value Ref Range   Glucose-Capillary 106 (H) 70 - 99 mg/dL    Comment: Glucose reference range applies only to samples taken after fasting for at least 8 hours.  Glucose, capillary     Status: Abnormal   Collection Time: 05/18/21 12:12 PM  Result Value Ref Range   Glucose-Capillary 106 (H) 70 - 99 mg/dL    Comment: Glucose reference range applies only to samples taken after fasting for at least 8 hours.  Glucose, capillary     Status: Abnormal   Collection Time: 05/18/21  3:37 PM  Result Value Ref Range   Glucose-Capillary 125 (H) 70 - 99 mg/dL    Comment: Glucose reference range applies only to samples taken after fasting for at least 8 hours.  Glucose, capillary     Status: Abnormal   Collection Time: 05/18/21  8:18 PM  Result Value Ref Range   Glucose-Capillary 112 (H) 70 - 99 mg/dL    Comment: Glucose reference range applies only to samples taken after fasting for at least 8 hours.  Glucose, capillary     Status: Abnormal   Collection Time: 05/18/21 11:13 PM  Result Value Ref Range   Glucose-Capillary 117 (H) 70 - 99 mg/dL    Comment: Glucose reference range applies only to samples taken after fasting for at least 8 hours.  Glucose, capillary     Status: Abnormal   Collection Time: 05/19/21  3:03 AM  Result Value Ref Range   Glucose-Capillary 115 (H) 70 - 99 mg/dL    Comment: Glucose reference range applies only to samples taken after fasting for at least 8 hours.  Comprehensive metabolic panel     Status: Abnormal   Collection Time: 05/19/21  4:23 AM  Result Value Ref Range   Sodium 137 135 - 145  mmol/L   Potassium 4.1 3.5 - 5.1 mmol/L   Chloride 104 98 - 111 mmol/L   CO2 28 22 - 32 mmol/L   Glucose, Bld 120 (H) 70 - 99 mg/dL    Comment: Glucose reference range applies only to samples taken after fasting for at least 8 hours.   BUN 17 8 - 23 mg/dL   Creatinine, Ser 0.65 0.61 - 1.24 mg/dL   Calcium 8.6 (L) 8.9 - 10.3 mg/dL   Total Protein 6.5 6.5 - 8.1 g/dL   Albumin 2.2 (L) 3.5 - 5.0 g/dL   AST 23 15 - 41 U/L   ALT 72 (H) 0 - 44 U/L   Alkaline Phosphatase 52 38 - 126 U/L   Total Bilirubin 0.8 0.3 - 1.2 mg/dL   GFR, Estimated >60 >60 mL/min    Comment: (NOTE) Calculated using the CKD-EPI Creatinine Equation (2021)    Anion gap 5 5 - 15    Comment: Performed at [facility], [place] [address], [place], [place] [address]  CBC     Status: Abnormal   Collection Time: 05/19/21  4:23 AM  Result Value Ref Range   WBC 12.9 (H) 4.0 - 10.5 K/uL   RBC 4.55 4.22 - 5.81 MIL/uL   Hemoglobin 14.3 13.0 - 17.0 g/dL   HCT 43.5 39.0 - 52.0 %   MCV 95.6 80.0 - 100.0 fL   MCH 31.4 26.0 - 34.0 pg   MCHC 32.9 30.0 - 36.0 g/dL  RDW 13.6 11.5 - 15.5 %   Platelets 297 150 - 400 K/uL   nRBC 0.0 0.0 - 0.2 %    Comment: Performed at [facility], [place] [address], [place], [place] [address]  Glucose, capillary     Status: Abnormal   Collection Time: 05/19/21  7:04 AM  Result Value Ref Range   Glucose-Capillary 111 (H) 70 - 99 mg/dL    Comment: Glucose reference range applies only to samples taken after fasting for at least 8 hours.   No results found.     Medical Problem List and Plan: 1.  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  Patient has completed high-dose penicillin followed by infectious disease  -patient may shower  -ELOS/Goals: 21-24 days, min assist for PT, OT, SLP 2.  Antithrombotics: -DVT/anticoagulation: Subcutaneous heparin  -antiplatelet therapy: N/A 3. Pain Management: Voltaren gel 3 times daily as  needed, Tylenol as needed 4. Mood: Provide emotional support  -antipsychotic agents: N/A 5. Neuropsych: This patient is capable of making decisions on his own behalf. 6. Skin/Wound Care: Routine skin checks 7. Fluids/Electrolytes/Nutrition: Routine in and outs with follow-up chemistries 8.  Dysphagia.  Dysphagia #3 honey thick liquids.  Tolerating diet  -NGT removed today 9.  Atrial fibrillation/flutter.  Amiodarone 200 mg twice daily.  Cardiac rate controlled at present 10.  MRSA pneumonia.  Patient completed 7-day course of linezolid as of 05/17/2021 11.  Hypertension.  Cardura 2 mg daily.  Monitor with increased mobility 12. Oral Thrush: diflucan 100mg  po x 5 days 13. HOH: hears best through left ear  -asked him to see if he could have hearing aids brought from home  -able to hear fairly well if provider speaks loudly and clearly   Cathlyn Parsons, PA-C 05/19/2021

## 2021-05-19 NOTE — Progress Notes (Signed)
Patient ID: Hayden Mcbride, male   DOB: 13-Jun-1961, 60 y.o.   MRN: [id number]  Met with patient in patient room, introduces myself and explained my role in his care. Explain some of the rehab process, rehab schedule, and explained that his nurse would continue to explain the rest and answer any further questions. I showed him the menu used on the unit as well as the channel schedule. I gave him additional educational handouts on HTN, DASH diet, Cooking with less salt, Heart Healthy eating, Sleep Apnea, and CPAP, and BiPAP. I explained who his MD and PA were and when they would see him and how [employer] works and when it is scheduled. I will continue to follow his progress.  Dorthula Nettles, RN3, BSN, CBIS, [place], [facility], Inpatient Rehabilitation Office [phone] Cell [phone]

## 2021-05-20 LAB — COMPREHENSIVE METABOLIC PANEL
ALT: 63 U/L — ABNORMAL HIGH (ref 0–44)
AST: 22 U/L (ref 15–41)
Albumin: 2.3 g/dL — ABNORMAL LOW (ref 3.5–5.0)
Alkaline Phosphatase: 51 U/L (ref 38–126)
Anion gap: 7 (ref 5–15)
BUN: 19 mg/dL (ref 8–23)
CO2: 28 mmol/L (ref 22–32)
Calcium: 8.6 mg/dL — ABNORMAL LOW (ref 8.9–10.3)
Chloride: 101 mmol/L (ref 98–111)
Creatinine, Ser: 0.81 mg/dL (ref 0.61–1.24)
GFR, Estimated: >60 mL/min (ref >60)
Glucose, Bld: 104 mg/dL — ABNORMAL HIGH (ref 70–99)
Potassium: 4.2 mmol/L (ref 3.5–5.1)
Sodium: 136 mmol/L (ref 135–145)
Total Bilirubin: 0.9 mg/dL (ref 0.3–1.2)
Total Protein: 6.2 g/dL — ABNORMAL LOW (ref 6.5–8.1)

## 2021-05-20 LAB — GLUCOSE, CAPILLARY
Glucose-Capillary: 116 mg/dL — ABNORMAL HIGH (ref 70–99)
Glucose-Capillary: 146 mg/dL — ABNORMAL HIGH (ref 70–99)
Glucose-Capillary: 128 mg/dL — ABNORMAL HIGH (ref 70–99)
Glucose-Capillary: 122 mg/dL — ABNORMAL HIGH (ref 70–99)
Glucose-Capillary: 119 mg/dL — ABNORMAL HIGH (ref 70–99)

## 2021-05-20 LAB — CBC WITH DIFFERENTIAL/PLATELET
Abs Immature Granulocytes: 0.13 10*3/uL — ABNORMAL HIGH (ref 0.00–0.07)
Basophils Absolute: 0.1 10*3/uL (ref 0.0–0.1)
Basophils Relative: 0 %
Eosinophils Absolute: 0.2 10*3/uL (ref 0.0–0.5)
Eosinophils Relative: 1 %
HCT: 42.2 % (ref 39.0–52.0)
Hemoglobin: 14 g/dL (ref 13.0–17.0)
Immature Granulocytes: 1 %
Lymphocytes Relative: 9 %
Lymphs Abs: 1.2 10*3/uL (ref 0.7–4.0)
MCH: 31.7 pg (ref 26.0–34.0)
MCHC: 33.2 g/dL (ref 30.0–36.0)
MCV: 95.5 fL (ref 80.0–100.0)
Monocytes Absolute: 0.6 10*3/uL (ref 0.1–1.0)
Monocytes Relative: 4 %
Neutro Abs: 11.1 10*3/uL — ABNORMAL HIGH (ref 1.7–7.7)
Neutrophils Relative %: 85 %
Platelets: 285 10*3/uL (ref 150–400)
RBC: 4.42 MIL/uL (ref 4.22–5.81)
RDW: 13.7 % (ref 11.5–15.5)
WBC: 13.3 10*3/uL — ABNORMAL HIGH (ref 4.0–10.5)
nRBC: 0 % (ref 0.0–0.2)

## 2021-05-20 MED ORDER — TRAZODONE HCL 50 MG PO TABS
50.0000 mg | ORAL_TABLET | Freq: Every day | ORAL | Status: DC
  Administered 2021-05-20 – 2021-05-24 (×5): 50 mg via ORAL
  Filled 2021-05-20 (×5): qty 1

## 2021-05-20 MED ORDER — ENSURE MAX PROTEIN PO LIQD
11.0000 [oz_av] | Freq: Every day | ORAL | Status: DC
  Administered 2021-05-20 – 2021-05-22 (×3): 11 [oz_av] via ORAL

## 2021-05-20 NOTE — Evaluation (Signed)
Physical Therapy Assessment and Plan  Patient Details  Name: Hayden Mcbride MRN: [id number] Date of Birth: [birth year]  PT Diagnosis: Abnormality of gait, Difficulty walking and Muscle weakness Rehab Potential: Good ELOS: 7-10 days   Today's Date: 05/20/2021 PT Individual Time: 1434-1530 PT Individual Time Calculation (min): 56 min    Hospital Problem: Principal Problem:   Pneumococcal meningitis   Past Medical History:  Past Medical History:  Diagnosis Date  . Allergy   . Dry eyes   . GERD (gastroesophageal reflux disease)   . Glaucoma   . Hx of adenomatous colonic polyps 07/08/2006   06/2006 - diminutive adenoma 04/19/2015 - diminutive adenoma and one polyp lost - repeat colonoscopy 2021  . Hypertension   . Post-operative nausea and vomiting   . Sleep apnea    wears CPAP  . Thyroid disease    long ago- half thyroid removed ~20 yrs ago    Past Surgical History:  Past Surgical History:  Procedure Laterality Date  . BREATH TEK H PYLORI N/A 12/24/2014   Procedure: BREATH TEK H PYLORI;  Surgeon: Alphonsa Overall, MD;  Location: [facility];  Service: General;  Laterality: N/A;  . CHOLECYSTECTOMY    . COLONOSCOPY    . EYE SURGERY     age 76   . 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 POLYPECTOMY    . SHOULDER SURGERY     left shoulder  . THYROID SURGERY      Assessment & Plan Clinical Impression: Patient is a 60 year old right-handed male with history of hypertension, OSA, glaucoma, hearing loss, reports of recent visit to [place].  Per chart review patient lives with spouse independent prior to admission.  Works as a Corporate investment banker.   Admitted 04/29/2020 with altered mental status changes speech difficulties lethargy headache right gaze preference and became progressively agitated in the ED requiring intubation for airway protection.  MRI of the brain showed diffuse leptomeningeal enhancement and abnormal FLAIR signal throughout subarachnoid spaces compatible with acute  meningitis as well as punctate DWI foci along midline parieto-occipital foci with question of trace purulence.  CTA head and neck was negative for LVO or high-grade stenosis and showed incidental 4.7 cm right thyroid nodule.  LP completed showing marked xanthochromia with elevated opening pressure consistent with encephalitis and was started on acyclovir as well as broad-spectrum antibiotics.  CSF revealed neutrophilic pleocytosis with WBC 585, protein greater than 600 glucose less than 20 and gram stain showing few Streptococcus pneumonia.  Blood cultures x2 negative.  EEG without evidence of seizure.  Antibiotics narrowed to high-dose penicillin per ID input.  Patient continued to have some increased lethargy decrease in movement disconjugate gaze as well as decrease in ability to follow commands.  He was noted to have decreasing movement of left lower extremity.  Neurology recommended repeat CT of the head 05/05/2021 which showed increased density in some of the dural venous sinuses question hemoconcentration versus thrombus.  MRI/MRV brain repeated showing changes consistent with bacterial meningitis and increase in numerous foci of restricted diffusion about subarachnoid spaces with areas of septation and synechie formation consistent with bacterial meningitis, no evidence of bacterial ventriculitis or venous thrombosis.  He developed fevers with leukocytosis and sputum cultures positive for MRSA.  He was started on linezolid and tolerated extubation to BiPAP 05/09/2021.  CTA chest 5/28 showed consolidation at the posterior base right lower lobe consistent with pneumonia as well as incidental bilateral adrenal masses with recommendations of adrenal  MRI for further assessment.  Echocardiogram showed ejection fraction 65 to 70% no valve abnormality.  He did develop A. fib/a flutter felt to be due to stress of underlying Eliquis and was started on amiodarone for rate control.  On cortrak for tube feeds and  electrolyte abnormalities treated with D5W as well as potassium/magnesium replacement and diet has been advanced to a dysphagia #3 honey thick liquid.  Patient transferred to CIR on 05/19/2021 .   Patient currently requires min with mobility secondary to muscle weakness, decreased cardiorespiratoy endurance, decreased awareness and decreased standing balance and decreased balance strategies.  Prior to hospitalization, patient was independent  with mobility and lived with Spouse in a House home.  Home access is 3Stairs to enter.  Patient will benefit from skilled PT intervention to maximize safe functional mobility, minimize fall risk and decrease caregiver burden for planned discharge home with intermittent assist.  Anticipate patient will benefit from follow up OP at discharge.  PT - End of Session Activity Tolerance: Tolerates 30+ min activity with multiple rests Endurance Deficit: Yes Endurance Deficit Description: rest breaks with mobility tasks PT Assessment Rehab Potential (ACUTE/IP ONLY): Good PT Patient demonstrates impairments in the following area(s): Balance;Endurance;Motor;Pain;Perception;Safety PT Transfers Functional Problem(s): Bed Mobility;Bed to Chair;Car;Furniture PT Locomotion Functional Problem(s): Ambulation;Stairs PT Plan PT Intensity: Minimum of 1-2 x/day ,45 to 90 minutes PT Frequency: 5 out of 7 days PT Duration Estimated Length of Stay: 7-10 days PT Treatment/Interventions: Ambulation/gait training;Community reintegration;DME/adaptive equipment instruction;Neuromuscular re-education;Psychosocial support;Stair training;UE/LE Strength taining/ROM;Balance/vestibular training;Discharge planning;Pain management;Functional electrical stimulation;Skin care/wound management;Therapeutic Activities;UE/LE Coordination activities;Cognitive remediation/compensation;Patient/family education;Splinting/orthotics;Therapeutic Exercise;Functional mobility training;Disease  management/prevention PT Transfers Anticipated Outcome(s): Supervision PT Locomotion Anticipated Outcome(s): Supervision PT Recommendation Follow Up Recommendations: Outpatient PT Patient destination: Home Equipment Recommended: To be determined   PT Evaluation Precautions/Restrictions Precautions Precautions: Fall Precaution Comments: generalized weakness, delayed processing General Chart Reviewed: Yes Family/Caregiver Present: No  Pain   Home Living/Prior Functioning Home Living Available Help at Discharge: Family;Available PRN/intermittently Type of Home: House Home Access: Stairs to enter [employer] of Steps: 3 Entrance Stairs-Rails: Right Home Layout: One level Bathroom Shower/Tub: Tub/shower unit;Walk-in shower Bathroom Toilet: Standard  Lives With: Spouse Prior Function Level of Independence: Independent with homemaking with ambulation;Independent with basic ADLs;Independent with gait  Able to Take Stairs?: Yes Driving: Yes Comments: Independent Vision/Perception  Perception Perception: Within Functional Limits Praxis Praxis: Intact  Cognition Overall Cognitive Status: Impaired/Different from baseline Arousal/Alertness: Awake/alert Orientation Level: Oriented X4 Attention: Sustained Sustained Attention: Appears intact Memory: Impaired Memory Impairment: Decreased recall of new information;Decreased short term memory Immediate Memory Recall: Sock;Blue;Bed Memory Recall Sock: Without Cue Memory Recall Blue: Without Cue Memory Recall Bed: Without Cue Awareness: Impaired Awareness Impairment: Emergent impairment Problem Solving: Impaired Problem Solving Impairment: Functional basic Safety/Judgment: Appears intact Sensation Sensation Light Touch: Appears Intact Coordination Gross Motor Movements are Fluid and Coordinated: No Fine Motor Movements are Fluid and Coordinated: No Coordination and Movement Description: mild decreased smoothness  and accuracy Motor  Motor Motor - Skilled Clinical Observations: Generalized weakness   Trunk/Postural Assessment  Cervical Assessment Cervical Assessment:  (forward head) Thoracic Assessment Thoracic Assessment:  (rounded shoulders) Lumbar Assessment Lumbar Assessment:  (posterior pelvic tilt) Postural Control Postural Control: Within Functional Limits  Balance Balance Balance Assessed: Yes Standardized Balance Assessment Standardized Balance Assessment: Timed Up and Go Test Timed Up and Go Test TUG: Normal TUG (with RW) Normal TUG (seconds): 44 Static Sitting Balance Static Sitting - Balance Support: Feet supported Static Sitting - Level of Assistance: 5: Stand by  assistance Dynamic Sitting Balance Dynamic Sitting - Balance Support: Feet supported Dynamic Sitting - Level of Assistance: 5: Stand by assistance Static Standing Balance Static Standing - Balance Support: During functional activity Static Standing - Level of Assistance: 4: Min assist Dynamic Standing Balance Dynamic Standing - Balance Support: During functional activity Dynamic Standing - Level of Assistance: 4: Min assist Extremity Assessment  RUE Assessment RUE Assessment: Within Functional Limits General Strength Comments: OT Individual Time: 1100-1200  OT Individual Time Calculation (min): 60 min LUE Assessment LUE Assessment: Within Functional Limits General Strength Comments: OT Individual Time: 1100-1200  OT Individual Time Calculation (min): 60 min RLE Assessment RLE Assessment: Within Functional Limits LLE Assessment LLE Assessment: Within Functional Limits General Strength Comments: Dorsiflexion 4/5, otherwise [facility]  Care Tool Care Tool Bed Mobility Roll left and right activity   Roll left and right assist level: Minimal Assistance - Patient > 75%    Sit to lying activity   Sit to lying assist level: Minimal Assistance - Patient > 75%    Lying to sitting edge of bed activity   Lying to  sitting edge of bed assist level: Minimal Assistance - Patient > 75%     Care Tool Transfers Sit to stand transfer   Sit to stand assist level: Minimal Assistance - Patient > 75%    Chair/bed transfer   Chair/bed transfer assist level: Minimal Assistance - Patient > 75%     Toilet transfer   Assist Level: Minimal Assistance - Patient > 75%    Car transfer   Car transfer assist level: Minimal Assistance - Patient > 75%      Care Tool Locomotion Ambulation   Assist level: Minimal Assistance - Patient > 75% Assistive device: Walker-rolling Max distance: 100'  Walk 10 feet activity   Assist level: Minimal Assistance - Patient > 75% Assistive device: Walker-rolling   Walk 50 feet with 2 turns activity   Assist level: Minimal Assistance - Patient > 75% Assistive device: Walker-rolling  Walk 150 feet activity Walk 150 feet activity did not occur: Safety/medical concerns      Walk 10 feet on uneven surfaces activity   Assist level: Minimal Assistance - Patient > 75% Assistive device: Walker-rolling  Stairs   Assist level: Minimal Assistance - Patient > 75% Stairs assistive device: 2 hand rails Max number of stairs: 4  Walk up/down 1 step activity   Walk up/down 1 step (curb) assist level: Minimal Assistance - Patient > 75% Walk up/down 1 step or curb assistive device: 2 hand rails    Walk up/down 4 steps activity Walk up/down 4 steps assist level: Minimal Assistance - Patient > 75% Walk up/down 4 steps assistive device: 2 hand rails  Walk up/down 12 steps activity Walk up/down 12 steps activity did not occur: Safety/medical concerns      Pick up small objects from floor Pick up small object from the floor (from standing position) activity did not occur: Safety/medical concerns      Wheelchair Will patient use wheelchair at discharge?: No          Wheel 50 feet with 2 turns activity      Wheel 150 feet activity        Refer to Care Plan for [place] 1 PT Short Term Goal 1 (Week 1): STGs=LTGs  Recommendations for other services: None   Skilled Therapeutic Intervention  Evaluation completed (see details above and below) with education on PT POC and  goals and individual treatment initiated with focus on balance, transfers, ambulation, and stair training. Pt on BSC at initiation of session with RN present. Handoff to PT. No complaint of pain. Pt performs sit to stand from [facility] with RW and minA. Stand step transfer to [facility] with minA and cues on positioning. WC transport to gym for time management. Pt performs car transfer with RW and minA. Requires rest break prior to additional mobility. Pt completes stair navigation with RW and minA, with cues on increasing proximity to RW for safety. Following rest break, pt performs TUG, as detailed below, with score that indicates pt is at high risk for falls. Pt ambulates x100' with RW and minA, with cues for upright gaze to improve posture and balance, increasing gait speed to decrease risk for falls, and decreasing WB through RW for energy conservation. Pt completes x4 steps with BHRs and minA, and requests to rest due to fatigue and difficulty breathing. Pt O2 sats 98% on room air with HR 84 BPM. WC transport back to room. Left seated with alarm intact and all needs within reach.  Mobility Bed Mobility Bed Mobility: Supine to Sit;Sit to Supine Supine to Sit: Minimal Assistance - Patient > 75% Sit to Supine: Minimal Assistance - Patient > 75% Transfers Transfers: Sit to Stand;Stand to Sit;Stand Pivot Transfers Sit to Stand: Minimal Assistance - Patient > 75% Stand to Sit: Minimal Assistance - Patient > 75% Stand Pivot Transfers: Minimal Assistance - Patient > 75% Stand Pivot Transfer Details: Verbal cues for safe use of DME/AE;Verbal cues for precautions/safety;Verbal cues for sequencing;Tactile cues for posture Transfer (Assistive device): Rolling walker Locomotion   Gait Ambulation: Yes Gait Assistance: Minimal Assistance - Patient > 75% Gait Distance (Feet): 100 Feet Assistive device: Rolling walker Gait Assistance Details: Verbal cues for sequencing;Verbal cues for safe use of DME/AE;Verbal cues for precautions/safety;Tactile cues for posture;Verbal cues for gait pattern;Verbal cues for technique Gait Gait: Yes Gait Pattern: Within Functional Limits Gait Pattern:  (very slow with forward flexed trunk and increased WB through RW) Gait velocity: Decreased Stairs / Additional Locomotion Stairs: Yes Stairs Assistance: Minimal Assistance - Patient > 75% Stair Management Technique: Two rails Number of Stairs: 4 Height of Stairs: 6 Ramp: Minimal Assistance - Patient >75% Curb: Minimal Assistance - Patient >75% Wheelchair Mobility Wheelchair Mobility: No   Discharge Criteria: Patient will be discharged from PT if patient refuses treatment 3 consecutive times without medical reason, if treatment goals not met, if there is a change in medical status, if patient makes no progress towards goals or if patient is discharged from hospital.  The above assessment, treatment plan, treatment alternatives and goals were discussed and mutually agreed upon: by patient  Breck Coons, PT, DPT 05/20/2021, 3:55 PM

## 2021-05-20 NOTE — Plan of Care (Signed)
  Problem: RH Balance Goal: LTG: Patient will maintain dynamic sitting balance (OT) Description: LTG:  Patient will maintain dynamic sitting balance with assistance during activities of daily living (OT) Flowsheets (Taken 05/20/2021 1300) LTG: Pt will maintain dynamic sitting balance during ADLs with: Independent Goal: LTG Patient will maintain dynamic standing with ADLs (OT) Description: LTG:  Patient will maintain dynamic standing balance with assist during activities of daily living (OT)  Flowsheets (Taken 05/20/2021 1300) LTG: Pt will maintain dynamic standing balance during ADLs with: Independent with assistive device   Problem: Sit to Stand Goal: LTG:  Patient will perform sit to stand in prep for activites of daily living with assistance level (OT) Description: LTG:  Patient will perform sit to stand in prep for activites of daily living with assistance level (OT) Flowsheets (Taken 05/20/2021 1300) LTG: PT will perform sit to stand in prep for activites of daily living with assistance level: Independent with assistive device   Problem: RH Grooming Goal: LTG Patient will perform grooming w/assist,cues/equip (OT) Description: LTG: Patient will perform grooming with assist, with/without cues using equipment (OT) Flowsheets (Taken 05/20/2021 1300) LTG: Pt will perform grooming with assistance level of: Independent   Problem: RH Bathing Goal: LTG Patient will bathe all body parts with assist levels (OT) Description: LTG: Patient will bathe all body parts with assist levels (OT) Flowsheets (Taken 05/20/2021 1300) LTG: Pt will perform bathing with assistance level/cueing: Supervision/Verbal cueing   Problem: RH Dressing Goal: LTG Patient will perform upper body dressing (OT) Description: LTG Patient will perform upper body dressing with assist, with/without cues (OT). Flowsheets (Taken 05/20/2021 1300) LTG: Pt will perform upper body dressing with assistance level of: Independent Goal: LTG  Patient will perform lower body dressing w/assist (OT) Description: LTG: Patient will perform lower body dressing with assist, with/without cues in positioning using equipment (OT) Flowsheets (Taken 05/20/2021 1300) LTG: Pt will perform lower body dressing with assistance level of: Supervision/Verbal cueing   Problem: RH Toileting Goal: LTG Patient will perform toileting task (3/3 steps) with assistance level (OT) Description: LTG: Patient will perform toileting task (3/3 steps) with assistance level (OT)  Flowsheets (Taken 05/20/2021 1300) LTG: Pt will perform toileting task (3/3 steps) with assistance level: Supervision/Verbal cueing   Problem: RH Toilet Transfers Goal: LTG Patient will perform toilet transfers w/assist (OT) Description: LTG: Patient will perform toilet transfers with assist, with/without cues using equipment (OT) Flowsheets (Taken 05/20/2021 1300) LTG: Pt will perform toilet transfers with assistance level of: Supervision/Verbal cueing   Problem: RH Tub/Shower Transfers Goal: LTG Patient will perform tub/shower transfers w/assist (OT) Description: LTG: Patient will perform tub/shower transfers with assist, with/without cues using equipment (OT) Flowsheets (Taken 05/20/2021 1300) LTG: Pt will perform tub/shower stall transfers with assistance level of: Supervision/Verbal cueing   Problem: RH Awareness Goal: LTG: Patient will demonstrate awareness during functional activites type of (OT) Description: LTG: Patient will demonstrate awareness during functional activites type of (OT) Flowsheets (Taken 05/20/2021 1300) LTG: Patient will demonstrate awareness during functional activites type of (OT): Supervision

## 2021-05-20 NOTE — Progress Notes (Signed)
Initial Nutrition Assessment  DOCUMENTATION CODES:   Obesity unspecified  INTERVENTION:  Provide Magic cup TID with meals, each supplement provides 290 kcal and 9 grams of protein  Continue Ensure Max po once daily (thickened to honey thick consistency), each supplement provides 150 kcal and 30 grams of protein.   Encourage adequate PO intake.   NUTRITION DIAGNOSIS:   Increased nutrient needs related to  (therapy) as evidenced by estimated needs.  GOAL:   Patient will meet greater than or equal to 90% of their needs  MONITOR:   PO intake,Supplement acceptance,Diet advancement,Skin,Weight trends,Labs,I & O's  REASON FOR ASSESSMENT:   Consult Assessment of nutrition requirement/status  ASSESSMENT:   60 year old male admitted 04/29/2020 with altered mental status changes speech difficulties lethargy headache right gaze preference. MRI of the brain showed diffuse leptomeningeal enhancement and abnormal FLAIR signal throughout subarachnoid spaces compatible with acute meningitis as well as punctate DWI foci along midline parieto-occipital foci with question of trace purulence. LP completed showing marked xanthochromia with elevated opening pressure consistent with encephalitis. MRI/MRV brain repeated showing changes consistent with bacterial meningitis and increase in numerous foci of restricted diffusion about subarachnoid spaces with areas of septation and synechie formation consistent with bacterial meningitis. Pt with functional ability cognitive deficits was admitted for a comprehensive rehab program.   Pt is currently on a dysphagia 3 diet with honey thick liquids. Meal completion has been 75-80%. Cortrak NGT removed this morning. Pt reports good appetite and has been gradually getting used to solid foods now that Cortrak NGT out. Pt currently has Ensure Max ordered to aid in increased nutrition needs. RD to additionally order Magic cup as pt on honey thickened liquids. Pt  encouraged to eat his food at meals and to drink his supplements.     NUTRITION - FOCUSED PHYSICAL EXAM:  Flowsheet Row Most Recent Value  Orbital Region No depletion  Upper Arm Region No depletion  Thoracic and Lumbar Region No depletion  Buccal Region No depletion  Temple Region No depletion  Clavicle Bone Region Mild depletion  Clavicle and Acromion Bone Region Mild depletion  Scapular Bone Region Unable to assess  Dorsal Hand No depletion  Patellar Region Mild depletion  Anterior Thigh Region Mild depletion  Posterior Calf Region Mild depletion  Edema (RD Assessment) None  Hair Reviewed  Eyes Reviewed  Mouth Reviewed  Skin Reviewed  Nails Reviewed     Labs and medications reviewed.   Diet Order:   Diet Order            DIET DYS 3 Room service appropriate? Yes; Fluid consistency: Honey Thick  Diet effective now                 EDUCATION NEEDS:   Not appropriate for education at this time  Skin:  Skin Assessment: Reviewed RN Assessment  Last BM:  6/6  Height:   Ht Readings from Last 1 Encounters:  05/19/21 5\' 10"  (1.778 m)    Weight:   Wt Readings from Last 1 Encounters:  05/20/21 102.6 kg   BMI:  Body mass index is 32.46 kg/m.  Estimated Nutritional Needs:   Kcal:  2100-2300  Protein:  115-125 grams  Fluid:  >/= 2 L/day  Corrin Parker, MS, RD, LDN RD pager number/after hours weekend pager number on Amion.

## 2021-05-20 NOTE — Discharge Instructions (Addendum)
Inpatient Rehab Discharge Instructions  [place] CURRENT Discharge date and time: No discharge date for patient encounter.   Activities/Precautions/ Functional Status: Activity: activity as tolerated Diet:  Wound Care: Routine skin checks Functional status:  ___ No restrictions     ___ Walk up steps independently ___ 24/7 supervision/assistance   ___ Walk up steps with assistance ___ Intermittent supervision/assistance  ___ Bathe/dress independently ___ Walk with walker     _x__ Bathe/dress with assistance ___ Walk Independently    ___ Shower independently ___ Walk with assistance    ___ Shower with assistance ___ No alcohol     ___ Return to work/school ________  COMMUNITY REFERRALS UPON DISCHARGE:    Home Health:   PT    OT    ST                       Agency: [facility]  Phone: [phone] *Please expect follow-up within 2-3 days of discharge. If you do not receive follow-up, be sure to contact the branch directly.*   Medical Equipment/Items Ordered: rolling walker                                                 Agency/Supplier: [employer] [phone]   Special Instructions: No driving smoking or alcohol   My questions have been answered and I understand these instructions. I will adhere to these goals and the provided educational materials after my discharge from the hospital.  Patient/Caregiver Signature _______________________________ Date __________  Clinician Signature _______________________________________ Date __________  Please bring this form and your medication list with you to all your follow-up doctor's appointments.

## 2021-05-20 NOTE — Progress Notes (Signed)
PROGRESS NOTE   Subjective/Complaints: Was restless last night. Said his mind was racing, couldn't get himself settled down. Denied pain.   ROS: Patient denies fever, rash, sore throat, blurred vision, nausea, vomiting, diarrhea, cough, shortness of breath or chest pain, joint or back pain, headache .    Objective:   DG HIP UNILAT WITH PELVIS 2-3 VIEWS LEFT  Result Date: 05/19/2021 CLINICAL DATA:  Left hip pain after a fall.  Initial encounter. EXAM: DG HIP (WITH OR WITHOUT PELVIS) 2-3V LEFT COMPARISON:  None. FINDINGS: There is no evidence of hip fracture or dislocation. There is no evidence of arthropathy or other focal bone abnormality. Contrast in the colon from a barium swallow 05/16/2021 noted. IMPRESSION: Negative exam. Electronically Signed   By: Inge Rise M.D.   On: 05/19/2021 11:58   Recent Labs    05/19/21 0423 05/20/21 0524  WBC 12.9* 13.3*  HGB 14.3 14.0  HCT 43.5 42.2  PLT 297 285   Recent Labs    05/19/21 0423 05/20/21 0524  NA 137 136  K 4.1 4.2  CL 104 101  CO2 28 28  GLUCOSE 120* 104*  BUN 17 19  CREATININE 0.65 0.81  CALCIUM 8.6* 8.6*    Intake/Output Summary (Last 24 hours) at 05/20/2021 1142 Last data filed at 05/20/2021 0010 Gross per 24 hour  Intake 240 ml  Output 400 ml  Net -160 ml        Physical Exam: Vital Signs Blood pressure 132/84, pulse 81, temperature 98 F (36.7 C), temperature source Oral, resp. rate 18, height 5\' 10"  (1.778 m), weight 102.6 kg, SpO2 95 %.  General: Alert and oriented x 3, No apparent distress HEENT: Head is normocephalic, atraumatic, PERRLA, EOMI, sclera anicteric, oral mucosa pink and moist, dentition intact, ext ear canals clear,  Neck: Supple without JVD or lymphadenopathy Heart: Reg rate and rhythm. No murmurs rubs or gallops Chest: CTA bilaterally without wheezes, rales, or rhonchi; no distress Abdomen: Soft, non-tender, non-distended, bowel  sounds positive. Extremities: No clubbing, cyanosis, tr LE edema. Pulses are 2+ Psych: Pt's affect is appropriate. Pt is cooperative Skin: dry feet, chronic vascular changes.  Neuro: Pt is cognitively appropriate with reasonable insight, memory, and awareness. Mild STM deficits Cranial nerves 2-12 are intact. Stocking glove sensory loss in feet. Reflexes are 2+ in all 4's. Fine motor coordination is intact. No tremors. Motor function is grossly 4/5 and 3+ to 4/5 in lower extremities. Musculoskeletal: Full ROM, No pain with AROM or PROM in the neck, trunk, or extremities. Posture appropriate     Assessment/Plan: 1. Functional deficits which require 3+ hours per day of interdisciplinary therapy in a comprehensive inpatient rehab setting.  Physiatrist is providing close team supervision and 24 hour management of active medical problems listed below.  Physiatrist and rehab team continue to assess barriers to discharge/monitor patient progress toward functional and medical goals  Care Tool:  Bathing    Body parts bathed by patient: Right arm,Left arm,Chest,Abdomen,Front perineal area,Buttocks,Right upper leg,Left upper leg,Face   Body parts bathed by helper: Right lower leg,Left lower leg     Bathing assist Assist Level: Minimal Assistance - Patient > 75%  Upper Body Dressing/Undressing Upper body dressing   What is the patient wearing?: Pull over shirt    Upper body assist Assist Level: Minimal Assistance - Patient > 75%    Lower Body Dressing/Undressing Lower body dressing      What is the patient wearing?: Pants     Lower body assist Assist for lower body dressing: Minimal Assistance - Patient > 75%     Toileting Toileting    Toileting assist Assist for toileting: Minimal Assistance - Patient > 75%     Transfers Chair/bed transfer  Transfers assist     Chair/bed transfer assist level: Minimal Assistance - Patient > 75%      Locomotion Ambulation   Ambulation assist              Walk 10 feet activity   Assist           Walk 50 feet activity   Assist           Walk 150 feet activity   Assist           Walk 10 feet on uneven surface  activity   Assist           Wheelchair     Assist               Wheelchair 50 feet with 2 turns activity    Assist            Wheelchair 150 feet activity     Assist          Blood pressure 132/84, pulse 81, temperature 98 F (36.7 C), temperature source Oral, resp. rate 18, height 5\' 10"  (1.778 m), weight 102.6 kg, SpO2 95 %.  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 Patient has completed high-dose penicillin followed by infectious disease -patient may shower -ELOS/Goals: 7-10  Days, mod I to supervision for PT, OT, SLP  -Patient is beginning CIR therapies today including PT, OT, and SLP  2. Antithrombotics: -DVT/anticoagulation:Subcutaneous heparin -antiplatelet therapy: N/A 3. Pain Management:Voltaren gel 3 times daily as needed, Tylenol as needed 4. Mood/insomnia:Provide emotional support  -will schedule trazodone 50mg  qhs at 9pm -antipsychotic agents: N/A 5. Neuropsych: This patientiscapable of making decisions on hisown behalf. 6. Skin/Wound Care:Routine skin checks 7. Fluids/Electrolytes/Nutrition:  I personally reviewed all of the patient's labs today, and lab work is within normal limits except for low albumin, sl elevated ALT   -add protein supplement 8. Dysphagia. Dysphagia #3 honey thick liquids. Tolerating diet -encourage fluids, monitor BUN 9. Atrial fibrillation/flutter. Amiodarone 200 mg twice daily. Cardiac rate controlled at present 10. MRSA pneumonia. Patient completed 7-day course of linezolid as of  05/17/2021  -IS, OOB 11. Hypertension. Cardura 2 mg daily. Monitor with increased mobility 12. Oral Thrush: diflucan 100mg  po x 5 days 13. HOH: hears best through left ear -will have hearing aids brought from home -able to hear fairly well if provider speaks loudly and clearly 14. Leukocytosis: stable at 13k  -no s/s infection    LOS: 1 days A FACE TO FACE EVALUATION WAS PERFORMED  Meredith Staggers 05/20/2021, 11:42 AM

## 2021-05-20 NOTE — Progress Notes (Signed)
Patient had placed himself on CPAP for the night.

## 2021-05-20 NOTE — Patient Care Conference (Signed)
Inpatient RehabilitationTeam Conference and Plan of Care Update Date: 05/20/2021   Time: 10:32 AM    Patient Name: Hayden Mcbride      Medical Record Number: [id number]  Date of Birth: 04-04-1961 Sex: Male         Room/Bed: 4W01C/4W01C-01 Payor Info: Payor: [employer] park [employer] / Plan: [employer] PPO / Product Type: *No Product type* /    Admit Date/Time:  05/19/2021  3:29 PM  Primary Diagnosis:  Pneumococcal meningitis  Hospital Problems: Principal Problem:   Pneumococcal meningitis    Expected Discharge Date: Expected Discharge Date:  (7-10 days)  Team Members Present: Physician leading conference: Dr. Alger Simons Care Coodinator Present: Loralee Pacas, LCSWA;Alisyn Lequire Creig Hines, RN, BSN, CRRN Nurse Present: Dorthula Nettles, RN PT Present: Apolinar Junes, PT OT Present: Cherylynn Ridges, OT SLP Present: Weston Anna, SLP PPS Coordinator present : Ileana Ladd, PT     Current Status/Progress Goal Weekly Team Focus  Bowel/Bladder   Patient is continent of bowel and bladder  Patient will remain continent of bowel and bladder  Will assess qshift and PRN   Swallow/Nutrition/ Hydration   Eval PendingE         ADL's   Min A  supervision/mod I  self-care retraining, activity tolerance, general strengthening, dc planning,   Mobility   Pending evaluation         Communication   Eval Pending         Safety/Cognition/ Behavioral Observations  Eval Pending         Pain   Patient reports no pain at this time  Patient will remain pain free  Will assess qshift and PRN   Skin   Patient's skin is intact  Patient's skin will remain intact  Will assess qshift and PRN     Discharge Planning:  To be assessed. Per EMR, pt to have 24/7 care from his wife. SW will confirm.   Team Discussion: Martin Majestic to [place] and contracted Bacterial Meningitis. Cognitively making nice progress, feeding tube is out and on a PO diet, HOH on the right. Continent B/B, no pain reported, dry, flaky, bruising  to skin. Will ask his wife to bring in hearing aids. Should have 24/7 care at discharge.  Patient on target to meet rehab goals: yes, min assist for ADL's, min assist with RW. PT eval pending. SLP reports short term recall problems, has lots questions about his diagnosis. He requires multiple swallows. Will need to do another MBS before discharge.  *See Care Plan and progress notes for long and short-term goals.   Revisions to Treatment Plan:  Not at this time, showing nice improvement.  Teaching Needs: Family education, medication management, transfer training, gait training, balance training, endurance training, stair training, safety awareness.  Current Barriers to Discharge: Decreased caregiver support, Medical stability, Home enviroment access/layout, Lack of/limited family support, Weight, Medication compliance, Behavior and Nutritional means  Possible Resolutions to Barriers: Continue current medications, offer nutritional support, provide emotional support.     Medical Summary Current Status: bacterial meningitis. PNA, insomnia. dysphagia and tolerating diet so far. cognitively showing nice gains  Barriers to Discharge: Medical stability   Possible Resolutions to [employer] Focus: daily assessment of labs, pt data, nutrition   Continued Need for Acute Rehabilitation Level of Care: The patient requires daily medical management by a physician with specialized training in physical medicine and rehabilitation for the following reasons: Direction of a multidisciplinary physical rehabilitation program to maximize functional independence : Yes Medical management of patient stability for increased  activity during participation in an intensive rehabilitation regime.: Yes Analysis of laboratory values and/or radiology reports with any subsequent need for medication adjustment and/or medical intervention. : Yes   I attest that I was present, lead the team conference, and concur with  the assessment and plan of the team.   Cristi Loron 05/20/2021, 2:44 PM

## 2021-05-20 NOTE — Progress Notes (Signed)
Inpatient Rehabilitation  Patient information reviewed and entered into eRehab system by Huck Ashworth Brionna Romanek, OTR/L.   Information including medical coding, functional ability and quality indicators will be reviewed and updated through discharge.    

## 2021-05-20 NOTE — Evaluation (Signed)
Speech Language Pathology Assessment and Plan  Patient Details  Name: Hayden Mcbride MRN: [id number] Date of Birth: [birth year]  SLP Diagnosis: Dysphagia;Cognitive Impairments  Rehab Potential: Excellent ELOS: 7-10 days    Today's Date: 05/20/2021 SLP Individual Time: 3570-1779 SLP Individual Time Calculation (min): 55 min   Hospital Problem: Principal Problem:   Pneumococcal meningitis  Past Medical History:  Past Medical History:  Diagnosis Date  . Allergy   . Dry eyes   . GERD (gastroesophageal reflux disease)   . Glaucoma   . Hx of adenomatous colonic polyps 07/08/2006   06/2006 - diminutive adenoma 04/19/2015 - diminutive adenoma and one polyp lost - repeat colonoscopy 2021  . Hypertension   . Post-operative nausea and vomiting   . Sleep apnea    wears CPAP  . Thyroid disease    long ago- half thyroid removed ~20 yrs ago    Past Surgical History:  Past Surgical History:  Procedure Laterality Date  . BREATH TEK H PYLORI N/A 12/24/2014   Procedure: BREATH TEK H PYLORI;  Surgeon: Alphonsa Overall, MD;  Location: [facility];  Service: General;  Laterality: N/A;  . CHOLECYSTECTOMY    . COLONOSCOPY    . EYE SURGERY     age 60   . 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 POLYPECTOMY    . SHOULDER SURGERY     left shoulder  . THYROID SURGERY      Assessment / Plan / Recommendation Clinical Impression Patient is a 60 year old right-handed male with history of hypertension, OSA, glaucoma, hearing loss, reports of recent visit to [place]. Admitted 04/29/2020 with altered mental status changes, speech difficulties, lethargy, headache and right gaze preference who became progressively agitated in the ED requiring intubation for airway protection.MRI of the brain showed diffuse leptomeningeal enhancement and abnormal FLAIR signal throughout subarachnoid spaces compatible with acute meningitis as well as punctate DWI foci along midline parieto-occipital foci with question of  trace purulence.  LP completed showing marked xanthochromiawith elevated opening pressure consistent with encephalitis and was started on acyclovir as well as broad-spectrum antibiotics. CSF revealed neutrophilic pleocytosis with WBC 585, protein greater than 600 glucose less than 20 and gram stain showing few Streptococcus pneumonia. Blood cultures x2 negative. EEG without evidence of seizure. Antibiotics narrowed to high-dose penicillin per ID input. Patient continued to have some increased lethargy decrease in movement disconjugate gaze as well as decrease in ability to follow commands. He was noted to have decreasing movement of left lower extremity. Neurology recommended repeat CT of the head 05/05/2021 which showed increased density in some of the dural venous sinuses question hemoconcentration versus thrombus. MRI/MRV brain repeated showing changes consistent with bacterial meningitis and increase in numerous foci of restricted diffusion about subarachnoid spaces with areas of septation and synechieformation consistent with bacterial meningitis, no evidence of bacterial ventriculitis or venous thrombosis. He developed fevers with leukocytosis and sputum cultures positive for MRSA. He was started on linezolidand tolerated extubation to BiPAP 05/09/2021. CTA chest 5/28 showed consolidation at the posterior base right lower lobe consistent with pneumonia as well as incidental bilateral adrenal masses with recommendations of adrenal MRI for further assessment.  He did develop A. fib/a flutter felt to be due to stress of underlying Eliquis and was started on amiodarone for rate control. Therapy evaluations completed due to patient decreased functional ability and cognitive deficits. Patient admitted for a comprehensive rehab program 05/19/21.  Patient presents with mild cognitive impairments characterized by delayed  processing, impaired selective attention, functional problem solving and short-term  recall which impacts is safety and overall function. Advanced trials were not administered today due to patient receiving medications at beginning of evaluation, however, consistent use of multiple swallows and an intermittent wet vocal quality was noted with both pureed, solid and honey-thick consistencies via cup which appears consistent with results from most recent MBS. Patient cleared independently. Recommend patient continue current diet. Patient also presents with a mild hoarse vocal quality, suspect due to prolonged intubation. Patient may benefit from RMT. Patient would benefit from skilled SLP intervention to maximize his cognitive and swallowing function prior to discharge.    Skilled Therapeutic Interventions          Administered a cognitive-linguistic evaluation and BSE, please see above for details.   SLP Assessment  Patient will need skilled Speech Lanaguage Pathology Services during CIR admission    Recommendations  SLP Diet Recommendations: Dysphagia 3 (Mech soft);Honey Liquid Administration via: Cup Medication Administration: Crushed with puree Supervision: Patient able to self feed;Intermittent supervision to cue for compensatory strategies Compensations: Clear throat after each swallow;Multiple dry swallows after each bite/sip Postural Changes and/or Swallow Maneuvers: Seated upright 90 degrees Oral Care Recommendations: Oral care BID Recommendations for Other Services: Neuropsych consult Patient destination: Home Follow up Recommendations: 24 hour supervision/assistance;Outpatient SLP Equipment Recommended: None recommended by SLP    SLP Frequency 3 to 5 out of 7 days   SLP Duration  SLP Intensity  SLP Treatment/Interventions 7-10 days  Minumum of 1-2 x/day, 30 to 90 minutes  Cognitive remediation/compensation;Dysphagia/aspiration precaution training;Internal/external aids;Cueing hierarchy;Environmental controls;Therapeutic Activities;Functional tasks;Patient/family  education    Pain No/Denies Pain  Prior Functioning Type of Home: House  Lives With: Spouse Available Help at Discharge: Family;Available PRN/intermittently  SLP Evaluation Cognition Overall Cognitive Status: Impaired/Different from baseline Arousal/Alertness: Awake/alert Orientation Level: Oriented X4 Attention: Sustained Sustained Attention: Appears intact Memory: Impaired Memory Impairment: Decreased recall of new information;Decreased short term memory Awareness: Impaired Awareness Impairment: Emergent impairment Problem Solving: Impaired Problem Solving Impairment: Functional basic Safety/Judgment: Appears intact  Comprehension Auditory Comprehension Overall Auditory Comprehension: Appears within functional limits for tasks assessed Expression Expression Primary Mode of Expression: Verbal Verbal Expression Overall Verbal Expression: Appears within functional limits for tasks assessed Oral Motor Oral Motor/Sensory Function Overall Oral Motor/Sensory Function: Within functional limits Motor Speech Overall Motor Speech: Impaired Respiration: Within functional limits Phonation: Hoarse;Wet Resonance: Within functional limits Articulation: Within functional limitis Intelligibility: Intelligible Motor Planning: Witnin functional limits  Care Tool Care Tool Cognition Expression of Ideas and Wants Expression of Ideas and Wants: Some difficulty - exhibits some difficulty with expressing needs and ideas (e.g, some words or finishing thoughts) or speech is not clear   Understanding Verbal and Non-Verbal Content Understanding Verbal and Non-Verbal Content: Usually understands - understands most conversations, but misses some part/intent of message. Requires cues at times to understand   Memory/Recall Ability *first 3 days only Memory/Recall Ability *first 3 days only: Current season;Location of own room;That he or she is in a hospital/hospital unit;Staff names and faces      Bedside Swallowing Assessment General Date of Onset: 04/29/21 Previous Swallow Assessment: MBS 6/3: Recommended Dys. 3 textures with honey-thick liquids Diet Prior to this Study: Dysphagia 3 (soft);Honey-thick liquids Temperature Spikes Noted: No Respiratory Status: Room air History of Recent Intubation: Yes Length of Intubations (days): 10 days Date extubated: 05/09/21 Behavior/Cognition: Alert;Cooperative;Pleasant mood Oral Cavity - Dentition: Adequate natural dentition Self-Feeding Abilities: Able to feed self Patient Positioning: Upright in chair/Tumbleform Baseline Vocal  Quality: Hoarse Volitional Cough: Strong Volitional Swallow: Able to elicit  Ice Chips Ice chips: Not tested Thin Liquid Thin Liquid: Not tested Nectar Thick Nectar Thick Liquid: Not tested Honey Thick Honey Thick Liquid: Impaired Presentation: Cup;Self fed Pharyngeal Phase Impairments: Wet Vocal Quality;Multiple swallows Puree Puree: Impaired Presentation: Self Fed;Spoon Pharyngeal Phase Impairments: Wet Vocal Quality;Multiple swallows Solid Solid: Impaired Presentation: Self Fed;Spoon Pharyngeal Phase Impairments: Multiple swallows;Wet Vocal Quality BSE Assessment Risk for Aspiration Impact on safety and function: Mild aspiration risk;Moderate aspiration risk Other Related Risk Factors: History of GERD;Deconditioning;Prolonged intubation  Short Term Goals: Week 1: SLP Short Term Goal 1 (Week 1): STGs=LTGs due to ELOS  Refer to Care Plan for Long Term Goals  Recommendations for other services: Neuropsych  Discharge Criteria: Patient will be discharged from SLP if patient refuses treatment 3 consecutive times without medical reason, if treatment goals not met, if there is a change in medical status, if patient makes no progress towards goals or if patient is discharged from hospital.  The above assessment, treatment plan, treatment alternatives and goals were discussed and mutually agreed  upon: by patient  Leanndra Pember 05/20/2021, 10:18 AM

## 2021-05-20 NOTE — Progress Notes (Signed)
Patient ID: Hayden Mcbride, male   DOB: Nov 26, 1961, 60 y.o.   MRN: [id number]  SW met with pt in room to complete assessment. During discussion, pt reported he has a CPAP machine, and does not like the way this CPAP is making him feel (filled up), and would like to speak with respiratory to discuss the settings. Pt wanted to know if he could bring in his own CPAP machine. SW informed pt that he could as other patients have brought in the past.  SW informed nursing on pt concern.   Loralee Pacas, MSW, [place] Office: [phone] Cell: [phone] Fax: [phone]

## 2021-05-21 LAB — GLUCOSE, CAPILLARY
Glucose-Capillary: 103 mg/dL — ABNORMAL HIGH (ref 70–99)
Glucose-Capillary: 111 mg/dL — ABNORMAL HIGH (ref 70–99)
Glucose-Capillary: 124 mg/dL — ABNORMAL HIGH (ref 70–99)
Glucose-Capillary: 133 mg/dL — ABNORMAL HIGH (ref 70–99)
Glucose-Capillary: 136 mg/dL — ABNORMAL HIGH (ref 70–99)
Glucose-Capillary: 94 mg/dL (ref 70–99)

## 2021-05-21 LAB — CULTURE, FUNGUS WITHOUT SMEAR

## 2021-05-21 MED ORDER — DOXAZOSIN MESYLATE 2 MG PO TABS
2.0000 mg | ORAL_TABLET | Freq: Every day | ORAL | Status: DC
  Administered 2021-05-22 – 2021-05-30 (×9): 2 mg via ORAL
  Filled 2021-05-21 (×9): qty 1

## 2021-05-21 MED ORDER — AMIODARONE HCL 200 MG PO TABS
200.0000 mg | ORAL_TABLET | Freq: Two times a day (BID) | ORAL | Status: DC
  Administered 2021-05-21 – 2021-05-30 (×18): 200 mg via ORAL
  Filled 2021-05-21 (×18): qty 1

## 2021-05-21 NOTE — Progress Notes (Signed)
PROGRESS NOTE   Subjective/Complaints: Had the best night of sleep he's had since being in hospital. Wore cpap. Happy with results of therapy yesterday.   ROS: Patient denies fever, rash, sore throat, blurred vision, nausea, vomiting, diarrhea, cough, shortness of breath or chest pain, joint or back pain, headache, or mood change.   Objective:   DG HIP UNILAT WITH PELVIS 2-3 VIEWS LEFT  Result Date: 05/19/2021 CLINICAL DATA:  Left hip pain after a fall.  Initial encounter. EXAM: DG HIP (WITH OR WITHOUT PELVIS) 2-3V LEFT COMPARISON:  None. FINDINGS: There is no evidence of hip fracture or dislocation. There is no evidence of arthropathy or other focal bone abnormality. Contrast in the colon from a barium swallow 05/16/2021 noted. IMPRESSION: Negative exam. Electronically Signed   By: Inge Rise M.D.   On: 05/19/2021 11:58   Recent Labs    05/19/21 0423 05/20/21 0524  WBC 12.9* 13.3*  HGB 14.3 14.0  HCT 43.5 42.2  PLT 297 285   Recent Labs    05/19/21 0423 05/20/21 0524  NA 137 136  K 4.1 4.2  CL 104 101  CO2 28 28  GLUCOSE 120* 104*  BUN 17 19  CREATININE 0.65 0.81  CALCIUM 8.6* 8.6*    Intake/Output Summary (Last 24 hours) at 05/21/2021 0824 Last data filed at 05/21/2021 0554 Gross per 24 hour  Intake 480 ml  Output 2000 ml  Net -1520 ml        Physical Exam: Vital Signs Blood pressure 126/79, pulse 69, temperature 98.6 F (37 C), resp. rate 18, height 5\' 10"  (1.778 m), weight 102.5 kg, SpO2 95 %.  Constitutional: No distress . Vital signs reviewed.obese HEENT: EOMI, oral membranes moist Neck: supple Cardiovascular: RRR without murmur. No JVD    Respiratory/Chest: CTA Bilaterally without wheezes or rales. Normal effort    GI/Abdomen: BS +, non-tender, non-distended Ext: no clubbing, cyanosis, or edema Psych: pleasant and cooperative, a little flat Skin: dry feet, chronic vascular changes.  Neuro: Pt  is cognitively appropriate with reasonable insight, memory, and awareness. Mild STM deficits Cranial nerves 2-12 are intact. Stocking glove sensory loss in feet remains. Reflexes are 2+ in all 4's. Fine motor coordination is intact. No tremors. Motor function is grossly 4/5 and   4/5 in lower extremities. Good siting balance Musculoskeletal: Full ROM, No pain with AROM or PROM in the neck, trunk, or extremities. Posture appropriate     Assessment/Plan: 1. Functional deficits which require 3+ hours per day of interdisciplinary therapy in a comprehensive inpatient rehab setting.  Physiatrist is providing close team supervision and 24 hour management of active medical problems listed below.  Physiatrist and rehab team continue to assess barriers to discharge/monitor patient progress toward functional and medical goals  Care Tool:  Bathing    Body parts bathed by patient: Right arm,Left arm,Chest,Abdomen,Front perineal area,Buttocks,Right upper leg,Left upper leg,Face   Body parts bathed by helper: Right lower leg,Left lower leg     Bathing assist Assist Level: Minimal Assistance - Patient > 75%     Upper Body Dressing/Undressing Upper body dressing   What is the patient wearing?: Pull over shirt    Upper body  assist Assist Level: Minimal Assistance - Patient > 75%    Lower Body Dressing/Undressing Lower body dressing      What is the patient wearing?: Pants     Lower body assist Assist for lower body dressing: Minimal Assistance - Patient > 75%     Toileting Toileting    Toileting assist Assist for toileting: Minimal Assistance - Patient > 75%     Transfers Chair/bed transfer  Transfers assist     Chair/bed transfer assist level: Minimal Assistance - Patient > 75%     Locomotion Ambulation   Ambulation assist      Assist level: Minimal Assistance - Patient > 75% Assistive device: Walker-rolling Max distance: 100'   Walk 10 feet activity   Assist      Assist level: Minimal Assistance - Patient > 75% Assistive device: Walker-rolling   Walk 50 feet activity   Assist    Assist level: Minimal Assistance - Patient > 75% Assistive device: Walker-rolling    Walk 150 feet activity   Assist Walk 150 feet activity did not occur: Safety/medical concerns         Walk 10 feet on uneven surface  activity   Assist     Assist level: Minimal Assistance - Patient > 75% Assistive device: Aeronautical engineer Will patient use wheelchair at discharge?: No             Wheelchair 50 feet with 2 turns activity    Assist            Wheelchair 150 feet activity     Assist          Blood pressure 126/79, pulse 69, temperature 98.6 F (37 C), resp. rate 18, height 5\' 10"  (1.778 m), weight 102.5 kg, SpO2 95 %.  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 Patient has completed high-dose penicillin followed by infectious disease -patient may shower -ELOS/Goals: 7-10  Days, mod I to supervision for PT, OT, SLP  -Continue CIR therapies including PT, OT, and SLP   2. Antithrombotics: -DVT/anticoagulation:Subcutaneous heparin -antiplatelet therapy: N/A 3. Pain Management:Voltaren gel 3 times daily as needed, Tylenol as needed 4. Mood/insomnia:Provide emotional support  -continue trazodone 50mg  qhs at 9pm -antipsychotic agents: N/A 5. Neuropsych: This patientiscapable of making decisions on hisown behalf. 6. Skin/Wound Care:Routine skin checks 7. Fluids/Electrolytes/Nutrition:  -low albumin   -eating well, added protein supplement 8. Dysphagia. Dysphagia #3 honey thick liquids. Tolerating diet -encourage fluids, monitor BUN  -advance liquids per SLP. I would expect that we could advance him soon 9. Atrial fibrillation/flutter.  Amiodarone 200 mg twice daily. Cardiac rate controlled at present 10. MRSA pneumonia. Patient completed 7-day course of linezolid as of 05/17/2021  -IS, OOB 11. Hypertension. Cardura 2 mg daily. Monitor with increased mobility 12. Oral Thrush: diflucan 100mg  po x 5 days 13. HOH: hears best through left ear -  hearing aids brought from home -able to hear fairly well if provider speaks loudly and clearly 14. Leukocytosis: stable at 13k  -no s/s infection    LOS: 2 days A FACE TO FACE EVALUATION WAS PERFORMED  Meredith Staggers 05/21/2021, 8:24 AM

## 2021-05-21 NOTE — Progress Notes (Signed)
Occupational Therapy Session Note  Patient Details  Name: Hayden Mcbride MRN: [id number] Date of Birth: December 18, 1960  Today's Date: 05/21/2021 OT Individual Time: 3403-7096 OT Individual Time Calculation (min): 42 min    Short Term Goals: Week 1:  OT Short Term Goal 1 (Week 1): LTG=STG 2/2 ELOS  Skilled Therapeutic Interventions/Progress Updates:    Treatment session with focus on self-care retraining, functional mobility, and activity tolerance.  Pt received supine in bed agreeable to shower.  Pt completed bed mobility with CGA and stood from EOB with CGA.  Pt reports "I'm going to need the walker" prior to attempting to ambulate in to bathroom.  Pt ambulated to room shower with CGA with RW, therapist providing min cues for sequencing of safe transfer in to shower.  Pt completed bathing at overall seated position, only standing to wash buttocks.  Pt demonstrating heavy reliance on grab bars during sit > stand and dynamic standing balance.  Pt reports not having grab bars or seat in shower at home.  Pt completed LB dressing at sit > stand level in room shower with CGA when standing to pull shorts over hips. Pt ambulated back to EOB with RW with CGA.  Pt completed UB dressing with setup/supervision.  Pt ambulated to sink with RW with CGA and completed oral care in standing with CGA for standing balance.  Pt ambulated to recliner with RW, therapist providing cues for problem solving narrow space around side of bed.  Pt remained upright in recliner with chair alarm on and all needs in reach.  Therapy Documentation Precautions:  Precautions Precautions: Fall Precaution Comments: generalized weakness, delayed processing Restrictions Weight Bearing Restrictions: No Pain: Pain Assessment Pain Scale: 0-10 Pain Score: 0-No pain   Therapy/Group: Individual Therapy  Simonne Come 05/21/2021, 11:08 AM

## 2021-05-21 NOTE — Progress Notes (Signed)
Occupational Therapy Session Note  Patient Details  Name: TOD ABRAHAMSEN MRN: [id number] Date of Birth: 1961/04/05  Today's Date: 05/21/2021 OT Individual Time: 1500-1530 OT Individual Time Calculation (min): 30 min    Short Term Goals: Week 1:  OT Short Term Goal 1 (Week 1): LTG=STG 2/2 ELOS  Skilled Therapeutic Interventions/Progress Updates:    Pt received in w/c in room and consented to OT tx. Session focused on instruction and training in [place] to increase strength and activity tolerance for improved ADL and functional transfer independence. Pt trained in orange level 2 theraband exercises including shoulder internal/external rotation, tricep extension, and chest press for 3x15 with min cuing for proper technique with good carryover. Pt required min rest breaks due to fatigue. After tx, pt helped back to bed with CGA, with alarm on, all needs met, call light in reach.   Therapy Documentation Precautions:  Precautions Precautions: Fall Precaution Comments: generalized weakness, delayed processing Restrictions Weight Bearing Restrictions: No Vital Signs: Therapy Vitals Temp: 98.5 F (36.9 C) Temp Source: Oral Pulse Rate: 73 Resp: 18 BP: (!) 143/92 Patient Position (if appropriate): Lying Oxygen Therapy SpO2: 97 % O2 Device: Room Air Pain:   none   Therapy/Group: Individual Therapy  Lee-Anne Flicker 05/21/2021, 3:20 PM

## 2021-05-21 NOTE — Progress Notes (Signed)
[facility] Individual Statement of Services  Patient Name:  Hayden Mcbride  Date:  05/21/2021  Welcome to the [facility].  Our goal is to provide you with an individualized program based on your diagnosis and situation, designed to meet your specific needs.  With this comprehensive rehabilitation program, you will be expected to participate in at least 3 hours of rehabilitation therapies Monday-Friday, with modified therapy programming on the weekends.  Your rehabilitation program will include the following services:  Physical Therapy (PT), Occupational Therapy (OT), Speech Therapy (ST), 24 hour per day rehabilitation nursing, Therapeutic Recreaction (TR), Psychology, Neuropsychology, Care Coordinator, Rehabilitation Medicine, Nutrition Services, Pharmacy Services and Other  Weekly team conferences will be held on Tuesdays to discuss your progress.  Your Inpatient Rehabilitation Care Coordinator will talk with you frequently to get your input and to update you on team discussions.  Team conferences with you and your family in attendance may also be held.  Expected length of stay: 7-10 days   Overall anticipated outcome: Supervision  Depending on your progress and recovery, your program may change. Your Inpatient Rehabilitation Care Coordinator will coordinate services and will keep you informed of any changes. Your Inpatient Rehabilitation Care Coordinator's name and contact numbers are listed  below.  The following services may also be recommended but are not provided by the [facility] will be made to provide these services after discharge if needed.  Arrangements include referral to agencies that provide these services.  Your insurance has been verified to be:  [facility]  Your primary doctor  is:  Publishing rights manager  Pertinent information will be shared with your doctor and your insurance company.  Inpatient Rehabilitation Care Coordinator:  Cathleen Corti [phone] or ([phone]  Information discussed with and copy given to patient by: Rana Snare, 05/21/2021, 9:36 AM

## 2021-05-21 NOTE — Progress Notes (Signed)
Inpatient Rehabilitation Care Coordinator Assessment and Plan Patient Details  Name: Hayden Mcbride MRN: [id number] Date of Birth: 05-08-1961  Today's Date: 05/21/2021  Hospital Problems: Principal Problem:   Pneumococcal meningitis  Past Medical History:  Past Medical History:  Diagnosis Date  . Allergy   . Dry eyes   . GERD (gastroesophageal reflux disease)   . Glaucoma   . Hx of adenomatous colonic polyps 07/08/2006   06/2006 - diminutive adenoma 04/19/2015 - diminutive adenoma and one polyp lost - repeat colonoscopy 2021  . Hypertension   . Post-operative nausea and vomiting   . Sleep apnea    wears CPAP  . Thyroid disease    long ago- half thyroid removed ~20 yrs ago    Past Surgical History:  Past Surgical History:  Procedure Laterality Date  . BREATH TEK H PYLORI N/A 12/24/2014   Procedure: BREATH TEK H PYLORI;  Surgeon: Alphonsa Overall, MD;  Location: [facility];  Service: General;  Laterality: N/A;  . CHOLECYSTECTOMY    . COLONOSCOPY    . EYE SURGERY     age 60   . 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 POLYPECTOMY    . SHOULDER SURGERY     left shoulder  . THYROID SURGERY     Social History:  reports that he quit smoking about 36 years ago. His smoking use included cigarettes. He quit after 10.00 years of use. He has never used smokeless tobacco. He reports current alcohol use of about 2.0 standard drinks of alcohol per week. He reports that he does not use drugs.  Family / Support Systems Marital Status: Married How Long?: 38 years Patient Roles: Spouse,Parent Spouse/Significant Other: Sharee Pimple (wife): [phone] Children: Adult twin daughters: Bubba Hales (lives in [place]), anf other dtr moving from [place] to [place], [place]. Other Supports: [employer] reported Anticipated Caregiver: Wife Ability/Limitations of Caregiver: Wife works from home. Caregiver Availability: 24/7 Family Dynamics: Pt lives with wife, and works from home most days.  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 Education: College grad Read: Yes Write: Yes Employment Status: Employed Name of Employer: [employer]) Length of Employment:  (30 years) Return to Work Plans: TBD Public relations account executive Issues: Denies Guardian/Conservator: N/A   Abuse/Neglect Abuse/Neglect Assessment Can Be Completed: Yes Physical Abuse: Denies Verbal Abuse: Denies Sexual Abuse: Denies Exploitation of patient/patient's resources: Denies Self-Neglect: Denies  Emotional Status Pt's affect, behavior and adjustment status: Pt in good spirits at time of visit. Slight perseveration on his inability to recall the events surrounding how he ended up in the hospital. Describes it as a bad nightmare and being in a dream for a while. Recent Psychosocial Issues: Denies any hx Psychiatric History: Denies Substance Abuse History: Pt admits he quit smoking cigarettes for a period of time, and then picked back up. Has been smoking atleast 4-5 per day. Admits that he drinks beer, and the amount varies per day. 1beer to 6pk-12pk per week.  Patient / Family Perceptions, Expectations & Goals Pt/Family understanding of illness & functional limitations: Pt has a general understanding of his care needs Premorbid pt/family roles/activities: Independent pta Anticipated changes in roles/activities/participation: Assistance with ADLs/IADLs Pt/family expectations/goals: Pt foal is to get his legs stronger  [employer]: None Premorbid Home Care/DME Agencies: None Transportation available at discharge: Wife Resource referrals recommended: Neuropsychology  Discharge Planning  Living Arrangements: Spouse/significant other Support Systems: Spouse/significant other,Children Type of Residence: Private residence Insurance Resources:  Multimedia programmer (specify) ([employer]) Financial Resources: Radiographer, therapeutic (Comment) (savings) Financial Screen Referred: No Living Expenses: Mortgage Money Management: Patient,Spouse Does the patient have any problems obtaining your medications?: No Home Management: pt reports he and wife shared home duites Patient/Family Preliminary Plans: Wife to assume roles; pt dtr Bubba Hales assisting with making sure bills are paid. Care Coordinator Barriers to Discharge: Decreased caregiver support,Lack of/limited family support Care Coordinator Anticipated Follow Up Needs: HH/OP Expected length of stay: 7-10 days  Clinical Impression SW met with pt in room to introduce self, explain role, and discuss discharge process. Pt is not a English as a second language teacher. No HCPOA. No DME.   Gatsby Chismar A Calysta Craigo 05/21/2021, 10:45 AM

## 2021-05-21 NOTE — Progress Notes (Signed)
Speech Language Pathology Daily Session Note  Patient Details  Name: HELDER CRISAFULLI MRN: [id number] Date of Birth: 17-Apr-1961  Today's Date: 05/21/2021 SLP Individual Time: 9794-8016 SLP Individual Time Calculation (min): 60 min  Short Term Goals: Week 1: SLP Short Term Goal 1 (Week 1): STGs=LTGs due to ELOS  Skilled Therapeutic Interventions:    Skilled SLP treatment performed with focus on swallowing and cognitive goals. Patient performed oral care at supervision level and benefited from verbal cues to re-direct attention prior to initiation of PO trials with ice chips. Patient consumed single ice chips with consistent throat clear and/or cough during 8/10 trials. Pharyngeal symptoms were not present with half ice chips (less than 2.5cc). Patient also required multiple swallows to clear. Effortful swallow did not appear to reduce s/sx of aspiration. Recommend continuation of Dys 3 diet and honey thick liquids at this time. Continue trials of ice chips with speech therapy only following oral care.  SLP administered Cognistat assessment to further evaluate cognitive functioning. Patient scored WFL in all tested areas, including orientation, attention, short-term recall, constructional ability, reasoning/executive functioning, calculations, and judgement. Despite performance on assessment, patient reports he is currently below his baseline and benefited from additional processing time and demonstrated impaired sustained and alternating attention skills. Patient also endorsed "losing my train of thought" during conversation and forgetfulness. Patient was left in bed with alarm activated and all needs within reach. Recommend continuation of current plan of care.   Pain Pain Assessment Pain Scale: 0-10 Pain Score: 0-No pain  Therapy/Group: Individual Therapy  Patty Sermons 05/21/2021, 3:33 PM

## 2021-05-21 NOTE — Progress Notes (Signed)
Patient ID: RAHUL MALINAK, male   DOB: 1961/11/27, 60 y.o.   MRN: [id number]  SW spoke with patient wife Sharee Pimple [phone]) to provide updates on ELOS 7-10 days. Wife reports concerns about getting him to/from outpatient therapies. SW to provide [facility] list for her review to begin to explore options. Wife did express concerns about his ELOS being shorter than anticipated. SW explained the current updates on [facility], and ELOS are based on his recent evaluations. SW reiterated family education will show progress he has made. Scheduled for Monday (6/13) 8am-12pm. SW informed will updated on d/c recommendations once aware, if not, co-workers will inform since this SW will be out of the office.   SW left [facility] list in room with pt.   Loralee Pacas, MSW, [place] Office: [phone] Cell: [phone] Fax: [phone]

## 2021-05-21 NOTE — Progress Notes (Signed)
Physical Therapy Session Note  Patient Details  Name: Hayden Mcbride MRN: [id number] Date of Birth: [birth year]  Today's Date: 05/21/2021 PT Individual Time: 4944-9675 PT Individual Time Calculation (min): 57 min   Short Term Goals: Week 1:  PT Short Term Goal 1 (Week 1): STGs=LTGs  Skilled Therapeutic Interventions/Progress Updates:     Pt received supine in bed and agrees to therapy. No complaint of pain. Supine to sit with bed features. Pt performs sit to stand with CGA and stand step transfer to [facility] with CGA. WC transport to gym for time management. Pt performs gait activities in parallel bars. Multiple reps of sit to stand in parallel bars with verbal cues on body mechanics. Pt performs forward and backward ambulation with bilateral upper extremity support progressing to single upper extremity support, with CGA. PT cues for upright gaze to improve posture and balance and increasing stride length to decrease risk for falls. Pt performs high stepping activity, with cones placed for pt to step over. Pt completes with bilateral progressing to single upper extremity support. Pt demos occasional lack of stability in L hip due to weakness and pain, but is able to improve control with cueing. PT provides rest breaks and repositioning to manage pain. Pt attempts ambulating without assistive device and requires modA with unplanned WC follow due to unsteadiness and report of increased hip pain. Pt appears almost ataxic with gait pattern when not using RW, though reports good sensation in both legs and feet. Pt then ambulates x100' with RW and CGA, with cues to increase gait speed and stride length to decrease risk for falls. Pt left seated in WC with all needs within reach.  Therapy Documentation Precautions:  Precautions Precautions: Fall Precaution Comments: generalized weakness, delayed processing Restrictions Weight Bearing Restrictions: No    Therapy/Group: Individual Therapy  Breck Coons,  PT, DPT 05/21/2021, 4:16 PM

## 2021-05-21 NOTE — Progress Notes (Signed)
Pt put on his home cpap for tonight.

## 2021-05-22 LAB — GLUCOSE, CAPILLARY
Glucose-Capillary: 101 mg/dL — ABNORMAL HIGH (ref 70–99)
Glucose-Capillary: 131 mg/dL — ABNORMAL HIGH (ref 70–99)
Glucose-Capillary: 123 mg/dL — ABNORMAL HIGH (ref 70–99)
Glucose-Capillary: 102 mg/dL — ABNORMAL HIGH (ref 70–99)

## 2021-05-22 NOTE — Progress Notes (Signed)
Occupational Therapy Session Note  Patient Details  Name: Hayden Mcbride MRN: [id number] Date of Birth: 1961-05-21  Today's Date: 05/22/2021 OT Individual Time: 5638-9373 OT Individual Time Calculation (min): 30 min    Short Term Goals: Week 1:  OT Short Term Goal 1 (Week 1): LTG=STG 2/2 ELOS  Skilled Therapeutic Interventions/Progress Updates:    Pt on therapy mat just finishing with PT.  He reports being weak all over from his illness with therapist completing quick UE assessment.  Noted shoulder strength bilaterally at 3+/5 with slightly less shoulder flexion PROM in the RUE compared to the left.  Had him transition into supine where therapist completed stretching of the left shoulder into flexion, flexion with external rotation, and shoulder horizontal adduction.  He then completed shoulder horizontal abduction exercise in supine for 1 set of 20 reps using the light orange level 1 resistance band.  Attempted completion in sitting but he was unable to hold his arms up while completing task.  Also had him complete 1 set of 15 reps for shoulder flexion bilaterally in supine with therapist stabilizing the band.  Finished exercises in sitting with 1 set of shoulder row bilaterally for 15 reps with emphasis on shoulder/scapular retraction.  Pt was transferred back to the wheelchair and then to the bed in the room at min assist level.  He transitioned to supine with supervision and was left with thickened apple juice per request and with the call button and phone in reach.    Therapy Documentation Precautions:  Precautions Precautions: Fall Precaution Comments: generalized weakness, delayed processing Restrictions Weight Bearing Restrictions: No  Pain: Pain Assessment Pain Scale: Faces Faces Pain Scale: Hurts a little bit Pain Type: Acute pain Pain Location: Shoulder Pain Orientation: Right Pain Descriptors / Indicators: Discomfort Pain Onset: With Activity Pain Intervention(s):  Repositioned ADL: See Care Tool Section for some details of mobility and selfcare   Therapy/Group: Individual Therapy  Alayasia Breeding OTR/L 05/22/2021, 4:49 PM

## 2021-05-22 NOTE — Progress Notes (Addendum)
PROGRESS NOTE   Subjective/Complaints: Reports ongoing numbness in the ulnar half of his left hand since "waking up." Also notices a tingling,cap-like feeling around head. Concerned that it's related to his infection  ROS: Patient denies fever, rash, sore throat, blurred vision, nausea, vomiting, diarrhea, cough, shortness of breath or chest pain, joint or back pain,  or mood change.   Objective:   No results found.  Recent Labs    05/20/21 0524  WBC 13.3*  HGB 14.0  HCT 42.2  PLT 285   Recent Labs    05/20/21 0524  NA 136  K 4.2  CL 101  CO2 28  GLUCOSE 104*  BUN 19  CREATININE 0.81  CALCIUM 8.6*    Intake/Output Summary (Last 24 hours) at 05/22/2021 1005 Last data filed at 05/22/2021 0731 Gross per 24 hour  Intake 840 ml  Output 850 ml  Net -10 ml        Physical Exam: Vital Signs Blood pressure 114/65, pulse 77, temperature 97.9 F (36.6 C), temperature source Oral, resp. rate 18, height 5\' 10"  (1.778 m), weight 102.4 kg, SpO2 98 %.  Constitutional: No distress . Vital signs reviewed. HEENT: EOMI, oral membranes moist Neck: supple Cardiovascular: RRR without murmur. No JVD    Respiratory/Chest: CTA Bilaterally without wheezes or rales. Normal effort    GI/Abdomen: BS +, non-tender, non-distended Ext: no clubbing, cyanosis, or edema Psych: pleasant but flat. Skin: dry feet, chronic vascular changes.  Neuro: Pt is cognitively appropriate with reasonable insight, memory, and awareness. Mild STM deficits Cranial nerves 2-12 are intact. Stocking glove sensory loss in feet remains. Reflexes are 2+ in all 4's. Fine motor coordination is intact. No tremors. Motor function is grossly 4/5 and   4/5 in lower extremities. Good siting balance. +Tinel's left ulnar groove--ulnar sensory changes. No ulnar weakness Musculoskeletal: Full ROM, No pain with AROM or PROM in the neck, trunk, or extremities. Posture  appropriate      Assessment/Plan: 1. Functional deficits which require 3+ hours per day of interdisciplinary therapy in a comprehensive inpatient rehab setting. Physiatrist is providing close team supervision and 24 hour management of active medical problems listed below. Physiatrist and rehab team continue to assess barriers to discharge/monitor patient progress toward functional and medical goals  Care Tool:  Bathing    Body parts bathed by patient: Face   Body parts bathed by helper: Right lower leg, Left lower leg     Bathing assist Assist Level: Set up assist     Upper Body Dressing/Undressing Upper body dressing   What is the patient wearing?: Pull over shirt    Upper body assist Assist Level: Supervision/Verbal cueing    Lower Body Dressing/Undressing Lower body dressing      What is the patient wearing?: Incontinence brief, Pants     Lower body assist Assist for lower body dressing: Contact Guard/Touching assist     Toileting Toileting    Toileting assist Assist for toileting: Minimal Assistance - Patient > 75%     Transfers Chair/bed transfer  Transfers assist     Chair/bed transfer assist level: Contact Guard/Touching assist     Locomotion Ambulation   Ambulation assist  Assist level: Minimal Assistance - Patient > 75% Assistive device: Walker-rolling Max distance: 100'   Walk 10 feet activity   Assist     Assist level: Minimal Assistance - Patient > 75% Assistive device: Walker-rolling   Walk 50 feet activity   Assist    Assist level: Minimal Assistance - Patient > 75% Assistive device: Walker-rolling    Walk 150 feet activity   Assist Walk 150 feet activity did not occur: Safety/medical concerns         Walk 10 feet on uneven surface  activity   Assist     Assist level: Minimal Assistance - Patient > 75% Assistive device: Aeronautical engineer Will patient use wheelchair at  discharge?: No             Wheelchair 50 feet with 2 turns activity    Assist            Wheelchair 150 feet activity     Assist          Blood pressure 114/65, pulse 77, temperature 97.9 F (36.6 C), temperature source Oral, resp. rate 18, height 5\' 10"  (1.778 m), weight 102.4 kg, SpO2 98 %.  Medical Problem List and Plan: 1.  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  Patient has completed high-dose penicillin followed by infectious disease             -patient may shower             -ELOS/Goals: 7-10  Days, mod I to supervision for PT, OT, SLP  -Continue CIR therapies including PT, OT, and SLP  -encouraged pt that his cranial sensations are likely related to his meningitis. Would expect these to continue to improve   2.  Antithrombotics: -DVT/anticoagulation: Subcutaneous heparin             -antiplatelet therapy: N/A 3. Pain Management: Voltaren gel 3 times daily as needed, Tylenol as needed 4. Mood/insomnia: Provide emotional support  -continue trazodone 50mg  qhs at 9pm helpful             -antipsychotic agents: N/A 5. Neuropsych: This patient is capable of making decisions on his own behalf. 6. Skin/Wound Care: Routine skin checks 7. Fluids/Electrolytes/Nutrition:   -low albumin   -eating well,now, added protein supplement  6/9 check bmet tomorrow 8.  Dysphagia.  Dysphagia #3 honey thick liquids.  Tolerating diet             -encourage fluids, monitor BUN  -advance liquids per SLP. I would expect that we could advance him soon 9.  Atrial fibrillation/flutter.  Amiodarone 200 mg twice daily.  Cardiac rate controlled at present 10.  MRSA pneumonia/ID.  Patient completed 7-day course of linezolid as of 05/17/2021  -IS, OOB  -will check with ID on need for f/u prior to d/c or after d/c 11.  Hypertension.  Cardura 2 mg daily.  Monitor with increased mobility 12. Oral Thrush: diflucan 100mg  po x 5  days 13. HOH: hears best through left ear             - hearing aids brought from home             -able to hear fairly well if provider speaks loudly and clearly 14. Leukocytosis: stable at 13k  -no s/s infection--recheck 6/10 15. Left hand/arm numbness  -likely ulnar nerve compression during ICU stay  -encouraged extension of elbow  -will order elbow pad  -consider  outpt NCS    LOS: 3 days A FACE TO FACE EVALUATION WAS PERFORMED  Meredith Staggers 05/22/2021, 10:05 AM

## 2021-05-22 NOTE — Progress Notes (Signed)
Physical Therapy Session Note  Patient Details  Name: Hayden Mcbride MRN: [id number] Date of Birth: 19-May-1961  Today's Date: 05/22/2021 PT Individual Time: 1410-1503 PT Individual Time Calculation (min): 53 min   Short Term Goals: Week 1:  PT Short Term Goal 1 (Week 1): STGs=LTGs  Skilled Therapeutic Interventions/Progress Updates:     Pt received supine in bed and agrees to therapy. No complaint of pain. Supine to sit with supervision and use of bed features. Pt performs stand step transfer to [facility] with CGA. WC transport to gym for time management. Pt performs gait training over treadmill with litegait for bodyweight supported ambulation. Pt able to stand with supervision while PT dons harness. Pt performs following bouts on treadmill:  3:00 at 1.3 mph for 321' with bilateral upper extremity support 3:00 at 1.4 mph for 367' with alternating single upper extremity support 2:00 at 1.4 mph for 250' with no upper extremity support  PT provides mirror for visual feedback and provide multimodal cues for posture and gait mechanics, including taking longer stride lengths. Pt ambulates with slightly forward flexed posture. When manually corrected, pt has difficulty maintaining balance and also demonstrates much shortened stride lengths. Pt transitions to mat table for stretching and strengthening. Pt positioned in prone for hip flexor stretch. PT provides manual overpressure at PSIS and lifts knee for soft tissue lengthening. Pt rolls to supine and performs 1x10 bridges with 10 second hold on final rep, and 1x10 bridges with level 4 theraband around distal thighs for hip abductor recruitment. Pt handed off to OT in gym.   Therapy Documentation Precautions:  Precautions Precautions: Fall Precaution Comments: generalized weakness, delayed processing Restrictions Weight Bearing Restrictions: No   Therapy/Group: Individual Therapy  Breck Coons, PT, DPT 05/22/2021, 4:00 PM

## 2021-05-22 NOTE — IPOC Note (Signed)
Overall Plan of Care [facility]) Patient Details Name: VIHAAN GLOSS MRN: [id number] DOB: 04-03-1961  Admitting Diagnosis: Pneumococcal meningitis  Hospital Problems: Principal Problem:   Pneumococcal meningitis     Functional Problem List: Nursing Endurance, Medication Management, Nutrition, Pain, Safety  PT Balance, Endurance, Motor, Pain, Perception, Safety  OT Balance, Cognition, Endurance, Motor, Safety  SLP Cognition  TR         Basic ADL's: OT Grooming, Bathing, Dressing, Toileting, Eating     Advanced  ADL's: OT       Transfers: PT Bed Mobility, Bed to Chair, Car, Manufacturing systems engineer, Metallurgist: PT Ambulation, Stairs     Additional Impairments: OT None  SLP Swallowing, Social Cognition   Problem Solving, Memory, Attention  TR      Anticipated Outcomes Item Anticipated Outcome  Self Feeding    Swallowing  Mod I   Basic self-care  Mod i/supervision  Conservation officer, nature  n/a  Transfers  Supervision  Locomotion  Supervision  Communication     Cognition  Supervision  Pain  <3  Safety/Judgment  Min assist with no falls   Therapy Plan: PT Intensity: Minimum of 1-2 x/day ,45 to 90 minutes PT Frequency: 5 out of 7 days PT Duration Estimated Length of Stay: 7-10 days OT Intensity: Minimum of 1-2 x/day, 45 to 90 minutes OT Frequency: 5 out of 7 days OT Duration/Estimated Length of Stay: 7-10 days SLP Intensity: Minumum of 1-2 x/day, 30 to 90 minutes SLP Frequency: 3 to 5 out of 7 days SLP Duration/Estimated Length of Stay: 7-10 days   Due to the current state of emergency, patients may not be receiving their 3-hours of Medicare-mandated therapy.   Team Interventions: Nursing Interventions Patient/Family Education, Disease Management/Prevention, Pain Management, Medication Management, Discharge Planning, Psychosocial Support  PT interventions Ambulation/gait training, Community  reintegration, DME/adaptive equipment instruction, Neuromuscular re-education, Psychosocial support, Stair training, UE/LE Strength taining/ROM, Training and development officer, Discharge planning, Pain management, Functional electrical stimulation, Skin care/wound management, Therapeutic Activities, UE/LE Coordination activities, Cognitive remediation/compensation, Patient/family education, Splinting/orthotics, Therapeutic Exercise, Functional mobility training, Disease management/prevention  OT Interventions Training and development officer, Community reintegration, Cognitive remediation/compensation, Discharge planning, DME/adaptive equipment instruction, Disease mangement/prevention, Functional electrical stimulation, Neuromuscular re-education, Functional mobility training, Pain management, Patient/family education, Psychosocial support, Self Care/advanced ADL retraining, Skin care/wound managment, Splinting/orthotics, Therapeutic Exercise, Therapeutic Activities, UE/LE Strength taining/ROM, UE/LE Coordination activities, Visual/perceptual remediation/compensation, Wheelchair propulsion/positioning  SLP Interventions Cognitive remediation/compensation, Dysphagia/aspiration precaution training, Internal/external aids, Cueing hierarchy, Environmental controls, Therapeutic Activities, Functional tasks, Patient/family education  TR Interventions    SW/CM Interventions Discharge Planning, Psychosocial Support, Patient/Family Education   Barriers to Discharge MD  Medical stability  Nursing Decreased caregiver support, Home environment access/layout, Lack of/limited family support, Weight, Medication compliance, Behavior, Nutrition means 1 level home, 3 steps to enter, hand rail on right  PT      OT      SLP      SW Decreased caregiver support, Lack of/limited family support     Team Discharge Planning: Destination: PT-Home ,OT- Home , SLP-Home Projected Follow-up: PT-Outpatient PT, OT-  Home health OT,  Outpatient OT (HH vs OP), SLP-24 hour supervision/assistance, Outpatient SLP Projected Equipment Needs: PT-To be determined, OT- To be determined, SLP-None recommended by SLP Equipment Details: PT- , OT-  Patient/family involved in discharge planning: PT- Patient,  OT-Patient, SLP-Patient  MD ELOS: 7-10 days Medical Rehab Prognosis:  Excellent Assessment: The patient has been admitted for  CIR therapies with the diagnosis of pneumococcal meningitis. The team will be addressing functional mobility, strength, stamina, balance, safety, adaptive techniques and equipment, self-care, bowel and bladder mgt, patient and caregiver education, cognition, community reentry, pain mg. Goals have been set at mod I to supervision with OT, PT, SLP.   Due to the current state of emergency, patients may not be receiving their 3 hours per day of Medicare-mandated therapy.    Meredith Staggers, MD, FAAPMR     See Team Conference Notes for weekly updates to the plan of care

## 2021-05-22 NOTE — Progress Notes (Signed)
Patient had home CPAP on. Patient resting well.

## 2021-05-22 NOTE — Progress Notes (Signed)
Speech Language Pathology Daily Session Note  Patient Details  Name: Hayden Mcbride MRN: [id number] Date of Birth: 12-Apr-1961  Today's Date: 05/22/2021 SLP Individual Time: 1000-1100 SLP Individual Time Calculation (min): 60 min  Short Term Goals: Week 1: SLP Short Term Goal 1 (Week 1): STGs=LTGs due to ELOS  Skilled Therapeutic Interventions:   Skilled SLP treatment performed with focus on cognitive and dysphagia goals. SLP facilitated complex reasoning and problem solving task using medication management worksheet (weekly pillbox & 6+ medications) involving 1. Identification of medication organization errors, 2. Generating appropriate solutions. Patient completed task with 90% accuracy and Min A verbal cues to identify organization errors, and 100% accuracy at IND level for generating solutions of medication and organization related scenarios.  Patient performed oral care independently prior to PO trials of ice chips and thin liquids (water only). Patient consumed single ice chips with throat clear during 1/10 trials with no coughing events and clear vocal quality post swallows. Patient consumed 5cc of thin liquid by cup with effortful swallow with delayed throat clear during 4/5 trials. Required two swallows to effectively clear per patient report. He consumed 10cc x1 with effortful swallow resulting in immediate cough and throat clearing. Patient consistently exhibited safe swallow behaviors (took his time, implemented multiple swallows as needed, no impulsivity noted). Patient appears appropriate to initiate water protocol with ice chips only. Patient was able to independently teach back and return demonstration of safe swallow precautions and strategies (I.e., multiple swallows, effortful swallow, upright positioning, oral care prior to ice chips). Patient was left in wheelchair with alarm activated and needs within reach. Continue with current ST plan of care.   Pain Pain Assessment Pain  Scale: 0-10 Pain Score: 0-No pain Pain Type: Neuropathic pain Pain Location: Arm Pain Orientation: Left Pain Descriptors / Indicators: Numbness Pain Onset: Gradual Patients Stated Pain Goal: 0 Pain Intervention(s): Repositioned Multiple Pain Sites: No  Therapy/Group: Individual Therapy  Patty Sermons 05/22/2021, 10:37 AM

## 2021-05-22 NOTE — Progress Notes (Signed)
Orthopedic Tech Progress Note Patient Details:  Hayden Mcbride 1961/02/17 [id number]  Ordered brace Patient ID: [name], male   DOB: [birth year], 60 y.o.   MRN: [id number]  [name] 05/22/2021, 11:02 AM

## 2021-05-22 NOTE — Progress Notes (Signed)
Occupational Therapy Session Note  Patient Details  Name: Hayden Mcbride MRN: [id number] Date of Birth: 1960/12/17  Today's Date: 05/22/2021 OT Individual Time: 3009-2330 OT Individual Time Calculation (min): 56 min    Short Term Goals: Week 1:  OT Short Term Goal 1 (Week 1): LTG=STG 2/2 ELOS  Skilled Therapeutic Interventions/Progress Updates:   Met pt lying supine in bed, slightly reclined, pt agreed to OT tx.  Treatment session was focused on simulating transfers to bathroom equipment, and improving standing balance and activity tolerance. OTS offered a shower, pt declined and expressed he wanted work on other therapeutic activities. Pt reported neuropathic pain from elbow to pinky, OT provided pt with elbow pad for ulnar pain per MD request.Pt was supervision for lying > sitting EOB utilizing bed rails to bring UB upright. Pt demonstrated bathroom layout and expressed conformability with using tub/shower instead of walk in shower due sliding doors. Pt was CGA for sit > stand transfer to RW. Pt was able to ambulate short distance to sink for grooming with RW.  Pt was set-up for grooming tasks while standing at sink. Pt requested to transport in w/c to rehab apartment. Pt ambulated throughout rehab apartment with RW. OTS instructed on safe sit > stand transfer to shower bench. Pt backed to shower bench slightly in front after cues provided for safety with little carryover. Pt will benefit from further education on bathroom transfers. Pt was brought to ortho gym to complete the 9-hole peg test while seated and showed slight difference from R hand (dominant, 33 seconds) to L hand (39 seconds). Pt tolerated standing for 4 minutes completing a cognitive activity on BITS. Pt had difficulties with remembering the word tall consistently throughout activity. Pt was left sitting in w/c, safety belt on, and all needs met.   Therapy Documentation Precautions:  Precautions Precautions: Fall Precaution  Comments: generalized weakness, delayed processing Restrictions Weight Bearing Restrictions: No   Pain: Pain Assessment Pain Scale: 0-10 Pain Type: Neuropathic pain Pain Location: Arm Pain Orientation: Left Pain Descriptors / Indicators: Numbness Pain Onset: Gradual Patients Stated Pain Goal: 0 Pain Intervention(s): Repositioned Multiple Pain Sites: No  Therapy/Group: Individual Therapy  Malayja Freund 05/22/2021, 9:40 AM

## 2021-05-23 LAB — GLUCOSE, CAPILLARY
Glucose-Capillary: 111 mg/dL — ABNORMAL HIGH (ref 70–99)
Glucose-Capillary: 117 mg/dL — ABNORMAL HIGH (ref 70–99)
Glucose-Capillary: 109 mg/dL — ABNORMAL HIGH (ref 70–99)
Glucose-Capillary: 111 mg/dL — ABNORMAL HIGH (ref 70–99)

## 2021-05-23 LAB — CBC
HCT: 40.8 % (ref 39.0–52.0)
Hemoglobin: 13.6 g/dL (ref 13.0–17.0)
MCH: 31.6 pg (ref 26.0–34.0)
MCHC: 33.3 g/dL (ref 30.0–36.0)
MCV: 94.9 fL (ref 80.0–100.0)
Platelets: 254 10*3/uL (ref 150–400)
RBC: 4.3 MIL/uL (ref 4.22–5.81)
RDW: 14 % (ref 11.5–15.5)
WBC: 7.8 10*3/uL (ref 4.0–10.5)
nRBC: 0 % (ref 0.0–0.2)

## 2021-05-23 LAB — BASIC METABOLIC PANEL
Anion gap: 7 (ref 5–15)
BUN: 12 mg/dL (ref 8–23)
CO2: 25 mmol/L (ref 22–32)
Calcium: 8.6 mg/dL — ABNORMAL LOW (ref 8.9–10.3)
Chloride: 105 mmol/L (ref 98–111)
Creatinine, Ser: 0.71 mg/dL (ref 0.61–1.24)
GFR, Estimated: >60 mL/min (ref >60)
Glucose, Bld: 105 mg/dL — ABNORMAL HIGH (ref 70–99)
Potassium: 4.3 mmol/L (ref 3.5–5.1)
Sodium: 137 mmol/L (ref 135–145)

## 2021-05-23 NOTE — Progress Notes (Signed)
Physical Therapy Session Note  Patient Details  Name: Hayden Mcbride MRN: [id number] Date of Birth: Aug 05, 1961  Today's Date: 05/23/2021 PT Individual Time: 4656-8127 PT Individual Time Calculation (min): 56 min   Short Term Goals: Week 1:  PT Short Term Goal 1 (Week 1): STGs=LTGs  Skilled Therapeutic Interventions/Progress Updates:   Received pt semi-reclined in bed, pt agreeable to PT treatment, and denied any pain during session. Session with emphasis on functional mobility/transfers, generalized strengthening, dynamic standing balance/coordination, gait training, and improved activity tolerance. Pt performed bed mobility with supervision with HOB elevated and use of bedrails x 2 trials throughout session. Pt able to don/doff socks/shoes with set up assist while sitting EOB twice throughout session. Pt ambulated 175ft x 2 trials with RW and CGA to/from dayroom. Pt demonstrated decreased stance time on LLE, flexed trunk with increased BUE support on RW, and decreased bilateral foot clearance. Worked on dynamic standing balance playing cornhole using RUE and CGA without UE support x 2 trials. Pt reported "having no arm strength" and required multiple seated rest breaks due to fatigue and SOB. Pt ambulated 61ft x 2 trials without AD and CGA to/from Nustep and performed BUE/LE strengthening on Nustep at workload 4 for 8.5 minutes for a total of 374 steps for improved cardiovascular endurance. Worked on blocked practice sit<>stands 2x8 reps with BUE support and CGA for balance. Pt unable to perform without using UEs. Concluded session with pt semi-reclined in bed, needs within reach, and bed alarm on. Provided pt with honey thickened ice tea.   Pt requested to be cleared to ambulate short distances in hallway with staff using RW; cleared with primary PT and updated on safety plan.   Therapy Documentation Precautions:  Precautions Precautions: Fall Precaution Comments: generalized weakness, delayed  processing Restrictions Weight Bearing Restrictions: No  Therapy/Group: Individual Therapy Alfonse Alpers PT, DPT   05/23/2021, 7:14 AM

## 2021-05-23 NOTE — Progress Notes (Signed)
Speech Language Pathology Daily Session Note  Patient Details  Name: Hayden Mcbride MRN: [id number] Date of Birth: 02/06/1961  Today's Date: 05/23/2021 SLP Individual Time: 1100-1200 SLP Individual Time Calculation (min): 60 min  Short Term Goals: Week 1: SLP Short Term Goal 1 (Week 1): STGs=LTGs due to ELOS  Skilled Therapeutic Interventions: Skilled SLP treatment performed with focus on dysphagia and cognitive-communication goals. SLP facilitated diaphragmatic breathing through education and demonstration d/t report of "I run out of air when I speak" and report of vocal strain. Patient was able to return demonstration with 80% effectiveness and Min A verbal and tactile cues (hands on stomach). Patient was able to sustain phonation ("ah") for 3 additional seconds and improved vocal quality with implementation of diaphragmatic breathing as compared to baseline breath support. Oral care completed prior to PO trials.   Ice Chips: Tolerated 8/8 occasions with no overt s/sx of aspiration, no evidence of throat clearing, and clear vocal quality.   Thin liquid (water): Consumed 5cc of thin water by cup x7 with effortful swallow and 1-2 additional swallows. Presented with delayed throat clear during 3/7 trials, and immediate cough 2/7 trials. Consumed 7.5cc of thin water by cup x3 with effortful swallow and 1-2 additional swallows. Immediate cough noted during 2/3 occasions. Patient was able to teach back water protocol policy (with ice chips only) and safe swallow precautions with 100% accuracy and no cues.   Transferred back to bed with alarm activated and all needs within reach. Patient appears appropriate for repeat MBSS. Scheduled for 6/13. Continue with current ST POC.   Pain Pain Assessment Pain Scale: 0-10 Pain Score: 0-No pain  Therapy/Group: Individual Therapy  Patty Sermons 05/23/2021, 12:08 PM

## 2021-05-23 NOTE — Progress Notes (Signed)
Occupational Therapy Session Note  Patient Details  Name: Hayden Mcbride MRN: [id number] Date of Birth: 06/07/1961  Today's Date: 05/23/2021 OT Individual Time: 0835-0930 OT Individual Time Calculation (min): 55 min    Short Term Goals: Week 1:  OT Short Term Goal 1 (Week 1): LTG=STG 2/2 ELOS  Skilled Therapeutic Interventions/Progress Updates:    Pt greeted semi-reclined in bed and agreeable to OT treatment session focused on self-care retraining. Pt completed bed mobility with supervision. Verbal cues for hand placement with sit><stand and RW. PT then ambulated into bathroom w/ RW and CGA. Bathing completed from shower bench with min A to wash lower legs and lateral leans to wash buttocks. Pt ambulated out of bathroom in similar fashion and completed dressing tasks seated EOB. Set-up A  to obtain clothing and CGA for balance when standing to pull up pants without AD. Standing grooming tasks at the sink with verbal cues for safe RW position. Pt then ambulated to dayroom w/ RW and cues not to use UE's as much. Pt reported some L hip pain while ambulating. Pt took seated rest break, then practiced ambulating in dayroom without AD and min A. Pt ambulated back to room with RW in similar fashion and left seated in wc with alarm belt on, call bell in reach, and needs met.   Therapy Documentation Precautions:  Precautions Precautions: Fall Precaution Comments: generalized weakness, delayed processing Restrictions Weight Bearing Restrictions: No  Pain:   3/10 L hip pain, rest and repositioned for comfort   Therapy/Group: Individual Therapy   S  05/23/2021, 9:59 AM 

## 2021-05-23 NOTE — Plan of Care (Signed)
  Problem: RH Pre-functional/Other (Specify) Goal: RH LTG SLP (Specify) 1 Description: RH LTG SLP (Specify) 1 Flowsheets (Taken 05/23/2021 1516) LTG: Other SLP (Specify) 1: Patient will be able to return demonstration of diaphragmatic breathing to enhance breath support for speech at the sentence level with supervision.

## 2021-05-23 NOTE — Progress Notes (Signed)
PROGRESS NOTE   Subjective/Complaints: Received elbow pad for LUE. Wore last night. Asked if he could get up with staff to walk if he wanted to  ROS: Patient denies fever, rash, sore throat, blurred vision, nausea, vomiting, diarrhea, cough, shortness of breath or chest pain, joint or back pain, headache, or mood change.     Objective:   No results found.  Recent Labs    05/23/21 0517  WBC 7.8  HGB 13.6  HCT 40.8  PLT 254   Recent Labs    05/23/21 0517  NA 137  K 4.3  CL 105  CO2 25  GLUCOSE 105*  BUN 12  CREATININE 0.71  CALCIUM 8.6*    Intake/Output Summary (Last 24 hours) at 05/23/2021 1113 Last data filed at 05/23/2021 0732 Gross per 24 hour  Intake 760 ml  Output 450 ml  Net 310 ml        Physical Exam: Vital Signs Blood pressure 130/88, pulse 77, temperature 98 F (36.7 C), temperature source Oral, resp. rate 16, height 5\' 10"  (1.778 m), weight 99.6 kg, SpO2 98 %.  Constitutional: No distress . Vital signs reviewed. HEENT: EOMI, oral membranes moist Neck: supple Cardiovascular: RRR without murmur. No JVD    Respiratory/Chest: CTA Bilaterally without wheezes or rales. Normal effort    GI/Abdomen: BS +, non-tender, non-distended Ext: no clubbing, cyanosis, or edema Psych: pleasant but a little flat Skin: dry feet, chronic vascular changes--stable to improved in appearance  Neuro: Pt is cognitively appropriate with reasonable insight, memory, and awareness. Mild STM deficits Cranial nerves 2-12 are intact. Stocking glove sensory loss in feet remains. Reflexes are 2+ in all 4's. Fine motor coordination is intact. No tremors. Motor function is grossly 4/5 and   4/5 in lower extremities. Good siting balance. +Tinel's left ulnar groove--ulnar sensory changes-still present.  No ulnar muscle weakness Musculoskeletal: Full ROM, No pain with AROM or PROM in the neck, trunk, or extremities. Posture  appropriate      Assessment/Plan: 1. Functional deficits which require 3+ hours per day of interdisciplinary therapy in a comprehensive inpatient rehab setting. Physiatrist is providing close team supervision and 24 hour management of active medical problems listed below. Physiatrist and rehab team continue to assess barriers to discharge/monitor patient progress toward functional and medical goals  Care Tool:  Bathing    Body parts bathed by patient: Face   Body parts bathed by helper: Right lower leg, Left lower leg     Bathing assist Assist Level: Set up assist     Upper Body Dressing/Undressing Upper body dressing   What is the patient wearing?: Pull over shirt    Upper body assist Assist Level: Supervision/Verbal cueing    Lower Body Dressing/Undressing Lower body dressing      What is the patient wearing?: Incontinence brief, Pants     Lower body assist Assist for lower body dressing: Contact Guard/Touching assist     Toileting Toileting    Toileting assist Assist for toileting: Set up assist     Transfers Chair/bed transfer  Transfers assist     Chair/bed transfer assist level: Minimal Assistance - Patient > 75%     Locomotion  Ambulation   Ambulation assist      Assist level: Minimal Assistance - Patient > 75% Assistive device: Walker-rolling Max distance: 100'   Walk 10 feet activity   Assist     Assist level: Minimal Assistance - Patient > 75% Assistive device: Walker-rolling   Walk 50 feet activity   Assist    Assist level: Minimal Assistance - Patient > 75% Assistive device: Walker-rolling    Walk 150 feet activity   Assist Walk 150 feet activity did not occur: Safety/medical concerns         Walk 10 feet on uneven surface  activity   Assist     Assist level: Minimal Assistance - Patient > 75% Assistive device: Aeronautical engineer Will patient use wheelchair at discharge?: No              Wheelchair 50 feet with 2 turns activity    Assist            Wheelchair 150 feet activity     Assist          Blood pressure 130/88, pulse 77, temperature 98 F (36.7 C), temperature source Oral, resp. rate 16, height 5\' 10"  (1.778 m), weight 99.6 kg, SpO2 98 %.  Medical Problem List and Plan: 1.  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  Patient has completed high-dose penicillin followed by infectious disease             -patient may shower             -ELOS/Goals: 7-10  Days, mod I to supervision for PT, OT, SLP  -Continue CIR therapies including PT, OT, and SLP  -pt may get up to walk with staff if they have time to do so outside of his therapy sessions   2.  Antithrombotics: -DVT/anticoagulation: Subcutaneous heparin             -antiplatelet therapy: N/A 3. Pain Management: Voltaren gel 3 times daily as needed, Tylenol as needed 4. Mood/insomnia: Provide emotional support  -continue trazodone 50mg  qhs at 9pm helpful             -antipsychotic agents: N/A 5. Neuropsych: This patient is capable of making decisions on his own behalf. 6. Skin/Wound Care: Routine skin checks 7. Fluids/Electrolytes/Nutrition:   -low albumin   -eating well,now, added protein supplement  6/10 I personally reviewed the patient's labs today.  WNL BUN/Cr ok 8.  Dysphagia.  Dysphagia #3 honey thick liquids.  Tolerating diet             -BUN/Cr look fine  -advance liquids per SLP. I would expect that we could advance him soon 9.  Atrial fibrillation/flutter.  Amiodarone 200 mg twice daily.  Cardiac rate controlled at present 10.  MRSA pneumonia/ID.  Patient completed 7-day course of linezolid as of 05/17/2021  -IS, OOB  -will check with ID on need for f/u prior to d/c or after d/c 11.  Hypertension.  Cardura 2 mg daily.  Monitor with increased mobility 12. Oral Thrush: diflucan 100mg  po x 5 days completed-looks  better 13. HOH: hears best through left ear             - hearing aids brought from home             -able to hear fairly well if provider speaks loudly and clearly 14. Leukocytosis: stable at 13k  -no s/s infection--recheck 6/10 15. Left hand/arm numbness  -  likely ulnar nerve compression at elbow during ICU stay  -encouraged extension of elbow  -wearing elbow pad  -consider outpt NCS    LOS: 4 days A FACE TO FACE EVALUATION WAS PERFORMED  Meredith Staggers 05/23/2021, 11:13 AM

## 2021-05-23 NOTE — Progress Notes (Signed)
Occupational Therapy Session Note  Patient Details  Name: Hayden Mcbride MRN: [id number] Date of Birth: 05-28-1961  Today's Date: 05/23/2021 OT Individual Time: 1600-1630 OT Individual Time Calculation (min): 30 min    Short Term Goals: Week 1:  OT Short Term Goal 1 (Week 1): LTG=STG 2/2 ELOS  Skilled Therapeutic Interventions/Progress Updates:    Pt received in room and consented to OT tx. Pt walked with RW to bathroom for toileting with distant SUP. Pt wheeled outside for time mgmt and to get some fresh air. While outside, instructed pt in BUE strengthening HEP to increase strength and activity tolerance for ADLs. Pt instructed in 3#db unilateral elbow flexion, but required B hands to hold onto weight for shoulder press and chest press. All exercises completed for 3x10 with min cuing for proper technique with good carryover. Pt required frequent rest breaks 2/2 fatigue. After tx, pt helped back to bed with close SUP and left with all needs met.    Therapy Documentation Precautions:  Precautions Precautions: Fall Precaution Comments: generalized weakness, delayed processing Restrictions Weight Bearing Restrictions: No  Vital Signs: Therapy Vitals Temp: 98 F (36.7 C) Temp Source: Oral Pulse Rate: 78 Resp: 17 BP: 130/89 Patient Position (if appropriate): Sitting Oxygen Therapy SpO2: 100 % O2 Device: Room Air Pain: none    Therapy/Group: Individual Therapy  Joeann Steppe 05/23/2021, 5:04 PM

## 2021-05-23 NOTE — Progress Notes (Signed)
Patient ID: Hayden Mcbride, male   DOB: 1961-05-21, 60 y.o.   MRN: [id number]  SW spoke with pt wife Hayden Mcbride [phone]) to follow-up about HHA preference. Reports no preference.   SW sent referral to [facility] for HHPT/OT/SLP and waiting on follow-up.   Loralee Pacas, MSW, [place] Office: [phone] Cell: [phone] Fax: [phone]

## 2021-05-24 LAB — GLUCOSE, CAPILLARY
Glucose-Capillary: 111 mg/dL — ABNORMAL HIGH (ref 70–99)
Glucose-Capillary: 107 mg/dL — ABNORMAL HIGH (ref 70–99)
Glucose-Capillary: 123 mg/dL — ABNORMAL HIGH (ref 70–99)
Glucose-Capillary: 105 mg/dL — ABNORMAL HIGH (ref 70–99)

## 2021-05-24 NOTE — Progress Notes (Signed)
PROGRESS NOTE   Subjective/Complaints:   Pt reports feeling good- LB< yesterday- On honey thick liquids Likes the apple juice and tea- that's all.  Pain/sore where hit when fell- on back- tylenol not enough, but only taking 1x/day- will just take more.   Asked him to drink 10 drinks/day.    ROS:  Pt denies SOB, abd pain, CP, N/V/C/D, and vision changes  Objective:   No results found.  Recent Labs    05/23/21 0517  WBC 7.8  HGB 13.6  HCT 40.8  PLT 254   Recent Labs    05/23/21 0517  NA 137  K 4.3  CL 105  CO2 25  GLUCOSE 105*  BUN 12  CREATININE 0.71  CALCIUM 8.6*    Intake/Output Summary (Last 24 hours) at 05/24/2021 1511 Last data filed at 05/24/2021 1306 Gross per 24 hour  Intake 1140 ml  Output 175 ml  Net 965 ml        Physical Exam: Vital Signs Blood pressure 125/82, pulse 70, temperature 98.2 F (36.8 C), temperature source Oral, resp. rate 17, height 5\' 10"  (1.778 m), weight 102.4 kg, SpO2 100 %.    General: awake, alert, appropriate, sitting up drinking thickened liquids/breakfast;  NAD HENT: conjugate gaze; oropharynx moist CV: regular rate; no JVD Pulmonary: CTA B/L; no W/R/R- good air movement- sounds good GI: soft, NT, ND, (+)BS Psychiatric: appropriate, but a little flat Neurological: alert  Skin: dry feet, chronic vascular changes--stable to improved in appearance  Neuro: Pt is cognitively appropriate with reasonable insight, memory, and awareness. Mild STM deficits Cranial nerves 2-12 are intact. Stocking glove sensory loss in feet remains. Reflexes are 2+ in all 4's. Fine motor coordination is intact. No tremors. Motor function is grossly 4/5 and   4/5 in lower extremities. Good siting balance. +Tinel's left ulnar groove--ulnar sensory changes-still present.  No ulnar muscle weakness Musculoskeletal: Full ROM, No pain with AROM or PROM in the neck, trunk, or extremities. Posture  appropriate      Assessment/Plan: 1. Functional deficits which require 3+ hours per day of interdisciplinary therapy in a comprehensive inpatient rehab setting. Physiatrist is providing close team supervision and 24 hour management of active medical problems listed below. Physiatrist and rehab team continue to assess barriers to discharge/monitor patient progress toward functional and medical goals  Care Tool:  Bathing    Body parts bathed by patient: Face   Body parts bathed by helper: Right lower leg, Left lower leg     Bathing assist Assist Level: Set up assist     Upper Body Dressing/Undressing Upper body dressing   What is the patient wearing?: Pull over shirt    Upper body assist Assist Level: Supervision/Verbal cueing    Lower Body Dressing/Undressing Lower body dressing      What is the patient wearing?: Incontinence brief, Pants     Lower body assist Assist for lower body dressing: Contact Guard/Touching assist     Toileting Toileting    Toileting assist Assist for toileting: Set up assist     Transfers Chair/bed transfer  Transfers assist     Chair/bed transfer assist level: Contact Guard/Touching assist  Locomotion Ambulation   Ambulation assist      Assist level: Contact Guard/Touching assist Assistive device: Walker-rolling Max distance: 180   Walk 10 feet activity   Assist     Assist level: Contact Guard/Touching assist Assistive device: Walker-rolling   Walk 50 feet activity   Assist    Assist level: Contact Guard/Touching assist Assistive device: Walker-rolling    Walk 150 feet activity   Assist Walk 150 feet activity did not occur: Safety/medical concerns  Assist level: Contact Guard/Touching assist Assistive device: Walker-rolling    Walk 10 feet on uneven surface  activity   Assist     Assist level: Minimal Assistance - Patient > 75% Assistive device: Aeronautical engineer  Will patient use wheelchair at discharge?: No             Wheelchair 50 feet with 2 turns activity    Assist            Wheelchair 150 feet activity     Assist          Blood pressure 125/82, pulse 70, temperature 98.2 F (36.8 C), temperature source Oral, resp. rate 17, height 5\' 10"  (1.778 m), weight 102.4 kg, SpO2 100 %.  Medical Problem List and Plan: 1.  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  Patient has completed high-dose penicillin followed by infectious disease             -patient may shower             -ELOS/Goals: 7-10  Days, mod I to supervision for PT, OT, SLP  -Continue CIR therapies including PT, OT, and SLP  -pt may get up to walk with staff if they have time to do so outside of his therapy sessions  Con't PT and OT and SLP 2.  Antithrombotics: -DVT/anticoagulation: Subcutaneous heparin             -antiplatelet therapy: N/A 3. Pain Management: Voltaren gel 3 times daily as needed, Tylenol as needed  6/11- asked him to either ask for pain meds/tylenol more, or can upgrade pain meds? 4. Mood/insomnia: Provide emotional support  -continue trazodone 50mg  qhs at 9pm helpful             -antipsychotic agents: N/A 5. Neuropsych: This patient is capable of making decisions on his own behalf. 6. Skin/Wound Care: Routine skin checks 7. Fluids/Electrolytes/Nutrition:   -low albumin   -eating well,now, added protein supplement  6/10 I personally reviewed the patient's labs today.  WNL BUN/Cr ok 6/11- pt's drinking 6-8 juices/day- asked him to drink 8-10.  8.  Dysphagia.  Dysphagia #3 honey thick liquids.  Tolerating diet             -BUN/Cr look fine  -advance liquids per SLP. I would expect that we could advance him soon 9.  Atrial fibrillation/flutter.  Amiodarone 200 mg twice daily.  Cardiac rate controlled at present 10.  MRSA pneumonia/ID.  Patient completed 7-day course of linezolid as  of 05/17/2021  -IS, OOB  -will check with ID on need for f/u prior to d/c or after d/c 11.  Hypertension.  Cardura 2 mg daily.  Monitor with increased mobility  6/11- BP is well controlled- con't regimen 12. Oral Thrush: diflucan 100mg  po x 5 days completed-looks better 13. HOH: hears best through left ear             - hearing aids brought from home             -  able to hear fairly well if provider speaks loudly and clearly 14. Leukocytosis: stable at 13k  -no s/s infection--recheck 6/10 15. Left hand/arm numbness  -likely ulnar nerve compression at elbow during ICU stay  -encouraged extension of elbow  -wearing elbow pad  -consider outpt NCS    LOS: 5 days A FACE TO FACE EVALUATION WAS PERFORMED  Averie Meiner 05/24/2021, 3:11 PM

## 2021-05-24 NOTE — Progress Notes (Signed)
Occupational Therapy Session Note  Patient Details  Name: Hayden Mcbride MRN: [id number] Date of Birth: [birth year]  Today's Date: 05/24/2021 Session 1 OT Individual Time: 0805-0900 OT Individual Time Calculation (min): 55 min   Session 2 OT Individual Time: 6283-6629 OT Individual Time Calculation (min): 40 min    Short Term Goals: Week 1:  OT Short Term Goal 1 (Week 1): LTG=STG 2/2 ELOS  Skilled Therapeutic Interventions/Progress Updates:    Session 1 Pt greeted semi-reclined in bed and agreeable to OT treatment session. Pt reported need to go to the bathroom. Sit<>stand from bed with CGA and verbal cues for hand placement. Pt ambulated into bathroom w/ RW and close supervision. Pt managed clothing and had successful BM and voided bladder. Pt able to complete peri-care and doffed clothing sitting on commode. Bathing from shower bench with supervision/set-up A and lateral leans to wash buttocks. Educated on RW management to safely access dresser drawers and pick out clothing for the day. Discussed transporting items with RW. Dressing EOB set-up/supervision and CGA for balance when pulling up pants. Standing balance/endurance with standing grooming tasks and no overt LOB. Pt then consumed ice chips via Ice Chip protocol.  Pt with occasional throat clear and 3 coughing spells. Pt left seated in wc with alarm belt on, call bell in reach, and needs met.   Session 2 Pt greeted seated in wc and agreeable to OT treatment session. Pt completed bed mobility with supervision and donned socks and shoes with set-up. CGA stand-pivot to wc without AD. Functional ambulation in gym without AD and CGA. Worked on standing balance and trunk rotation standing on foam block and turning L and R to collect matching cards placed behind pt on mat. Pt with less trunk rotation on L side requiring CGA for balance. Trunk and spine stretches seated on mat to facilitate increased trunk rotation.  OT provided pt with UB  there-ex handout and orange theraband. Went through set of 10 exercises Straight arm pulls, straight arm raise, overhead pull down, side arm raise, forearm pull, and bicep curl. Pt with limited shoulder mobility and weakness on both sides but R weaker than L. Pt returned to room and left seated in wc with needs met.   Therapy Documentation Precautions:  Precautions Precautions: Fall Precaution Comments: generalized weakness, delayed processing Restrictions Weight Bearing Restrictions: No Pain: Pain Assessment Pain Scale: 0-10 Pain Score: 0-No pain    Therapy/Group: Individual Therapy  Valma Cava 05/24/2021, 11:04 AM

## 2021-05-24 NOTE — Progress Notes (Signed)
Physical Therapy Session Note  Patient Details  Name: Hayden Mcbride MRN: [id number] Date of Birth: 1961-06-11  Today's Date: 05/24/2021 PT Individual Time: 1110-1210 PT Individual Time Calculation (min): 60 min   Short Term Goals: Week 1:  PT Short Term Goal 1 (Week 1): STGs=LTGs  Skilled Therapeutic Interventions/Progress Updates: Pt seated in wc.  He denied pain.    Transfer training to low recliner in ADL room. Sit>< stand with cues for forward wt shift and placement of feet, x 4 with CGA; LOB backwards x 1.   Reviewed diaphragmatic breathing with fair carry over.  Gait training on carpet and level tile x 180', RW.  Pt has very narrow BOS and scissoring during turns.  neuromuscular re-education via multimodal cues and demo for: standing with bil UEs- 12 x 1 R/L hip abduction, heel raises, toe raises.  Pt had no visible L ankle DF. In sitting: 15 x 1 bil scapular retraction.   Balance challenge standing with feet on red wedge on floor, with bil UE support>0UE support, x 1 minute.  Pt demonstrated ankle strategies bil; he was exhausted after 1 minute.  Seated rest breaks required throughout session due to SOB; cues for slowing breathing were effective.    At end of session, pt seated in wc with needs at hand and seat belt alarm set.     Therapy Documentation Precautions:  Precautions Precautions: Fall Precaution Comments: generalized weakness, delayed processing Restrictions Weight Bearing Restrictions: No       Therapy/Group: Individual Therapy  Trishelle Devora 05/24/2021, 12:22 PM

## 2021-05-24 NOTE — Progress Notes (Signed)
Speech Language Pathology Daily Session Note  Patient Details  Name: Hayden Mcbride MRN: [id number] Date of Birth: 1961-03-09  Today's Date: 05/24/2021 SLP Individual Time: 1255-1345 SLP Individual Time Calculation (min): 50 min  Short Term Goals: Week 1: SLP Short Term Goal 1 (Week 1): STGs=LTGs due to ELOS  Skilled Therapeutic Interventions: Skilled treatment session focused on dysphagia and cognitive goals. Upon arrival, patient requested to use the bathroom. Patient ambulated to the bathroom and was continent of bladder. Patient also performed basic self-care tasks at the sink with Mod I. Patient consumed trials of ice chips with intermittent throat clear but with patient reports of swallowing feeling "better." Recommend patient continue ice chip trials with repeat MBS scheduled for Monday. SLP also facilitated session with a complex calendar/appointment task that patient completed with Mod I for problem solving and attention. Patient returned to bed at end of session and left with alarm on and all needs within reach. Continue with current plan of care.      Pain No/Denies Pain   Therapy/Group: Individual Therapy  Per Beagley 05/24/2021, 2:29 PM

## 2021-05-25 LAB — GLUCOSE, CAPILLARY
Glucose-Capillary: 110 mg/dL — ABNORMAL HIGH (ref 70–99)
Glucose-Capillary: 132 mg/dL — ABNORMAL HIGH (ref 70–99)
Glucose-Capillary: 119 mg/dL — ABNORMAL HIGH (ref 70–99)
Glucose-Capillary: 104 mg/dL — ABNORMAL HIGH (ref 70–99)

## 2021-05-25 MED ORDER — TRAZODONE HCL 50 MG PO TABS
100.0000 mg | ORAL_TABLET | Freq: Every day | ORAL | Status: DC
  Administered 2021-05-25 – 2021-05-29 (×5): 100 mg via ORAL
  Filled 2021-05-25 (×5): qty 2

## 2021-05-25 NOTE — Progress Notes (Signed)
PROGRESS NOTE   Subjective/Complaints:   Pt reports not sleeping well- waking up q1-2 hours- took tylenol yesterday x2- works much better.    ROS:   Pt denies SOB, abd pain, CP, N/V/C/D, and vision changes  Objective:   No results found.  Recent Labs    05/23/21 0517  WBC 7.8  HGB 13.6  HCT 40.8  PLT 254   Recent Labs    05/23/21 0517  NA 137  K 4.3  CL 105  CO2 25  GLUCOSE 105*  BUN 12  CREATININE 0.71  CALCIUM 8.6*    Intake/Output Summary (Last 24 hours) at 05/25/2021 1055 Last data filed at 05/25/2021 0828 Gross per 24 hour  Intake 1080 ml  Output --  Net 1080 ml        Physical Exam: Vital Signs Blood pressure (!) 137/94, pulse 81, temperature 98.2 F (36.8 C), temperature source Oral, resp. rate 16, height 5\' 10"  (1.778 m), weight 102 kg, SpO2 99 %.     General: awake, alert, appropriate, sitting EOB; NAD HENT: conjugate gaze; oropharynx moist CV: regular rate; no JVD Pulmonary: a little coarse- good air movement GI: soft, NT, ND, (+)BS Psychiatric: appropriate- very slightly delayed responses Neurological: alert Skin: dry feet, chronic vascular changes--stable to improved in appearance  Neuro: Pt is cognitively appropriate with reasonable insight, memory, and awareness. Mild STM deficits Cranial nerves 2-12 are intact. Stocking glove sensory loss in feet remains. Reflexes are 2+ in all 4's. Fine motor coordination is intact. No tremors. Motor function is grossly 4/5 and   4/5 in lower extremities. Good siting balance. +Tinel's left ulnar groove--ulnar sensory changes-still present.  No ulnar muscle weakness Musculoskeletal: Full ROM, No pain with AROM or PROM in the neck, trunk, or extremities. Posture appropriate      Assessment/Plan: 1. Functional deficits which require 3+ hours per day of interdisciplinary therapy in a comprehensive inpatient rehab setting. Physiatrist is providing  close team supervision and 24 hour management of active medical problems listed below. Physiatrist and rehab team continue to assess barriers to discharge/monitor patient progress toward functional and medical goals  Care Tool:  Bathing    Body parts bathed by patient: Face   Body parts bathed by helper: Right lower leg, Left lower leg     Bathing assist Assist Level: Set up assist     Upper Body Dressing/Undressing Upper body dressing   What is the patient wearing?: Pull over shirt    Upper body assist Assist Level: Supervision/Verbal cueing    Lower Body Dressing/Undressing Lower body dressing      What is the patient wearing?: Incontinence brief, Pants     Lower body assist Assist for lower body dressing: Contact Guard/Touching assist     Toileting Toileting    Toileting assist Assist for toileting: Set up assist     Transfers Chair/bed transfer  Transfers assist     Chair/bed transfer assist level: Contact Guard/Touching assist     Locomotion Ambulation   Ambulation assist      Assist level: Contact Guard/Touching assist Assistive device: Walker-rolling Max distance: 250   Walk 10 feet activity   Assist  Assist level: Contact Guard/Touching assist Assistive device: Walker-rolling   Walk 50 feet activity   Assist    Assist level: Contact Guard/Touching assist Assistive device: Walker-rolling    Walk 150 feet activity   Assist Walk 150 feet activity did not occur: Safety/medical concerns  Assist level: Contact Guard/Touching assist Assistive device: Walker-rolling    Walk 10 feet on uneven surface  activity   Assist     Assist level: Minimal Assistance - Patient > 75% Assistive device: Aeronautical engineer Will patient use wheelchair at discharge?: No Type of Wheelchair: Manual    Wheelchair assist level: Supervision/Verbal cueing Max wheelchair distance: 50    Wheelchair 50 feet with 2  turns activity    Assist        Assist Level: Supervision/Verbal cueing   Wheelchair 150 feet activity     Assist          Blood pressure (!) 137/94, pulse 81, temperature 98.2 F (36.8 C), temperature source Oral, resp. rate 16, height 5\' 10"  (1.778 m), weight 102 kg, SpO2 99 %.  Medical Problem List and Plan: 1.  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  Patient has completed high-dose penicillin followed by infectious disease             -patient may shower             -ELOS/Goals: 7-10  Days, mod I to supervision for PT, OT, SLP  Con't PT, OT, and SLP -pt may get up to walk with staff if they have time to do so outside of his therapy sessions 2.  Antithrombotics: -DVT/anticoagulation: Subcutaneous heparin             -antiplatelet therapy: N/A 3. Pain Management: Voltaren gel 3 times daily as needed, Tylenol as needed  6/11- asked him to either ask for pain meds/tylenol more, or can upgrade pain meds?  6/12- did better with tylenol x2 yesterday- con't regimen 4. Mood/insomnia: Provide emotional support  -continue trazodone 50mg  qhs at 9pm helpful             -antipsychotic agents: N/A 5. Neuropsych: This patient is capable of making decisions on his own behalf. 6. Skin/Wound Care: Routine skin checks 7. Fluids/Electrolytes/Nutrition:   -low albumin   -eating well,now, added protein supplement  6/10 I personally reviewed the patient's labs today.  WNL BUN/Cr ok 6/11- pt's drinking 6-8 juices/day- asked him to drink 8-10.  8.  Dysphagia.  Dysphagia #3 honey thick liquids.  Tolerating diet             -BUN/Cr look fine  -advance liquids per SLP. I would expect that we could advance him soon 9.  Atrial fibrillation/flutter.  Amiodarone 200 mg twice daily.  Cardiac rate controlled at present 10.  MRSA pneumonia/ID.  Patient completed 7-day course of linezolid as of 05/17/2021  -IS, OOB  -will check with ID on  need for f/u prior to d/c or after d/c 11.  Hypertension.  Cardura 2 mg daily.  Monitor with increased mobility  6/11- BP is well controlled- con't regimen 12. Oral Thrush: diflucan 100mg  po x 5 days completed-looks better 13. HOH: hears best through left ear             - hearing aids brought from home             -able to hear fairly well if provider speaks loudly and clearly 14. Leukocytosis: stable at  13k  -no s/s infection--recheck 6/10 15. Left hand/arm numbness  -likely ulnar nerve compression at elbow during ICU stay  -encouraged extension of elbow  -wearing elbow pad  -consider outpt NCS 16. Insomnia 6/12- will increase trazodone to 100 mg QHS- at 9pm- and monitor   LOS: 6 days A FACE TO FACE EVALUATION WAS PERFORMED  Selenia Mihok 05/25/2021, 10:55 AM

## 2021-05-25 NOTE — Progress Notes (Signed)
Physical Therapy Session Note  Patient Details  Name: Hayden Mcbride MRN: [id number] Date of Birth: 11/04/1961  Today's Date: 05/25/2021 PT Individual Time: 0900-1000 PT Individual Time Calculation (min): 60 min   Short Term Goals: Week 1:  PT Short Term Goal 1 (Week 1): STGs=LTGs  Skilled Therapeutic Interventions/Progress Updates:  Pt  resting in bed.  He denied pain.  Supine> sit independently.    Transfer training without use of hand from raised bed, x 3 using Bobath method of clasped hands in front of him.   .stand pivot transfer with RW to wc with CGA.  On firm mat in therapy room, rolling L><R independently.  Supine> sit to R as per home setting with supervision.  Pt mildly dizzy upon sitting up; resolved in less than 1 minute.  Pt education for self stretching R/L hamstrings and heel cords, in sitting using gait belt, x 30 seconds x 3.  Hand out provided.  Discussed fitness for life regarding pt's activity level and weight control.  Pt plans to walk in his neighborhood with wife once cleared by PT.  neuromuscular re-education via multimodal cues and forced use for:  wc propulsion x 50' , supervision, using bil LEs only to facilitate ankle DF; supine-cervical flexion;hook lying- bil adductor squeezes with pelvic tilts,  R/L straight leg raises; biased to L standing x 3 mintes during bil UE activity of folding towels and pillow cases.  Review of diaphragmatic breathing with use of quick sniffs to engage diaphragm, with fair carry over.  Gait training to return to room, x 250' with RW, CG fading to close supervision. Pt demonstrated wider BOS and more erect posture without cues, compared to yesterday with this PT.    At end of session, pt seated in wc with seat belt alarm set and needs at hand. Hand out fissued or Morton for balance.      Therapy Documentation Precautions:  Precautions Precautions: Fall Precaution Comments: generalized weakness, delayed  processing Restrictions Weight Bearing Restrictions: No        Therapy/Group: Individual Therapy  Jorene Kaylor 05/25/2021, 10:14 AM

## 2021-05-25 NOTE — Progress Notes (Signed)
RT NOTE:  Pt has home CPAP @ bedside that he manages. 

## 2021-05-25 NOTE — Progress Notes (Signed)
Physical Therapy Session Note  Patient Details  Name: Hayden Mcbride MRN: [id number] Date of Birth: March 21, 1961  Today's Date: 05/25/2021 PT Individual Time: 0900-1000 PT Individual Time Calculation (min): 60 min   Short Term Goals: Week 1:  PT Short Term Goal 1 (Week 1): STGs=LTGs  Skilled Therapeutic Interventions/Progress Updates:  Chart reviewed. Pt found sitting EOB and agreeable to PT. Session focused on functional transfers, dynamic balance and increasing ambulation independence and endurance for household and community access.  Pt received medication from RN and then participated in SPT to [facility] with SBA. Pt then transported to rehab gym for time management. In gym, patient participated in 4 trials of ambulation with SBA + RW at distances of 181ft + 156ft + 262ft + 525ft. Pt noted to take 1 standing break during last trial, reporting a feeling of fatigue in LLE. Pt then participated in 4 trials of dynamic standing practice. Pt practiced picking cards from alternating side tables and placing on wall. Pt completed 2 trials on solid floor with SBA and no AD and 2 trials on dynamic, foam surface with CGA and no AD. Pt then returned to room and ambulated 20 ft with HHA and no AD to progress ambulation independence. Pt then participated in BLE strengthening, completing 3x 5 sit to stands with feet staggered R forward, feet staggered L forward, and feet side-by-side. Of note, for side-by-side stance sit to stand, pt was able to progress to no UE support.  At end of session, pt as left seated in recliner with alarm engaged, call bell and all needs in reach.  Therapy Documentation Precautions:  Precautions Precautions: Fall Precaution Comments: generalized weakness, delayed processing Restrictions Weight Bearing Restrictions: No  Pain: Pain Assessment Pain Scale: Faces Faces Pain Scale: No hurt    Therapy/Group: Individual Therapy  Marquette Saa, PT, DPT 05/25/2021, 12:59 PM

## 2021-05-25 NOTE — Progress Notes (Signed)
Speech Language Pathology Daily Session Note  Patient Details  Name: Hayden Mcbride MRN: [id number] Date of Birth: [birth year]  Today's Date: 05/25/2021 SLP Individual Time: 1015-1030 SLP Individual Time Calculation (min): 15 min  Short Term Goals: Week 1: SLP Short Term Goal 1 (Week 1): STGs=LTGs due to ELOS  Skilled Therapeutic Interventions: Skilled SLP intervention focused on breath support. Pt completed diaphragmatic breathing with moderate tactile and visual cues to decrease clavicular breathing . He sustained "ah" for 14 seconds across 3 trials. Breath support for speech exercise completed with patient reading 4-5 word sentences with min A/ Pt seated upright in wheelchair this session and transferred back to bed at endof session. Bed alarm set and call button within reach. Cont with therapy per plan of care.      Pain Pain Assessment Pain Scale: Faces Pain Score: 1  Faces Pain Scale: No hurt Pain Type: Acute pain Pain Location: Leg Pain Orientation: Right;Left Pain Descriptors / Indicators: Discomfort Pain Frequency: Occasional Pain Onset: With Activity Patients Stated Pain Goal: 1 Pain Intervention(s): Medication (See eMAR) Multiple Pain Sites: No  Therapy/Group: Individual Therapy  Darrol Poke Clarity Ciszek 05/25/2021, 10:52 AM

## 2021-05-25 NOTE — Progress Notes (Signed)
Speech Language Pathology Daily Session Note  Patient Details  Name: [name] MAJKOWSKI MRN: [id number] Date of Birth: 11/07/1961  Today's Date: 05/25/2021 SLP Individual Time: 0730-0800 SLP Individual Time Calculation (min): 30 min  Short Term Goals: Week 1: SLP Short Term Goal 1 (Week 1): STGs=LTGs due to ELOS  Skilled Therapeutic Interventions: Skilled SLP intervention focused on dysphagia. Oral care completed at beginning of session. Pt seen with ice chips. Throat clear noted intermittently throughout trials. He was able to recall MBSS scheduled for tomorrow. He tolerated dys 3 breakfast tray and honey thick liquids with no overt s/sx of aspiration or penetration.  Pt ambulated to bathroom with min A and FWW. Pt instructed to pull call light when ready. RN notified that patient was left on toilet. Cont with therapy per plan of care.      Pain Pain Assessment Pain Scale: Faces Pain Score: 2  Faces Pain Scale: No hurt Pain Type: Acute pain Pain Location: Leg Pain Orientation: Right;Left Pain Descriptors / Indicators: Discomfort Pain Frequency: Occasional Pain Onset: With Activity Patients Stated Pain Goal: 1 Pain Intervention(s): Medication (See eMAR) Multiple Pain Sites: No  Therapy/Group: Individual Therapy  Darrol Poke Vallen Calabrese 05/25/2021, 7:57 AM

## 2021-05-26 ENCOUNTER — Inpatient Hospital Stay (HOSPITAL_COMMUNITY): Payer: Commercial Managed Care - PPO

## 2021-05-26 LAB — GLUCOSE, CAPILLARY
Glucose-Capillary: 90 mg/dL (ref 70–99)
Glucose-Capillary: 114 mg/dL — ABNORMAL HIGH (ref 70–99)
Glucose-Capillary: 106 mg/dL — ABNORMAL HIGH (ref 70–99)
Glucose-Capillary: 114 mg/dL — ABNORMAL HIGH (ref 70–99)

## 2021-05-26 MED ORDER — JUVEN PO PACK
1.0000 | PACK | Freq: Two times a day (BID) | ORAL | Status: DC
  Administered 2021-05-27 – 2021-05-28 (×4): 1 via ORAL
  Filled 2021-05-26 (×5): qty 1

## 2021-05-26 NOTE — Progress Notes (Signed)
Speech Language Pathology Daily Session Note  Patient Details  Name: Hayden Mcbride MRN: [id number] Date of Birth: 1961/08/19  Today's Date: 05/26/2021 SLP Individual Time: 1100-1135 SLP Individual Time Calculation (min): 35 min  Short Term Goals: Week 1: SLP Short Term Goal 1 (Week 1): STGs=LTGs due to ELOS  Skilled Therapeutic Interventions: Skilled treatment session focused on dysphagia goals and family education. SLP facilitated session by providing trials of thin liquids via cup with his head in neutral position, head turns to either side and slightly relined. Patient with consistent and overt s/s of aspiration across all trials. Patient also given trials of nectar-thick liquids that he consumed with an intermittent subtle throat clear but with reports of it feeling "more comfortable." Therefore, recommend patient upgrade to nectar-thick liquids. Patient and wife provided education regarding current swallowing function while utilizing video of most recent MBS. Also educated regarding diet recommendations, appropriate textures, swallowing compensatory strategies and mediation administration. Both verbalized understanding and handouts were given to reinforce information. Patient left upright sitting EOB with alarm on and all needs within reach. Continue with current plan of care.      Pain No/Denies Pain   Therapy/Group: Individual Therapy  Lou Irigoyen 05/26/2021, 1:18 PM

## 2021-05-26 NOTE — Progress Notes (Signed)
Patient ID: Hayden Mcbride, male   DOB: [birth year], 60 y.o.   MRN: [id number]  Met wit pt and wife when here for education about equipment needs. They feel only need is rolling walker, does not feel 3 in 1 or tub bench is needed. Wife has questions going home on blood thinner or insulin since getting them here. Will ask RN to address or PA.

## 2021-05-26 NOTE — Progress Notes (Signed)
PROGRESS NOTE   Subjective/Complaints: Requests that Ensures be d/ced. Replaced with Juven given low albumin.   ROS:   Pt denies SOB, abd pain, CP, N/V/C/D, and vision changes  Objective:   DG Swallowing Func-Speech Pathology  Result Date: 05/26/2021 Formatting of this result is different from the original. Objective Swallowing Evaluation: Type of Study: MBS-Modified Barium Swallow Study  Patient Details Name: Hayden Mcbride MRN: [id number] Date of Birth: Nov 13, 1961 Today's Date: 05/26/2021 Past Medical History: Past Medical History: Diagnosis Date  Allergy   Dry eyes   GERD (gastroesophageal reflux disease)   Glaucoma   Hx of adenomatous colonic polyps 07/08/2006  06/2006 - diminutive adenoma 04/19/2015 - diminutive adenoma and one polyp lost - repeat colonoscopy 2021  Hypertension   Post-operative nausea and vomiting   Sleep apnea   wears CPAP  Thyroid disease   long ago- half thyroid removed ~20 yrs ago  Past Surgical History: Past Surgical History: Procedure Laterality Date  BREATH TEK H PYLORI N/A 12/24/2014  Procedure: BREATH TEK H PYLORI;  Surgeon: Alphonsa Overall, MD;  Location: [facility];  Service: General;  Laterality: N/A;  CHOLECYSTECTOMY    COLONOSCOPY    EYE SURGERY    age 74   INNER EAR SURGERY    tumor inside right ear  POLYPECTOMY    SHOULDER SURGERY    left shoulder  THYROID SURGERY   HPI: see H&P  Subjective: Pt awake, alert, pleasant, participative Assessment / Plan / Recommendation CHL IP CLINICAL IMPRESSIONS 05/26/2021 Clinical Impression Patient continues to demonstrate a moderate dysphagia.  Patient has a bony protrusion at the level of C3-C4 that impedes into his pharynx resulting in poor epiglottic inversion.  This results in inconsistent and intermittent penetration and aspiration of both nectar-thick and thin liquids.  Mild residuals are also noted due to reduced cricopharyngeal relaxation and prominent cricopharyngeus.   Postural changes were ineffective and reducing penetration/aspiration and appeared to increase aspiration.  Patient had silent aspiration with small amounts but did sense larger amounts of aspirates. Cued throat clear/cough was effective in removing penetrates and majority of aspirates.  Initially was going to upgrade patient to thin liquids, however, at the bedside after the MBS, patient with consistent overt s/s of aspiration of thin via cup. S/s of aspiration were reduced with nectar-thick liquids with patient reports of swallowing feeling "more comfortable" with nectar-thick liquids.  Recommend patient continue Dys. 3 textures and upgrade to nectar-thick liquids with initiation of the water protocol. Patient and wife verbalized understanding and agreement. SLP with f/u for tolerance. SLP Visit Diagnosis -- Attention and concentration deficit following -- Frontal lobe and executive function deficit following -- Impact on safety and function --   CHL IP TREATMENT RECOMMENDATION 05/26/2021 Treatment Recommendations Therapy as outlined in treatment plan below   Prognosis 05/26/2021 Prognosis for Safe Diet Advancement Good Barriers to Reach Goals Severity of deficits Barriers/Prognosis Comment -- CHL IP DIET RECOMMENDATION 05/26/2021 SLP Diet Recommendations Dysphagia 3 (Mech soft) solids;Nectar thick liquid Liquid Administration via Cup Medication Administration Crushed with puree Compensations Clear throat after each swallow;Multiple dry swallows after each bite/sip Postural Changes --   CHL IP OTHER RECOMMENDATIONS 05/26/2021 Recommended Consults -- Oral  Care Recommendations Oral care BID Other Recommendations Order thickener from pharmacy;Prohibited food (jello, ice cream, thin soups);Remove water pitcher;Have oral suction available   CHL IP FOLLOW UP RECOMMENDATIONS 05/26/2021 Follow up Recommendations Inpatient Rehab   CHL IP FREQUENCY AND DURATION 05/26/2021 Speech Therapy Frequency (ACUTE ONLY) min 3x week  Treatment Duration 2 weeks      CHL IP ORAL PHASE 05/26/2021 Oral Phase WFL Oral - Pudding Teaspoon -- Oral - Pudding Cup -- Oral - Honey Teaspoon -- Oral - Honey Cup -- Oral - Nectar Teaspoon -- Oral - Nectar Cup -- Oral - Nectar Straw -- Oral - Thin Teaspoon -- Oral - Thin Cup -- Oral - Thin Straw -- Oral - Puree -- Oral - Mech Soft -- Oral - Regular -- Oral - Multi-Consistency -- Oral - Pill -- Oral Phase - Comment --  CHL IP PHARYNGEAL PHASE 05/26/2021 Pharyngeal Phase -- Pharyngeal- Pudding Teaspoon -- Pharyngeal -- Pharyngeal- Pudding Cup -- Pharyngeal -- Pharyngeal- Honey Teaspoon NT Pharyngeal -- Pharyngeal- Honey Cup NT Pharyngeal -- Pharyngeal- Nectar Teaspoon NT Pharyngeal -- Pharyngeal- Nectar Cup Penetration/Aspiration during swallow;Reduced epiglottic inversion Pharyngeal Material does not enter airway;Material enters airway, remains ABOVE vocal cords then ejected out;Material enters airway, passes BELOW cords then ejected out Pharyngeal- Nectar Straw Reduced laryngeal elevation;Penetration/Aspiration during swallow Pharyngeal Material enters airway, passes BELOW cords without attempt by patient to eject out (silent aspiration);Material does not enter airway;Material enters airway, remains ABOVE vocal cords then ejected out Pharyngeal- Thin Teaspoon -- Pharyngeal -- Pharyngeal- Thin Cup Reduced epiglottic inversion;Penetration/Aspiration during swallow Pharyngeal Material does not enter airway;Material enters airway, remains ABOVE vocal cords then ejected out;Material enters airway, passes BELOW cords and not ejected out despite cough attempt by patient Pharyngeal- Thin Straw Penetration/Aspiration during swallow;Reduced epiglottic inversion Pharyngeal Material enters airway, passes BELOW cords without attempt by patient to eject out (silent aspiration) Pharyngeal- Puree Reduced epiglottic inversion;Penetration/Aspiration during swallow Pharyngeal Material enters airway, remains ABOVE vocal cords then  ejected out Pharyngeal- Mechanical Soft Reduced epiglottic inversion Pharyngeal Material does not enter airway Pharyngeal- Regular NT Pharyngeal -- Pharyngeal- Multi-consistency -- Pharyngeal -- Pharyngeal- Pill -- Pharyngeal -- Pharyngeal Comment --  CHL IP CERVICAL ESOPHAGEAL PHASE 05/26/2021 Cervical Esophageal Phase Impaired Pudding Teaspoon -- Pudding Cup -- Honey Teaspoon NT Honey Cup NT Nectar Teaspoon NT Nectar Cup Prominent cricopharyngeal segment;Reduced cricopharyngeal relaxation Nectar Straw Reduced cricopharyngeal relaxation;Prominent cricopharyngeal segment Thin Teaspoon Reduced cricopharyngeal relaxation;Prominent cricopharyngeal segment Thin Cup Prominent cricopharyngeal segment;Reduced cricopharyngeal relaxation Thin Straw Reduced cricopharyngeal relaxation;Prominent cricopharyngeal segment Puree Prominent cricopharyngeal segment;Reduced cricopharyngeal relaxation Mechanical Soft Prominent cricopharyngeal segment;Reduced cricopharyngeal relaxation Regular -- Multi-consistency -- Pill -- Cervical Esophageal Comment -- PAYNE, COURTNEY 05/26/2021, 1:16 PM    Weston Anna, MA, CCC-SLP [phone]            No results for input(s): WBC, HGB, HCT, PLT in the last 72 hours.  No results for input(s): NA, K, CL, CO2, GLUCOSE, BUN, CREATININE, CALCIUM in the last 72 hours.   Intake/Output Summary (Last 24 hours) at 05/26/2021 1617 Last data filed at 05/26/2021 1251 Gross per 24 hour  Intake 780 ml  Output --  Net 780 ml        Physical Exam: Vital Signs Blood pressure (!) 115/91, pulse 75, temperature 98 F (36.7 C), temperature source Oral, resp. rate 16, height 5\' 10"  (1.778 m), weight 101.6 kg, SpO2 98 %. Gen: no distress, normal appearing HEENT: oral mucosa pink and moist, NCAT Cardio: Reg rate Chest: normal effort, normal rate of breathing Abd:  soft, non-distended Ext: no edema Psych: pleasant, normal affect Skin: dry feet, chronic vascular changes--stable to improved in  appearance  Neuro: Pt is cognitively appropriate with reasonable insight, memory, and awareness. Mild STM deficits Cranial nerves 2-12 are intact. Stocking glove sensory loss in feet remains. Reflexes are 2+ in all 4's. Fine motor coordination is intact. No tremors. Motor function is grossly 4/5 and   4/5 in lower extremities. Good siting balance. +Tinel's left ulnar groove--ulnar sensory changes-still present.  No ulnar muscle weakness Musculoskeletal: Full ROM, No pain with AROM or PROM in the neck, trunk, or extremities. Posture appropriate      Assessment/Plan: 1. Functional deficits which require 3+ hours per day of interdisciplinary therapy in a comprehensive inpatient rehab setting. Physiatrist is providing close team supervision and 24 hour management of active medical problems listed below. Physiatrist and rehab team continue to assess barriers to discharge/monitor patient progress toward functional and medical goals  Care Tool:  Bathing    Body parts bathed by patient: Face   Body parts bathed by helper: Right lower leg, Left lower leg     Bathing assist Assist Level: Set up assist     Upper Body Dressing/Undressing Upper body dressing   What is the patient wearing?: Pull over shirt    Upper body assist Assist Level: Supervision/Verbal cueing    Lower Body Dressing/Undressing Lower body dressing      What is the patient wearing?: Incontinence brief, Pants     Lower body assist Assist for lower body dressing: Contact Guard/Touching assist     Toileting Toileting    Toileting assist Assist for toileting: Set up assist     Transfers Chair/bed transfer  Transfers assist     Chair/bed transfer assist level: Supervision/Verbal cueing Chair/bed transfer assistive device: Programmer, multimedia   Ambulation assist      Assist level: Supervision/Verbal cueing Assistive device: Walker-rolling Max distance: >350 ft   Walk 10 feet  activity   Assist     Assist level: Supervision/Verbal cueing Assistive device: Walker-rolling   Walk 50 feet activity   Assist    Assist level: Supervision/Verbal cueing Assistive device: Walker-rolling    Walk 150 feet activity   Assist Walk 150 feet activity did not occur: Safety/medical concerns  Assist level: Supervision/Verbal cueing Assistive device: Walker-rolling    Walk 10 feet on uneven surface  activity   Assist     Assist level: Supervision/Verbal cueing Assistive device: Aeronautical engineer Will patient use wheelchair at discharge?: No Type of Wheelchair: Manual    Wheelchair assist level: Supervision/Verbal cueing Max wheelchair distance: 50    Wheelchair 50 feet with 2 turns activity    Assist        Assist Level: Supervision/Verbal cueing   Wheelchair 150 feet activity     Assist          Blood pressure (!) 115/91, pulse 75, temperature 98 F (36.7 C), temperature source Oral, resp. rate 16, height 5\' 10"  (1.778 m), weight 101.6 kg, SpO2 98 %.  Medical Problem List and Plan: 1.  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  Patient has completed high-dose penicillin followed by infectious disease             -patient may shower             -ELOS/Goals: 7-10  Days, mod I to supervision for PT, OT, SLP  Continue PT, OT, and SLP -  pt may get up to walk with staff if they have time to do so outside of his therapy sessions 2.  Impaired mobility -DVT/anticoagulation: D/c Subcutaneous heparin since ambulating >700 feet             -antiplatelet therapy: N/A 3. Pain Management: Voltaren gel 3 times daily as needed, Tylenol as needed  6/13 continue regimen 4. Mood/insomnia: Provide emotional support  -continue trazodone 50mg  qhs at 9pm helpful             -antipsychotic agents: N/A 5. Neuropsych: This patient is capable of making decisions on his  own behalf. 6. Skin/Wound Care: Routine skin checks 7. Hypoalbuminemia  -d/c ensure since he does not like. Replaced with Juven.  8.  Dysphagia.  Dysphagia #3 honey thick liquids.  Tolerating diet             -BUN/Cr look fine  -advance liquids per SLP. I would expect that we could advance him soon 9.  Atrial fibrillation/flutter.  Amiodarone 200 mg twice daily.  Cardiac rate controlled at present 10.  MRSA pneumonia/ID.  Patient completed 7-day course of linezolid as of 05/17/2021  -IS, OOB  -will check with ID on need for f/u prior to d/c or after d/c 11.  Hypertension.  Cardura 2 mg daily.  Monitor with increased mobility  6/11- BP is well controlled- con't regimen 12. Oral Thrush: diflucan 100mg  po x 5 days completed-looks better 13. HOH: hears best through left ear             - hearing aids brought from home             -able to hear fairly well if provider speaks loudly and clearly 14. Leukocytosis: stable at 13k  -no s/s infection--recheck 6/10 15. Left hand/arm numbness  -likely ulnar nerve compression at elbow during ICU stay  -encouraged extension of elbow  -wearing elbow pad  -consider outpt NCS 16. Insomnia 6/12- will increase trazodone to 100 mg QHS- at 9pm- and monitor   LOS: 7 days A FACE TO FACE EVALUATION WAS PERFORMED  Clide Deutscher Amaliya Whitelaw 05/26/2021, 4:17 PM

## 2021-05-26 NOTE — Progress Notes (Signed)
Physical Therapy Session Note  Patient Details  Name: Hayden Mcbride MRN: [id number] Date of Birth: Jul 21, 1961  Today's Date: 05/26/2021 PT Individual Time: 8676-1950 PT Individual Time Calculation (min): 54 min   Short Term Goals: Week 1:  PT Short Term Goal 1 (Week 1): STGs=LTGs  Skilled Therapeutic Interventions/Progress Updates:     Patient in recliner in the room upon PT arrival. Patient alert and agreeable to PT session. Patient denied pain during session. Patient's wife arrived at beginning of session for family education. Patient's wife provided safe guarding with all mobility throughout session. Expressed concerns about patient' weight loss and muscle weakness. Reports that patient needs to be mod I in the home, as she will be unable to provide supervision consistently throughout the day due to work demands, however, she will be working from home and be available intermittently. Also, expressed concerns about transport for [facility] and patient returning to driving, will pass on to [place] and discuss with rehab team.  Therapeutic Activity: Bed Mobility: Patient performed rolling R/L independently and supine to/from sit with supervision for safety due to increased effort in the ADL bed. Provided verbal cues for breath support and forward rotation to push up when coming to sitting. Transfers: Patient performed sit to/from stand from the hospital recliner x1, standard arm chair x2, ADL recliner x1, and ADL bed x1 with supervision. Required 2 attempts from ADL recliner due to low surface height, educated on use of pillow/cushion/folded blanket to elevate seat height if needed.  Provided verbal cues for forward weight shift and completing turns prior to sitting for safety. Patient performed a simulated sedan height car transfer with supervision using RW. Provided cues for safe technique.  Gait Training:  Patient ambulated >350 feet >100 feet x2, and >200 feet x1 using RW with supervision. Ambulated  with decreased gait speed, decreased step length and height, mild L trendelenburg, intermittent narrow BOS (patient self-corrected without cues x2), forward trunk lean, and downward head gaze.Marland Kitchen Provided verbal cues for looking ahead, proximity to RW, especially with turns, and erect posture for improved breath support. Provided positive reinforcement for self-correct of gait deviations throughout. Patient ambulated up/down a ramp, over 10 feet of mulch (unlevel surface), and up/down a curb to simulate community ambulation over unlevel surfaces with CGA-close supervision using RW. Provided cues for technique and use of AD. Patient ascended/descended 4x6" steps x2 without a rest break using B rails with close supervision. Performed reciprocal gait pattern however provided cues for step to gait pattern for safety due to L lower extremity weakness, however without signs of buckling. Provided cues for technique and sequencing. Demonstrated safe guarding technique on first trial and patient's wife performed safe guarding technique on second trial. Educated on management of LOB on step and safe fall assist to reduce risk of injury.  Educated patient and his wife on fall risk/prevention, home modifications to prevent falls, activation of emergency services in the event of a fall, energy conservation techniques OP vs HH PT, and review of HEP handout for strengthening at hom during session.   Patient in recliner with his wife and NT in the room at end of session with breaks locked, NT to set chair alarm alarm set, and all needs within reach.   Therapy Documentation Precautions:  Precautions Precautions: Fall Precaution Comments: generalized weakness, delayed processing Restrictions Weight Bearing Restrictions: No    Therapy/Group: Individual Therapy  Hayden Mcbride L Hayden Mcbride PT, DPT  05/26/2021, 8:50 AM

## 2021-05-26 NOTE — Progress Notes (Signed)
Modified Barium Swallow Progress Note  Patient Details  Name: Hayden Mcbride MRN: [id number] Date of Birth: 1961/07/19  Today's Date: 05/26/2021  Modified Barium Swallow completed.  Full report located under Chart Review in the Imaging Section.  Brief recommendations include the following:  Clinical Impression  Patient continues to demonstrate a moderate dysphagia.  Patient has a bony protrusion at the level of C3-C4 that impedes into his pharynx resulting in poor epiglottic inversion.  This results in inconsistent and intermittent penetration and aspiration of both nectar-thick and thin liquids.  Mild residuals are also noted due to reduced cricopharyngeal relaxation and prominent cricopharyngeus.  Postural changes were ineffective and reducing penetration/aspiration and appeared to increase aspiration.  Patient had silent aspiration with small amounts but did sense larger amounts of aspirates. Cued throat clear/cough was effective in removing penetrates and majority of aspirates.  Initially was going to upgrade patient to thin liquids, however, at the bedside after the MBS, patient with consistent overt s/s of aspiration of thin via cup. S/s of aspiration were reduced with nectar-thick liquids with patient reports of swallowing feeling "more comfortable" with nectar-thick liquids.  Recommend patient continue Dys. 3 textures and upgrade to nectar-thick liquids with initiation of the water protocol. Patient and wife verbalized understanding and agreement. SLP with f/u for tolerance.   Swallow Evaluation Recommendations       SLP Diet Recommendations: Dysphagia 3 (Mech soft) solids;Nectar thick liquid   Liquid Administration via: Cup   Medication Administration: Crushed with puree   Supervision: Patient able to self feed;Intermittent supervision to cue for compensatory strategies   Compensations: Clear throat after each swallow;Multiple dry swallows after each bite/sip       Oral Care  Recommendations: Oral care BID   Other Recommendations: Order thickener from pharmacy;Prohibited food (jello, ice cream, thin soups);Remove water pitcher;Have oral suction available    Kaelea Gathright 05/26/2021,1:15 PM

## 2021-05-26 NOTE — Progress Notes (Signed)
Occupational Therapy Session Note  Patient Details  Name: Hayden Mcbride MRN: [id number] Date of Birth: Dec 05, 1961  Today's Date: 05/26/2021 OT Individual Time: 1330-1405 OT Individual Time Calculation (min): 35 min    Short Term Goals: Week 1:  OT Short Term Goal 1 (Week 1): LTG=STG 2/2 ELOS  Skilled Therapeutic Interventions/Progress Updates:    Pt received in recliner and consented to OT tx. Session focused on BUE strengthening HEP to increase strength and activity tolerance for ADLs. Pt wheeled outside for time, completed 1 set of exercises before requesting to finish session in gym 2/2 warm temperature. Pt instructed in orange theraband exercises including elbow flexion, elbow extension, chest press, and shoulder flexion all for 3x15 with min cuing for proper tech with good carryover. Pt req frequent rest breaks due to fatigue. After tx, pt helped back to bed and left with all needs met.   Therapy Documentation Precautions:  Precautions Precautions: Fall Precaution Comments: generalized weakness, delayed processing Restrictions Weight Bearing Restrictions: No  Vital Signs: Therapy Vitals Temp: 98 F (36.7 C) Temp Source: Oral Pulse Rate: 75 Resp: 16 BP: (!) 115/91 Patient Position (if appropriate): Sitting Oxygen Therapy SpO2: 98 % O2 Device: Room Air Pain: none      Therapy/Group: Individual Therapy  Keal[place]ilani Maiorino 05/26/2021, 2:39 PM

## 2021-05-26 NOTE — Progress Notes (Signed)
Occupational Therapy Session Note  Patient Details  Name: Hayden Mcbride MRN: [id number] Date of Birth: February 13, 1961  Today's Date: 05/26/2021 OT Individual Time: 1000-1100 OT Individual Time Calculation (min): 60 min    Short Term Goals: Week 1:  OT Short Term Goal 1 (Week 1): LTG=STG 2/2 ELOS  Skilled Therapeutic Interventions/Progress Updates:    Session focused on family education session with pt's wife Hayden Mcbride. Pt completed ambulatory transfers around room with RW with supervision overall. Discussed proper guarding techniques and fall recovery. Pt completed shower at supervision level overall, min cueing for doffing LB clothing seated instead of standing. Pt completed 250 ft of functional mobility to the ADL apt with RW at supervision- CGA level. Pt completed furniture transfer on/off rocking recliner to simulate home transfer, he required 2x attempts but only supervision. Discussed walk in shower transfer method with RW use and pt returned demonstration with supervision. Recommended use of routine at home to promote return to ADL/IADL and eventual return to work. Pt returned to his room and was passed off to SLP.   Therapy Documentation Precautions:  Precautions Precautions: Fall Precaution Comments: generalized weakness, delayed processing Restrictions Weight Bearing Restrictions: No   Therapy/Group: Individual Therapy  Curtis Sites 05/26/2021, 7:25 AM

## 2021-05-27 LAB — GLUCOSE, CAPILLARY
Glucose-Capillary: 113 mg/dL — ABNORMAL HIGH (ref 70–99)
Glucose-Capillary: 104 mg/dL — ABNORMAL HIGH (ref 70–99)
Glucose-Capillary: 111 mg/dL — ABNORMAL HIGH (ref 70–99)
Glucose-Capillary: 109 mg/dL — ABNORMAL HIGH (ref 70–99)

## 2021-05-27 NOTE — Progress Notes (Signed)
Physical Therapy Session Note  Patient Details  Name: Hayden Mcbride MRN: [id number] Date of Birth: [birth year]  Today's Date: 05/27/2021 PT Individual Time: 8466-5993 PT Individual Time Calculation (min): 55 min   Short Term Goals: Week 1:  PT Short Term Goal 1 (Week 1): STGs=LTGs  Skilled Therapeutic Interventions/Progress Updates:     Pt received supine in bed and agrees to therapy. Supine to sit with bed features. Pt performs sit to stand with cues for hand placement. Pt ambulates 150' to gym with RW and cues for upright gaze to improve posture and balance. Pt performs "putting" activity with golf balls to work on dynamic balance, including walking to retrieve balls, bending down to pick them up with minA from PT and cues on body mechanics ans safety. Pt mentions ongoing weakness in L hip. Pt then transitions to quadruped and performs 3x5 single leg extensions with AAROM for L leg. Pt performs 1x20 alternating upper extremity lifts prior to needing rest break. In prone, pt perform 1x20 prone press ups with tactile cues for correct performance and focus on hip flexor lengthening. Pt performs 1x10 L leg abduction in R sidelying with AAROM. Supine to sit with supervision. Pt performs standing strength and balance challenge. Holding onto RW, pt performs 3x5 alternating bilateral hip abduction and 2x5 hip extension. Pt then ambulates back to room with CGA and no AD. Left seated at EOB with alarm intact and all needs within reach.  Therapy Documentation Precautions:  Precautions Precautions: Fall Precaution Comments: generalized weakness, delayed processing Restrictions Weight Bearing Restrictions: No   Therapy/Group: Individual Therapy  Breck Coons, PT, DPT 05/27/2021, 11:43 AM

## 2021-05-27 NOTE — Progress Notes (Signed)
Occupational Therapy Session Note  Patient Details  Name: COREN SAGAN MRN: [id number] Date of Birth: 12/19/1960  Today's Date: 05/27/2021 OT Individual Time: 1300-1345 OT Individual Time Calculation (min): 45 min    Short Term Goals: Week 1:  OT Short Term Goal 1 (Week 1): LTG=STG 2/2 ELOS  Skilled Therapeutic Interventions/Progress Updates:      Pt received in bed and consented to OT tx. Session focused on functional mobility, standing balance, and functional activity tolerance. Pt instructed in oral care while standing sink side after lunch in order to have water during session 2/2 water protocol. Pt walked from room to day room with RW and SUP, was given water once he sat down and instructed in swallowing techniques. Pt did not cough at all and took his time with small sips drinking water. Pt then instructed in standing activity with peg board, and then in bean bag toss with corn hole board to increase standing balance for ADLs and IADLs. Pt able to stand and toss bean bags with no device and close SUP. Pt then instructed in functional mobility with item retrieval while reaching outside of BOS and scanning environment to collect all items to simulate IADLs such as grocery shopping. Afterwards, pt given a few more sips of water, which he tolerated well. Pt then instructed to walk back to room with SUP, doffed socks and shoes with independence. Pt did have c/o R forearm discomfort, reports he feels like he pulled a muscle in his forearm from the previous day. Pt given disposable heat pack, instructed to remove after 20 minutes or with any discomfort, pt verbalized understanding, reported the heat was comfortable. Left in bed with alarm on and all needs met.   Therapy Documentation Precautions:  Precautions Precautions: Fall Precaution Comments: generalized weakness, delayed processing Restrictions Weight Bearing Restrictions: No  Vital Signs: Therapy Vitals Temp: 98.5 F (36.9 C) Temp  Source: Oral Pulse Rate: 72 Resp: 18 BP: 115/66 Patient Position (if appropriate): Lying Oxygen Therapy SpO2: 99 % O2 Device: Room Air Pain: 0/10      Therapy/Group: Individual Therapy  Laketta Soderberg 05/27/2021, 2:42 PM

## 2021-05-27 NOTE — Progress Notes (Signed)
Speech Language Pathology Daily Session Note  Patient Details  Name: NICKOLAOS BRALLIER MRN: [id number] Date of Birth: December 30, 1960  Today's Date: 05/27/2021 SLP Individual Time: 0710-0755 SLP Individual Time Calculation (min): 45 min  Short Term Goals: Week 1: SLP Short Term Goal 1 (Week 1): STGs=LTGs due to ELOS  Skilled Therapeutic Interventions: Skilled treatment session focused on dysphagia goals. SLP facilitated session by providing skilled observation with breakfast meal of Dys. 3 textures and nectar-thick liquids to assess tolerance of recent upgrade. Patient consumed meal with intermittent subtle throat clearing but no large coughing episodes observed. Patient was overall Mod I for use of swallowing compensatory strategies. Recommend patient continue current diet. Patient left upright sitting EOB with alarm on and all needs within reach. Continue with current plan of care.      Pain Pain Assessment Pain Scale: 0-10 Pain Score: 0-No pain  Therapy/Group: Individual Therapy  Payge Eppes 05/27/2021, 8:17 AM

## 2021-05-27 NOTE — Progress Notes (Addendum)
Nutrition Follow-up  DOCUMENTATION CODES:  Obesity unspecified  INTERVENTION:  Discontinue Ensure Max.  Discontinue Magic Cup.  Add Mighty Shake II BID with lunch and dinner, each supplement provides 480-500 kcals and 20-23 grams of protein.  Continue MVI with minerals daily.  NUTRITION DIAGNOSIS:  Increased nutrient needs related to  (therapy) as evidenced by estimated needs. - ongoing  GOAL:  Patient will meet greater than or equal to 90% of their needs - meeting  MONITOR:  PO intake, Supplement acceptance, Diet advancement, Skin, Weight trends, Labs, I & O's  REASON FOR ASSESSMENT:  Consult Assessment of nutrition requirement/status  ASSESSMENT:  60 year old male admitted 04/29/2020 with altered mental status changes speech difficulties lethargy headache right gaze preference. MRI of the brain showed diffuse leptomeningeal enhancement and abnormal FLAIR signal throughout subarachnoid spaces compatible with acute meningitis as well as punctate DWI foci along midline parieto-occipital foci with question of trace purulence. LP completed showing marked xanthochromia with elevated opening pressure consistent with encephalitis. MRI/MRV brain repeated showing changes consistent with bacterial meningitis and increase in numerous foci of restricted diffusion about subarachnoid spaces with areas of septation and synechie formation consistent with bacterial meningitis. Pt with functional ability cognitive deficits was admitted for a comprehensive rehab program. 5/31 - Cortrak placed (tip gastric) 6/7 - Cortrak removed  Spoke with pt on bedside. Pt eating 75-100% of most meals. Pt reports that he has a good appetite and is adjusting to what he can and cannot have on Dys 3 with nectar thick. Pt has been refusing Ensure Max because they are not thickened. Pt also reports disliking the [employer]. Pt open to trying Mighty Shakes BID.  Admit wt: 102.4 kg Current wt: 100.7 kg  Medications:  reviewed; SSI, MVI with minerals, Juven BID  Labs: reviewed; CBG 90-114  Diet Order:   Diet Order             DIET DYS 3 Room service appropriate? Yes; Fluid consistency: Nectar Thick  Diet effective now                  EDUCATION NEEDS:  Not appropriate for education at this time  Skin:  Skin Assessment: Reviewed RN Assessment  Last BM:  05/27/21 - Type 4, large  Height:  Ht Readings from Last 1 Encounters:  05/19/21 5\' 10"  (1.778 m)   Weight:  Wt Readings from Last 1 Encounters:  05/27/21 100.7 kg   BMI:  Body mass index is 31.85 kg/m.  Estimated Nutritional Needs:  Kcal:  2100-2300 Protein:  115-125 grams Fluid:  >/= 2 L/day  Derrel Nip, RD, LDN Registered Dietitian I After-Hours/Weekend Pager # in [place]

## 2021-05-27 NOTE — Patient Care Conference (Signed)
Inpatient RehabilitationTeam Conference and Plan of Care Update Date: 05/27/2021   Time: 10:32 AM    Patient Name: Hayden Mcbride      Medical Record Number: [id number]  Date of Birth: 10/05/1961 Sex: Male         Room/Bed: 4W01C/4W01C-01 Payor Info: Payor: Theme park manager / Plan: UMR/[employer] PPO / Product Type: *No Product type* /    Admit Date/Time:  05/19/2021  3:29 PM  Primary Diagnosis:  Pneumococcal meningitis  Hospital Problems: Principal Problem:   Pneumococcal meningitis    Expected Discharge Date: Expected Discharge Date: 05/30/21  Team Members Present: Physician leading conference: Dr. Leeroy Cha Care Coodinator Present: Erlene Quan, BSW;[place] Ursua Creig Hines, RN, BSN, [place] Nurse Present: Dorthula Nettles, RN PT Present: Tereasa Coop, PT OT Present: Cherylynn Ridges, OT SLP Present: Weston Anna, SLP PPS Coordinator present : Ileana Ladd, PT     Current Status/Progress Goal Weekly Team Focus  Bowel/Bladder   Patient is continent of bowel/bladder  Patient will remain continent of bowel/bladder  Will assess qshift and PRN   Swallow/Nutrition/ Hydration   Dys. 3 textures with nectar-thick liquids with water protocol, Supervision for use of strategies  Mod I  Education and tolerane of diet upgrade   ADL's   Supervision/CGA  supervision/mod I  dc planning, general strengthening, self-care retraining, pt/family education   Mobility   supervision overall, ambulating >300' with RW  Supervision  DC prep   Communication   N/A  N/A  N/A   Safety/Cognition/ Behavioral Observations  Supervision-Mod I  Supervision  Education   Pain   Patient reports no pain at this time  Patient will remain pain free  Will assess qshift and PRN   Skin   Patient's skin is intact  Patient's skin will remain intact  Will assess qshift and PRN     Discharge Planning:  Pt to d/c to home with 24/7 care from wife who works from home. Pt accepted for HHPT/OT/SLP with [facility]. FAm  edu completed on 6/13 with pt wife.   Team Discussion: Discontinue IV, K-pad for back, discontinued Heparin. Continent B/B, bruising bilateral on abdomen. Patient on target to meet rehab goals: yes, supervision with all ADL's. Working on Hotel manager and endurance. Ambulated > 371ft with RW, looks anxious when walking. Did a repeat MBS, going home on a Dys 3, nectar.   *See Care Plan and progress notes for long and short-term goals.   Revisions to Treatment Plan:  Medically stable.  Teaching Needs: Family education, medication management, skin/wound care, transfer training, gait training, balance training, endurance training, safety awareness.  Current Barriers to Discharge: Decreased caregiver support, Medical stability, Home enviroment access/layout, Lack of/limited family support, Medication compliance, Behavior, and Nutritional means  Possible Resolutions to Barriers: Continue current medications, provide emotional support.     Medical Summary Current Status: ambulating >700 feet, bruising in abdomen, aspiration on MBS, back ache  Barriers to Discharge: Medical stability;Wound care  Barriers to Discharge Comments: osteophyte protruding in pharynx that contributes to dysphagia, back ache Possible Resolutions to Barriers/Weekly Focus: may d/c IV today, d/c heparin given current ambulation distances, MBS repeated this week, D3 nectar with water protocol, kpad added   Continued Need for Acute Rehabilitation Level of Care: The patient requires daily medical management by a physician with specialized training in physical medicine and rehabilitation for the following reasons: Direction of a multidisciplinary physical rehabilitation program to maximize functional independence : Yes Medical management of patient stability for increased activity during participation in an intensive  rehabilitation regime.: Yes Analysis of laboratory values and/or radiology reports with any subsequent need for  medication adjustment and/or medical intervention. : Yes   I attest that I was present, lead the team conference, and concur with the assessment and plan of the team.   Cristi Loron 05/27/2021, 5:43 PM

## 2021-05-27 NOTE — Progress Notes (Signed)
PROGRESS NOTE   Subjective/Complaints: Asks if IV can be d/ced- order placed Encouraged regarding his progress Kpad added PRN for back ache Moving bowels regularly.   ROS:   Pt denies SOB, abd pain, CP, N/V/C/D, and vision changes, constipation  Objective:   DG Swallowing Func-Speech Pathology  Result Date: 05/26/2021 Formatting of this result is different from the original. Objective Swallowing Evaluation: Type of Study: MBS-Modified Barium Swallow Study  Patient Details Name: Hayden Mcbride MRN: [id number] Date of Birth: 1961-06-16 Today's Date: 05/26/2021 Past Medical History: Past Medical History: Diagnosis Date  Allergy   Dry eyes   GERD (gastroesophageal reflux disease)   Glaucoma   Hx of adenomatous colonic polyps 07/08/2006  06/2006 - diminutive adenoma 04/19/2015 - diminutive adenoma and one polyp lost - repeat colonoscopy 2021  Hypertension   Post-operative nausea and vomiting   Sleep apnea   wears CPAP  Thyroid disease   long ago- half thyroid removed ~20 yrs ago  Past Surgical History: Past Surgical History: Procedure Laterality Date  BREATH TEK H PYLORI N/A 12/24/2014  Procedure: BREATH TEK H PYLORI;  Surgeon: Alphonsa Overall, MD;  Location: [facility];  Service: General;  Laterality: N/A;  CHOLECYSTECTOMY    COLONOSCOPY    EYE SURGERY    age 70   INNER EAR SURGERY    tumor inside right ear  POLYPECTOMY    SHOULDER SURGERY    left shoulder  THYROID SURGERY   HPI: see H&P  Subjective: Pt awake, alert, pleasant, participative Assessment / Plan / Recommendation CHL IP CLINICAL IMPRESSIONS 05/26/2021 Clinical Impression Patient continues to demonstrate a moderate dysphagia.  Patient has a bony protrusion at the level of C3-C4 that impedes into his pharynx resulting in poor epiglottic inversion.  This results in inconsistent and intermittent penetration and aspiration of both nectar-thick and thin liquids.  Mild residuals are also noted due to  reduced cricopharyngeal relaxation and prominent cricopharyngeus.  Postural changes were ineffective and reducing penetration/aspiration and appeared to increase aspiration.  Patient had silent aspiration with small amounts but did sense larger amounts of aspirates. Cued throat clear/cough was effective in removing penetrates and majority of aspirates.  Initially was going to upgrade patient to thin liquids, however, at the bedside after the MBS, patient with consistent overt s/s of aspiration of thin via cup. S/s of aspiration were reduced with nectar-thick liquids with patient reports of swallowing feeling "more comfortable" with nectar-thick liquids.  Recommend patient continue Dys. 3 textures and upgrade to nectar-thick liquids with initiation of the water protocol. Patient and wife verbalized understanding and agreement. SLP with f/u for tolerance. SLP Visit Diagnosis -- Attention and concentration deficit following -- Frontal lobe and executive function deficit following -- Impact on safety and function --   CHL IP TREATMENT RECOMMENDATION 05/26/2021 Treatment Recommendations Therapy as outlined in treatment plan below   Prognosis 05/26/2021 Prognosis for Safe Diet Advancement Good Barriers to Reach Goals Severity of deficits Barriers/Prognosis Comment -- CHL IP DIET RECOMMENDATION 05/26/2021 SLP Diet Recommendations Dysphagia 3 (Mech soft) solids;Nectar thick liquid Liquid Administration via Cup Medication Administration Crushed with puree Compensations Clear throat after each swallow;Multiple dry swallows after each bite/sip Postural Changes --  CHL IP OTHER RECOMMENDATIONS 05/26/2021 Recommended Consults -- Oral Care Recommendations Oral care BID Other Recommendations Order thickener from pharmacy;Prohibited food (jello, ice cream, thin soups);Remove water pitcher;Have oral suction available   CHL IP FOLLOW UP RECOMMENDATIONS 05/26/2021 Follow up Recommendations Inpatient Rehab   CHL IP FREQUENCY AND DURATION  05/26/2021 Speech Therapy Frequency (ACUTE ONLY) min 3x week Treatment Duration 2 weeks      CHL IP ORAL PHASE 05/26/2021 Oral Phase WFL Oral - Pudding Teaspoon -- Oral - Pudding Cup -- Oral - Honey Teaspoon -- Oral - Honey Cup -- Oral - Nectar Teaspoon -- Oral - Nectar Cup -- Oral - Nectar Straw -- Oral - Thin Teaspoon -- Oral - Thin Cup -- Oral - Thin Straw -- Oral - Puree -- Oral - Mech Soft -- Oral - Regular -- Oral - Multi-Consistency -- Oral - Pill -- Oral Phase - Comment --  CHL IP PHARYNGEAL PHASE 05/26/2021 Pharyngeal Phase -- Pharyngeal- Pudding Teaspoon -- Pharyngeal -- Pharyngeal- Pudding Cup -- Pharyngeal -- Pharyngeal- Honey Teaspoon NT Pharyngeal -- Pharyngeal- Honey Cup NT Pharyngeal -- Pharyngeal- Nectar Teaspoon NT Pharyngeal -- Pharyngeal- Nectar Cup Penetration/Aspiration during swallow;Reduced epiglottic inversion Pharyngeal Material does not enter airway;Material enters airway, remains ABOVE vocal cords then ejected out;Material enters airway, passes BELOW cords then ejected out Pharyngeal- Nectar Straw Reduced laryngeal elevation;Penetration/Aspiration during swallow Pharyngeal Material enters airway, passes BELOW cords without attempt by patient to eject out (silent aspiration);Material does not enter airway;Material enters airway, remains ABOVE vocal cords then ejected out Pharyngeal- Thin Teaspoon -- Pharyngeal -- Pharyngeal- Thin Cup Reduced epiglottic inversion;Penetration/Aspiration during swallow Pharyngeal Material does not enter airway;Material enters airway, remains ABOVE vocal cords then ejected out;Material enters airway, passes BELOW cords and not ejected out despite cough attempt by patient Pharyngeal- Thin Straw Penetration/Aspiration during swallow;Reduced epiglottic inversion Pharyngeal Material enters airway, passes BELOW cords without attempt by patient to eject out (silent aspiration) Pharyngeal- Puree Reduced epiglottic inversion;Penetration/Aspiration during swallow  Pharyngeal Material enters airway, remains ABOVE vocal cords then ejected out Pharyngeal- Mechanical Soft Reduced epiglottic inversion Pharyngeal Material does not enter airway Pharyngeal- Regular NT Pharyngeal -- Pharyngeal- Multi-consistency -- Pharyngeal -- Pharyngeal- Pill -- Pharyngeal -- Pharyngeal Comment --  CHL IP CERVICAL ESOPHAGEAL PHASE 05/26/2021 Cervical Esophageal Phase Impaired Pudding Teaspoon -- Pudding Cup -- Honey Teaspoon NT Honey Cup NT Nectar Teaspoon NT Nectar Cup Prominent cricopharyngeal segment;Reduced cricopharyngeal relaxation Nectar Straw Reduced cricopharyngeal relaxation;Prominent cricopharyngeal segment Thin Teaspoon Reduced cricopharyngeal relaxation;Prominent cricopharyngeal segment Thin Cup Prominent cricopharyngeal segment;Reduced cricopharyngeal relaxation Thin Straw Reduced cricopharyngeal relaxation;Prominent cricopharyngeal segment Puree Prominent cricopharyngeal segment;Reduced cricopharyngeal relaxation Mechanical Soft Prominent cricopharyngeal segment;Reduced cricopharyngeal relaxation Regular -- Multi-consistency -- Pill -- Cervical Esophageal Comment -- Mcbride, Hayden 05/26/2021, 1:16 PM    Hayden Anna, MA, CCC-SLP [phone]            No results for input(s): WBC, HGB, HCT, PLT in the last 72 hours.  No results for input(s): NA, K, CL, CO2, GLUCOSE, BUN, CREATININE, CALCIUM in the last 72 hours.   Intake/Output Summary (Last 24 hours) at 05/27/2021 1256 Last data filed at 05/27/2021 0851 Gross per 24 hour  Intake 480 ml  Output 275 ml  Net 205 ml        Physical Exam: Vital Signs Blood pressure 134/86, pulse 76, temperature (!) 97.4 F (36.3 C), temperature source Oral, resp. rate 16, height 5\' 10"  (1.778 m), weight 100.7 kg, SpO2 96 %. Gen: no distress, normal appearing HEENT: oral mucosa pink and moist, NCAT Cardio:  Reg rate Chest: normal effort, normal rate of breathing Abd: soft, non-distended Ext: no edema Psych: pleasant, normal  affect Skin: dry feet, chronic vascular changes--stable to improved in appearance  Neuro: Pt is cognitively appropriate with reasonable insight, memory, and awareness. Mild STM deficits Cranial nerves 2-12 are intact. Stocking glove sensory loss in feet remains. Reflexes are 2+ in all 4's. Fine motor coordination is intact. No tremors. Motor function is grossly 4/5 and   4/5 in lower extremities. Good siting balance. +Tinel's left ulnar groove--ulnar sensory changes-still present.  No ulnar muscle weakness Musculoskeletal: Full ROM, No pain with AROM or PROM in the neck, trunk, or extremities. Posture appropriate      Assessment/Plan: 1. Functional deficits which require 3+ hours per day of interdisciplinary therapy in a comprehensive inpatient rehab setting. Physiatrist is providing close team supervision and 24 hour management of active medical problems listed below. Physiatrist and rehab team continue to assess barriers to discharge/monitor patient progress toward functional and medical goals  Care Tool:  Bathing    Body parts bathed by patient: Face   Body parts bathed by helper: Right lower leg, Left lower leg     Bathing assist Assist Level: Set up assist     Upper Body Dressing/Undressing Upper body dressing   What is the patient wearing?: Pull over shirt    Upper body assist Assist Level: Supervision/Verbal cueing    Lower Body Dressing/Undressing Lower body dressing      What is the patient wearing?: Incontinence brief, Pants     Lower body assist Assist for lower body dressing: Contact Guard/Touching assist     Toileting Toileting    Toileting assist Assist for toileting: Set up assist     Transfers Chair/bed transfer  Transfers assist     Chair/bed transfer assist level: Supervision/Verbal cueing Chair/bed transfer assistive device: Programmer, multimedia   Ambulation assist      Assist level: Supervision/Verbal cueing Assistive  device: Walker-rolling Max distance: >350 ft   Walk 10 feet activity   Assist     Assist level: Supervision/Verbal cueing Assistive device: Walker-rolling   Walk 50 feet activity   Assist    Assist level: Supervision/Verbal cueing Assistive device: Walker-rolling    Walk 150 feet activity   Assist Walk 150 feet activity did not occur: Safety/medical concerns  Assist level: Supervision/Verbal cueing Assistive device: Walker-rolling    Walk 10 feet on uneven surface  activity   Assist     Assist level: Supervision/Verbal cueing Assistive device: Aeronautical engineer Will patient use wheelchair at discharge?: No Type of Wheelchair: Manual    Wheelchair assist level: Supervision/Verbal cueing Max wheelchair distance: 50    Wheelchair 50 feet with 2 turns activity    Assist        Assist Level: Supervision/Verbal cueing   Wheelchair 150 feet activity     Assist          Blood pressure 134/86, pulse 76, temperature (!) 97.4 F (36.3 C), temperature source Oral, resp. rate 16, height 5\' 10"  (1.778 m), weight 100.7 kg, SpO2 96 %.  Medical Problem List and Plan: 1.  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  Patient has completed high-dose penicillin followed by infectious disease             -patient may shower             -ELOS/Goals: 7-10  Days, mod I to supervision  for PT, OT, SLP  Continue PT, OT, and SLP  -Interdisciplinary Team Conference today   -pt may get up to walk with staff if they have time to do so outside of his therapy sessions 2.  Impaired mobility -DVT/anticoagulation: D/c Subcutaneous heparin since ambulating >700 feet             -antiplatelet therapy: N/A 3. Lumbago: Voltaren gel 3 times daily as needed, Tylenol as needed. Add kpad for back ache 4. Mood/insomnia: Provide emotional support  -continue trazodone 50mg  qhs at 9pm helpful              -antipsychotic agents: N/A 5. Neuropsych: This patient is capable of making decisions on his own behalf. 6. Skin/Wound Care: Routine skin checks 7. Hypoalbuminemia  -d/c ensure since he does not like. Replaced with Juven.  8.  Dysphagia.  Dysphagia #3 honey thick liquids.  Tolerating diet             -BUN/Cr look fine  -advance liquids per SLP. I would expect that we could advance him soon 9.  Atrial fibrillation/flutter.  Continue Amiodarone 200 mg twice daily.  Cardiac rate controlled at present 10.  MRSA pneumonia/ID.  Patient completed 7-day course of linezolid as of 05/17/2021  -IS, OOB  -will check with ID on need for f/u prior to d/c or after d/c 11.  Hypertension.  Cardura 2 mg daily.  Monitor with increased mobility  6/11- BP is well controlled- con't regimen 12. Oral Thrush: diflucan 100mg  po x 5 days completed-looks better 13. HOH: hears best through left ear             - hearing aids brought from home             -able to hear fairly well if provider speaks loudly and clearly 14. Leukocytosis: stable at 13k  -no s/s infection--recheck 6/10 15. Left hand/arm numbness  -likely ulnar nerve compression at elbow during ICU stay  -encouraged extension of elbow  -wearing elbow pad  -consider outpt NCS 16. Insomnia Continue trazodone to 100 mg QHS- at 9pm- and monitor   LOS: 8 days A FACE TO FACE EVALUATION WAS PERFORMED  Hayden Mcbride 05/27/2021, 12:56 PM

## 2021-05-27 NOTE — Progress Notes (Signed)
Occupational Therapy Session Note  Patient Details  Name: Hayden Mcbride MRN: [id number] Date of Birth: 08-May-1961  Today's Date: 05/27/2021 OT Individual Time: 9702-6378 OT Individual Time Calculation (min): 56 min    Short Term Goals: Week 1:  OT Short Term Goal 1 (Week 1): LTG=STG 2/2 ELOS  Skilled Therapeutic Interventions/Progress Updates:    Pt greeted semi-reclined in bed and agreeable to OT treatment session. PT declined to shower since he showered yesterday. Pt completed bed mobility mod I, then donned shoes and socks with set-up A. Worked on standing balance/endurance with standing grooming tasks, then pt ambulated to dayroom with RW and supervision. OT administered water per water protocol after brushing teeth. Pt initially with coughing spell on first sip, then did a good job incorporating swallowing strategies per SLP of swallow hard, and taking smaller sips. Pt only with a few coughs and stated he could tell if the water was going to go down the wrong pipe. Worked on balance and functoinal ambulation without RW around cones and in dayroom. Balance and UB there-ex with 4lb dowel rod chest press, bicep curl, straight arm raise, and row. Pt ambulated back to room without RW and min A. Pt left semi-reclined in bed with needs met.    Therapy Documentation Precautions:  Precautions Precautions: Fall Precaution Comments: generalized weakness, delayed processing Restrictions Weight Bearing Restrictions: No Pain: Denies pain   Therapy/Group: Individual Therapy  Valma Cava 05/27/2021, 9:55 AM

## 2021-05-27 NOTE — Progress Notes (Signed)
Patient ID: Hayden Mcbride, male   DOB: 1961-01-04, 60 y.o.   MRN: [id number] Team Conference Report to Patient/Family  Team Conference discussion was reviewed with the patient and caregiver, including goals, any changes in plan of care and target discharge date.  Patient and caregiver express understanding and are in agreement.  The patient has a target discharge date of 05/30/21. overing for primary SW, Auria   SW met with patient, introduced self, provided patient with updates. Patient was aware of extension and is aware. No other questions or concerns.    Dyanne Iha 05/27/2021, 1:56 PM

## 2021-05-28 LAB — GLUCOSE, CAPILLARY
Glucose-Capillary: 101 mg/dL — ABNORMAL HIGH (ref 70–99)
Glucose-Capillary: 140 mg/dL — ABNORMAL HIGH (ref 70–99)
Glucose-Capillary: 125 mg/dL — ABNORMAL HIGH (ref 70–99)
Glucose-Capillary: 95 mg/dL (ref 70–99)

## 2021-05-28 MED ORDER — PANTOPRAZOLE SODIUM 40 MG PO TBEC
40.0000 mg | DELAYED_RELEASE_TABLET | Freq: Every day | ORAL | Status: DC
  Administered 2021-05-28 – 2021-05-30 (×3): 40 mg via ORAL
  Filled 2021-05-28 (×3): qty 1

## 2021-05-28 NOTE — Progress Notes (Signed)
Occupational Therapy Session Note  Patient Details  Name: Hayden Mcbride MRN: [id number] Date of Birth: [birth year]  Today's Date: 05/28/2021 OT Individual Time: 3601-6580 OT Individual Time Calculation (min): 68 min    Short Term Goals: Week 1:  OT Short Term Goal 1 (Week 1): LTG=STG 2/2 ELOS  Skilled Therapeutic Interventions/Progress Updates:     Pt received in bed with no pain indicated.   ADL:  Pt completes shower level BADL with RW ambulating into/out of bathroom with no cuing for BADL tasks but education on gathering items, draping clothing over walker for transport and picking items up off of floor. Pt requires no cuing for safety awareness and demo good strategies for RW management.     Therapeutic exercise OT edu re standing stretches/exercises at wall Wall pushes External rotation stretch at doorway/wall Shoulder flexion glide at all Horizontal ab/adduction at all Shoulder ab/adduct to shoulder height in pain free ROM with scap mob provided   Pt left at end of session in bed with exit alarm on, call light in reach and all needs met   Therapy Documentation Precautions:  Precautions Precautions: Fall Precaution Comments: generalized weakness, delayed processing Restrictions Weight Bearing Restrictions: No General:   Vital Signs: Therapy Vitals Temp: 98.4 F (36.9 C) Temp Source: Oral Pulse Rate: 66 Resp: 18 BP: 100/60 Patient Position (if appropriate): Lying Oxygen Therapy SpO2: 97 % O2 Device: Room Air Pain:   ADL: ADL Eating: Minimal assistance Grooming: Minimal assistance Upper Body Bathing: Minimal assistance Lower Body Bathing: Minimal assistance Upper Body Dressing: Minimal assistance Lower Body Dressing: Minimal assistance, Moderate assistance Toileting: Minimal assistance Toilet Transfer: Minimal assistance Vision   Perception    Praxis   Exercises:   Other Treatments:     Therapy/Group: Individual Therapy  Tonny Branch 05/28/2021, 6:56 AM

## 2021-05-28 NOTE — Discharge Summary (Signed)
Physician Discharge Summary  Patient ID: Hayden Mcbride MRN: [id number] DOB/AGE: 1961-11-03 60 y.o.  Admit date: 05/19/2021 Discharge date: 05/30/2021  Discharge Diagnoses:  Principal Problem:   Pneumococcal meningitis DVT prophylaxis Dysphagia Atrial fibrillation/flutter MRSA pneumonia Hypertension Oral thrush  Discharged Condition: Stable  Significant Diagnostic Studies: DG Abd 1 View  Result Date: 05/11/2021 CLINICAL DATA:  60 year old male status post nasogastric tube placement. EXAM: ABDOMEN - 1 VIEW COMPARISON:  05/09/2021 FINDINGS: Nonspecific bowel gas pattern with mild gaseous distension of the small and large bowel, similar to comparison. Gastric decompression tube is kinked at its proximal side hole in the gastric fundus with the tip oriented toward the gastroesophageal junction. Cholecystectomy clips in the right upper quadrant. No acute osseous abnormality. IMPRESSION: Gastric decompression tube is kinked at its proximal side hole within the gastric fundus. The tip of the catheter is oriented toward the gastroesophageal junction. Continued advancement into the stomach may improve function. Electronically Signed   By: Ruthann Cancer MD   On: 05/11/2021 10:26   DG Abd 1 View  Result Date: 05/09/2021 CLINICAL DATA:  60 year old male with NG placement. EXAM: ABDOMEN - 1 VIEW COMPARISON:  Abdominal radiograph dated 05/02/2021. FINDINGS: Enteric tube with tip in the proximal stomach and side-port in the region of the GE junction. Recommend further advancing of the tube by additional 10 cm. No bowel dilatation noted. Right upper quadrant cholecystectomy clips. IMPRESSION: Enteric tube with tip in the proximal stomach. Recommend further advancing of the tube by additional 10 cm. Electronically Signed   By: Anner Crete M.D.   On: 05/09/2021 18:44   DG Abd 1 View  Result Date: 05/02/2021 CLINICAL DATA:  Gastric catheter placement EXAM: ABDOMEN - 1 VIEW COMPARISON:  None. FINDINGS:  Gastric catheter is noted within the stomach. Scattered large and small bowel gas is noted. No free air is noted. Degenerative change of the lumbar spine is seen. IMPRESSION: Gastric catheter within the stomach. Patient's known PICC line is not visualized on this image due to the image field of view. Electronically Signed   By: Inez Catalina M.D.   On: 05/02/2021 19:05   CT HEAD WO CONTRAST  Result Date: 05/05/2021 CLINICAL DATA:  Bacterial meningitis EXAM: CT HEAD WITHOUT CONTRAST TECHNIQUE: Contiguous axial images were obtained from the base of the skull through the vertex without intravenous contrast. COMPARISON:  Recent MR and CT imaging FINDINGS: There is significant streak artifact through the posterior fossa and skull base. Brain: There is no acute intracranial hemorrhage, mass effect, or edema. Gray-white differentiation is preserved. There is no extra-axial fluid collection. Ventricles and sulci are stable in size and configuration. Vascular: Increased density of some of the dural venous sinuses. Skull: Calvarium is unremarkable. Sinuses/Orbits: No acute finding. Other: None. IMPRESSION: No acute intracranial hemorrhage, acute infarction, extra-axial collection, or hydrocephalus. There is increased density of some of the dural venous sinuses. This more likely reflects hemoconcentration than thrombus. MRV could be considered as indicated. Electronically Signed   By: Macy Mis M.D.   On: 05/05/2021 13:20   CT CHEST WO CONTRAST  Result Date: 05/10/2021 CLINICAL DATA:  Fever of unknown origin. EXAM: CT CHEST WITHOUT CONTRAST TECHNIQUE: Multidetector CT imaging of the chest was performed following the standard protocol without IV contrast. COMPARISON:  Chest radiograph, 05/09/2021 and older studies. FINDINGS: Cardiovascular: Normal sized heart. No pericardial effusion. No coronary artery calcifications. Great vessels normal in caliber. No aortic atherosclerosis. Mediastinum/Nodes: Previous left  thyroidectomy. Right thyroid lobe is enlarged and heterogeneous  consistent with nodules. Either combined nodules or 1 large nodule spans 4.2 cm. No mediastinal or hilar masses or enlarged lymph nodes. Trachea and esophagus are unremarkable. Lungs/Pleura: There is consolidation in the posterior base of the right lower lobe consistent with pneumonia. There is minor atelectasis in the dependent left lower lobe. Focus of calcification in the right middle lobe. Mild scarring at the right apex. Remainder of the lungs is clear. No pleural effusion or pneumothorax. Upper Abdomen: No acute findings. 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 Left adrenal mass, 3.3 cm, average Hounsfield units of 54, nonspecific. Status post cholecystectomy. Musculoskeletal: No fracture or acute finding. No aggressive bone lesion. IMPRESSION: 1. Consolidation in the posterior base of the right lower lobe consistent with pneumonia. 2. No other acute abnormality. 3. Bilateral adrenal masses, the left measuring 3.3 cm, indeterminate, not meeting criteria for an adenoma on unenhanced imaging. Recommend follow-up adrenal MRI for further assessment. Right adrenal myelolipoma, benign. Electronically Signed   By: Lajean Manes M.D.   On: 05/10/2021 10:53   MR BRAIN W WO CONTRAST  Result Date: 05/06/2021 CLINICAL DATA:  Follow-up dural venous thrombosis. Bacterial meningitis. EXAM: MRI HEAD WITHOUT AND WITH CONTRAST TECHNIQUE: Multiplanar, multiecho pulse sequences of the brain and surrounding structures were obtained without and with intravenous contrast. CONTRAST:  36mL GADAVIST GADOBUTROL 1 MMOL/ML IV SOLN COMPARISON:  Head CT yesterday.  MRI 04/30/2021 FINDINGS: Brain: There is a constellation of findings consistent with bacterial meningitis. Diffusion imaging does not show any restricted diffusion affecting the brain parenchyma to indicate infarction. Numerous septations/synechiae are noted within the subarachnoid spaces, and there scattered discrete  foci of restricted diffusion within the subarachnoid spaces consistent with bacterial meningitis. These appear progressive compared to the study of 6 days ago. No evidence of ventriculitis of the lateral or third ventricles. Some restricted diffusion material present in the fourth ventricle. No evidence change in ventricular size. Small focus of hemosiderin deposition in the left temporoparietal junction region is unchanged, possibly pre-existing the current infection. After contrast administration, there is a remarkable absence of abnormal contrast enhancement of the leptomeninges. Vascular: Major vessels at the base of the brain show flow. Postcontrast imaging does not show evidence of venous thrombosis. Skull and upper cervical spine: No primary bone pathology. Sinuses/Orbits: Paranasal sinuses are clear. Fluid in left mastoid region and middle ear, similar to the previous exam. Other: None IMPRESSION: Findings consistent with the clinical diagnosis of bacterial meningitis. Since the previous study, the patient has developed more numerous foci of restricted diffusion scattered about the subarachnoid spaces, with areas of septation and synechiae formation consistent with ongoing bacterial meningitis. Small amount of restricted diffusion material present in the fourth ventricle, but no evidence of widespread ventriculitis. No sign of venous thrombosis. No sign of brain parenchymal infarction. No change in ventricular size. Remarkable absence of abnormal leptomeningeal contrast enhancement. Electronically Signed   By: Nelson Chimes M.D.   On: 05/06/2021 17:23   MR Venogram Head  Result Date: 05/06/2021 CLINICAL DATA:  Bacterial meningitis.  Assess for venous thrombosis. EXAM: MR VENOGRAM HEAD WITHOUT AND WITH CONTRAST TECHNIQUE: Angiographic images of the intracranial venous structures were acquired using MRV technique without intravenous contrast. COMPARISON:  MRI brain same day.  Head CT yesterday. FINDINGS: No  evidence intracranial venous thrombosis. Dominant right transverse sinus and jugular system, with a diminutive left transverse sinus, sigmoid sinus and jugular vein, but without evidence of thrombosis, particularly when correlated with the postcontrast images from the brain MRI same day. IMPRESSION:  No intracranial venous thrombosis. The patient shows dominance of the right transverse sinus and jugular system. The left transverse sinus is diminutive, but does not show evidence of thrombosis, particularly given the postcontrast imaging. Electronically Signed   By: Nelson Chimes M.D.   On: 05/06/2021 17:27   DG CHEST PORT 1 VIEW  Result Date: 05/12/2021 CLINICAL DATA:  60 year old male with NG coming up. EXAM: PORTABLE CHEST 1 VIEW COMPARISON:  Chest radiograph dated 05/11/2021. FINDINGS: No enteric tube identified. Right-sided PICC with tip close to the cavoatrial junction. Faint bilateral densities, likely atelectasis. Atypical infection is not excluded. No focal consolidation, pleural effusion, or pneumothorax. Stable cardiac silhouette. No acute osseous pathology. Postsurgical changes of left thyroidectomy. There is deviation of the upper trachea to the left secondary to right thyroid nodule seen on the CT of 05/10/2021. IMPRESSION: 1. No enteric tube identified. 2. Faint bilateral pulmonary densities. Electronically Signed   By: Anner Crete M.D.   On: 05/12/2021 15:51   DG Chest Port 1 View  Result Date: 05/11/2021 CLINICAL DATA:  Atelectasis EXAM: PORTABLE CHEST 1 VIEW COMPARISON:  05/09/2021 FINDINGS: Lungs are clear.  No pleural effusion or pneumothorax. The heart is top-normal in size. Right arm PICC terminates in the mid SVC. Surgical clips along the left neck/mediastinum. IMPRESSION: No evidence of acute cardiopulmonary disease. Right arm PICC terminates in the mid SVC. Electronically Signed   By: Julian Hy M.D.   On: 05/11/2021 08:17   DG Chest Port 1 View  Result Date:  05/09/2021 CLINICAL DATA:  Acute respiratory failure, aspiration EXAM: PORTABLE CHEST 1 VIEW COMPARISON:  05/07/2021 FINDINGS: Single frontal view of the chest was obtained with the patient rotated toward the left. Endotracheal and enteric catheter seen previously have been removed. Cardiac silhouette is stable. Interval development of patchy right basilar consolidation which could reflect aspiration given clinical history. No effusion or pneumothorax. No acute bony abnormalities. IMPRESSION: 1. Interval development of patchy right basilar consolidation, which could reflect aspiration given clinical history. Electronically Signed   By: Randa Ngo M.D.   On: 05/09/2021 21:03   DG Chest Port 1 View  Result Date: 05/07/2021 CLINICAL DATA:  Acute respiratory failure.  Hypoxia EXAM: PORTABLE CHEST 1 VIEW COMPARISON:  Chest x-ray 05/06/2021 FINDINGS: Enteric tube with tip terminating 6.3 cm above the carina. Enteric tube coursing below the hemidiaphragm with tip overlying the gastric lumen and side port not definitely identified. Right PICC not well visualized. The heart size and mediastinal contours are within normal limits. Right lower lung zone opacity. Left base nodular density. No pulmonary edema. No pleural effusion. No pneumothorax. No acute osseous abnormality. IMPRESSION: 1. Bilateral lower lobe nodular-like opacities. 2. Enteric tube with tip terminating 6.3 cm above the carina. 3. Enteric tube coursing below the hemidiaphragm with tip overlying the gastric lumen and side port not definitely identified. 4. Right PICC not well visualized. Electronically Signed   By: Iven Finn M.D.   On: 05/07/2021 04:31   DG Chest Port 1 View  Result Date: 05/06/2021 CLINICAL DATA:  Respiratory failure.  Hypoxia. EXAM: PORTABLE CHEST 1 VIEW COMPARISON:  05/04/2021. FINDINGS: Endotracheal tube, right PICC line in stable position. NG tube tip below left hemidiaphragm. NG tube tip not imaged. Heart size normal. Low  lung volumes with mild right base subsegmental atelectasis. No pleural effusion or pneumothorax. IMPRESSION: 1. Endotracheal tube right PICC line stable position. NG tube tip below left hemidiaphragm. NG tube tip not imaged. 2.  Low lung volumes with mild  right base subsegmental atelectasis. Electronically Signed   By: Marcello Moores  Register   On: 05/06/2021 06:23   DG Chest Port 1 View  Result Date: 05/04/2021 CLINICAL DATA:  Acute respiratory failure EXAM: PORTABLE CHEST 1 VIEW COMPARISON:  May 02, 2021 FINDINGS: An NG tube terminates in the region of the proximal duodenum. A right PICC line terminates near the caval atrial junction. An ETT is in good position. No pneumothorax. Stable cardiomegaly. The hila and mediastinum are unchanged. Mild atelectasis in the left base. No overt edema or focal infiltrate. No nodule or mass. IMPRESSION: 1. The NG tube terminates in the right side of the abdomen, probably in the proximal right duodenum. Consider withdrawing 5 or 6 cm. 2. Other support apparatus as above. 3. No other acute abnormalities. Electronically Signed   By: Dorise Bullion III M.D   On: 05/04/2021 07:08   DG CHEST PORT 1 VIEW  Result Date: 05/02/2021 CLINICAL DATA:  PICC placement. EXAM: PORTABLE CHEST 1 VIEW COMPARISON:  Same day. FINDINGS: Stable cardiomediastinal silhouette. Endotracheal nasogastric tubes are unchanged in position. Bibasilar atelectasis is noted with probable small left pleural effusion. No pneumothorax is noted. Right-sided PICC line is noted with tip in expected position of the SVC. Bony thorax is unremarkable. IMPRESSION: Right-sided PICC line with distal tip in expected position of the SVC. Stable support apparatus. Stable bibasilar atelectasis is noted with probable small left pleural effusion. Electronically Signed   By: Marijo Conception M.D.   On: 05/02/2021 20:34   DG CHEST PORT 1 VIEW  Result Date: 05/02/2021 CLINICAL DATA:  Central line placement. EXAM: PORTABLE CHEST 1  VIEW COMPARISON:  Apr 29, 2021. FINDINGS: Stable cardiomediastinal silhouette. Endotracheal and nasogastric tubes are unchanged in position. No pneumothorax is noted. Right-sided PICC line is noted, although its distal tip cannot be visualized due to overlying cardiac shadow. Bibasilar atelectasis is noted. Small left pleural effusion may be present. Bony thorax is unremarkable. IMPRESSION: Interval placement of right-sided PICC line, although distal tip cannot be visualized due to overlying cardiac shadow. Stable support apparatus. Bibasilar subsegmental atelectasis. Electronically Signed   By: Marijo Conception M.D.   On: 05/02/2021 17:12   DG Abd Portable 1V  Result Date: 05/13/2021 CLINICAL DATA:  Feeding tube placement. EXAM: PORTABLE ABDOMEN - 1 VIEW COMPARISON:  Chest radiograph 05/12/2021 FINDINGS: A feeding tube has been placed and terminates in the expected region of the mid gastric body. A right PICC remains in place. Lung volumes are mildly low without evidence of airspace consolidation, edema, a sizable pleural effusion, or pneumothorax. Only the uppermost portion of the abdomen was included on this study. IMPRESSION: Feeding tube placement as above. Electronically Signed   By: Logan Bores M.D.   On: 05/13/2021 11:15   DG Abd Portable 1V  Result Date: 04/30/2021 CLINICAL DATA:  Check gastric catheter placement EXAM: PORTABLE ABDOMEN - 1 VIEW COMPARISON:  None FINDINGS: Gastric catheter is noted in the distal stomach in satisfactory position. Scattered mildly dilated loops of small bowel are noted. IMPRESSION: Gastric catheter as described. Findings consistent with mild small bowel dilatation. Electronically Signed   By: Inez Catalina M.D.   On: 04/30/2021 18:13   DG Swallowing Func-Speech Pathology  Result Date: 05/26/2021 Formatting of this result is different from the original. Objective Swallowing Evaluation: Type of Study: MBS-Modified Barium Swallow Study  Patient Details Name: Hayden Mcbride MRN: [id number] Date of Birth: [birth year] Today's Date: 05/26/2021 Past Medical History: Past Medical History: Diagnosis Date  Allergy   Dry eyes   GERD (gastroesophageal reflux disease)   Glaucoma   Hx of adenomatous colonic polyps 07/08/2006  06/2006 - diminutive adenoma 04/19/2015 - diminutive adenoma and one polyp lost - repeat colonoscopy 2021  Hypertension   Post-operative nausea and vomiting   Sleep apnea   wears CPAP  Thyroid disease   long ago- half thyroid removed ~20 yrs ago  Past Surgical History: Past Surgical History: Procedure Laterality Date  BREATH TEK H PYLORI N/A 12/24/2014  Procedure: BREATH TEK H PYLORI;  Surgeon: Alphonsa Overall, MD;  Location: [facility];  Service: General;  Laterality: N/A;  CHOLECYSTECTOMY    COLONOSCOPY    EYE SURGERY    age 73   INNER EAR SURGERY    tumor inside right ear  POLYPECTOMY    SHOULDER SURGERY    left shoulder  THYROID SURGERY   HPI: see H&P  Subjective: Pt awake, alert, pleasant, participative Assessment / Plan / Recommendation CHL IP CLINICAL IMPRESSIONS 05/26/2021 Clinical Impression Patient continues to demonstrate a moderate dysphagia.  Patient has a bony protrusion at the level of C3-C4 that impedes into his pharynx resulting in poor epiglottic inversion.  This results in inconsistent and intermittent penetration and aspiration of both nectar-thick and thin liquids.  Mild residuals are also noted due to reduced cricopharyngeal relaxation and prominent cricopharyngeus.  Postural changes were ineffective and reducing penetration/aspiration and appeared to increase aspiration.  Patient had silent aspiration with small amounts but did sense larger amounts of aspirates. Cued throat clear/cough was effective in removing penetrates and majority of aspirates.  Initially was going to upgrade patient to thin liquids, however, at the bedside after the MBS, patient with consistent overt s/s of aspiration of thin via cup. S/s of aspiration were reduced with nectar-thick  liquids with patient reports of swallowing feeling "more comfortable" with nectar-thick liquids.  Recommend patient continue Dys. 3 textures and upgrade to nectar-thick liquids with initiation of the water protocol. Patient and wife verbalized understanding and agreement. SLP with f/u for tolerance. SLP Visit Diagnosis -- Attention and concentration deficit following -- Frontal lobe and executive function deficit following -- Impact on safety and function --   CHL IP TREATMENT RECOMMENDATION 05/26/2021 Treatment Recommendations Therapy as outlined in treatment plan below   Prognosis 05/26/2021 Prognosis for Safe Diet Advancement Good Barriers to Reach Goals Severity of deficits Barriers/Prognosis Comment -- CHL IP DIET RECOMMENDATION 05/26/2021 SLP Diet Recommendations Dysphagia 3 (Mech soft) solids;Nectar thick liquid Liquid Administration via Cup Medication Administration Crushed with puree Compensations Clear throat after each swallow;Multiple dry swallows after each bite/sip Postural Changes --   CHL IP OTHER RECOMMENDATIONS 05/26/2021 Recommended Consults -- Oral Care Recommendations Oral care BID Other Recommendations Order thickener from pharmacy;Prohibited food (jello, ice cream, thin soups);Remove water pitcher;Have oral suction available   CHL IP FOLLOW UP RECOMMENDATIONS 05/26/2021 Follow up Recommendations Inpatient Rehab   CHL IP FREQUENCY AND DURATION 05/26/2021 Speech Therapy Frequency (ACUTE ONLY) min 3x week Treatment Duration 2 weeks      CHL IP ORAL PHASE 05/26/2021 Oral Phase WFL Oral - Pudding Teaspoon -- Oral - Pudding Cup -- Oral - Honey Teaspoon -- Oral - Honey Cup -- Oral - Nectar Teaspoon -- Oral - Nectar Cup -- Oral - Nectar Straw -- Oral - Thin Teaspoon -- Oral - Thin Cup -- Oral - Thin Straw -- Oral - Puree -- Oral - Mech Soft -- Oral - Regular -- Oral - Multi-Consistency -- Oral - Pill -- Oral Phase -  Comment --  CHL IP PHARYNGEAL PHASE 05/26/2021 Pharyngeal Phase -- Pharyngeal- Pudding  Teaspoon -- Pharyngeal -- Pharyngeal- Pudding Cup -- Pharyngeal -- Pharyngeal- Honey Teaspoon NT Pharyngeal -- Pharyngeal- Honey Cup NT Pharyngeal -- Pharyngeal- Nectar Teaspoon NT Pharyngeal -- Pharyngeal- Nectar Cup Penetration/Aspiration during swallow;Reduced epiglottic inversion Pharyngeal Material does not enter airway;Material enters airway, remains ABOVE vocal cords then ejected out;Material enters airway, passes BELOW cords then ejected out Pharyngeal- Nectar Straw Reduced laryngeal elevation;Penetration/Aspiration during swallow Pharyngeal Material enters airway, passes BELOW cords without attempt by patient to eject out (silent aspiration);Material does not enter airway;Material enters airway, remains ABOVE vocal cords then ejected out Pharyngeal- Thin Teaspoon -- Pharyngeal -- Pharyngeal- Thin Cup Reduced epiglottic inversion;Penetration/Aspiration during swallow Pharyngeal Material does not enter airway;Material enters airway, remains ABOVE vocal cords then ejected out;Material enters airway, passes BELOW cords and not ejected out despite cough attempt by patient Pharyngeal- Thin Straw Penetration/Aspiration during swallow;Reduced epiglottic inversion Pharyngeal Material enters airway, passes BELOW cords without attempt by patient to eject out (silent aspiration) Pharyngeal- Puree Reduced epiglottic inversion;Penetration/Aspiration during swallow Pharyngeal Material enters airway, remains ABOVE vocal cords then ejected out Pharyngeal- Mechanical Soft Reduced epiglottic inversion Pharyngeal Material does not enter airway Pharyngeal- Regular NT Pharyngeal -- Pharyngeal- Multi-consistency -- Pharyngeal -- Pharyngeal- Pill -- Pharyngeal -- Pharyngeal Comment --  CHL IP CERVICAL ESOPHAGEAL PHASE 05/26/2021 Cervical Esophageal Phase Impaired Pudding Teaspoon -- Pudding Cup -- Honey Teaspoon NT Honey Cup NT Nectar Teaspoon NT Nectar Cup Prominent cricopharyngeal segment;Reduced cricopharyngeal relaxation Nectar  Straw Reduced cricopharyngeal relaxation;Prominent cricopharyngeal segment Thin Teaspoon Reduced cricopharyngeal relaxation;Prominent cricopharyngeal segment Thin Cup Prominent cricopharyngeal segment;Reduced cricopharyngeal relaxation Thin Straw Reduced cricopharyngeal relaxation;Prominent cricopharyngeal segment Puree Prominent cricopharyngeal segment;Reduced cricopharyngeal relaxation Mechanical Soft Prominent cricopharyngeal segment;Reduced cricopharyngeal relaxation Regular -- Multi-consistency -- Pill -- Cervical Esophageal Comment -- PAYNE, COURTNEY 05/26/2021, 1:16 PM    Weston Anna, MA, CCC-SLP [phone]           EEG adult  Result Date: 04/30/2021 Lora Havens, MD     04/30/2021 11:12 AM Patient Name: Hayden Mcbride MRN: [id number] Epilepsy Attending: Lora Havens Referring Physician/Provider: Dr Amie [place] Date: 04/30/2021 Duration: 24.13 mins Patient history: 60 year old with sudden onset of altered mental status with a preceding headache. EEG to evaluate for seizure. Level of alertness: comatose AEDs during EEG study: None Technical aspects: This EEG study was done with scalp electrodes positioned according to the 10-20 International system of electrode placement. Electrical activity was acquired at a sampling rate of 500Hz  and reviewed with a high frequency filter of 70Hz  and a low frequency filter of 1Hz . EEG data were recorded continuously and digitally stored. Description: EEG showed continuous generalized and lateralized right hemisphere 3 to 6 Hz theta-delta slowing. Hyperventilation and photic stimulation were not performed.   ABNORMALITY - Continuous slow, generalized and lateralized right hemisphere IMPRESSION: This study is suggestive of cortical dysfunction arising from right hemisphere region likely secondary to underlying structural abnormalitywithin normal limits. Additionally, there is moderate diffuse encephalopathy, non specific etiology. No seizures or epileptiform  discharges were seen throughout the recording. Priyanka [name]   Overnight EEG with video  Result Date: 05/08/2021 Lora Havens, MD     05/08/2021  4:39 PM Patient Name: Hayden Mcbride MRN: [id number] Epilepsy Attending: Lora Havens Referring Physician/Provider: Dr Kathrynn Speed Duration:  05/07/2021 1532 to 05/09/2019 1009   Patient history: 60 year old with strep pneumo meningitis. EEG to evaluate for seizure.   Level of alertness: awake, asleep  AEDs during EEG study: None   Technical aspects: This EEG study was done with scalp electrodes positioned according to the 10-20 International system of electrode placement. Electrical activity was acquired at a sampling rate of 500Hz  and reviewed with a high frequency filter of 70Hz  and a low frequency filter of 1Hz . EEG data were recorded continuously and digitally stored.   Description: No clear posterior dominant rhythm was seen. EEG showed continuous generalized polymorphic mixed frequencies with predominantly 5 to 9 Hz theta and alpha activity as well as intermittent generalized 2 to 3 Hz delta slowing, at times with triphasic morphology.  Hyperventilation and photic stimulation were not performed.     ABNORMALITY - Continuous slow, generalized   IMPRESSION: This study is suggestive of moderate diffuse encephalopathy, non specific etiology. No seizures or definite epileptiform discharges were seen throughout the recording.   Lora Havens   ECHOCARDIOGRAM COMPLETE  Result Date: 05/09/2021    ECHOCARDIOGRAM REPORT   Patient Name:   Hayden Mcbride Date of Exam: 05/09/2021 Medical Rec #:  [id number]       Height:       71.0 in Accession #:    [id number]      Weight:       247.1 lb Date of Birth:  10/09/1961        BSA:          2.307 m Patient Age:    67 years        BP:           141/53 mmHg Patient Gender: M               HR:           82 bpm. Exam Location:  Inpatient Procedure: 2D Echo, Cardiac Doppler and Color Doppler Indications:    Fever  R50.9  History:        Patient has prior history of Echocardiogram examinations, most                 recent 03/19/2015. Arrythmias:Atrial Fibrillation; Risk                 Factors:Hypertension and Sleep Apnea. GERD, thyroid disease.  Sonographer:    Darlina Sicilian RDCS Referring Phys: [place]  1. Left ventricular ejection fraction, by estimation, is 65 to 70%. The left ventricle has normal function. The left ventricle has no regional wall motion abnormalities. Left ventricular diastolic function could not be evaluated.  2. Right ventricular systolic function is normal. The right ventricular size is normal. Tricuspid regurgitation signal is inadequate for assessing PA pressure.  3. The mitral valve is grossly normal. No evidence of mitral valve regurgitation. No evidence of mitral stenosis.  4. The aortic valve is tricuspid. Aortic valve regurgitation is not visualized. No aortic stenosis is present.  5. The inferior vena cava is normal in size with greater than 50% respiratory variability, suggesting right atrial pressure of 3 mmHg. Conclusion(s)/Recommendation(s): No evidence of valvular vegetations on this transthoracic echocardiogram. Would recommend a transesophageal echocardiogram to exclude infective endocarditis if clinically indicated. FINDINGS  Left Ventricle: Left ventricular ejection fraction, by estimation, is 65 to 70%. The left ventricle has normal function. The left ventricle has no regional wall motion abnormalities. The left ventricular internal cavity size was normal in size. There is  no left ventricular hypertrophy. Left ventricular diastolic function could not be evaluated due to atrial fibrillation. Left ventricular diastolic function could not be evaluated.  Right Ventricle: The right ventricular size is normal. No increase in right ventricular wall thickness. Right ventricular systolic function is normal. Tricuspid regurgitation signal is inadequate for assessing  PA pressure. Left Atrium: Left atrial size was normal in size. Right Atrium: Right atrial size was normal in size. Pericardium: Trivial pericardial effusion is present. Presence of pericardial fat pad. Mitral Valve: The mitral valve is grossly normal. No evidence of mitral valve regurgitation. No evidence of mitral valve stenosis. Tricuspid Valve: The tricuspid valve is grossly normal. Tricuspid valve regurgitation is not demonstrated. No evidence of tricuspid stenosis. Aortic Valve: The aortic valve is tricuspid. Aortic valve regurgitation is not visualized. No aortic stenosis is present. Pulmonic Valve: The pulmonic valve was grossly normal. Pulmonic valve regurgitation is not visualized. No evidence of pulmonic stenosis. Aorta: The aortic root and ascending aorta are structurally normal, with no evidence of dilitation. Venous: The inferior vena cava is normal in size with greater than 50% respiratory variability, suggesting right atrial pressure of 3 mmHg. IAS/Shunts: The atrial septum is grossly normal.  LEFT VENTRICLE PLAX 2D LVIDd:         5.20 cm LVIDs:         3.50 cm LV PW:         1.10 cm LV IVS:        1.15 cm LVOT diam:     2.20 cm LVOT Area:     3.80 cm  RIGHT VENTRICLE RV S prime:     11.30 cm/s TAPSE (M-mode): 1.0 cm LEFT ATRIUM           Index LA diam:      3.80 cm 1.65 cm/m LA Vol (A4C): 35.3 ml 15.30 ml/m   AORTA Ao Root diam: 3.80 cm Ao Asc diam:  3.20 cm MITRAL VALVE MV Area (PHT): 2.58 cm    SHUNTS MV Decel Time: 294 msec    Systemic Diam: 2.20 cm MV E velocity: 57.00 cm/s Eleonore Chiquito MD Electronically signed by Eleonore Chiquito MD Signature Date/Time: 05/09/2021/4:55:50 PM    Final    DG HIP UNILAT WITH PELVIS 2-3 VIEWS LEFT  Result Date: 05/19/2021 CLINICAL DATA:  Left hip pain after a fall.  Initial encounter. EXAM: DG HIP (WITH OR WITHOUT PELVIS) 2-3V LEFT COMPARISON:  None. FINDINGS: There is no evidence of hip fracture or dislocation. There is no evidence of arthropathy or other focal  bone abnormality. Contrast in the colon from a barium swallow 05/16/2021 noted. IMPRESSION: Negative exam. Electronically Signed   By: Inge Rise M.D.   On: 05/19/2021 11:58   [place] EKG SITE RITE  Result Date: 05/02/2021 If Site Rite image not attached, placement could not be confirmed due to current cardiac rhythm.  US Abdomen Limited RUQ (LIVER/GB)  Result Date: 05/14/2021 CLINICAL DATA:  60 year old male with elevated LFTs. EXAM: ULTRASOUND ABDOMEN LIMITED RIGHT UPPER QUADRANT COMPARISON:  None. FINDINGS: Gallbladder: Cholecystectomy. Common bile duct: Diameter: 5 mm Liver: No focal lesion identified. Within normal limits in parenchymal echogenicity. Portal vein is patent on color Doppler imaging with normal direction of blood flow towards the liver. Other: None. IMPRESSION: Cholecystectomy, otherwise unremarkable right upper quadrant ultrasound. Electronically Signed   By: Anner Crete M.D.   On: 05/14/2021 23:49    Labs:  Basic Metabolic Panel: No results for input(s): NA, K, CL, CO2, GLUCOSE, BUN, CREATININE, CALCIUM, MG, PHOS in the last 168 hours.   CBC: No results for input(s): WBC, NEUTROABS, HGB, HCT, MCV, PLT in the last 168 hours.  CBG: Recent Labs  Lab 05/28/21 2052 05/29/21 0618 05/29/21 1157 05/29/21 1619 05/29/21 2109  GLUCAP 95 100* 112* 124* 109*   Family history.  Mother with diverticulitis.  Father with colon cancer.  Negative esophageal cancer stomach cancer rectal cancer  Brief HPI:   TERRION GENCARELLI is a 60 y.o. right-handed male with history of hypertension, glaucoma hearing loss reports of recent visit to [place].  Per chart review lives with spouse independent prior to admission.  Works as a Corporate investment banker.  Admitted 04/29/2021 with altered mental status changes difficulties with lethargy headache right gaze preference and became progressively agitated in the ED requiring intubation for airway protection.  MRI of the brain showed diffuse leptomeningeal  enhancement and abnormal FLAIR signal throughout subarachnoid spaces compatible with acute meningitis as well as punctate DWI foci along the midline parieto-occipital foci with question of trace purulence.  CTA head and neck negative for LVO or high-grade stenosis and showed incidental 4.7 cm right thyroid nodule.  LP completed showing marked xanthochroma with elevated opening pressure consistent with encephalitis and was started on acyclovir as well as broad-spectrum antibiotics.  CSF revealed neutrophilic pleocytosis with WBC 585 protein greater than 600 glucose less than 20 and gram stain showed few Streptococcus pneumonia.  Blood cultures x2 negative.  EEG without evidence of seizure.  Antibiotics narrowed to high-dose penicillin per infectious disease.  Patient continued to have increased lethargy decrease in movement disconjugate gaze as well as decreased ability to follow commands.  He was noted to have decreasing movement of left lower extremity.  Neurology recommended repeat CT of the head 05/05/2021 which showed increased density in some of the dural venous sinus question hemoconcentration versus thrombus.  MRI/MRV brain repeated showing changes consistent with bacterial meningitis and increase in numerous foci of restricted diffusion about subarachnoid spaces with areas of septation and synechie formation consistent with bacterial meningitis, no evidence of bacterial ventriculitis or venous thrombosis.  He developed fevers with leukocytosis and sputum cultures positive for MRSA.  He was started on linezolid and tolerated extubation.  CTA chest 528 showed consolidation at the posterior base right lower lobe consistent with pneumonia as well as incidental bilateral adrenal masses with recommendations of adrenal MRI for further assessment.  Echocardiogram showed ejection fraction 65 to 70% no valve abnormality.  He developed A. fib with flutter felt to be due to stress from underlying medical issues initially  started on Eliquis as well as amiodarone for rate control.  Initially on cortrak feeding supplement for electrolyte abnormalities treated with D5W as well as potassium magnesium replacement his diet slowly advanced to a mechanical soft honey thick liquids.  Therapy evaluations completed due to patient decreased functional ability cognitive deficits was admitted for a comprehensive rehab program.   Hospital Course: Hayden Mcbride was admitted to rehab 05/19/2021 for inpatient therapies to consist of PT, ST and OT at least three hours five days a week. Past admission physiatrist, therapy team and rehab RN have worked together to provide customized collaborative inpatient rehab.  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  Patient had completed high-dose course of penicillin followed by infectious disease remained afebrile.  Subcutaneous heparin for DVT prophylaxis later discontinued as patient ambulating greater than 700 feet.  Patient did have hospital course of atrial fibrillation initially on amiodarone as well as Eliquis which was later discontinued and cardiac rate controlled no further anticoagulation due to bacterial meningitis and high risk of ICH.  He  was currently maintained on dysphagia #3 nectar thick liquid diet.  Bouts of insomnia maintained on trazodone.  Blood pressure controlled on Cardura.   Blood pressures were monitored on TID basis and controlled     Rehab course: During patient's stay in rehab weekly team conferences were held to monitor patient's progress, set goals and discuss barriers to discharge. At admission, patient required minimal assist sit to stand minimal assist stand pivot transfers moderate assist squat pivot transfers minimal assist ambulate 6 feet rolling walker max assist upper body bathing max assist lower body bathing max is upper body dressing max assist lower body dressing  Physical exam.  Blood  pressure 132/84 pulse 96 temperature 98.5 respirations 18 oxygen saturation 1% room air Constitutional.  No acute distress HEENT Head.  Normocephalic and atraumatic Eyes.  Pupils round and reactive to light Neck.  Supple nontender no JVD without thyromegaly Cardiac regular rate rhythm not extra sounds or murmur heard Abdomen.  Soft nontender positive bowel sounds without rebound Respiratory effort normal no respiratory distress without wheeze Skin.  Dry flaky skin chronic stasis changes vasculitic changes bilateral feet Neurologic.  Alert no acute distress dysarthric speech but intelligible.  Oriented to person place and date but not time.  Limited medical historian.  Follows commands.  Hard of hearing right greater than left.  Motor 4+/5 bilateral upper extremities, 3/5 proximal bilateral lower extremities to 4 -/5 distally.  Decreased light touch in stocking glove distribution both feet.  He/She  has had improvement in activity tolerance, balance, postural control as well as ability to compensate for deficits. He/She has had improvement in functional use RUE/LUE  and RLE/LLE as well as improvement in awareness.  Sessions focused on functional mobility functional activity intolerance.  Patient walks in his room today room with rolling walker supervision.  He can gather belongings for activities day living and homemaking.  Speech therapy follow-up on restrictions related to dysphagia.  Full family teaching was completed plan discharged home       Disposition: Discharged to home    Diet: Mechanical soft nectar liquids  Special Instructions: No driving smoking or alcohol  Medications at discharge. 1.  Tylenol as needed 2.  Amiodarone 200 mg twice daily 3.  Voltaren 2 g 3 times daily as needed 4.  Cardura 2 mg p.o. daily 5.  Multivitamin daily 6.  Trazodone 100 mg p.o. nightly as needed  30-35 minutes were spent completing discharge summary and discharge planning  Discharge  Instructions     Ambulatory referral to Neurology   Complete by: As directed    An appointment is requested in approximately 4 weeks pneumococcal meningitis        Follow-up Information     Meredith Staggers, MD Follow up.   Specialty: Physical Medicine and Rehabilitation Why: No follow up needed Contact information: [place] [address] [phone]         Tommy Medal, Lavell Islam, MD Follow up.   Specialty: Infectious Diseases Why: Only as needed Contact information: [address] [address] [phone]                 Signed: Lavon Paganini Onyx 05/30/2021, 5:28 AM

## 2021-05-28 NOTE — Progress Notes (Signed)
Physical Therapy Session Note  Patient Details  Name: Hayden Mcbride MRN: [id number] Date of Birth: 04-12-1961  Today's Date: 05/28/2021 PT Individual Time: 2774-1287 867-672 PT Individual Time Calculation (min): 65 min and 60 min  Short Term Goals: Week 1:  PT Short Term Goal 1 (Week 1): STGs=LTGs Week 2:    Week 3:     Skilled Therapeutic Interventions/Progress Updates:   AM SESSION: Pain:  Pt reports no pain during session, but states L hip  pain occurs w/overexertion.  Treatment to tolerance.  Rest breaks and repositioning as needed.  Pt initially handed off by NT to PT and agreeable to treatment session w/focus on functional strengthening, floor transfers.  Pt Sit to stand w/supervision and gait >267ft w/RW and close supervision.  Pt instructed w/floor transfers, therapist demonstrated safe sequencing and technique. Mat to floor and floor to mat w/cga and cues, additional time. Repeated x 2, no cues required w/second practice effort.  Pt demonstrated safety w/transfer. Discussed and demonstrated use of armchair for instances when stable furniture not avialable, second person for stabilizing chair.  Stairs: Ascended/descended 8 stairs w/2 rails step over step to ascend, step to to descend, cga.  UE strengthening: Suprapsinatus strengthing w/1lb resistance 2x10 Wall pushups 2x15  Gait >228ft w/RW w/supervision.   Pt left oob in recliner w/chair alarm set and needs in reach.  PM SESSION: PAIN denies pain this pm  Pt initially oob in recliner.  Sit to stand and gait x 173ft w/RW and supervision. Pt had requested this therapist consolidate and update HEP.  Therapist developed, printed, and educated pt re: accessing HEP via [name].  See below.  Pt performed all exercises during session as described below.  Reviewed severAL DIFFERENT options for each mm group and exercises below were best options.  Access Code: [email or web address] URL: [email or web address] Date:  05/28/2021 Prepared by: Callie Fielding  Exercises Seated Hamstring Curl with Anchored Resistance - 1 x daily - 7 x weekly - 2 sets - 15 reps Ankle Dorsiflexion with Resistance - 1 x daily - 7 x weekly - 3 sets - 10 reps Scaption with Dumbbells - 1 x daily - 7 x weekly - 2 sets - 10 reps Wall Push Up - 1 x daily - 7 x weekly - 2-3 sets - 10 reps Side Stepping with Resistance at Feet - 1 x daily - 7 x weekly - 2 sets - 15 reps Sit to Stand with Counter Support - 1 x daily - 7 x weekly - 2 sets - 10 reps Also included handwritten postural strengthening via  facing wall, arms at 90/90 scapular retraction w/shoulder ER.  Gait 160ft w/RW to room, turn/sit to bed w/supervision.  Pt removes shoes independently and returns supine mod I using rail. Pt left supine w/rails up x 3, alarm set, bed in lowest position, and needs in reach.  Therapy Documentation Precautions:  Precautions Precautions: Fall Precaution Comments: generalized weakness, delayed processing Restrictions Weight Bearing Restrictions: No    Therapy/Group: Individual Therapy Callie Fielding, [place] 05/28/2021, 4:40 PM

## 2021-05-28 NOTE — Progress Notes (Signed)
Speech Language Pathology Daily Session Note  Patient Details  Name: JAHMEL FLANNAGAN MRN: [id number] Date of Birth: 04-27-1961  Today's Date: 05/28/2021 SLP Individual Time: 0720-0805 SLP Individual Time Calculation (min): 45 min  Short Term Goals: Week 1: SLP Short Term Goal 1 (Week 1): STGs=LTGs due to ELOS  Skilled Therapeutic Interventions: Skilled treatment session focused on dysphagia goals. SLP facilitated session by providing skilled observation with breakfast meal of Dys. 3 textures and nectar-thick liquids for ongoing assessment of tolerance of recent upgrade. Patient consumed meal with minimal throat clearing that appeared to have decreased compared to yesterday's session with no large coughing episodes observed. Patient was overall Mod I for use of swallowing compensatory strategies. Recommend patient continue current diet. Patient with excellent understanding of current swallowing deficits and recommendations and provided SLP multiple examples of which he needed to educate other staff members regarding recommendations. Patient left upright sitting EOB with alarm on and all needs within reach. Continue with current plan of care.      Pain No/Denies Pain   Therapy/Group: Individual Therapy  Shatasia Cutshaw 05/28/2021, 10:23 AM

## 2021-05-29 LAB — GLUCOSE, CAPILLARY
Glucose-Capillary: 100 mg/dL — ABNORMAL HIGH (ref 70–99)
Glucose-Capillary: 112 mg/dL — ABNORMAL HIGH (ref 70–99)
Glucose-Capillary: 124 mg/dL — ABNORMAL HIGH (ref 70–99)
Glucose-Capillary: 109 mg/dL — ABNORMAL HIGH (ref 70–99)

## 2021-05-29 MED ORDER — DOXAZOSIN MESYLATE 2 MG PO TABS: 2.0000 mg | ORAL_TABLET | Freq: Every day | ORAL | 0 refills | Status: DC

## 2021-05-29 MED ORDER — DICLOFENAC SODIUM 1 % EX GEL: 2.0000 g | Freq: Three times a day (TID) | CUTANEOUS | 0 refills | Status: AC | PRN

## 2021-05-29 MED ORDER — OMEPRAZOLE 20 MG PO CPDR: 20.0000 mg | DELAYED_RELEASE_CAPSULE | Freq: Every day | ORAL | 0 refills | Status: AC

## 2021-05-29 MED ORDER — AMIODARONE HCL 200 MG PO TABS: 200.0000 mg | ORAL_TABLET | Freq: Two times a day (BID) | ORAL | 0 refills | Status: DC

## 2021-05-29 MED ORDER — TRAZODONE HCL 100 MG PO TABS: 100.0000 mg | ORAL_TABLET | Freq: Every evening | ORAL | 0 refills | Status: AC | PRN

## 2021-05-29 NOTE — Progress Notes (Signed)
Physical Therapy Discharge Summary  Patient Details  Name: Hayden Mcbride MRN: [id number] Date of Birth: 08-01-1961  Today's Date: 05/29/2021 PT Individual Time: 1030-1200 PT Individual Time Calculation (min): 90 min    Patient has met 10 of 10 long term goals due to improved activity tolerance, improved balance, and increased strength.  Patient to discharge at an ambulatory level Supervision.   Patient's care partner is independent to provide the necessary physical assistance at discharge.  Reasons goals not met: NA  Recommendation:  Patient will benefit from ongoing skilled PT services in home health setting to continue to advance safe functional mobility, address ongoing impairments in strength, balance, ambulation, endurance, and minimize fall risk.  Equipment: RW  Reasons for discharge: treatment goals met and discharge from hospital  Patient/family agrees with progress made and goals achieved: Yes  Skilled Therapeutic Intervention:  Pt received supine in bed and agrees to therapy. No complaint of pain. Supine to sit independent. Sit to stand and stand step transfer to [facility] with verbal cues on positioning. WC transport to gym for time management. Pt performs car tranfser with verbal cues on sequencing. Pt ambulates x300' without AD and with PT cues for upright gaze to improve posture and balance, increasing trunk rotation and arm swing, increased gait speed and step height to decrease risk for falls. Sit to supine independently on mat.  Pt performs supine therex for bilateral lower extremity and core strengthening. Pt completes 3x10 bridges with 10 count hold on final rep. 3x5 single leg bridges with alternating legs. 1x10 hip flexion with contralateral arm extension and isometric hold for core engagement and coordination. 3x10 clamshells with blue theraband. Supine to sit independently.  Pt ambulates to stairs and completes x12 6" steps with bilateral hand rails and verbal cues on  sequencing and safety. Pt then completes TUG as detailed below.  WC transport to dayroom. Pt participates in "D[employer]" for work on bilateral upper extremity endurance and coordination, as well as strength and balance. Pt initially performs sitting down and then alternates standing and sitting every song. PT cues pt to stand with narrower BOS to challenge balance. Pt works on reaching outside [place] and performing dynamic upper body rotations and reaching. WC transport back to room. Stand step transfer to bed with supervision. Left seated with all needs within reach.  PT Discharge Precautions/Restrictions Precautions Precautions: Fall Restrictions Weight Bearing Restrictions: No Pain Pain Assessment Pain Scale: 0-10 Pain Score: 0-No pain Vision/Perception  Perception Perception: Within Functional Limits Praxis Praxis: Intact  Cognition Overall Cognitive Status: Within Functional Limits for tasks assessed Arousal/Alertness: Awake/alert Orientation Level: Oriented X4 Safety/Judgment: Appears intact Sensation Sensation Light Touch: Appears Intact Coordination Gross Motor Movements are Fluid and Coordinated: Yes Fine Motor Movements are Fluid and Coordinated: Yes Motor  Motor Motor: Within Functional Limits  Mobility Bed Mobility Bed Mobility: Supine to Sit;Sit to Supine Supine to Sit: Independent Sit to Supine: Independent Transfers Transfers: Sit to Stand;Stand to Sit;Stand Pivot Transfers Sit to Stand: Supervision/Verbal cueing Stand to Sit: Supervision/Verbal cueing Stand Pivot Transfers: Supervision/Verbal cueing Stand Pivot Transfer Details: Verbal cues for precautions/safety;Verbal cues for sequencing Locomotion  Gait Ambulation: Yes Gait Assistance: Supervision/Verbal cueing Gait Distance (Feet): 300 Feet Assistive device: None Gait Assistance Details: Verbal cues for sequencing;Verbal cues for precautions/safety;Verbal cues for gait pattern;Verbal cues for  technique Gait Gait: Yes Gait Pattern: Impaired Gait Pattern: Decreased stride length Gait velocity: Decreased Stairs / Additional Locomotion Stairs: Yes Stairs Assistance: Supervision/Verbal cueing Stair Management Technique: Two rails  Number of Stairs: 12 Height of Stairs: 6 Ramp: Supervision/Verbal cueing Curb: Supervision/Verbal cueing Wheelchair Mobility Wheelchair Mobility: No  Trunk/Postural Assessment  Cervical Assessment Cervical Assessment:  (forward head posture) Thoracic Assessment Thoracic Assessment:  (rounded shoulders) Lumbar Assessment Lumbar Assessment:  (posterior pelvic tilt4) Postural Control Postural Control: Within Functional Limits  Balance Balance Balance Assessed: Yes Standardized Balance Assessment Standardized Balance Assessment: Timed Up and Go Test Timed Up and Go Test TUG: Normal TUG Normal TUG (seconds): 16.6 Static Sitting Balance Static Sitting - Balance Support: Feet supported Static Sitting - Level of Assistance: 7: Independent Dynamic Sitting Balance Dynamic Sitting - Balance Support: Feet supported Dynamic Sitting - Level of Assistance: 7: Independent Static Standing Balance Static Standing - Balance Support: During functional activity Static Standing - Level of Assistance: 5: Stand by assistance Dynamic Standing Balance Dynamic Standing - Balance Support: During functional activity Dynamic Standing - Level of Assistance: 5: Stand by assistance Extremity Assessment   RLE Assessment RLE Assessment: Within Functional Limits LLE Assessment LLE Assessment: Within Functional Limits General Strength Comments: L hip 4/5, otherwise [facility]    Breck Coons, PT, DPT 05/29/2021, 4:07 PM

## 2021-05-29 NOTE — Progress Notes (Signed)
Occupational Therapy Discharge Summary  Patient Details  Name: Hayden Mcbride MRN: [id number] Date of Birth: 1961/01/05  Today's Date: 05/29/2021 OT Individual Time: 1400-1500 OT Individual Time Calculation (min): 60 min    Patient has met 11 of 11 long term goals due to improved activity tolerance, improved balance, postural control, functional use of  RIGHT upper and LEFT upper extremity, improved awareness, and improved coordination.  Patient to discharge at overall Supervision level/Mod Ind.  Patient's care partner is independent to provide the necessary physical assistance at discharge.    Reasons goals not met: N/A  Recommendation:  Patient will benefit from ongoing skilled OT services in home health setting to continue to advance functional skills in the area of BADL, iADL, and Reduce care partner burden.  Equipment: Rolling walker   Reasons for discharge: treatment goals met  Patient/family agrees with progress made and goals achieved: Yes  OT Discharge Precautions/Restrictions  Precautions Precautions: Fall Restrictions Weight Bearing Restrictions: No General   Vital Signs Therapy Vitals Temp: 98.4 F (36.9 C) Temp Source: Oral Pulse Rate: 73 Resp: 18 BP: 129/81 Patient Position (if appropriate): Lying Oxygen Therapy SpO2: 98 % O2 Device: Room Air Pain Pain Assessment Pain Scale: 0-10 Pain Score: 0-No pain ADL ADL Eating: Modified independent Grooming: Setup Where Assessed-Grooming: Standing at sink Upper Body Bathing: Supervision/safety Where Assessed-Upper Body Bathing: Shower Lower Body Bathing: Supervision/safety Where Assessed-Lower Body Bathing: Shower Upper Body Dressing: Modified independent (Device) Where Assessed-Upper Body Dressing: Edge of bed Lower Body Dressing: Supervision/safety Where Assessed-Lower Body Dressing: Edge of bed Toileting: Supervision/safety Where Assessed-Toileting: Glass blower/designer: Distant  supervision Armed forces technical officer Method: Human resources officer: Close supervison Clinical cytogeneticist Method: Magazine features editor: Close supervision Social research officer, government Method: Heritage manager: Civil engineer, contracting with back Vision Baseline Vision/History: Wears glasses Wears Glasses: At all times Patient Visual Report: No change from baseline Perception  Perception: Within Functional Limits Praxis Praxis: Intact Cognition Overall Cognitive Status: Within Functional Limits for tasks assessed Arousal/Alertness: Awake/alert Orientation Level: Oriented X4 Attention: Sustained Sustained Attention: Appears intact Memory: Appears intact Sensation Sensation Light Touch: Appears Intact Coordination Gross Motor Movements are Fluid and Coordinated: Yes Fine Motor Movements are Fluid and Coordinated: Yes Motor  Motor Motor: Within Functional Limits Mobility  Bed Mobility Bed Mobility: Supine to Sit;Sit to Supine Supine to Sit: Independent Sit to Supine: Independent with assistive device Transfers Sit to Stand: Supervision/Verbal cueing Stand to Sit: Supervision/Verbal cueing  Trunk/Postural Assessment  Postural Control Postural Control: Within Functional Limits  Balance Static Sitting Balance Static Sitting - Balance Support: No upper extremity supported;Feet supported Static Sitting - Level of Assistance: 7: Independent Dynamic Sitting Balance Dynamic Sitting - Balance Support: No upper extremity supported;Feet supported Dynamic Sitting - Level of Assistance: 7: Independent Sitting balance - Comments: able to don/doff seated EOB without LOB Static Standing Balance Static Standing - Balance Support: No upper extremity supported;During functional activity Static Standing - Level of Assistance: 5: Stand by assistance Dynamic Standing Balance Dynamic Standing - Balance Support: During functional activity;No upper extremity supported Dynamic  Standing - Level of Assistance: 5: Stand by assistance Extremity/Trunk Assessment RUE Assessment RUE Assessment: Within Functional Limits General Strength Comments: Limited shoulder ROM 90 degrees (AROM) but uses UE functionally LUE Assessment LUE Assessment: Within Functional Limits General Strength Comments: Limited shoulder ROM 90 degrees (AROM), but able to use LUE functionally   Hebah Bogosian 05/29/2021, 3:20 PM

## 2021-05-29 NOTE — Progress Notes (Signed)
PROGRESS NOTE   Subjective/Complaints:   In good spirits. Feels that protonix helped reflux, cough. Asked about dc process tomorrow  ROS: Patient denies fever, rash, sore throat, blurred vision, nausea, vomiting, diarrhea,  shortness of breath or chest pain, joint or back pain, headache, or mood change.   Objective:   No results found.  No results for input(s): WBC, HGB, HCT, PLT in the last 72 hours.  No results for input(s): NA, K, CL, CO2, GLUCOSE, BUN, CREATININE, CALCIUM in the last 72 hours.   Intake/Output Summary (Last 24 hours) at 05/29/2021 1012 Last data filed at 05/29/2021 0720 Gross per 24 hour  Intake 791 ml  Output --  Net 791 ml        Physical Exam: Vital Signs Blood pressure 118/69, pulse 67, temperature (!) 97.5 F (36.4 C), temperature source Oral, resp. rate 19, height 5\' 10"  (1.778 m), weight 100.8 kg, SpO2 98 %.     Constitutional: No distress . Vital signs reviewed. HEENT: EOMI, oral membranes moist Neck: supple Cardiovascular: RRR without murmur. No JVD    Respiratory/Chest: CTA Bilaterally without wheezes or rales. Normal effort    GI/Abdomen: BS +, non-tender, non-distended Ext: no clubbing, cyanosis, or edema Psych: pleasant and cooperative, flat Neurological: alert Skin: dry feet, chronic vascular changes--iimproved Neuro: Pt is cognitively appropriate with reasonable insight, memory, and awareness. Improving STM deficits Cranial nerves 2-12 are intact. Stocking glove sensory loss in feet remains. Reflexes are 2+ in all 4's. Fine motor coordination is intact. No tremors. Motor function is grossly 4/5 and   4/5 in lower extremities. Good siting balance. +Tinel's left ulnar groove--ulnar sensory changes-still present.  No ulnar muscle weakness Musculoskeletal: Full ROM, No pain with AROM or PROM in the neck, trunk, or extremities. Posture appropriate      Assessment/Plan: 1.  Functional deficits which require 3+ hours per day of interdisciplinary therapy in a comprehensive inpatient rehab setting. Physiatrist is providing close team supervision and 24 hour management of active medical problems listed below. Physiatrist and rehab team continue to assess barriers to discharge/monitor patient progress toward functional and medical goals  Care Tool:  Bathing    Body parts bathed by patient: Face   Body parts bathed by helper: Right lower leg, Left lower leg     Bathing assist Assist Level: Set up assist     Upper Body Dressing/Undressing Upper body dressing   What is the patient wearing?: Pull over shirt    Upper body assist Assist Level: Supervision/Verbal cueing    Lower Body Dressing/Undressing Lower body dressing      What is the patient wearing?: Incontinence brief, Pants     Lower body assist Assist for lower body dressing: Contact Guard/Touching assist     Toileting Toileting    Toileting assist Assist for toileting: Set up assist     Transfers Chair/bed transfer  Transfers assist     Chair/bed transfer assist level: Supervision/Verbal cueing Chair/bed transfer assistive device: Programmer, multimedia   Ambulation assist      Assist level: Supervision/Verbal cueing Assistive device: Walker-rolling Max distance: >350 ft   Walk 10 feet activity   Assist  Assist level: Supervision/Verbal cueing Assistive device: Walker-rolling   Walk 50 feet activity   Assist    Assist level: Supervision/Verbal cueing Assistive device: Walker-rolling    Walk 150 feet activity   Assist Walk 150 feet activity did not occur: Safety/medical concerns  Assist level: Supervision/Verbal cueing Assistive device: Walker-rolling    Walk 10 feet on uneven surface  activity   Assist     Assist level: Supervision/Verbal cueing Assistive device: Walker-rolling   Wheelchair     Assist Will patient use  wheelchair at discharge?: No Type of Wheelchair: Manual    Wheelchair assist level: Supervision/Verbal cueing Max wheelchair distance: 50    Wheelchair 50 feet with 2 turns activity    Assist        Assist Level: Supervision/Verbal cueing   Wheelchair 150 feet activity     Assist          Blood pressure 118/69, pulse 67, temperature (!) 97.5 F (36.4 C), temperature source Oral, resp. rate 19, height 5\' 10"  (1.778 m), weight 100.8 kg, SpO2 98 %.  Medical Problem List and Plan: 1.  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  Patient has completed high-dose penicillin followed by infectious disease             -patient may shower             -ELOS 6/17  Con't PT, OT, and SLP -finalize dc planning 2.  Antithrombotics: -DVT/anticoagulation: Subcutaneous heparin             -antiplatelet therapy: N/A 3. Pain Management: Voltaren gel 3 times daily as needed, Tylenol as needed  6/11- asked him to either ask for pain meds/tylenol more, or can upgrade pain meds?  6/12- did better with tylenol x2 yesterday- con't regimen 4. Mood/insomnia: Provide emotional support  -continue trazodone 50mg  qhs at 9pm helpful             -antipsychotic agents: N/A 5. Neuropsych: This patient is capable of making decisions on his own behalf. 6. Skin/Wound Care: Routine skin checks 7. Fluids/Electrolytes/Nutrition:   -low albumin   -eating well,now, added protein supplement 8.  Dysphagia.  Dysphagia #3 nectar thick liquids.  Tolerating diet             -added protonix for associated reflux--was taking omeprazole at home 9.  Atrial fibrillation/flutter.  Amiodarone 200 mg twice daily.  Cardiac rate controlled at present 10.  MRSA pneumonia/ID.  Patient completed 7-day course of linezolid as of 05/17/2021  -IS, OOB  -6/16will check with ID on need for f/u prior to d/c or after d/c 11.  Hypertension.  Cardura 2 mg daily.  Monitor with  increased mobility  6/11- BP is well controlled- con't regimen 12. Oral Thrush: diflucan 100mg  po x 5 days completed-resolved 13. HOH: hears best through left ear             - hearing aids brought from home             -able to hear fairly well if provider speaks loudly and clearly 14. Leukocytosis: improved to 7.8  -no s/s infection--recheck 6/10 15. Left hand/arm numbness  -likely ulnar nerve compression at elbow during ICU stay  -encouraged extension of elbow  -wearing elbow pad  -consider outpt NCS if symptoms persist 16. Insomnia 6/12- increased trazodone to 100 mg QHS- at 9pm- seems better  LOS: 10 days A FACE TO [place] 05/29/2021, 10:12 AM

## 2021-05-29 NOTE — Progress Notes (Signed)
Patient ID: Hayden Mcbride, male   DOB: 1961/11/02, 60 y.o.   MRN: [id number]  Pt accepted for HHPT/OT/SLP by [facility].   RW ordered on 6/13.  *SW made efforts to update pt but pt not in room.    SW called pt wife Sharee Pimple to provide above updates. SW informed per [facility] that pt will have $33 per visit co-pay. Wife intends to be here tomorrow at [facility] for pt d/c. SW informed will follow-up once there is an update on which branch will provide services.   Loralee Pacas, MSW, [place] Office: [phone] Cell: [phone] Fax: [phone]

## 2021-05-29 NOTE — Progress Notes (Signed)
Speech Language Pathology Discharge Summary  Patient Details  Name: Hayden Mcbride MRN: [id number] Date of Birth: 03/21/1961  Today's Date: 05/29/2021 SLP Individual Time: 0725-0810 SLP Individual Time Calculation (min): 45 min   Skilled Therapeutic Interventions:  Skilled treatment session focused on swallowing goals and completion of patient education. Patient consumed nectar-thick liquids via cup throughout the session with intermittent, subtle throat clearing with Mod I for use of swallowing compensatory strategies. Recommend patient continue current diet. SLP also facilitated session by providing education and handouts regarding diet recommendations, appropriate textures, swallowing compensatory strategies, how to order thickener and other alternatives to thickening packets that he could purchase in a store. Patient verbalized understanding of all information and all questions were answered. Patient left sitting EOB with alarm on and all needs within reach.   Patient has met 7 of 7 long term goals.  Patient to discharge at overall Modified Independent;Supervision level.   Reasons goals not met: N/A   Clinical Impression/Discharge Summary: Patient has made excellent gains and has met 7 of 7 LTGs this admission. Currently, patient is overall Mod I to complete functional and mildly complex tasks safely in regards to complex problem solving, recall of functional information, and selective attention. Patient is 100% intelligible at the conversation level with adequate breath support and vocal intensity, however, patient reports his voice still feels and sounds "weak." Patient's most recent MBS was on 05/26/21 which showed a bony protrusion impeding into his pharynx resulting in poor epiglottic inversion and penetration/aspiration with nectar-thick and thin liquids (please see report for further details). Patient's current swallowing deficits are likely chronic but appear more evident now due to  decondoitioning. Patient's wife also endorses constant throat clearing at baseline. Patient is consuming Dys. 3 textures with nectar-thick liquids with minimal overt s/s of aspiration and Mod I for use of swallowing compensatory strategies. Patient is also utilizing the water protocol in between meals appropriately. Patient would benefit from f/u SLP services to maximize his swallowing and speech function. SLP would prefer outpatient SLP services to patient's deficits being more high-level, however, patient and wife prefer home health due to issues with transportation.  Care Partner:  Caregiver Able to Provide Assistance: Yes     Recommendation:  Home Health SLP  Rationale for SLP Follow Up: Reduce caregiver burden;Maximize swallowing safety   Equipment: Thickener packets   Reasons for discharge: Discharged from hospital;Treatment goals met   Patient/Family Agrees with Progress Made and Goals Achieved: Yes    [place], [place] 05/29/2021, 6:41 AM

## 2021-05-29 NOTE — Progress Notes (Signed)
Occupational Therapy Session Note  Patient Details  Name: Hayden Mcbride MRN: [id number] Date of Birth: 1961-04-23  Today's Date: 05/29/2021 OT Individual Time: 1400-1500 OT Individual Time Calculation (min): 60 min    Short Term Goals: Week 1:  OT Short Term Goal 1 (Week 1): LTG=STG 2/2 ELOS  Skilled Therapeutic Interventions/Progress Updates:   Met pt lying reclined in bed, bed alarm on, pt agreed to session. Treatment session was focused on self-care retraining, bathroom transfers and improving UE ROM. Pt was Ind for bed mobility. Pt ambulated with RW short distances throughout room. Pt used BUE to cross midline and reach clothing. Pt was doffed all clothing independently. Pt was supervision for UB and LB bathing tasks. Pt donned UB clothing independently and LB clothing with supervision sitting and standing in bathroom. Pt completed grooming standing at sink independently. Pt ambulated to rehab apartment with RW to simulate walk-in shower transfer with distant supervision. Pt expressed he has both a tub/shower and walk-in shower but will strictly use walk-in shower for bathing. Pt completed 3x10 of UE exercises using hula hoop (shoulder flexion, chest presses, bicep curls). Pt was brought back to room and requested to use bathroom. Pt completed toileting and toilet transfer with supervision. Pt was ind for returning to bed using RW. Pt expressed no further concerns for d/c. Left pt lying in bed, bed alarm on, all needs met.    Therapy Documentation Precautions:  Precautions Precautions: Fall Precaution Comments: generalized weakness, delayed processing Restrictions Weight Bearing Restrictions: No   Vital Signs: Therapy Vitals Temp: 98.4 F (36.9 C) Temp Source: Oral Pulse Rate: 73 Resp: 18 BP: 129/81 Patient Position (if appropriate): Lying Oxygen Therapy SpO2: 98 % O2 Device: Room Air Pain: Pain Assessment Pain Scale: 0-10 Pain Score: 0-No pain ADL: ADL Eating: Modified  independent Grooming: Independent Where Assessed-Grooming: Standing at sink Upper Body Bathing: Supervision/safety Where Assessed-Upper Body Bathing: Shower Lower Body Bathing: Supervision/safety Where Assessed-Lower Body Bathing: Shower Upper Body Dressing: Independent Where Assessed-Upper Body Dressing: Edge of bed Lower Body Dressing: Supervision/safety Where Assessed-Lower Body Dressing: Edge of bed Toileting: Supervision/safety Where Assessed-Toileting: Glass blower/designer: Distant supervision Armed forces technical officer Method: Human resources officer: Close supervison Clinical cytogeneticist Method: Magazine features editor: Close supervision Social research officer, government Method: Heritage manager: Shower seat with back    Therapy/Group: Individual Therapy  Bayard Males 05/29/2021, 3:28 PM

## 2021-05-30 DIAGNOSIS — G5622 Lesion of ulnar nerve, left upper limb: Secondary | ICD-10-CM

## 2021-05-30 LAB — GLUCOSE, CAPILLARY: Glucose-Capillary: 117 mg/dL — ABNORMAL HIGH (ref 70–99)

## 2021-05-30 NOTE — Progress Notes (Signed)
PROGRESS NOTE   Subjective/Complaints:  No problems. Feels well. Wife on way in to pick him up. Asked about follow up  ROS: Patient denies fever, rash, sore throat, blurred vision, nausea, vomiting, diarrhea, cough, shortness of breath or chest pain, joint or back pain, headache, or mood change.    Objective:   No results found.  No results for input(s): WBC, HGB, HCT, PLT in the last 72 hours.  No results for input(s): NA, K, CL, CO2, GLUCOSE, BUN, CREATININE, CALCIUM in the last 72 hours.   Intake/Output Summary (Last 24 hours) at 05/30/2021 0912 Last data filed at 05/30/2021 0737 Gross per 24 hour  Intake 776 ml  Output --  Net 776 ml        Physical Exam: Vital Signs Blood pressure 125/76, pulse 70, temperature 98.5 F (36.9 C), temperature source Oral, resp. rate 19, height 5\' 10"  (1.778 m), weight 102 kg, SpO2 97 %.     Constitutional: No distress . Vital signs reviewed. HEENT: EOMI, oral membranes moist Neck: supple Cardiovascular: RRR without murmur. No JVD    Respiratory/Chest: CTA Bilaterally without wheezes or rales. Normal effort    GI/Abdomen: BS +, non-tender, non-distended Ext: no clubbing, cyanosis, or edema Psych: pleasant and cooperative    Skin: dry feet, chronic vascular changes--iimproved Neuro: Pt is cognitively appropriate with reasonable insight, memory, and awareness. Improving STM deficits Cranial nerves 2-12 are intact. Stocking glove sensory loss in feet remains. Reflexes are 2+ in all 4's. Fine motor coordination is intact. No tremors. Motor function is grossly 4/5 and   4/5 in lower extremities. Good siting balance. +Tinel's left ulnar groove--ulnar sensory changes-still present.  No ulnar muscle weakness Musculoskeletal: Full ROM, No pain with AROM or PROM in the neck, trunk, or extremities. Posture appropriate      Assessment/Plan: 1. Functional deficits which require 3+ hours  per day of interdisciplinary therapy in a comprehensive inpatient rehab setting. Physiatrist is providing close team supervision and 24 hour management of active medical problems listed below. Physiatrist and rehab team continue to assess barriers to discharge/monitor patient progress toward functional and medical goals  Care Tool:  Bathing    Body parts bathed by patient: Right arm, Left arm, Chest, Abdomen, Front perineal area, Buttocks, Right upper leg, Left upper leg, Face, Left lower leg, Right lower leg   Body parts bathed by helper: Right lower leg, Left lower leg     Bathing assist Assist Level: Supervision/Verbal cueing     Upper Body Dressing/Undressing Upper body dressing   What is the patient wearing?: Pull over shirt    Upper body assist Assist Level: Independent    Lower Body Dressing/Undressing Lower body dressing      What is the patient wearing?: Underwear/pull up, Pants     Lower body assist Assist for lower body dressing: Supervision/Verbal cueing     Toileting Toileting    Toileting assist Assist for toileting: Set up assist     Transfers Chair/bed transfer  Transfers assist     Chair/bed transfer assist level: Supervision/Verbal cueing Chair/bed transfer assistive device: Programmer, multimedia   Ambulation assist      Assist level:  Supervision/Verbal cueing Assistive device: No Device Max distance: 300'   Walk 10 feet activity   Assist     Assist level: Supervision/Verbal cueing Assistive device: No Device   Walk 50 feet activity   Assist    Assist level: Supervision/Verbal cueing Assistive device: No Device    Walk 150 feet activity   Assist Walk 150 feet activity did not occur: Safety/medical concerns  Assist level: Supervision/Verbal cueing Assistive device: No Device    Walk 10 feet on uneven surface  activity   Assist     Assist level: Supervision/Verbal cueing Assistive device:  Walker-rolling   Wheelchair     Assist Will patient use wheelchair at discharge?: No Type of Wheelchair: Manual    Wheelchair assist level: Supervision/Verbal cueing Max wheelchair distance: 50    Wheelchair 50 feet with 2 turns activity    Assist        Assist Level: Supervision/Verbal cueing   Wheelchair 150 feet activity     Assist          Blood pressure 125/76, pulse 70, temperature 98.5 F (36.9 C), temperature source Oral, resp. rate 19, height 5\' 10"  (1.778 m), weight 102 kg, SpO2 97 %.  Medical Problem List and Plan: 1.  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  Patient has completed high-dose penicillin followed by infectious disease             -patient may shower             -dc home today  -f/u with [facility] in 3-4 weeks as well as primary. ID PRN 2.  Antithrombotics: -DVT/anticoagulation: Subcutaneous heparin             -antiplatelet therapy: N/A 3. Pain Management: Voltaren gel 3 times daily as needed, Tylenol as needed  controlled 4. Mood/insomnia: Provide emotional support  -continue trazodone 100mg  qhs               -antipsychotic agents: N/A 5. Neuropsych: This patient is capable of making decisions on his own behalf. 6. Skin/Wound Care: Routine skin checks 7. Fluids/Electrolytes/Nutrition:   -low albumin   -eating well,now, added protein supplement 8.  Dysphagia.  Dysphagia #3 nectar thick liquids.  Tolerating diet             -may resume omeprazole at home 9.  Atrial fibrillation/flutter.  Amiodarone 200 mg twice daily.  Cardiac rate controlled at present 10.  MRSA pneumonia/ID.  Patient completed 7-day course of linezolid as of 05/17/2021  -IS, OOB  -ID f/u prn 11.  Hypertension.  Cardura 2 mg daily.  Monitor with increased mobility  6/16- BP is well controlled- con't regimen 12. Oral Thrush: diflucan 100mg  po x 5 days completed-resolved 13. HOH: hears best through left  ear             - hearing aids brought from home             -able to hear fairly well if provider speaks loudly and clearly 14. Leukocytosis: improved to 7.8  -no s/s infection--recheck 6/10 15. Left hand/arm numbness  -likely ulnar nerve compression at elbow during ICU stay  -  extension of elbow  -wearing elbow pad  -consider outpt NCS if symptoms persist    LOS: 11 days A FACE TO FACE EVALUATION WAS PERFORMED  Meredith Staggers 05/30/2021, 9:12 AM

## 2021-05-30 NOTE — Progress Notes (Signed)
Inpatient Rehabilitation Care Coordinator Discharge Note  The overall goal for the admission was met for:   Discharge location: Yes. D/c to home with support from pt wife.  Length of Stay: Yes. 10 days.   Discharge activity level: Yes. Supervision.   Home/community participation: Yes. Limited.   Services provided included: MD, RD, PT, OT, SLP, RN, CM, TR, Pharmacy, Neuropsych, and SW  Financial Services: Private Insurance: [place] offered to/list presented to:Yes  Follow-up services arranged: Home Health: [facility] for HHPT/OT/SLP, DME: [facility] for RW, and Patient/Family has no preference for HH/DME agencies  Comments (or additional information):  Patient/Family verbalized understanding of follow-up arrangements: Yes  Individual responsible for coordination of the follow-up plan: contact pt wife Sharee Pimple [phone]  Confirmed correct DME delivered: Rana Snare 05/30/2021    Rana Snare

## 2021-05-30 NOTE — Progress Notes (Signed)
Patient safely discharged from unit at 0932. Patient is alert and oriented x 4. Patient transported by wheelchair.  Idamae Schuller, LPN

## 2021-05-30 NOTE — Progress Notes (Signed)
Patient ID: Hayden Mcbride, male   DOB: 1961/08/05, 60 y.o.   MRN: [id number]  SW met with pt in room to provide updates on HHA and confirmed RW in room. During visit, pt wife and dtr Hayden Mcbride arrived. SW reiterated unsure on which branch will service: [facility] or [place] but will leave contact information for [employer]. No questions/concerns reported to SW.   Loralee Pacas, MSW, [place] Office: [phone] Cell: [phone] Fax: [phone]

## 2021-07-01 ENCOUNTER — Encounter: Payer: Self-pay | Admitting: Neurology

## 2021-07-01 ENCOUNTER — Ambulatory Visit (INDEPENDENT_AMBULATORY_CARE_PROVIDER_SITE_OTHER): Payer: Commercial Managed Care - PPO | Admitting: Neurology

## 2021-07-01 VITALS — BP 125/80 | HR 51 | Ht 71.0 in | Wt 229.0 lb

## 2021-07-01 DIAGNOSIS — G039 Meningitis, unspecified: Secondary | ICD-10-CM

## 2021-07-01 DIAGNOSIS — R202 Paresthesia of skin: Secondary | ICD-10-CM

## 2021-07-01 HISTORY — DX: Paresthesia of skin: R20.2

## 2021-07-01 NOTE — Progress Notes (Signed)
Reason for visit: Pneumococcal meningitis  Referring physician: [facility]  Hayden Mcbride is a 60 y.o. male  History of present illness:  Hayden Mcbride is a 60 year old right-handed white male who was admitted to the hospital on 29 Apr 2021 with altered mental status.  Several hours before hospitalization the patient began having severe headaches and neck discomfort, he went to the hospital in an agitated state, he has no recollection of this.  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  Lumbar puncture was done showing greater than 500 white blood cells, and a protein level of 600, glucose of 20.  The patient was started on broad-spectrum antibiotics and acyclovir.  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  He was in the hospital for least 2 weeks, he has very little recollection of this.  Since the hospitalization, the patient has made a good recovery.  He still has some occasional headaches on average twice a week.  He did have a bifrontal headaches off and on before the hospitalization for several years, he attributed these to sinus problems.  The current headaches are bitemporal in nature.  The patient is taking over-the-counter medications for this.  He reports some numbness and tingling sensation in the occipital area of the head bilaterally.  He has developed some numbness in the left hand and forearm and ulnar nerve distribution.  He has also noted some alteration in his sensation of taste.  He believes that his hearing in the left ear has declined.  He has a hearing aid on the right ear.  He will be seeing Dr. Benjamine Mola in the near future.  The patient reports no numbness or weakness per se in the arms and legs with exception of what has been mentioned above, he denies any balance changes or difficulty controlling the bowels or the bladder.  He believes that his memory and cognitive capacity has returned back to normal.  He denies issues controlling the bowels or the  bladder.  He comes to this office for further evaluation.  Past Medical History:  Diagnosis Date   Allergy    Dry eyes    GERD (gastroesophageal reflux disease)    Glaucoma    Hx of adenomatous colonic polyps 07/08/2006   06/2006 - diminutive adenoma 04/19/2015 - diminutive adenoma and one polyp lost - repeat colonoscopy 2021   Hypertension    Post-operative nausea and vomiting    Sleep apnea    wears CPAP   Thyroid disease    long ago- half thyroid removed ~20 yrs ago     Past Surgical History:  Procedure Laterality Date   BREATH TEK H PYLORI N/A 12/24/2014   Procedure: BREATH TEK H PYLORI;  Surgeon: Alphonsa Overall, MD;  Location: [facility];  Service: General;  Laterality: N/A;   CHOLECYSTECTOMY     COLONOSCOPY     EYE SURGERY     age 18    INNER EAR SURGERY     tumor inside right ear   POLYPECTOMY     SHOULDER SURGERY     left shoulder   THYROID SURGERY      Family History  Problem Relation Age of Onset   Diverticulitis Mother    Colon cancer Father        in his 52's dx'd    Cancer Maternal Grandmother    Esophageal cancer Neg Hx    Stomach cancer Neg Hx    Rectal cancer Neg Hx    Colon polyps  Neg Hx     Social history:  reports that he quit smoking about 36 years ago. His smoking use included cigarettes. He has never used smokeless tobacco. He reports current alcohol use of about 2.0 standard drinks of alcohol per week. He reports that he does not use drugs.  Medications:  Prior to Admission medications   Medication Sig Start Date End Date Taking? Authorizing Provider  acetaminophen (TYLENOL) 325 MG tablet Take 2 tablets (650 mg total) by mouth every 6 (six) hours as needed for mild pain or headache. 05/19/21  Yes Donne Hazel, MD  amLODipine (NORVASC) 5 MG tablet Take 1 tablet by mouth daily. 06/09/21 09/07/21 Yes [provider]  diclofenac Sodium (VOLTAREN) 1 % GEL Apply 2 g topically 3 (three) times daily as needed (pain). 05/29/21  Yes Angiulli, Lavon Paganini, PA-C  losartan (COZAAR) 100 MG tablet Take 100 mg by mouth daily. 06/24/21  Yes [provider]  Multiple Vitamins-Minerals (ONE-A-DAY MENS 50+) TABS Take 1 tablet by mouth daily.   Yes [provider]  omeprazole (PRILOSEC) 20 MG capsule Take 1 capsule (20 mg total) by mouth daily. 05/29/21  Yes Angiulli, Lavon Paganini, PA-C  traZODone (DESYREL) 100 MG tablet Take 1 tablet (100 mg total) by mouth at bedtime as needed for sleep. 05/29/21  Yes Angiulli, Lavon Paganini, PA-C      Allergies  Allergen Reactions   Demerol [Meperidine]     Large doses cause nausea and vomiting    ROS:  Out of a complete 14 system review of symptoms, the patient complains only of the following symptoms, and all other reviewed systems are negative.  Numbness Headache Decree sense of taste  Blood pressure 125/80, pulse (!) 51, height 5\' 11"  (1.803 m), weight 229 lb (103.9 kg).  Physical Exam  General: The patient is alert and cooperative at the time of the examination.  Eyes: Pupils are equal, round, and reactive to light. Discs are flat bilaterally.  Neck: The neck is supple, no carotid bruits are noted.  Respiratory: The respiratory examination is clear.  Cardiovascular: The cardiovascular examination reveals a regular rate and rhythm, no obvious murmurs or rubs are noted.  Skin: Extremities are without significant edema.  Neurologic Exam  Mental status: The patient is alert and oriented x 3 at the time of the examination. The patient has apparent normal recent and remote memory, with an apparently normal attention span and concentration ability.  Cranial nerves: Facial symmetry is present. There is good sensation of the face to pinprick and soft touch bilaterally. The strength of the facial muscles and the muscles to head turning and shoulder shrug are normal bilaterally. Speech is well enunciated, no aphasia or dysarthria is noted. Extraocular movements are full. Visual fields are full.  The tongue is midline, and the patient has symmetric elevation of the soft palate. No obvious hearing deficits are noted.  Motor: The motor testing reveals 5 over 5 strength of all 4 extremities. Good symmetric motor tone is noted throughout.  Sensory: Sensory testing is intact to pinprick, soft touch, vibration sensation, and position sense on all 4 extremities. No evidence of extinction is noted.  Coordination: Cerebellar testing reveals good finger-nose-finger and heel-to-shin bilaterally.  Gait and station: Gait is normal. Tandem gait is normal. Romberg is negative. No drift is seen.  Reflexes: Deep tendon reflexes are symmetric, but are decreased bilaterally. Toes are downgoing bilaterally.   MRI brain 05/06/21:  IMPRESSION: Findings consistent with the clinical diagnosis of bacterial  meningitis. Since the previous study, the patient has developed more numerous foci of restricted diffusion scattered about the subarachnoid spaces, with areas of septation and synechiae formation consistent with ongoing bacterial meningitis. Small amount of restricted diffusion material present in the fourth ventricle, but no evidence of widespread ventriculitis. No sign of venous thrombosis. No sign of brain parenchymal infarction. No change in ventricular size. Remarkable absence of abnormal leptomeningeal contrast enhancement.  * MRI scan images were reviewed online. I agree with the written report.   EEG 04/30/21:  IMPRESSION: This study is suggestive of cortical dysfunction arising from right hemisphere region likely secondary to underlying structural abnormality within normal limits. Additionally, there is moderate diffuse encephalopathy, non specific etiology. No seizures or epileptiform discharges were seen throughout the recording.      Assessment/Plan:  1.  History of pneumococcal meningitis  2.  Occipital neuropathy, bilateral  3.  Left ulnar neuropathy  4.  Altered taste  sensation  The patient has some residual from the pneumococcal meningitis.  He has some numbness in the occipital nerve distribution bilaterally without significant discomfort.  He has developed a left ulnar nerve distribution numbness that likely was related to compression while in the ICU bed.  He has had some alteration in taste sensation.  The patient is on a multivitamin currently, he will continue this.  We will watch the left ulnar nerve issue conservatively, if this has not improved in the next 2 to 3 months he will call our office and we will consider EMG and nerve conduction study evaluation.  He is to avoid pressure on the elbow.  The patient will be seen for his alteration in hearing.  Overall, the patient done quite well, I have given him a note to release him back to work at this time.  Jill Alexanders MD 07/01/2021 9:49 AM  [facility] [address] [place], [place] [address]  Phone [phone] Fax [phone]

## 2021-07-07 ENCOUNTER — Ambulatory Visit: Payer: Self-pay | Admitting: Diagnostic Neuroimaging

## 2021-07-09 ENCOUNTER — Encounter
Payer: Commercial Managed Care - PPO | Attending: Physical Medicine & Rehabilitation | Admitting: Physical Medicine & Rehabilitation

## 2021-07-09 ENCOUNTER — Other Ambulatory Visit: Payer: Self-pay

## 2021-07-09 ENCOUNTER — Encounter: Payer: Self-pay | Admitting: Physical Medicine & Rehabilitation

## 2021-07-09 VITALS — BP 121/80 | HR 48 | Temp 98.5°F | Ht 71.0 in | Wt 228.0 lb

## 2021-07-09 DIAGNOSIS — G001 Pneumococcal meningitis: Secondary | ICD-10-CM | POA: Insufficient documentation

## 2021-07-09 DIAGNOSIS — G43009 Migraine without aura, not intractable, without status migrainosus: Secondary | ICD-10-CM | POA: Insufficient documentation

## 2021-07-09 DIAGNOSIS — G5622 Lesion of ulnar nerve, left upper limb: Secondary | ICD-10-CM | POA: Diagnosis present

## 2021-07-09 MED ORDER — SUMATRIPTAN SUCCINATE 50 MG PO TABS: 50.0000 mg | ORAL_TABLET | Freq: Once | ORAL | 2 refills | Status: AC | PRN

## 2021-07-09 NOTE — Patient Instructions (Addendum)
SLEEP HYGIENE  MELATONIN '5mg'$  at night for 3 nights. If no benefit increase to '10mg'$  at night for 3 days. If still no benefit add back trazodone.   If your left arm isn't improving, we can perform nerve testing to assess location and severity.

## 2021-07-09 NOTE — Progress Notes (Addendum)
Subjective:    Patient ID: Hayden Mcbride, male    DOB: [birth year], 60 y.o.   MRN: [id number]  HPI Mr. Boldt is here in follow up of his encephalopathy/meningitis. He has graduated from [facility] therapies. Is doing a HEP with more a focus on his upper limbs. He is doing some band-resistance work. He stays active at home and has even been cutting the grass. He would like to go back to work.  He is having light headaches a couple days per week and has a more severe headache every 1.5 weeks. He tends to wake up with the severe headache and it's in the frontal-temporal area. Tylenol doesn't seem to help. Resting can help but often the headache will last 24+ hours.   Sleep remains an issue as he wakes up after 2-3 hours. He denies any associated headache, need to urinate,etc. He is not taking trazodone consistently because he hasn't seen a benefit consistently. He uses CPAP which he says is working appropriately.   Taste remains off since hospitalization. He senses only more pungent/robust foods typically  His left hand/wrist remain numb since the hospital. He hasn't seen any real change. He denies any associated weakness.   Pain Inventory Average Pain 0 Pain Right Now 0 My pain is  no pain   In the last 24 hours, has pain interfered with the following? General activity 0 Relation with others 0 Enjoyment of life 0 What TIME of day is your pain at its worst? varies Sleep (in general) Fair  Pain is worse with:  no pain Pain improves with:  no pain Relief from Meds: 0  walk without assistance how many minutes can you walk? 1 hr ability to climb steps?  yes do you drive?  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  numbness tingling loss of taste or smell  N/a  N/a    Family History  Problem Relation Age of Onset   Diverticulitis Mother    Colon cancer Father        in his 5's dx'd    Cancer Maternal Grandmother    Esophageal cancer Neg  Hx    Stomach cancer Neg Hx    Rectal cancer Neg Hx    Colon polyps Neg Hx    Social History   Socioeconomic History   Marital status: Married    Spouse name: Not on file   Number of children: Not on file   Years of education: Not on file   Highest education level: Not on file  Occupational History   Not on file  Tobacco Use   Smoking status: Former    Years: 10.00    Types: Cigarettes    Quit date: 02/26/1985    Years since quitting: 36.3   Smokeless tobacco: Never  Vaping Use   Vaping Use: Never used  Substance and Sexual Activity   Alcohol use: Yes    Alcohol/week: 2.0 standard drinks    Types: 2 Cans of beer per week   Drug use: No   Sexual activity: Not Currently  Other Topics Concern   Not on file  Social History Narrative   Not on file   Social Determinants of Health   Financial Resource Strain: Not on file  Food Insecurity: Not on file  Transportation Needs: Not on file  Physical Activity: Not on file  Stress: Not on file  Social Connections: Not on file   Past Surgical History:  Procedure Laterality Date   BREATH TEK H PYLORI N/A 12/24/2014   Procedure: BREATH TEK H PYLORI;  Surgeon: Alphonsa Overall, MD;  Location: [facility];  Service: General;  Laterality: N/A;   CHOLECYSTECTOMY     COLONOSCOPY     EYE SURGERY     age 73    INNER EAR SURGERY     tumor inside right ear   POLYPECTOMY     SHOULDER SURGERY     left shoulder   THYROID SURGERY     Past Medical History:  Diagnosis Date   Allergy    Dry eyes    GERD (gastroesophageal reflux disease)    Glaucoma    Hx of adenomatous colonic polyps 07/08/2006   06/2006 - diminutive adenoma 04/19/2015 - diminutive adenoma and one polyp lost - repeat colonoscopy 2021   Hypertension    Paresthesia 07/01/2021   Post-operative nausea and vomiting    Sleep apnea    wears CPAP   Thyroid disease    long ago- half thyroid removed ~20 yrs ago    BP 121/80   Pulse (!) 48   Temp 98.5 F (36.9 C) (Oral)   Ht  '5\' 11"'$  (1.803 m)   Wt 228 lb (103.4 kg)   SpO2 99%   BMI 31.80 kg/m   Opioid Risk Score:   Fall Risk Score:  `1  Depression screen PHQ 2/9  No flowsheet data found.     Review of Systems  Constitutional: Negative.   HENT: Negative.    Eyes: Negative.   Respiratory:  Positive for apnea and cough.   Cardiovascular: Negative.   Gastrointestinal: Negative.   Endocrine: Negative.   Genitourinary: Negative.   Musculoskeletal: Negative.   Skin: Negative.   Allergic/Immunologic: Negative.   Neurological:  Positive for numbness.  Hematological: Negative.   Psychiatric/Behavioral: Negative.        Objective:   Physical Exam Gen: no distress, normal appearing HEENT: oral mucosa pink and moist, NCAT Cardio: Reg rate Chest: normal effort, normal rate of breathing Abd: soft, non-distended Ext: no edema Psych: pleasant, normal affect Skin: intact Neuro: Patient is alert and oriented x3.  Demonstrates normal insight and awareness.  Cranial nerve exam is grossly intact although he is wearing his hearing aids today.  Hearing is functional.  Patient strength is 5 out of 5 in all 4 limbs.  He does have sensory loss in the ulnar distribution of the left wrist and hand.  Gait is essentially within normal range.  Reflexes are 1+. Musculoskeletal: Positive Tinel's sign at the left ulnar groove       Assessment & Plan:   Medical Problem List and Plan: 1.  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               -continue HEP. Moving well!  -may return to work 2. . Insomnia:  persistent  -melatonin trial '5mg'$  and then '10mg'$              -trazodone '100mg'$  qhs if '10mg'$  melatonin ineffective           -uses CPAP which is functioning appropriately per pt 3.   Dysphagia.  on regular diet  -small sips, bites. Needs to take time 4.  Atrial fibrillation/flutter.  Amiodarone 200 mg twice daily.   -HR controlled 5.  Hypertension.  per  primary   6. Left hand/arm numbness--persistent             -likely ulnar nerve compression at  elbow from ICU stay             -maintain extension of elbow as possible             -  elbow pad at night             -consider outpt NCS if symptoms persist. May take several more months to demonstrate improve 7. Headaches: neuropathic/migraine in quality  -given that the severe headaches are only happening every 1-2 weeks, will try rescue medication first   -'50mg'$  po imitrex prn   25 minutes of face to face patient care time were spent during this visit. All questions were encouraged and answered.  Follow up with me in 3 mos .

## 2021-07-09 NOTE — Addendum Note (Signed)
Addended by: Alger Simons T on: 07/09/2021 01:05 PM   Modules accepted: Orders

## 2021-10-01 ENCOUNTER — Encounter: Payer: Commercial Managed Care - PPO | Admitting: Physical Medicine & Rehabilitation

## 2021-12-24 ENCOUNTER — Encounter: Payer: Self-pay | Admitting: Physical Medicine & Rehabilitation

## 2021-12-24 ENCOUNTER — Encounter
Payer: Commercial Managed Care - PPO | Attending: Physical Medicine & Rehabilitation | Admitting: Physical Medicine & Rehabilitation

## 2021-12-24 ENCOUNTER — Other Ambulatory Visit: Payer: Self-pay

## 2021-12-24 VITALS — BP 127/83 | HR 57 | Ht 71.0 in | Wt 263.0 lb

## 2021-12-24 DIAGNOSIS — G001 Pneumococcal meningitis: Secondary | ICD-10-CM | POA: Diagnosis not present

## 2021-12-24 NOTE — Progress Notes (Signed)
Subjective:    Patient ID: Hayden Mcbride, male    DOB: Oct 21, 1961, 61 y.o.   MRN: [id number]  HPI  Hayden Mcbride is here in follow-up of his encephalopathy/meningitis.  I last saw him over the summer. He went to his primary who put him on nortriptyline which seemed to help headaches. He really hasn't used the imitrex.   He has noticed his hands are cold and asked if that was related to a nerve injury. He now carries gloves with him especially when he's outside.   His left ulnar hand remains numb. It perhaps has marginally improved since the summer.  His grip is still solid.   Sleep is better as a whole with nortiptyline.  He reports ongoing tingling in his posterior scalp which has been the same since he woke up from the meningitis.   Pain Inventory Average Pain 0 Pain Right Now 0 My pain is  no pain  In the last 24 hours, has pain interfered with the following? General activity 0 Relation with others 0 Enjoyment of life 0 What TIME of day is your pain at its worst? evening Sleep (in general) Fair  Pain is worse with:  no pain Pain improves with:  no pain Relief from Meds:  no pain  Family History  Problem Relation Age of Onset   Diverticulitis Mother    Colon cancer Father        in his 37's dx'd    Cancer Maternal Grandmother    Esophageal cancer Neg Hx    Stomach cancer Neg Hx    Rectal cancer Neg Hx    Colon polyps Neg Hx    Social History   Socioeconomic History   Marital status: Married    Spouse name: Not on file   Number of children: Not on file   Years of education: Not on file   Highest education level: Not on file  Occupational History   Not on file  Tobacco Use   Smoking status: Former    Years: 10.00    Types: Cigarettes    Quit date: 02/26/1985    Years since quitting: 36.8   Smokeless tobacco: Never  Vaping Use   Vaping Use: Never used  Substance and Sexual Activity   Alcohol use: Yes    Alcohol/week: 2.0 standard drinks    Types: 2 Cans of  beer per week   Drug use: No   Sexual activity: Not Currently  Other Topics Concern   Not on file  Social History Narrative   Not on file   Social Determinants of Health   Financial Resource Strain: Not on file  Food Insecurity: Not on file  Transportation Needs: Not on file  Physical Activity: Not on file  Stress: Not on file  Social Connections: Not on file   Past Surgical History:  Procedure Laterality Date   BREATH Wyman Songster PYLORI N/A 12/24/2014   Procedure: BREATH TEK Kandis Ban;  Surgeon: Alphonsa Overall, MD;  Location: [facility];  Service: General;  Laterality: N/A;   CHOLECYSTECTOMY     COLONOSCOPY     EYE SURGERY     age 19    INNER EAR SURGERY     tumor inside right ear   POLYPECTOMY     SHOULDER SURGERY     left shoulder   THYROID SURGERY     Past Surgical History:  Procedure Laterality Date   BREATH TEK H PYLORI N/A 12/24/2014   Procedure: BREATH TEK H  PYLORI;  Surgeon: Alphonsa Overall, MD;  Location: [facility];  Service: General;  Laterality: N/A;   CHOLECYSTECTOMY     COLONOSCOPY     EYE SURGERY     age 49    INNER EAR SURGERY     tumor inside right ear   POLYPECTOMY     SHOULDER SURGERY     left shoulder   THYROID SURGERY     Past Medical History:  Diagnosis Date   Allergy    Dry eyes    GERD (gastroesophageal reflux disease)    Glaucoma    Hx of adenomatous colonic polyps 07/08/2006   06/2006 - diminutive adenoma 04/19/2015 - diminutive adenoma and one polyp lost - repeat colonoscopy 2021   Hypertension    Paresthesia 07/01/2021   Post-operative nausea and vomiting    Sleep apnea    wears CPAP   Thyroid disease    long ago- half thyroid removed ~20 yrs ago    BP 127/83    Pulse (!) 57    Ht 5\' 11"  (1.803 m)    Wt 263 lb (119.3 kg)    SpO2 99%    BMI 36.68 kg/m   Opioid Risk Score:   Fall Risk Score:  `1  Depression screen PHQ 2/9  Depression screen [facility] 2/9 12/24/2021 07/09/2021  Decreased Interest 0 0  Down, Depressed, Hopeless 0 0  PHQ - 2  Score 0 0  Altered sleeping - 3  Tired, decreased energy - 1  Change in appetite - 1  Feeling bad or failure about yourself  - 0  Trouble concentrating - 0  Moving slowly or fidgety/restless - 0  Suicidal thoughts - 0  PHQ-9 Score - 5  Difficult doing work/chores - Somewhat difficult     Review of Systems     Objective:   Physical Exam  Constitutional: No distress . Vital signs reviewed. HEENT: NCAT, EOMI, oral membranes moist Neck: supple Cardiovascular: RRR without murmur. No JVD    Respiratory/Chest: CTA Bilaterally without wheezes or rales. Normal effort    GI/Abdomen: BS +, non-tender, non-distended Ext: no clubbing, cyanosis, or edema Psych: pleasant and cooperative  Skin: small scab on back of head Neuro: Patient is alert and oriented x3.  Demonstrates normal insight and awareness.  Cranial nerve exam is grossly intact although he is wearing his hearing aids today.  Hearing is functional.  Patient strength is 5 out of 5 in all 4 limbs.  He does have mild sensory loss in the ulnar distribution of the left wrist and hand.  Gait is essentially within normal range.  Reflexes are 1+. Musculoskeletal: Positive Tinel's sign at the left ulnar groove present           Assessment & Plan:    Medical Problem List and Plan: 1.  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               -continue HEP  -he continues working  -still some scalp sensitivity d/t meningitis/ might persist for awhile given history 2. . Insomnia:  persistent             -nortiptyline, no longer taking trazodone or melatonin           -uses CPAP which is functioning appropriately per pt 3.   Dysphagia.  on regular diet             -small sips, bites. Needs to take time 4.  Atrial fibrillation/flutter.  Amiodarone 200  mg twice daily.   -HR controlled 5.  Hypertension.  per primary   6. Left hand/arm numbness--persistent             -likely ulnar  nerve compression at elbow from ICU stay             -marginal improvement  -elbow pad at night, vitamin with b complex  -If progresses we can do NCS 7. Headaches: neuropathic/migraine in quality             -50mg  po imitrex prn  -nortiptyline per primary     15 minutes of face to face patient care time were spent during this visit. All questions were encouraged and answered.  Follow up with me  PRN

## 2021-12-24 NOTE — Patient Instructions (Addendum)
SLEEP WITH ELBOW EXTENDED WHEN POSSIBLE. USE AN ELBOW PAD.

## 2023-09-29 ENCOUNTER — Ambulatory Visit (INDEPENDENT_AMBULATORY_CARE_PROVIDER_SITE_OTHER): Payer: Commercial Managed Care - PPO | Admitting: Audiology

## 2023-09-29 NOTE — Progress Notes (Signed)
Patient was seen today for a hearing aid check. He reported no sound from his right Phonak Marvel hearing aid despite changing the battery and cleaning the wax filter. Replaced the 54M receiver. The patient noted improved sound quality. Connected his hearing aid to the software and set him to [employer] Contrast to help with clarity. He was happy with the changes today. Instructed him to call if any concerns arise.  Conley Rolls Cathyrn Deas, AUD, CCC-A 09/29/23

## 2024-01-27 ENCOUNTER — Other Ambulatory Visit: Payer: Self-pay | Admitting: Physical Medicine & Rehabilitation

## 2024-01-27 DIAGNOSIS — G43009 Migraine without aura, not intractable, without status migrainosus: Secondary | ICD-10-CM

## 2024-01-27 DIAGNOSIS — G001 Pneumococcal meningitis: Secondary | ICD-10-CM
